# Patient Record
Sex: Female | Born: 1965 | Race: Black or African American | Hispanic: No | Marital: Single | Smoking: Never smoker
Health system: Southern US, Community
[De-identification: ages and names within clinical notes are randomized; demographics above are authoritative.]

## PROBLEM LIST (undated history)

## (undated) DIAGNOSIS — R7303 Prediabetes: Secondary | ICD-10-CM

## (undated) DIAGNOSIS — T4145XA Adverse effect of unspecified anesthetic, initial encounter: Secondary | ICD-10-CM

## (undated) DIAGNOSIS — G4730 Sleep apnea, unspecified: Secondary | ICD-10-CM

## (undated) DIAGNOSIS — A599 Trichomoniasis, unspecified: Secondary | ICD-10-CM

## (undated) DIAGNOSIS — M1990 Unspecified osteoarthritis, unspecified site: Secondary | ICD-10-CM

## (undated) DIAGNOSIS — I272 Pulmonary hypertension, unspecified: Secondary | ICD-10-CM

## (undated) DIAGNOSIS — E785 Hyperlipidemia, unspecified: Secondary | ICD-10-CM

## (undated) DIAGNOSIS — F419 Anxiety disorder, unspecified: Secondary | ICD-10-CM

## (undated) DIAGNOSIS — T8859XA Other complications of anesthesia, initial encounter: Secondary | ICD-10-CM

## (undated) DIAGNOSIS — N183 Chronic kidney disease, stage 3 unspecified: Secondary | ICD-10-CM

## (undated) DIAGNOSIS — F32A Depression, unspecified: Secondary | ICD-10-CM

## (undated) DIAGNOSIS — I10 Essential (primary) hypertension: Secondary | ICD-10-CM

## (undated) DIAGNOSIS — I509 Heart failure, unspecified: Secondary | ICD-10-CM

## (undated) DIAGNOSIS — K219 Gastro-esophageal reflux disease without esophagitis: Secondary | ICD-10-CM

## (undated) DIAGNOSIS — Z8489 Family history of other specified conditions: Secondary | ICD-10-CM

## (undated) DIAGNOSIS — R0600 Dyspnea, unspecified: Secondary | ICD-10-CM

## (undated) DIAGNOSIS — E8809 Other disorders of plasma-protein metabolism, not elsewhere classified: Secondary | ICD-10-CM

## (undated) HISTORY — DX: Sleep apnea, unspecified: G47.30

## (undated) HISTORY — PX: OTHER SURGICAL HISTORY: SHX169

## (undated) HISTORY — DX: Trichomoniasis, unspecified: A59.9

## (undated) HISTORY — PX: CARPAL TUNNEL RELEASE: SHX101

---
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
## 1986-10-30 HISTORY — PX: LEG SURGERY: SHX1003

## 2014-02-27 DIAGNOSIS — Z8639 Personal history of other endocrine, nutritional and metabolic disease: Secondary | ICD-10-CM | POA: Insufficient documentation

## 2014-06-17 DIAGNOSIS — E8809 Other disorders of plasma-protein metabolism, not elsewhere classified: Secondary | ICD-10-CM | POA: Insufficient documentation

## 2015-02-04 DIAGNOSIS — Z5900 Homelessness unspecified: Secondary | ICD-10-CM | POA: Insufficient documentation

## 2015-07-11 DIAGNOSIS — E559 Vitamin D deficiency, unspecified: Secondary | ICD-10-CM | POA: Insufficient documentation

## 2019-09-09 ENCOUNTER — Other Ambulatory Visit: Payer: Self-pay | Admitting: Family Medicine

## 2019-09-09 DIAGNOSIS — Z1231 Encounter for screening mammogram for malignant neoplasm of breast: Secondary | ICD-10-CM

## 2019-10-01 ENCOUNTER — Other Ambulatory Visit: Payer: Self-pay | Admitting: Gastroenterology

## 2019-10-29 ENCOUNTER — Inpatient Hospital Stay (HOSPITAL_COMMUNITY)
Admission: EM | Admit: 2019-10-29 | Discharge: 2019-11-03 | DRG: 177 | Disposition: A | Discharge status: Home / Self Care | Payer: Medicare Other | Attending: Internal Medicine | Admitting: Internal Medicine

## 2019-10-29 ENCOUNTER — Encounter (HOSPITAL_COMMUNITY): Payer: Self-pay

## 2019-10-29 ENCOUNTER — Emergency Department (HOSPITAL_COMMUNITY): Payer: Medicare Other

## 2019-10-29 ENCOUNTER — Other Ambulatory Visit: Payer: Self-pay

## 2019-10-29 DIAGNOSIS — J9601 Acute respiratory failure with hypoxia: Secondary | ICD-10-CM | POA: Diagnosis present

## 2019-10-29 DIAGNOSIS — M109 Gout, unspecified: Secondary | ICD-10-CM | POA: Diagnosis present

## 2019-10-29 DIAGNOSIS — U071 COVID-19: Secondary | ICD-10-CM | POA: Diagnosis not present

## 2019-10-29 DIAGNOSIS — Z9981 Dependence on supplemental oxygen: Secondary | ICD-10-CM

## 2019-10-29 DIAGNOSIS — N183 Chronic kidney disease, stage 3 unspecified: Secondary | ICD-10-CM | POA: Diagnosis present

## 2019-10-29 DIAGNOSIS — I509 Heart failure, unspecified: Secondary | ICD-10-CM

## 2019-10-29 DIAGNOSIS — I272 Pulmonary hypertension, unspecified: Secondary | ICD-10-CM | POA: Diagnosis present

## 2019-10-29 DIAGNOSIS — R0602 Shortness of breath: Secondary | ICD-10-CM | POA: Diagnosis not present

## 2019-10-29 DIAGNOSIS — J1282 Pneumonia due to coronavirus disease 2019: Secondary | ICD-10-CM | POA: Diagnosis present

## 2019-10-29 DIAGNOSIS — J9600 Acute respiratory failure, unspecified whether with hypoxia or hypercapnia: Secondary | ICD-10-CM | POA: Diagnosis present

## 2019-10-29 DIAGNOSIS — Z87891 Personal history of nicotine dependence: Secondary | ICD-10-CM

## 2019-10-29 DIAGNOSIS — Z91048 Other nonmedicinal substance allergy status: Secondary | ICD-10-CM

## 2019-10-29 DIAGNOSIS — Z888 Allergy status to other drugs, medicaments and biological substances status: Secondary | ICD-10-CM

## 2019-10-29 DIAGNOSIS — J9621 Acute and chronic respiratory failure with hypoxia: Secondary | ICD-10-CM | POA: Diagnosis present

## 2019-10-29 DIAGNOSIS — N1832 Chronic kidney disease, stage 3b: Secondary | ICD-10-CM | POA: Diagnosis present

## 2019-10-29 DIAGNOSIS — Z825 Family history of asthma and other chronic lower respiratory diseases: Secondary | ICD-10-CM

## 2019-10-29 DIAGNOSIS — I130 Hypertensive heart and chronic kidney disease with heart failure and stage 1 through stage 4 chronic kidney disease, or unspecified chronic kidney disease: Secondary | ICD-10-CM | POA: Diagnosis present

## 2019-10-29 DIAGNOSIS — Z86711 Personal history of pulmonary embolism: Secondary | ICD-10-CM

## 2019-10-29 DIAGNOSIS — Z6841 Body Mass Index (BMI) 40.0 and over, adult: Secondary | ICD-10-CM

## 2019-10-29 HISTORY — DX: Pulmonary hypertension, unspecified: I27.20

## 2019-10-29 HISTORY — DX: Essential (primary) hypertension: I10

## 2019-10-29 HISTORY — DX: Chronic kidney disease, stage 3 unspecified: N18.30

## 2019-10-29 LAB — CBC WITH DIFFERENTIAL/PLATELET
Abs Immature Granulocytes: 0.02 10*3/uL (ref 0.00–0.07)
Basophils Absolute: 0 10*3/uL (ref 0.0–0.1)
Basophils Relative: 1 %
Eosinophils Absolute: 0.1 10*3/uL (ref 0.0–0.5)
Eosinophils Relative: 3 %
HCT: 48.5 % — ABNORMAL HIGH (ref 36.0–46.0)
Hemoglobin: 15.1 g/dL — ABNORMAL HIGH (ref 12.0–15.0)
Immature Granulocytes: 1 %
Lymphocytes Relative: 25 %
Lymphs Abs: 0.9 10*3/uL (ref 0.7–4.0)
MCH: 30.3 pg (ref 26.0–34.0)
MCHC: 31.1 g/dL (ref 30.0–36.0)
MCV: 97.4 fL (ref 80.0–100.0)
Monocytes Absolute: 0.6 10*3/uL (ref 0.1–1.0)
Monocytes Relative: 18 %
Neutro Abs: 1.8 10*3/uL (ref 1.7–7.7)
Neutrophils Relative %: 52 %
Platelets: 190 10*3/uL (ref 150–400)
RBC: 4.98 MIL/uL (ref 3.87–5.11)
RDW: 16.2 % — ABNORMAL HIGH (ref 11.5–15.5)
WBC: 3.4 10*3/uL — ABNORMAL LOW (ref 4.0–10.5)
nRBC: 0 % (ref 0.0–0.2)

## 2019-10-29 LAB — COMPREHENSIVE METABOLIC PANEL
ALT: 13 U/L (ref 0–44)
AST: 17 U/L (ref 15–41)
Albumin: 3.2 g/dL — ABNORMAL LOW (ref 3.5–5.0)
Alkaline Phosphatase: 90 U/L (ref 38–126)
Anion gap: 10 (ref 5–15)
BUN: 24 mg/dL — ABNORMAL HIGH (ref 6–20)
CO2: 23 mmol/L (ref 22–32)
Calcium: 9.1 mg/dL (ref 8.9–10.3)
Chloride: 108 mmol/L (ref 98–111)
Creatinine, Ser: 1.71 mg/dL — ABNORMAL HIGH (ref 0.44–1.00)
GFR calc Af Amer: 39 mL/min — ABNORMAL LOW (ref >60)
GFR calc non Af Amer: 34 mL/min — ABNORMAL LOW (ref >60)
Glucose, Bld: 99 mg/dL (ref 70–99)
Potassium: 3.8 mmol/L (ref 3.5–5.1)
Sodium: 141 mmol/L (ref 135–145)
Total Bilirubin: 0.3 mg/dL (ref 0.3–1.2)
Total Protein: 6.2 g/dL — ABNORMAL LOW (ref 6.5–8.1)

## 2019-10-29 LAB — I-STAT BETA HCG BLOOD, ED (MC, WL, AP ONLY): I-stat hCG, quantitative: <5 m[IU]/mL (ref <5)

## 2019-10-29 LAB — TROPONIN I (HIGH SENSITIVITY)
Troponin I (High Sensitivity): 10 ng/L (ref <18)
Troponin I (High Sensitivity): 6 ng/L (ref <18)

## 2019-10-29 LAB — TRIGLYCERIDES: Triglycerides: 137 mg/dL (ref <150)

## 2019-10-29 LAB — LACTIC ACID, PLASMA: Lactic Acid, Venous: 0.9 mmol/L (ref 0.5–1.9)

## 2019-10-29 LAB — FIBRINOGEN: Fibrinogen: 412 mg/dL (ref 210–475)

## 2019-10-29 LAB — PROCALCITONIN: Procalcitonin: <0.1 ng/mL

## 2019-10-29 LAB — POC SARS CORONAVIRUS 2 AG -  ED: SARS Coronavirus 2 Ag: NEGATIVE

## 2019-10-29 LAB — D-DIMER, QUANTITATIVE: D-Dimer, Quant: 1.49 ug/mL-FEU — ABNORMAL HIGH (ref 0.00–0.50)

## 2019-10-29 LAB — LACTATE DEHYDROGENASE: LDH: 202 U/L — ABNORMAL HIGH (ref 98–192)

## 2019-10-29 MED ORDER — ALBUTEROL SULFATE (2.5 MG/3ML) 0.083% IN NEBU
5.0000 mg | INHALATION_SOLUTION | Freq: Once | RESPIRATORY_TRACT | Status: AC
  Administered 2019-10-29: 5 mg via RESPIRATORY_TRACT
  Filled 2019-10-29: qty 6

## 2019-10-29 MED ORDER — FENTANYL CITRATE (PF) 100 MCG/2ML IJ SOLN
50.0000 ug | Freq: Once | INTRAMUSCULAR | Status: AC
  Administered 2019-10-29: 50 ug via INTRAVENOUS
  Filled 2019-10-29: qty 2

## 2019-10-29 MED ORDER — DEXAMETHASONE SODIUM PHOSPHATE 10 MG/ML IJ SOLN
6.0000 mg | Freq: Once | INTRAMUSCULAR | Status: AC
  Administered 2019-10-29: 6 mg via INTRAVENOUS
  Filled 2019-10-29: qty 1

## 2019-10-29 MED ORDER — FUROSEMIDE 10 MG/ML IJ SOLN
40.0000 mg | Freq: Once | INTRAMUSCULAR | Status: AC
  Administered 2019-10-29: 40 mg via INTRAVENOUS
  Filled 2019-10-29: qty 4

## 2019-10-29 NOTE — ED Provider Notes (Signed)
[place] [facility] EMERGENCY DEPARTMENT Provider Note   CSN: [id number] Arrival date & time: 10/29/19  1647     History Chief Complaint  Patient presents with  . Shortness of Breath    Dawn Fowler is a 53 y.o. female.  Patient with a history of pulmonary hypertension and restrictive lung disease on home oxygen 4 L at baseline 8 L with exertion, hypertension, chronic kidney disease here with 2 days of shortness of breath, malaise, fatigue, cough and body aches.  States she had an outpatient Covid test today that was positive.  Has had sick contacts at home.  She does have oxygen at home but states the canister that she was using was about to run out.  She recently relocated and does not have a pulmonologist in the area.  She feels like her breathing is at baseline but her biggest complaint is fatigue with body aches and myalgias.  Denies fever.  Has a cough productive of white mucus and some streaks of blood.  Denies any leg pain or leg swelling.  No abdominal pain, vomiting, diarrhea.  No chest pain.  Does have some tingling in her left arm which has been fairly constant for several days.  No focal weakness.  The history is provided by the patient.  Shortness of Breath Associated symptoms: no abdominal pain, no chest pain, no cough, no fever, no headaches, no rash and no vomiting        Past Medical History:  Diagnosis Date  . CKD (chronic kidney disease), stage III   . HTN (hypertension)   . Pulmonary HTN ([place])     There are no problems to display for this patient.   History reviewed. No pertinent surgical history.   OB History   No obstetric history on file.     History reviewed. No pertinent family history.  Social History   Tobacco Use  . Smoking status: Former Research scientist (life sciences)  . Smokeless tobacco: Never Used  Substance Use Topics  . Alcohol use: Not Currently  . Drug use: Not Currently    Home Medications Prior to Admission medications   Medication  Sig Start Date End Date Taking? Authorizing Provider  allopurinol (ZYLOPRIM) 100 MG tablet Take 100 mg by mouth daily.    [provider]  ALYQ 20 MG tablet Take 20 mg by mouth daily. 09/24/19   [provider]  Cholecalciferol (VITAMIN D-3) 125 MCG (5000 UT) TABS Take 5,000 mg by mouth daily.    [provider]  colchicine 0.6 MG tablet Take 0.6 mg by mouth as needed (Gout).    [provider]  furosemide (LASIX) 40 MG tablet Take 40 mg by mouth 2 (two) times daily. 10/07/19   [provider]  NIFEdipine (PROCARDIA XL/NIFEDICAL XL) 60 MG 24 hr tablet Take 60 mg by mouth daily.    [provider]  OPSUMIT 10 MG tablet Take 10 mg by mouth daily. 09/22/19   [provider]  PREMARIN 0.625 MG tablet Take 0.625 mg by mouth daily. 09/01/19   [provider]  sildenafil (REVATIO) 20 MG tablet Take 20 mg by mouth daily.    [provider]  telmisartan (MICARDIS) 40 MG tablet Take 20 mg by mouth daily. 10/07/19   [provider]  tiotropium (SPIRIVA) 18 MCG inhalation capsule Place 18 mcg into inhaler and inhale daily.    [provider]    Allergies    Adhesive [tape] and Succinylcholine  Review of Systems  Review of Systems  Constitutional: Positive for activity change, appetite change and fatigue. Negative for fever.  HENT: Positive for congestion and rhinorrhea.   Respiratory: Positive for shortness of breath. Negative for cough and chest tightness.   Cardiovascular: Negative for chest pain and leg swelling.  Gastrointestinal: Negative for abdominal pain, nausea and vomiting.  Genitourinary: Negative for dysuria.  Musculoskeletal: Positive for arthralgias and myalgias.  Skin: Negative for rash.  Neurological: Positive for weakness. Negative for dizziness, light-headedness and headaches.   all other systems are negative except as noted in the HPI and PMH.    Physical Exam Updated Vital  Signs BP (!) 168/105 (BP Location: Right Arm)   Pulse 64   Temp 98.7 F (37.1 C) (Oral)   Resp 20   Ht _0  (1.549 m)   Wt 111.6 kg   SpO2 96%   BMI 46.48 kg/m   Physical Exam Vitals and nursing note reviewed.  Constitutional:      General: She is not in acute distress.    Appearance: She is well-developed. She is obese. She is not ill-appearing.     Comments: Appears comfortable, no significant respiratory distress, mild dyspnea with conversation  HENT:     Head: Normocephalic and atraumatic.     Mouth/Throat:     Pharynx: No oropharyngeal exudate.  Eyes:     Conjunctiva/sclera: Conjunctivae normal.     Pupils: Pupils are equal, round, and reactive to light.  Neck:     Comments: No meningismus. Cardiovascular:     Rate and Rhythm: Normal rate and regular rhythm.     Heart sounds: Normal heart sounds. No murmur.  Pulmonary:     Effort: Pulmonary effort is normal. No respiratory distress.     Breath sounds: Normal breath sounds. No wheezing.     Comments: Diminished breath sounds bilaterally Chest:     Chest wall: No tenderness.  Abdominal:     Palpations: Abdomen is soft.     Tenderness: There is no abdominal tenderness. There is no guarding or rebound.  Musculoskeletal:        General: No tenderness. Normal range of motion.     Cervical back: Normal range of motion and neck supple.  Skin:    General: Skin is warm.     Capillary Refill: Capillary refill takes less than 2 seconds.  Neurological:     General: No focal deficit present.     Mental Status: She is alert and oriented to person, place, and time. Mental status is at baseline.     Cranial Nerves: No cranial nerve deficit.     Motor: No abnormal muscle tone.     Coordination: Coordination normal.     Comments: No ataxia on finger to nose bilaterally. No pronator drift. 5/5 strength throughout. CN 2-12 intact.Equal grip strength. Sensation intact.   Psychiatric:        Behavior: Behavior normal.         ED Results / Procedures / Treatments   Labs (all labs ordered are listed, but only abnormal results are displayed) Labs Reviewed  CBC WITH DIFFERENTIAL/PLATELET - Abnormal; Notable for the following components:      Result Value   WBC 3.4 (*)    Hemoglobin 15.1 (*)    HCT 48.5 (*)    RDW 16.2 (*)    All other components within normal limits  COMPREHENSIVE METABOLIC PANEL - Abnormal; Notable for the following components:   BUN 24 (*)    Creatinine, Ser 1.71 (*)  Total Protein 6.2 (*)    Albumin 3.2 (*)    GFR calc non Af Amer 34 (*)    GFR calc Af Amer 39 (*)    All other components within normal limits  D-DIMER, QUANTITATIVE (NOT AT [facility]) - Abnormal; Notable for the following components:   D-Dimer, Quant 1.49 (*)    All other components within normal limits  LACTATE DEHYDROGENASE - Abnormal; Notable for the following components:   LDH 202 (*)    All other components within normal limits  CULTURE, BLOOD (ROUTINE X 2)  CULTURE, BLOOD (ROUTINE X 2)  LACTIC ACID, PLASMA  PROCALCITONIN  FIBRINOGEN  TRIGLYCERIDES  I-STAT BETA HCG BLOOD, ED (MC, [facility], AP ONLY)  POC SARS CORONAVIRUS 2 AG -  ED  TROPONIN I (HIGH SENSITIVITY)  TROPONIN I (HIGH SENSITIVITY)    EKG EKG Interpretation  Date/Time:  Wednesday October 29 2019 16:56:46 EST Ventricular Rate:  62 PR Interval:  166 QRS Duration: 88 QT Interval:  426 QTC Calculation: 432 R Axis:   55 Text Interpretation: Normal sinus rhythm Possible Anterior infarct , age undetermined Abnormal ECG No previous ECGs available Confirmed by Ezequiel Essex [phone]) on 10/29/2019 8:10:59 PM   Radiology DG Chest Port 1 View  Result Date: 10/29/2019 CLINICAL DATA:  Shortness of breath EXAM: PORTABLE CHEST 1 VIEW COMPARISON:  None. FINDINGS: There is significant cardiomegaly. There are no large focal infiltrates. There is vascular congestion without overt pulmonary edema. The pulmonary arteries are dilated. There is no  pneumothorax. No significant pleural effusion. No acute osseous abnormality. IMPRESSION: 1. Marked cardiomegaly with vascular congestion. 2. Dilated pulmonary arteries which can be seen in patients with elevated pulmonary artery pressures. 3. No large focal infiltrate.  No pleural effusion or pneumothorax. Electronically Signed   By: Constance Holster M.D.   On: 10/29/2019 19:21    Procedures .Critical Care Performed by: Ezequiel Essex, MD Authorized by: Ezequiel Essex, MD   Critical care provider statement:    Critical care time (minutes):  35   Critical care was necessary to treat or prevent imminent or life-threatening deterioration of the following conditions:  Respiratory failure   Critical care was time spent personally by me on the following activities:  Discussions with consultants, evaluation of patient's response to treatment, examination of patient, ordering and performing treatments and interventions, ordering and review of laboratory studies, ordering and review of radiographic studies, pulse oximetry, re-evaluation of patient's condition, obtaining history from patient or surrogate and review of old charts   (including critical care time)  Medications Ordered in ED Medications  albuterol (PROVENTIL) (2.5 MG/3ML) 0.083% nebulizer solution 5 mg (5 mg Nebulization Given 10/29/19 1905)  fentaNYL (SUBLIMAZE) injection 50 mcg (50 mcg Intravenous Given 10/29/19 2109)  furosemide (LASIX) injection 40 mg (40 mg Intravenous Given 10/29/19 2103)  dexamethasone (DECADRON) injection 6 mg (6 mg Intravenous Given 10/29/19 2101)    ED Course  I have reviewed the triage vital signs and the nursing notes.  Pertinent labs & imaging results that were available during my care of the patient were reviewed by me and considered in my medical decision making (see chart for details).  Clinical Course as of Oct 29 99  Wed Oct 29, 2019  2312 POC SARS Coronavirus 2 Ag-ED - Nasal Swab (BD  Veritor Kit) [RD]    Clinical Course User Index [RD] Lucrezia Starch, MD   MDM Rules/Calculators/A&P  Patient with interstitial lung disease and pulmonary hypertension on home oxygen with known coronavirus presenting with shortness of breath, fatigue, body aches and generalized weakness for the past 2 days.  She is saturating 89% on her usual 4 L.  She increased to 8 L with exertion.  She has mild dyspnea with conversation.  Diminished breath sounds with crackles.  Chest x-ray shows cardiomegaly with vascular congestion.  States compliance with her 40 mg of Lasix daily.  Will she get IV Lasix as well as IV Decadron given her known coronavirus test.  Patient does become quite dyspneic with ambulation been and hypoxic despite her home oxygen.  Did desaturate to 79% on her home 8 L with ambulation.  At rest she is mildly dyspneic and appears to be somewhat short of breath but not hypoxic.  She is given Lasix as well as Decadron for presumed multifactorial respiratory failure due to coronavirus as well as some CHF.  Inflammatory markers are reassuring. D-dimer 1.49> creatinine 1.7, unknown baseline. Will avoid IV contrast and CTPE at this time given her alternative diagnoses and mildly elevated D-dimer.   Patient agreeable to admission.  D/w D. Hal Hope   Hessie Dibble was evaluated in Emergency Department on 10/29/2019 for the symptoms described in the history of present illness. She was evaluated in the context of the global COVID-19 pandemic, which necessitated consideration that the patient might be at risk for infection with the SARS-CoV-2 virus that causes COVID-19. Institutional protocols and algorithms that pertain to the evaluation of patients at risk for COVID-19 are in a state of rapid change based on information released by regulatory bodies including the CDC and federal and state organizations. These policies and algorithms were followed during the  patient's care in the ED.  Final Clinical Impression(s) / ED Diagnoses Final diagnoses:  Acute respiratory failure with hypoxia ([place])  Acute congestive heart failure, unspecified heart failure type [facility])    Rx / DC Orders ED Discharge Orders    None       Oberon Hehir, Annie Main, MD 10/30/19 0102

## 2019-10-29 NOTE — ED Triage Notes (Signed)
Pt BIB [facility] for eval of COVID +, SOB, malaise x 2 days. Pt wears home O2 @ 4-8 LPM exertionally dependent. EMS reports pt denies acute complaints, but reports home O2 ran out and they called EMS for that reason. Pt on O2 tank on arrival.

## 2019-10-29 NOTE — ED Notes (Signed)
Pt. Started with 92% sitting in the bed prior to ambulation. Once started walking SpO2 and 6 liters of O2, sat's ranged from 94-100%, with a few seconds of 91%.  Walked back and forth multiple times, sat's stayed the same throughout.  When pt. Stopped walking and sat back on bed, sat's dropped down to 79% with good Pleth wave, turned O2 back up to 8L, and waited a few minutes until her sat's got back to 94%.  Pt. Is very exhausted and very short of breath, but turned O2 back down to 6L, pt remains at 94-96%.

## 2019-10-30 ENCOUNTER — Other Ambulatory Visit: Payer: Self-pay

## 2019-10-30 ENCOUNTER — Encounter (HOSPITAL_COMMUNITY): Payer: Self-pay | Admitting: Internal Medicine

## 2019-10-30 DIAGNOSIS — Z86711 Personal history of pulmonary embolism: Secondary | ICD-10-CM | POA: Diagnosis not present

## 2019-10-30 DIAGNOSIS — N183 Chronic kidney disease, stage 3 unspecified: Secondary | ICD-10-CM | POA: Diagnosis present

## 2019-10-30 DIAGNOSIS — I130 Hypertensive heart and chronic kidney disease with heart failure and stage 1 through stage 4 chronic kidney disease, or unspecified chronic kidney disease: Secondary | ICD-10-CM | POA: Diagnosis present

## 2019-10-30 DIAGNOSIS — Z825 Family history of asthma and other chronic lower respiratory diseases: Secondary | ICD-10-CM | POA: Diagnosis not present

## 2019-10-30 DIAGNOSIS — M109 Gout, unspecified: Secondary | ICD-10-CM | POA: Diagnosis present

## 2019-10-30 DIAGNOSIS — J9601 Acute respiratory failure with hypoxia: Secondary | ICD-10-CM | POA: Insufficient documentation

## 2019-10-30 DIAGNOSIS — U071 COVID-19: Secondary | ICD-10-CM | POA: Diagnosis present

## 2019-10-30 DIAGNOSIS — I509 Heart failure, unspecified: Secondary | ICD-10-CM | POA: Diagnosis present

## 2019-10-30 DIAGNOSIS — Z91048 Other nonmedicinal substance allergy status: Secondary | ICD-10-CM | POA: Diagnosis not present

## 2019-10-30 DIAGNOSIS — N1832 Chronic kidney disease, stage 3b: Secondary | ICD-10-CM | POA: Diagnosis present

## 2019-10-30 DIAGNOSIS — J1282 Pneumonia due to coronavirus disease 2019: Secondary | ICD-10-CM | POA: Diagnosis present

## 2019-10-30 DIAGNOSIS — Z9981 Dependence on supplemental oxygen: Secondary | ICD-10-CM | POA: Diagnosis not present

## 2019-10-30 DIAGNOSIS — Z87891 Personal history of nicotine dependence: Secondary | ICD-10-CM | POA: Diagnosis not present

## 2019-10-30 DIAGNOSIS — J9621 Acute and chronic respiratory failure with hypoxia: Secondary | ICD-10-CM | POA: Diagnosis present

## 2019-10-30 DIAGNOSIS — J9600 Acute respiratory failure, unspecified whether with hypoxia or hypercapnia: Secondary | ICD-10-CM | POA: Diagnosis not present

## 2019-10-30 DIAGNOSIS — Z888 Allergy status to other drugs, medicaments and biological substances status: Secondary | ICD-10-CM | POA: Diagnosis not present

## 2019-10-30 DIAGNOSIS — R0602 Shortness of breath: Secondary | ICD-10-CM | POA: Diagnosis present

## 2019-10-30 DIAGNOSIS — I272 Pulmonary hypertension, unspecified: Secondary | ICD-10-CM | POA: Diagnosis present

## 2019-10-30 DIAGNOSIS — Z6841 Body Mass Index (BMI) 40.0 and over, adult: Secondary | ICD-10-CM | POA: Diagnosis not present

## 2019-10-30 HISTORY — DX: COVID-19: U07.1

## 2019-10-30 HISTORY — DX: Acute respiratory failure, unspecified whether with hypoxia or hypercapnia: J96.00

## 2019-10-30 LAB — COMPREHENSIVE METABOLIC PANEL
ALT: 15 U/L (ref 0–44)
ALT: 16 U/L (ref 0–44)
AST: 19 U/L (ref 15–41)
AST: 18 U/L (ref 15–41)
Albumin: 3.5 g/dL (ref 3.5–5.0)
Albumin: 3.7 g/dL (ref 3.5–5.0)
Alkaline Phosphatase: 100 U/L (ref 38–126)
Alkaline Phosphatase: 96 U/L (ref 38–126)
Anion gap: 12 (ref 5–15)
Anion gap: 14 (ref 5–15)
BUN: 27 mg/dL — ABNORMAL HIGH (ref 6–20)
BUN: 28 mg/dL — ABNORMAL HIGH (ref 6–20)
CO2: 22 mmol/L (ref 22–32)
CO2: 23 mmol/L (ref 22–32)
Calcium: 9.4 mg/dL (ref 8.9–10.3)
Calcium: 9.2 mg/dL (ref 8.9–10.3)
Chloride: 106 mmol/L (ref 98–111)
Chloride: 103 mmol/L (ref 98–111)
Creatinine, Ser: 1.58 mg/dL — ABNORMAL HIGH (ref 0.44–1.00)
Creatinine, Ser: 1.52 mg/dL — ABNORMAL HIGH (ref 0.44–1.00)
GFR calc Af Amer: 43 mL/min — ABNORMAL LOW (ref >60)
GFR calc Af Amer: 45 mL/min — ABNORMAL LOW (ref >60)
GFR calc non Af Amer: 37 mL/min — ABNORMAL LOW (ref >60)
GFR calc non Af Amer: 39 mL/min — ABNORMAL LOW (ref >60)
Glucose, Bld: 171 mg/dL — ABNORMAL HIGH (ref 70–99)
Glucose, Bld: 191 mg/dL — ABNORMAL HIGH (ref 70–99)
Potassium: 4 mmol/L (ref 3.5–5.1)
Potassium: 3.9 mmol/L (ref 3.5–5.1)
Sodium: 140 mmol/L (ref 135–145)
Sodium: 140 mmol/L (ref 135–145)
Total Bilirubin: 0.2 mg/dL — ABNORMAL LOW (ref 0.3–1.2)
Total Bilirubin: 0.5 mg/dL (ref 0.3–1.2)
Total Protein: 6.9 g/dL (ref 6.5–8.1)
Total Protein: 7.2 g/dL (ref 6.5–8.1)

## 2019-10-30 LAB — TROPONIN I (HIGH SENSITIVITY)
Troponin I (High Sensitivity): 7 ng/L (ref <18)
Troponin I (High Sensitivity): 6 ng/L (ref <18)

## 2019-10-30 LAB — CBC WITH DIFFERENTIAL/PLATELET
Abs Immature Granulocytes: 0.01 10*3/uL (ref 0.00–0.07)
Abs Immature Granulocytes: 0.01 10*3/uL (ref 0.00–0.07)
Basophils Absolute: 0 10*3/uL (ref 0.0–0.1)
Basophils Absolute: 0 10*3/uL (ref 0.0–0.1)
Basophils Relative: 1 %
Basophils Relative: 1 %
Eosinophils Absolute: 0 10*3/uL (ref 0.0–0.5)
Eosinophils Absolute: 0 10*3/uL (ref 0.0–0.5)
Eosinophils Relative: 0 %
Eosinophils Relative: 0 %
HCT: 51.2 % — ABNORMAL HIGH (ref 36.0–46.0)
HCT: 48.8 % — ABNORMAL HIGH (ref 36.0–46.0)
Hemoglobin: 16.2 g/dL — ABNORMAL HIGH (ref 12.0–15.0)
Hemoglobin: 15.2 g/dL — ABNORMAL HIGH (ref 12.0–15.0)
Immature Granulocytes: 0 %
Immature Granulocytes: 0 %
Lymphocytes Relative: 14 %
Lymphocytes Relative: 24 %
Lymphs Abs: 0.6 10*3/uL — ABNORMAL LOW (ref 0.7–4.0)
Lymphs Abs: 0.7 10*3/uL (ref 0.7–4.0)
MCH: 30.2 pg (ref 26.0–34.0)
MCH: 30 pg (ref 26.0–34.0)
MCHC: 31.6 g/dL (ref 30.0–36.0)
MCHC: 31.1 g/dL (ref 30.0–36.0)
MCV: 95.5 fL (ref 80.0–100.0)
MCV: 96.3 fL (ref 80.0–100.0)
Monocytes Absolute: 0.1 10*3/uL (ref 0.1–1.0)
Monocytes Absolute: 0.1 10*3/uL (ref 0.1–1.0)
Monocytes Relative: 2 %
Monocytes Relative: 4 %
Neutro Abs: 3.4 10*3/uL (ref 1.7–7.7)
Neutro Abs: 2.1 10*3/uL (ref 1.7–7.7)
Neutrophils Relative %: 83 %
Neutrophils Relative %: 71 %
Platelets: 196 10*3/uL (ref 150–400)
Platelets: 212 10*3/uL (ref 150–400)
RBC: 5.36 MIL/uL — ABNORMAL HIGH (ref 3.87–5.11)
RBC: 5.07 MIL/uL (ref 3.87–5.11)
RDW: 16.2 % — ABNORMAL HIGH (ref 11.5–15.5)
RDW: 16.3 % — ABNORMAL HIGH (ref 11.5–15.5)
WBC: 4.1 10*3/uL (ref 4.0–10.5)
WBC: 2.9 10*3/uL — ABNORMAL LOW (ref 4.0–10.5)
nRBC: 0 % (ref 0.0–0.2)
nRBC: 0 % (ref 0.0–0.2)

## 2019-10-30 LAB — HIV ANTIBODY (ROUTINE TESTING W REFLEX): HIV Screen 4th Generation wRfx: NONREACTIVE

## 2019-10-30 LAB — C-REACTIVE PROTEIN
CRP: 2.3 mg/dL — ABNORMAL HIGH (ref <1.0)
CRP: 2.3 mg/dL — ABNORMAL HIGH (ref <1.0)

## 2019-10-30 LAB — FERRITIN: Ferritin: 83 ng/mL (ref 11–307)

## 2019-10-30 LAB — ABO/RH: ABO/RH(D): A POS

## 2019-10-30 LAB — BRAIN NATRIURETIC PEPTIDE: B Natriuretic Peptide: 102.3 pg/mL — ABNORMAL HIGH (ref 0.0–100.0)

## 2019-10-30 LAB — D-DIMER, QUANTITATIVE: D-Dimer, Quant: 1.59 ug/mL-FEU — ABNORMAL HIGH (ref 0.00–0.50)

## 2019-10-30 MED ORDER — FUROSEMIDE 20 MG PO TABS
40.0000 mg | ORAL_TABLET | Freq: Two times a day (BID) | ORAL | Status: DC
  Administered 2019-10-30 – 2019-11-03 (×9): 40 mg via ORAL
  Filled 2019-10-30 (×9): qty 2

## 2019-10-30 MED ORDER — ENOXAPARIN SODIUM 40 MG/0.4ML ~~LOC~~ SOLN
40.0000 mg | Freq: Every day | SUBCUTANEOUS | Status: DC
  Administered 2019-10-30: 40 mg via SUBCUTANEOUS
  Filled 2019-10-30: qty 0.4

## 2019-10-30 MED ORDER — IRBESARTAN 75 MG PO TABS
75.0000 mg | ORAL_TABLET | Freq: Every day | ORAL | Status: DC
  Administered 2019-10-30 – 2019-11-03 (×5): 75 mg via ORAL
  Filled 2019-10-30: qty 0.5
  Filled 2019-10-30 (×4): qty 1

## 2019-10-30 MED ORDER — DEXAMETHASONE SODIUM PHOSPHATE 10 MG/ML IJ SOLN
6.0000 mg | INTRAMUSCULAR | Status: DC
  Administered 2019-10-30 – 2019-11-01 (×3): 6 mg via INTRAVENOUS
  Filled 2019-10-30 (×3): qty 1

## 2019-10-30 MED ORDER — ENOXAPARIN SODIUM 60 MG/0.6ML ~~LOC~~ SOLN
0.5000 mg/kg | Freq: Every day | SUBCUTANEOUS | Status: DC
  Administered 2019-10-31 – 2019-11-03 (×4): 55 mg via SUBCUTANEOUS
  Filled 2019-10-30 (×4): qty 0.6

## 2019-10-30 MED ORDER — TRAMADOL HCL 50 MG PO TABS
50.0000 mg | ORAL_TABLET | Freq: Four times a day (QID) | ORAL | Status: DC | PRN
  Administered 2019-10-30: 50 mg via ORAL
  Filled 2019-10-30: qty 1

## 2019-10-30 MED ORDER — MACITENTAN 10 MG PO TABS
10.0000 mg | ORAL_TABLET | Freq: Every day | ORAL | Status: DC
  Administered 2019-10-30 – 2019-11-03 (×5): 10 mg via ORAL
  Filled 2019-10-30 (×5): qty 1

## 2019-10-30 MED ORDER — SODIUM CHLORIDE 0.9 % IV SOLN
100.0000 mg | Freq: Every day | INTRAVENOUS | Status: AC
  Administered 2019-10-31 – 2019-11-03 (×4): 100 mg via INTRAVENOUS
  Filled 2019-10-30 (×4): qty 20

## 2019-10-30 MED ORDER — SILDENAFIL CITRATE 20 MG PO TABS: 20.0000 mg | ORAL_TABLET | Freq: Every day | ORAL | Status: DC

## 2019-10-30 MED ORDER — SILDENAFIL CITRATE 20 MG PO TABS
20.0000 mg | ORAL_TABLET | Freq: Every day | ORAL | Status: DC
  Administered 2019-10-30 – 2019-11-03 (×5): 20 mg via ORAL
  Filled 2019-10-30 (×5): qty 1

## 2019-10-30 MED ORDER — ACETAMINOPHEN 325 MG PO TABS
650.0000 mg | ORAL_TABLET | Freq: Four times a day (QID) | ORAL | Status: DC | PRN
  Administered 2019-10-30 – 2019-10-31 (×2): 650 mg via ORAL
  Filled 2019-10-30 (×2): qty 2

## 2019-10-30 MED ORDER — ALLOPURINOL 100 MG PO TABS
100.0000 mg | ORAL_TABLET | Freq: Every day | ORAL | Status: DC
  Administered 2019-10-30 – 2019-11-03 (×5): 100 mg via ORAL
  Filled 2019-10-30 (×5): qty 1

## 2019-10-30 MED ORDER — UMECLIDINIUM BROMIDE 62.5 MCG/INH IN AEPB
1.0000 | INHALATION_SPRAY | Freq: Every day | RESPIRATORY_TRACT | Status: DC
  Administered 2019-10-30 – 2019-11-03 (×5): 1 via RESPIRATORY_TRACT
  Filled 2019-10-30: qty 7

## 2019-10-30 MED ORDER — NIFEDIPINE ER OSMOTIC RELEASE 60 MG PO TB24
60.0000 mg | ORAL_TABLET | Freq: Every day | ORAL | Status: DC
  Administered 2019-10-30 – 2019-11-03 (×5): 60 mg via ORAL
  Filled 2019-10-30 (×5): qty 1

## 2019-10-30 MED ORDER — SODIUM CHLORIDE 0.9 % IV SOLN
200.0000 mg | Freq: Once | INTRAVENOUS | Status: AC
  Administered 2019-10-30: 200 mg via INTRAVENOUS
  Filled 2019-10-30: qty 40

## 2019-10-30 MED ORDER — TADALAFIL (PAH) 20 MG PO TABS: 20.0000 mg | ORAL_TABLET | Freq: Every day | ORAL | Status: DC

## 2019-10-30 MED ORDER — TIOTROPIUM BROMIDE MONOHYDRATE 18 MCG IN CAPS: 18.0000 ug | ORAL_CAPSULE | Freq: Every day | RESPIRATORY_TRACT | Status: DC

## 2019-10-30 MED ORDER — VITAMIN D 25 MCG (1000 UNIT) PO TABS
5000.0000 [IU] | ORAL_TABLET | Freq: Every day | ORAL | Status: DC
  Administered 2019-10-30 – 2019-11-03 (×5): 5000 [IU] via ORAL
  Filled 2019-10-30 (×5): qty 5

## 2019-10-30 MED ORDER — ACETAMINOPHEN 650 MG RE SUPP: 650.0000 mg | Freq: Four times a day (QID) | RECTAL | Status: DC | PRN

## 2019-10-30 NOTE — H&P (Signed)
History and Physical    Dawn Fowler OJJ:[id number] DOB: May 18, 1966 DOA: 10/29/2019  PCP: Hayden Rasmussen, MD  Patient coming from: Home.  Chief Complaint: Shortness of breath.  HPI: Dawn Fowler is a 53 y.o. female with history of pulmonary hypertension, hypertension, interstitial lung disease chronic kidney disease stage III morbid obesity presents to the ER with complaints of increasing shortness of breath.  Patient states her shortness of breath worsened over the last 1 week and has been taking more rest than usual.  Denies any chest pain has been having some nonproductive cough has been a subjective feeling of fever chills.  No nausea vomiting or diarrhea.  Patient usually requires 4 L oxygen on rest and 8 L at exertion.  Patient just recently moved to [facility] and usually gets her care from [facility].  ED Course: In the ER patient was hypoxic at 8 L chest x-ray showing congestion and patient had Covid test done which was positive results of which are available on ER physicians notes.  Patient had labs drawn which shows BNP of 102.3 LDH 202 CRP 2.3 ferritin 83 creatinine 1.7 WBC 3.4.  Patient was given IV Lasix and started on IV remdesivir and Decadron admitted for acute respiratory failure.  Review of Systems: As per HPI, rest all negative.   Past Medical History:  Diagnosis Date  . CKD (chronic kidney disease), stage III   . HTN (hypertension)   . Pulmonary HTN ([place])     Past Surgical History:  Procedure Laterality Date  . CESAREAN SECTION    . lumbar surgery       reports that she has quit smoking. She has never used smokeless tobacco. She reports previous alcohol use. She reports previous drug use.  Allergies  Allergen Reactions  . Adhesive [Tape] Hives    Paper tape  . Succinylcholine Other (See Comments)    Have trouble waking up  . Tizanidine Hives    Family History  Problem Relation Age of Onset  . COPD Father   . Pulmonary Hypertension Neg Hx      Prior to Admission medications   Medication Sig Start Date End Date Taking? Authorizing Provider  allopurinol (ZYLOPRIM) 100 MG tablet Take 100 mg by mouth daily.   Yes [provider]  ALYQ 20 MG tablet Take 20 mg by mouth daily. 09/24/19  Yes [provider]  Cholecalciferol (VITAMIN D-3) 125 MCG (5000 UT) TABS Take 5,000 mg by mouth daily.   Yes [provider]  colchicine 0.6 MG tablet Take 0.6 mg by mouth as needed (Gout).   Yes [provider]  diclofenac Sodium (VOLTAREN) 1 % GEL Apply 2 g topically at bedtime. 10/20/19  Yes [provider]  furosemide (LASIX) 40 MG tablet Take 40 mg by mouth 2 (two) times daily. 10/07/19  Yes [provider]  NIFEdipine (PROCARDIA XL/NIFEDICAL XL) 60 MG 24 hr tablet Take 60 mg by mouth daily.   Yes [provider]  OPSUMIT 10 MG tablet Take 10 mg by mouth daily. 09/22/19  Yes [provider]  PREMARIN 0.625 MG tablet Take 0.625 mg by mouth daily. 09/01/19  Yes [provider]  sildenafil (REVATIO) 20 MG tablet Take 20 mg by mouth daily.   Yes [provider]  telmisartan (MICARDIS) 40 MG tablet Take 20 mg by mouth daily. 10/07/19  Yes [provider]  tiotropium (SPIRIVA) 18 MCG inhalation capsule Place 18 mcg into inhaler and inhale daily.   Yes [provider]  traMADol (ULTRAM) 50 MG tablet Take 50 mg by mouth every 6 (six) hours as needed for moderate pain.  10/20/19  Yes [provider]    Physical Exam: Constitutional: Moderately built and nourished. Vitals:   10/30/19 0030 10/30/19 0045 10/30/19 0100 10/30/19 0115  BP:      Pulse: 66 62 60 60  Resp: 18 20 20 19   Temp:      TempSrc:      SpO2: (!) 89% (!) 88% (!) 87% 90%  Weight:      Height:       Eyes: Anicteric no pallor. ENMT: No discharge from the ears eyes nose or mouth. Neck: No mass felt.  No neck rigidity. Respiratory: No rhonchi or  crepitations. Cardiovascular: S1-S2 heard. Abdomen: Soft nontender bowel sounds present. Musculoskeletal: No edema. Skin: No rash. Neurologic: Alert awake oriented time place and person.  Moves all extremities. Psychiatric: Appears normal per normal affect.   Labs on Admission: I have personally reviewed following labs and imaging studies  CBC: Recent Labs  Lab 10/29/19 1843  WBC 3.4*  NEUTROABS 1.8  HGB 15.1*  HCT 48.5*  MCV 97.4  PLT 785   Basic Metabolic Panel: Recent Labs  Lab 10/29/19 1843  NA 141  K 3.8  CL 108  CO2 23  GLUCOSE 99  BUN 24*  CREATININE 1.71*  CALCIUM 9.1   GFR: Estimated Creatinine Clearance: 44.5 mL/min (A) (by C-G formula based on SCr of 1.71 mg/dL (H)). Liver Function Tests: Recent Labs  Lab 10/29/19 1843  AST 17  ALT 13  ALKPHOS 90  BILITOT 0.3  PROT 6.2*  ALBUMIN 3.2*   No results for input(s): LIPASE, AMYLASE in the last 168 hours. No results for input(s): AMMONIA in the last 168 hours. Coagulation Profile: No results for input(s): INR, PROTIME in the last 168 hours. Cardiac Enzymes: No results for input(s): CKTOTAL, CKMB, CKMBINDEX, TROPONINI in the last 168 hours. BNP (last 3 results) No results for input(s): PROBNP in the last 8760 hours. HbA1C: No results for input(s): HGBA1C in the last 72 hours. CBG: No results for input(s): GLUCAP in the last 168 hours. Lipid Profile: Recent Labs    10/29/19 1843  TRIG 137   Thyroid Function Tests: No results for input(s): TSH, T4TOTAL, FREET4, T3FREE, THYROIDAB in the last 72 hours. Anemia Panel: No results for input(s): VITAMINB12, FOLATE, FERRITIN, TIBC, IRON, RETICCTPCT in the last 72 hours. Urine analysis: No results found for: COLORURINE, APPEARANCEUR, LABSPEC, PHURINE, GLUCOSEU, HGBUR, BILIRUBINUR, KETONESUR, PROTEINUR, UROBILINOGEN, NITRITE, LEUKOCYTESUR Sepsis Labs: @LABRCNTIP (procalcitonin:4,lacticidven:4) )No results found for this or any previous visit (from the  past 240 hour(s)).   Radiological Exams on Admission: DG Chest Port 1 View  Result Date: 10/29/2019 CLINICAL DATA:  Shortness of breath EXAM: PORTABLE CHEST 1 VIEW COMPARISON:  None. FINDINGS: There is significant cardiomegaly. There are no large focal infiltrates. There is vascular congestion without overt pulmonary edema. The pulmonary arteries are dilated. There is no pneumothorax. No significant pleural effusion. No acute osseous abnormality. IMPRESSION: 1. Marked cardiomegaly with vascular congestion. 2. Dilated pulmonary arteries which can be seen in patients with elevated pulmonary artery pressures. 3. No large focal infiltrate.  No pleural effusion or pneumothorax. Electronically Signed   By: Constance Holster M.D.   On: 10/29/2019 19:21    EKG: Independently reviewed.  Normal sinus rhythm with low voltage.  Assessment/Plan Principal Problem:   Acute respiratory failure due to COVID-19 [facility]) Active Problems:   Pulmonary hypertension (  [place])   Gout   CKD (chronic kidney disease) stage 3, GFR 30-59 ml/min    1. Acute respiratory failure with hypoxia likely a combination of patient's known history of pulmonary hypertension and Covid infection.  For which I have started patient IV Decadron IV remdesivir and did receive 1 dose of IV Lasix.  We will continue home dose of Lasix continue to monitor closely in stepdown unit follow respiratory status and inflammatory markers. 2. History of pulmonary hypertension on Lasix Alyq Opsumit. 3. Hypertension on Procardia and ARB. 4. History of gout on allopurinol.  5. History of provoked PE in 2016 after long distance travel.  Note that patient's COVID-19 infection results are only available on the ER physician's note.  Given that patient has acute respiratory failure will need inpatient status.   DVT prophylaxis: Lovenox. Code Status: Full code. Family Communication: Discussed with patient. Disposition Plan: Home. Consults called:  None. Admission status: Inpatient.   Rise Patience MD [facility] Pager [phone].  If 7PM-7AM, please contact night-coverage [email or web address] Password [email or web address]  10/30/2019, 1:20 AM

## 2019-10-30 NOTE — ED Notes (Signed)
[facility] called for pt to go to [facility]

## 2019-10-30 NOTE — Progress Notes (Signed)
Admitted earlier today.  H&P reviewed.  Patient seen and examined.  Patient mentions that she is feeling better.  She however did feel short of breath when she was ambulating.  She mentions that she uses up to 8 L of oxygen at home for her history of pulmonary hypertension.  Has been referred by her PCP to [facility] Pulmonology but has not seen anybody yet.  She recently moved here from [facility].  She was being followed by pulmonology at the [facility] there.   Vital signs noted to be stable.  She is currently on 6 L of oxygen saturating in the mid 90s.  Crackles heard bilateral lung bases.  No wheezing or rhonchi. S1-S2 is normal regular Abdomen is soft nontender nondistended She is alert and oriented x3.  No focal neurological deficit  Labs reviewed.  Troponin normal.  Leukopenia is noted.  CRP was 2.3.  Patient is COVID-19 positive test result is inside the ED provider note.  It was apparently done at an urgent care center.  Patient with COVID-19 and with worse respiratory symptoms than at baseline.  She does have a history of pulmonary hypertension and is on oxygen at home.  Chest x-ray did not show any clear-cut infiltrates however clinically she was thought to have pneumonia.  Patient was started on remdesivir.  She states that she is feeling well.  Slightly better than how she felt last night.  Continue with current management for now.  Continue with steroids.  Since patient is improving and is not requiring any more oxygen than what she usually uses at home we will not offer her experimental treatment with Actemra or convalescent plasma.  Continue with incentive spirometry, mobilization, out of bed to chair.  Rest of the issues as per H&P.  Bonnielee Haff 10/30/2019

## 2019-10-30 NOTE — ED Notes (Signed)
Report called to Larene Beach, Therapist, sports at [facility]

## 2019-10-31 LAB — COMPREHENSIVE METABOLIC PANEL
ALT: 14 U/L (ref 0–44)
AST: 17 U/L (ref 15–41)
Albumin: 3.1 g/dL — ABNORMAL LOW (ref 3.5–5.0)
Alkaline Phosphatase: 89 U/L (ref 38–126)
Anion gap: 11 (ref 5–15)
BUN: 32 mg/dL — ABNORMAL HIGH (ref 6–20)
CO2: 23 mmol/L (ref 22–32)
Calcium: 9.2 mg/dL (ref 8.9–10.3)
Chloride: 107 mmol/L (ref 98–111)
Creatinine, Ser: 1.38 mg/dL — ABNORMAL HIGH (ref 0.44–1.00)
GFR calc Af Amer: 51 mL/min — ABNORMAL LOW (ref >60)
GFR calc non Af Amer: 44 mL/min — ABNORMAL LOW (ref >60)
Glucose, Bld: 82 mg/dL (ref 70–99)
Potassium: 3.6 mmol/L (ref 3.5–5.1)
Sodium: 141 mmol/L (ref 135–145)
Total Bilirubin: 0.3 mg/dL (ref 0.3–1.2)
Total Protein: 6.6 g/dL (ref 6.5–8.1)

## 2019-10-31 LAB — CBC
HCT: 47.6 % — ABNORMAL HIGH (ref 36.0–46.0)
Hemoglobin: 15.2 g/dL — ABNORMAL HIGH (ref 12.0–15.0)
MCH: 30.1 pg (ref 26.0–34.0)
MCHC: 31.9 g/dL (ref 30.0–36.0)
MCV: 94.3 fL (ref 80.0–100.0)
Platelets: 241 10*3/uL (ref 150–400)
RBC: 5.05 MIL/uL (ref 3.87–5.11)
RDW: 16.1 % — ABNORMAL HIGH (ref 11.5–15.5)
WBC: 4.1 10*3/uL (ref 4.0–10.5)
nRBC: 0 % (ref 0.0–0.2)

## 2019-10-31 LAB — C-REACTIVE PROTEIN: CRP: 0.9 mg/dL (ref <1.0)

## 2019-10-31 LAB — MAGNESIUM: Magnesium: 2 mg/dL (ref 1.7–2.4)

## 2019-10-31 LAB — D-DIMER, QUANTITATIVE: D-Dimer, Quant: 1.07 ug/mL-FEU — ABNORMAL HIGH (ref 0.00–0.50)

## 2019-10-31 LAB — BRAIN NATRIURETIC PEPTIDE: B Natriuretic Peptide: 41.3 pg/mL (ref 0.0–100.0)

## 2019-10-31 MED ORDER — DIPHENHYDRAMINE HCL 12.5 MG/5ML PO ELIX
12.5000 mg | ORAL_SOLUTION | Freq: Four times a day (QID) | ORAL | Status: DC | PRN
  Administered 2019-10-31 – 2019-11-01 (×2): 12.5 mg via ORAL
  Filled 2019-10-31 (×3): qty 5

## 2019-10-31 NOTE — Progress Notes (Addendum)
PROGRESS NOTE                                                                                                                                                                                                             Patient Demographics:    Dawn Fowler, is a 54 y.o. female, DOB - Nov 08, 1965, OVF:[id number]  Outpatient Primary MD for the patient is Hayden Rasmussen, MD    LOS - 1  Admit date - 10/29/2019    Chief Complaint  Patient presents with  . Shortness of Breath       Brief Narrative   Dawn Fowler is a 54 y.o. female with history of pulmonary hypertension, hypertension, interstitial lung disease chronic kidney disease stage III morbid obesity presents to the ER with complaints of increasing shortness of breath, she was diagnosed with acute on chronic hypoxic respiratory failure due to COVID-19 pneumonia and admitted to the hospital.   Subjective:    Dawn Fowler today has, No headache, No chest pain, No abdominal pain - No Nausea, No new weakness tingling or numbness, mild SOB.   Assessment  & Plan :     1. Acute Hypoxic Resp. Failure due to Acute Covid 19 Viral Pneumonitis during the ongoing 2020 Covid 19 Pandemic -she has mild to moderate disease and seems to be stabilizing on IV steroids and remdesivir which will be continued.  Will monitor her closely.  Try to advance activity and titrate down oxygen as tolerated.  Her oxygen requirements are at or less than home requirements.  Encouraged the patient to sit up in chair in the daytime use I-S and flutter valve for pulmonary toiletry and then prone in bed when at night.  Actemra off label use - patient was told that if COVID-19 pneumonitis gets worse we might potentially use Actemra off label, patient denies any known history of tuberculosis or hepatitis, understands the risks and benefits and wants to proceed with Actemra treatment if required.     SpO2: 94 % O2 Flow Rate (L/min): 4 L/min  Recent Labs  Lab 10/29/19 1843 10/30/19 0050 10/30/19 0209 10/30/19 0550 10/31/19 0647  CRP  --  2.3* 2.3*  --  0.9  DDIMER 1.49*  --   --  1.59* 1.07*  FERRITIN  --   --  83  --   --  BNP  --   --  102.3*  --  41.3  PROCALCITON <0.10  --   --   --   --     Hepatic Function Latest Ref Rng & Units 10/31/2019 10/30/2019 10/30/2019  Total Protein 6.5 - 8.1 g/dL 6.6 7.2 6.9  Albumin 3.5 - 5.0 g/dL 3.1(L) 3.7 3.5  AST 15 - 41 U/L _0 ALT 0 - 44 U/L _1 Alk Phosphatase 38 - 126 U/L 89 96 100  Total Bilirubin 0.3 - 1.2 mg/dL 0.3 0.5 0.2(L)     2. History of pulmonary hypertension on Lasix & Opsumit.  Note she uses 4 to 5 L of nasal cannula oxygen at home.  3. Hypertension on Procardia and ARB.  4. History of gout on allopurinol.   5. History of provoked PE in 2016 after long distance travel.  Currently on prophylactic Lovenox.     Condition - Fair  Family Communication  :  None  Code Status : Full  Diet :   Diet Order            Diet Heart Room service appropriate? Yes; Fluid consistency: Thin; Fluid restriction: 1200 mL Fluid  Diet effective now               Disposition Plan  : Likely home in a few days  Consults  : None  Procedures  : None  PUD Prophylaxis : None  DVT Prophylaxis  :  Lovenox   Lab Results  Component Value Date   PLT 241 10/31/2019    Inpatient Medications  Scheduled Meds: . allopurinol  100 mg Oral Daily  . cholecalciferol  5,000 Units Oral Daily  . dexamethasone (DECADRON) injection  6 mg Intravenous Q24H  . enoxaparin (LOVENOX) injection  0.5 mg/kg Subcutaneous Daily  . furosemide  40 mg Oral BID  . irbesartan  75 mg Oral Daily  . macitentan  10 mg Oral Daily  . NIFEdipine  60 mg Oral Daily  . sildenafil  20 mg Oral Daily  . umeclidinium bromide  1 puff Inhalation Daily   Continuous Infusions: . remdesivir 100 mg in NS 100 mL 100 mg (10/31/19 1025)   PRN  Meds:.acetaminophen **OR** acetaminophen, diphenhydrAMINE, traMADol  Antibiotics  :    Anti-infectives (From admission, onward)   Start     Dose/Rate Route Frequency Ordered Stop   10/31/19 1000  remdesivir 100 mg in sodium chloride 0.9 % 100 mL IVPB     100 mg 200 mL/hr over 30 Minutes Intravenous Daily 10/30/19 0119 11/04/19 0959   10/30/19 0200  remdesivir 200 mg in sodium chloride 0.9% 250 mL IVPB     200 mg 580 mL/hr over 30 Minutes Intravenous Once 10/30/19 0119 10/30/19 0305       Time Spent in minutes  30   Lala Lund M.D on 10/31/2019 at 12:04 PM  To page go to [email or web address] - password [facility]  [facility] -  Office  [phone]   See all Orders from today for further details    Objective:   Vitals:   10/31/19 0000 10/31/19 0400 10/31/19 0731 10/31/19 1115  BP: (!) 155/102 (!) 153/96 (!) 136/111 (!) 154/111  Pulse: 70 (!) 58 (!) 57 67  Resp: _2 Temp: 98 F (36.7 C) 98.3 F (36.8 C) 97.7 F (36.5 C) (!) 97.5 F (36.4 C)  TempSrc:   Oral Oral  SpO2: 95% 92% 90% 94%  Weight:  Height:        Wt Readings from Last 3 Encounters:  10/29/19 111.6 kg     Intake/Output Summary (Last 24 hours) at 10/31/2019 1204 Last data filed at 10/31/2019 1153 Gross per 24 hour  Intake 705.2 ml  Output 2 ml  Net 703.2 ml     Physical Exam  Awake Alert, No new F.N deficits, Normal affect [place].AT,PERRAL Supple Neck,No JVD, No cervical lymphadenopathy appriciated.  Symmetrical Chest wall movement, Good air movement bilaterally, CTAB RRR,No Gallops,Rubs or new Murmurs, No Parasternal Heave +ve B.Sounds, Abd Soft, No tenderness, No organomegaly appriciated, No rebound - guarding or rigidity. No Cyanosis, Clubbing or edema, No new Rash or bruise      Data Review:    CBC Recent Labs  Lab 10/29/19 1843 10/30/19 0209 10/30/19 0550 10/31/19 0647  WBC 3.4* 4.1 2.9* 4.1  HGB 15.1* 16.2* 15.2* 15.2*  HCT 48.5* 51.2* 48.8* 47.6*  PLT 190 196 212 241   MCV 97.4 95.5 96.3 94.3  MCH 30.3 30.2 30.0 30.1  MCHC 31.1 31.6 31.1 31.9  RDW 16.2* 16.2* 16.3* 16.1*  LYMPHSABS 0.9 0.6* 0.7  --   MONOABS 0.6 0.1 0.1  --   EOSABS 0.1 0.0 0.0  --   BASOSABS 0.0 0.0 0.0  --     Chemistries  Recent Labs  Lab 10/29/19 1843 10/30/19 0209 10/30/19 0550 10/31/19 0647  NA 141 140 140 141  K 3.8 4.0 3.9 3.6  CL 108 106 103 107  CO2 _0 GLUCOSE 99 171* 191* 82  BUN 24* 27* 28* 32*  CREATININE 1.71* 1.58* 1.52* 1.38*  CALCIUM 9.1 9.4 9.2 9.2  MG  --   --   --  2.0  AST _1 ALT _2 ALKPHOS 90 100 96 89  BILITOT 0.3 0.2* 0.5 0.3   ------------------------------------------------------------------------------------------------------------------ Recent Labs    10/29/19 1843  TRIG 137    No results found for: HGBA1C ------------------------------------------------------------------------------------------------------------------ No results for input(s): TSH, T4TOTAL, T3FREE, THYROIDAB in the last 72 hours.  Invalid input(s): FREET3  Cardiac Enzymes No results for input(s): CKMB, TROPONINI, MYOGLOBIN in the last 168 hours.  Invalid input(s): CK ------------------------------------------------------------------------------------------------------------------    Component Value Date/Time   BNP 41.3 10/31/2019 0647    Micro Results Recent Results (from the past 240 hour(s))  Blood Culture (routine x 2)     Status: None (Preliminary result)   Collection Time: 10/29/19  7:24 PM   Specimen: BLOOD  Result Value Ref Range Status   Specimen Description BLOOD RIGHT ANTECUBITAL  Final   Special Requests   Final    BOTTLES DRAWN AEROBIC AND ANAEROBIC Blood Culture adequate volume   Culture   Final    NO GROWTH < 24 HOURS Performed at [facility], [address], [place], [place] [address]    Report Status PENDING  Incomplete  Blood Culture (routine x 2)     Status: None (Preliminary result)   Collection  Time: 10/29/19  9:06 PM   Specimen: BLOOD  Result Value Ref Range Status   Specimen Description BLOOD LEFT FOREARM  Final   Special Requests   Final    BOTTLES DRAWN AEROBIC AND ANAEROBIC Blood Culture adequate volume   Culture   Final    NO GROWTH < 24 HOURS Performed at [facility], [place] [address] [place], [place] [address]    Report Status PENDING  Incomplete  Radiology Reports DG Chest Port 1 View  Result Date: 10/29/2019 CLINICAL DATA:  Shortness of breath EXAM: PORTABLE CHEST 1 VIEW COMPARISON:  None. FINDINGS: There is significant cardiomegaly. There are no large focal infiltrates. There is vascular congestion without overt pulmonary edema. The pulmonary arteries are dilated. There is no pneumothorax. No significant pleural effusion. No acute osseous abnormality. IMPRESSION: 1. Marked cardiomegaly with vascular congestion. 2. Dilated pulmonary arteries which can be seen in patients with elevated pulmonary artery pressures. 3. No large focal infiltrate.  No pleural effusion or pneumothorax. Electronically Signed   By: Constance Holster M.D.   On: 10/29/2019 19:21

## 2019-11-01 LAB — CBC WITH DIFFERENTIAL/PLATELET
Abs Immature Granulocytes: 0.02 10*3/uL (ref 0.00–0.07)
Basophils Absolute: 0 10*3/uL (ref 0.0–0.1)
Basophils Relative: 0 %
Eosinophils Absolute: 0 10*3/uL (ref 0.0–0.5)
Eosinophils Relative: 0 %
HCT: 51.6 % — ABNORMAL HIGH (ref 36.0–46.0)
Hemoglobin: 16.5 g/dL — ABNORMAL HIGH (ref 12.0–15.0)
Immature Granulocytes: 0 %
Lymphocytes Relative: 39 %
Lymphs Abs: 2.2 10*3/uL (ref 0.7–4.0)
MCH: 30.2 pg (ref 26.0–34.0)
MCHC: 32 g/dL (ref 30.0–36.0)
MCV: 94.5 fL (ref 80.0–100.0)
Monocytes Absolute: 0.4 10*3/uL (ref 0.1–1.0)
Monocytes Relative: 7 %
Neutro Abs: 3 10*3/uL (ref 1.7–7.7)
Neutrophils Relative %: 54 %
Platelets: 241 10*3/uL (ref 150–400)
RBC: 5.46 MIL/uL — ABNORMAL HIGH (ref 3.87–5.11)
RDW: 16.1 % — ABNORMAL HIGH (ref 11.5–15.5)
WBC: 5.7 10*3/uL (ref 4.0–10.5)
nRBC: 0 % (ref 0.0–0.2)

## 2019-11-01 LAB — COMPREHENSIVE METABOLIC PANEL
ALT: 16 U/L (ref 0–44)
AST: 17 U/L (ref 15–41)
Albumin: 3.5 g/dL (ref 3.5–5.0)
Alkaline Phosphatase: 92 U/L (ref 38–126)
Anion gap: 12 (ref 5–15)
BUN: 34 mg/dL — ABNORMAL HIGH (ref 6–20)
CO2: 21 mmol/L — ABNORMAL LOW (ref 22–32)
Calcium: 9 mg/dL (ref 8.9–10.3)
Chloride: 104 mmol/L (ref 98–111)
Creatinine, Ser: 1.31 mg/dL — ABNORMAL HIGH (ref 0.44–1.00)
GFR calc Af Amer: 54 mL/min — ABNORMAL LOW (ref >60)
GFR calc non Af Amer: 47 mL/min — ABNORMAL LOW (ref >60)
Glucose, Bld: 89 mg/dL (ref 70–99)
Potassium: 3.7 mmol/L (ref 3.5–5.1)
Sodium: 137 mmol/L (ref 135–145)
Total Bilirubin: 0.4 mg/dL (ref 0.3–1.2)
Total Protein: 6.7 g/dL (ref 6.5–8.1)

## 2019-11-01 LAB — C-REACTIVE PROTEIN: CRP: 0.7 mg/dL (ref <1.0)

## 2019-11-01 LAB — MAGNESIUM: Magnesium: 2.2 mg/dL (ref 1.7–2.4)

## 2019-11-01 LAB — D-DIMER, QUANTITATIVE: D-Dimer, Quant: 0.71 ug/mL-FEU — ABNORMAL HIGH (ref 0.00–0.50)

## 2019-11-01 LAB — BRAIN NATRIURETIC PEPTIDE: B Natriuretic Peptide: 36.8 pg/mL (ref 0.0–100.0)

## 2019-11-01 MED ORDER — DEXAMETHASONE SODIUM PHOSPHATE 4 MG/ML IJ SOLN: 3.0000 mg | INTRAMUSCULAR | Status: DC

## 2019-11-01 NOTE — Progress Notes (Signed)
PROGRESS NOTE                                                                                                                                                                                                             Patient Demographics:    Dawn Fowler, is a 54 y.o. female, DOB - 29-Jun-1966, [id number]  Outpatient Primary MD for the patient is Hayden Rasmussen, MD    LOS - 2  Admit date - 10/29/2019    Chief Complaint  Patient presents with  . Shortness of Breath       Brief Narrative   Dawn Fowler is a 54 y.o. female with history of pulmonary hypertension, hypertension, interstitial lung disease chronic kidney disease stage III morbid obesity presents to the ER with complaints of increasing shortness of breath, she was diagnosed with acute on chronic hypoxic respiratory failure due to COVID-19 pneumonia and admitted to the hospital.   Subjective:   Patient in bed, appears comfortable, denies any headache, no fever, no chest pain or pressure, no shortness of breath , no abdominal pain. No focal weakness.   Assessment  & Plan :     1. Acute Hypoxic Resp. Failure due to Acute Covid 19 Viral Pneumonitis during the ongoing 2020 Covid 19 Pandemic -she has mild to moderate disease and seems to have responded very well to steroids and remdesivir, and now less than home oxygen requirement and in no distress, start tapering down steroids and finished remdesivir course.  Try to advance activity and titrate down oxygen as tolerated.  Note patient uses 8 L of oxygen at home?  Encouraged the patient to sit up in chair in the daytime use I-S and flutter valve for pulmonary toiletry and then prone in bed when at night.   SpO2: 96 % O2 Flow Rate (L/min): 2 L/min  Recent Labs  Lab 10/29/19 1843 10/30/19 0050 10/30/19 0209 10/30/19 0550 10/31/19 0647 11/01/19 0648  CRP  --  2.3* 2.3*  --  0.9 0.7  DDIMER 1.49*  --    --  1.59* 1.07* 0.71*  FERRITIN  --   --  83  --   --   --   BNP  --   --  102.3*  --  41.3 36.8  PROCALCITON <0.10  --   --   --   --   --  Hepatic Function Latest Ref Rng & Units 11/01/2019 10/31/2019 10/30/2019  Total Protein 6.5 - 8.1 g/dL 6.7 6.6 7.2  Albumin 3.5 - 5.0 g/dL 3.5 3.1(L) 3.7  AST 15 - 41 U/L '17 17 18  '$ ALT 0 - 44 U/L '16 14 16  '$ Alk Phosphatase 38 - 126 U/L 92 89 96  Total Bilirubin 0.3 - 1.2 mg/dL 0.4 0.3 0.5     2. History of pulmonary hypertension on Lasix & Opsumit.  Note she uses 4 to 5 L of nasal cannula oxygen at home.  3. Hypertension on Procardia and ARB.  4. History of gout on allopurinol.   5. History of provoked PE in 2016 after long distance travel.  Currently on prophylactic Lovenox.     Condition - Fair  Family Communication  :  None  Code Status : Full  Diet :   Diet Order            Diet Heart Room service appropriate? Yes; Fluid consistency: Thin; Fluid restriction: 1200 mL Fluid  Diet effective now               Disposition Plan  : Likely home in 2 days  Consults  : None  Procedures  : None  PUD Prophylaxis : None  DVT Prophylaxis  :  Lovenox   Lab Results  Component Value Date   PLT 241 11/01/2019    Inpatient Medications  Scheduled Meds: . allopurinol  100 mg Oral Daily  . cholecalciferol  5,000 Units Oral Daily  . [START ON 11/02/2019] dexamethasone (DECADRON) injection  3 mg Intravenous Q24H  . enoxaparin (LOVENOX) injection  0.5 mg/kg Subcutaneous Daily  . furosemide  40 mg Oral BID  . irbesartan  75 mg Oral Daily  . macitentan  10 mg Oral Daily  . NIFEdipine  60 mg Oral Daily  . sildenafil  20 mg Oral Daily  . umeclidinium bromide  1 puff Inhalation Daily   Continuous Infusions: . remdesivir 100 mg in NS 100 mL Stopped (10/31/19 1145)   PRN Meds:.acetaminophen **OR** acetaminophen, diphenhydrAMINE, traMADol  Antibiotics  :    Anti-infectives (From admission, onward)   Start     Dose/Rate Route  Frequency Ordered Stop   10/31/19 1000  remdesivir 100 mg in sodium chloride 0.9 % 100 mL IVPB     100 mg 200 mL/hr over 30 Minutes Intravenous Daily 10/30/19 0119 11/04/19 0959   10/30/19 0200  remdesivir 200 mg in sodium chloride 0.9% 250 mL IVPB     200 mg 580 mL/hr over 30 Minutes Intravenous Once 10/30/19 0119 10/30/19 0305       Time Spent in minutes  30   Lala Lund M.D on 11/01/2019 at 10:03 AM  To page go to [email or web address] - password [email or web address]  [facility] -  Office  [phone]   See all Orders from today for further details    Objective:   Vitals:   10/31/19 1925 11/01/19 0021 11/01/19 0410 11/01/19 0812  BP: (!) 143/88 (!) 152/102 (!) 152/109 (!) 171/125  Pulse: 79 64 (!) 59 (!) 54  Resp: (!) 21 (!) '21 15 18  '$ Temp: 97.6 F (36.4 C) 97.9 F (36.6 C) 98.1 F (36.7 C) 97.7 F (36.5 C)  TempSrc:  Oral  Oral  SpO2: 94% 98% 95% 96%  Weight:      Height:        Wt Readings from Last 3 Encounters:  10/29/19 111.6 kg     Intake/Output Summary (Last  24 hours) at 11/01/2019 1003 Last data filed at 10/31/2019 1505 Gross per 24 hour  Intake 945.2 ml  Output -  Net 945.2 ml     Physical Exam  Awake Alert,  No new F.N deficits, Normal affect [place].AT,PERRAL Supple Neck,No JVD, No cervical lymphadenopathy appriciated.  Symmetrical Chest wall movement, Good air movement bilaterally, CTAB RRR,No Gallops, Rubs or new Murmurs, No Parasternal Heave +ve B.Sounds, Abd Soft, No tenderness, No organomegaly appriciated, No rebound - guarding or rigidity. No Cyanosis, Clubbing or edema, No new Rash or bruise   Data Review:    CBC Recent Labs  Lab 10/29/19 1843 10/30/19 0209 10/30/19 0550 10/31/19 0647 11/01/19 0648  WBC 3.4* 4.1 2.9* 4.1 5.7  HGB 15.1* 16.2* 15.2* 15.2* 16.5*  HCT 48.5* 51.2* 48.8* 47.6* 51.6*  PLT 190 196 212 241 241  MCV 97.4 95.5 96.3 94.3 94.5  MCH 30.3 30.2 30.0 30.1 30.2  MCHC 31.1 31.6 31.1 31.9 32.0  RDW 16.2* 16.2* 16.3* 16.1*  16.1*  LYMPHSABS 0.9 0.6* 0.7  --  2.2  MONOABS 0.6 0.1 0.1  --  0.4  EOSABS 0.1 0.0 0.0  --  0.0  BASOSABS 0.0 0.0 0.0  --  0.0    Chemistries  Recent Labs  Lab 10/29/19 1843 10/30/19 0209 10/30/19 0550 10/31/19 0647 11/01/19 0648  NA 141 140 140 141 137  K 3.8 4.0 3.9 3.6 3.7  CL 108 106 103 107 104  CO2 '23 22 23 23 '$ 21*  GLUCOSE 99 171* 191* 82 89  BUN 24* 27* 28* 32* 34*  CREATININE 1.71* 1.58* 1.52* 1.38* 1.31*  CALCIUM 9.1 9.4 9.2 9.2 9.0  MG  --   --   --  2.0 2.2  AST '17 19 18 17 17  '$ ALT '13 15 16 14 16  '$ ALKPHOS 90 100 96 89 92  BILITOT 0.3 0.2* 0.5 0.3 0.4   ------------------------------------------------------------------------------------------------------------------ Recent Labs    10/29/19 1843  TRIG 137    No results found for: HGBA1C ------------------------------------------------------------------------------------------------------------------ No results for input(s): TSH, T4TOTAL, T3FREE, THYROIDAB in the last 72 hours.  Invalid input(s): FREET3  Cardiac Enzymes No results for input(s): CKMB, TROPONINI, MYOGLOBIN in the last 168 hours.  Invalid input(s): CK ------------------------------------------------------------------------------------------------------------------    Component Value Date/Time   BNP 36.8 11/01/2019 0240    Micro Results Recent Results (from the past 240 hour(s))  Blood Culture (routine x 2)     Status: None (Preliminary result)   Collection Time: 10/29/19  7:24 PM   Specimen: BLOOD  Result Value Ref Range Status   Specimen Description BLOOD RIGHT ANTECUBITAL  Final   Special Requests   Final    BOTTLES DRAWN AEROBIC AND ANAEROBIC Blood Culture adequate volume   Culture   Final    NO GROWTH 3 DAYS Performed at [facility], [address] [place], [place] [address]    Report Status PENDING  Incomplete  Blood Culture (routine x 2)     Status: None (Preliminary result)   Collection Time: 10/29/19  9:06 PM    Specimen: BLOOD  Result Value Ref Range Status   Specimen Description BLOOD LEFT FOREARM  Final   Special Requests   Final    BOTTLES DRAWN AEROBIC AND ANAEROBIC Blood Culture adequate volume   Culture   Final    NO GROWTH 3 DAYS Performed at [facility], [place] [address] [place], [place] [address]    Report Status PENDING  Incomplete  Radiology Reports DG Chest Port 1 View  Result Date: 10/29/2019 CLINICAL DATA:  Shortness of breath EXAM: PORTABLE CHEST 1 VIEW COMPARISON:  None. FINDINGS: There is significant cardiomegaly. There are no large focal infiltrates. There is vascular congestion without overt pulmonary edema. The pulmonary arteries are dilated. There is no pneumothorax. No significant pleural effusion. No acute osseous abnormality. IMPRESSION: 1. Marked cardiomegaly with vascular congestion. 2. Dilated pulmonary arteries which can be seen in patients with elevated pulmonary artery pressures. 3. No large focal infiltrate.  No pleural effusion or pneumothorax. Electronically Signed   By: Constance Holster M.D.   On: 10/29/2019 19:21

## 2019-11-01 NOTE — Progress Notes (Signed)
SATURATION QUALIFICATIONS: (This note is used to comply with regulatory documentation for home oxygen)  Patient Saturations on Room Air at Rest = 80%  Patient Saturations on Room Air while Ambulating = N/A  Patient Saturations on 2 Liters of oxygen while Ambulating = 85-92%  Please briefly explain why patient needs home oxygen: Patient desaturates quickly without oxygen at rest. While ambulating, the patient maintained an O2 saturation between 85-92% while on 2Lpm via [place]. We walked one lap around the unit and the patient did not have any difficulty breathing however she did require one brief pause because she was feeling short of breath. Patient normally uses oxygen at home at baseline.

## 2019-11-02 LAB — COMPREHENSIVE METABOLIC PANEL
ALT: 17 U/L (ref 0–44)
AST: 15 U/L (ref 15–41)
Albumin: 3.4 g/dL — ABNORMAL LOW (ref 3.5–5.0)
Alkaline Phosphatase: 91 U/L (ref 38–126)
Anion gap: 11 (ref 5–15)
BUN: 37 mg/dL — ABNORMAL HIGH (ref 6–20)
CO2: 22 mmol/L (ref 22–32)
Calcium: 9.1 mg/dL (ref 8.9–10.3)
Chloride: 106 mmol/L (ref 98–111)
Creatinine, Ser: 1.53 mg/dL — ABNORMAL HIGH (ref 0.44–1.00)
GFR calc Af Amer: 45 mL/min — ABNORMAL LOW (ref >60)
GFR calc non Af Amer: 39 mL/min — ABNORMAL LOW (ref >60)
Glucose, Bld: 95 mg/dL (ref 70–99)
Potassium: 3.7 mmol/L (ref 3.5–5.1)
Sodium: 139 mmol/L (ref 135–145)
Total Bilirubin: 0.3 mg/dL (ref 0.3–1.2)
Total Protein: 6.8 g/dL (ref 6.5–8.1)

## 2019-11-02 LAB — CBC WITH DIFFERENTIAL/PLATELET
Abs Immature Granulocytes: 0.03 10*3/uL (ref 0.00–0.07)
Basophils Absolute: 0 10*3/uL (ref 0.0–0.1)
Basophils Relative: 0 %
Eosinophils Absolute: 0 10*3/uL (ref 0.0–0.5)
Eosinophils Relative: 0 %
HCT: 50.7 % — ABNORMAL HIGH (ref 36.0–46.0)
Hemoglobin: 16.4 g/dL — ABNORMAL HIGH (ref 12.0–15.0)
Immature Granulocytes: 1 %
Lymphocytes Relative: 32 %
Lymphs Abs: 1.8 10*3/uL (ref 0.7–4.0)
MCH: 30.3 pg (ref 26.0–34.0)
MCHC: 32.3 g/dL (ref 30.0–36.0)
MCV: 93.7 fL (ref 80.0–100.0)
Monocytes Absolute: 0.6 10*3/uL (ref 0.1–1.0)
Monocytes Relative: 11 %
Neutro Abs: 3.1 10*3/uL (ref 1.7–7.7)
Neutrophils Relative %: 56 %
Platelets: 258 10*3/uL (ref 150–400)
RBC: 5.41 MIL/uL — ABNORMAL HIGH (ref 3.87–5.11)
RDW: 16.1 % — ABNORMAL HIGH (ref 11.5–15.5)
WBC: 5.5 10*3/uL (ref 4.0–10.5)
nRBC: 0 % (ref 0.0–0.2)

## 2019-11-02 LAB — BRAIN NATRIURETIC PEPTIDE: B Natriuretic Peptide: 29.8 pg/mL (ref 0.0–100.0)

## 2019-11-02 LAB — C-REACTIVE PROTEIN: CRP: 0.8 mg/dL (ref <1.0)

## 2019-11-02 LAB — MAGNESIUM: Magnesium: 2.2 mg/dL (ref 1.7–2.4)

## 2019-11-02 LAB — D-DIMER, QUANTITATIVE: D-Dimer, Quant: 0.65 ug/mL-FEU — ABNORMAL HIGH (ref 0.00–0.50)

## 2019-11-02 MED ORDER — DEXAMETHASONE SODIUM PHOSPHATE 4 MG/ML IJ SOLN
1.0000 mg | INTRAMUSCULAR | Status: DC
  Administered 2019-11-02: 1 mg via INTRAVENOUS
  Filled 2019-11-02: qty 1

## 2019-11-02 NOTE — Progress Notes (Signed)
PROGRESS NOTE                                                                                                                                                                                                             Patient Demographics:    Dawn Fowler, is a 54 y.o. female, DOB - October 07, 1966, [id number]:[id number]  Outpatient Primary MD for the patient is Hayden Rasmussen, MD    LOS - 3  Admit date - 10/29/2019    Chief Complaint  Patient presents with  . Shortness of Breath       Brief Narrative   Dawn Fowler is a 54 y.o. female with history of pulmonary hypertension, hypertension, interstitial lung disease chronic kidney disease stage III morbid obesity presents to the ER with complaints of increasing shortness of breath, she was diagnosed with acute on chronic hypoxic respiratory failure due to COVID-19 pneumonia and admitted to the hospital.   Subjective:   Patient in bed, appears comfortable, denies any headache, no fever, no chest pain or pressure, no shortness of breath , no abdominal pain. No focal weakness.   Assessment  & Plan :     1. Acute Hypoxic Resp. Failure due to Acute Covid 19 Viral Pneumonitis during the ongoing 2020 Covid 19 Pandemic -she has mild to moderate disease and seems to have responded very well to steroids and remdesivir, and now less than home oxygen requirement and in no distress on 2 lits at rest, start tapering down steroids and finished remdesivir course.  Try to advance activity and titrate down oxygen as tolerated.  Note patient uses 8 L of oxygen at home?  Encouraged the patient to sit up in chair in the daytime use I-S and flutter valve for pulmonary toiletry and then prone in bed when at night.   SpO2: 95 % O2 Flow Rate (L/min): 2 L/min  Recent Labs  Lab 10/29/19 1843 10/30/19 0050 10/30/19 0209 10/30/19 0550 10/31/19 0647 11/01/19 0648 11/02/19 0020  CRP  --  2.3* 2.3*   --  0.9 0.7 0.8  DDIMER 1.49*  --   --  1.59* 1.07* 0.71* 0.65*  FERRITIN  --   --  83  --   --   --   --   BNP  --   --  102.3*  --  41.3 36.8 29.8  PROCALCITON <0.10  --   --   --   --   --   --     Hepatic Function Latest Ref Rng & Units 11/02/2019 11/01/2019 10/31/2019  Total Protein 6.5 - 8.1 g/dL 6.8 6.7 6.6  Albumin 3.5 - 5.0 g/dL 3.4(L) 3.5 3.1(L)  AST 15 - 41 U/L '15 17 17  ' ALT 0 - 44 U/L '17 16 14  ' Alk Phosphatase 38 - 126 U/L 91 92 89  Total Bilirubin 0.3 - 1.2 mg/dL 0.3 0.4 0.3     2. History of pulmonary hypertension on Lasix & Opsumit.  Note she uses 4 to 5 L of nasal cannula oxygen at home.  3. Hypertension on Procardia and ARB.  4. History of gout on allopurinol.   5. History of provoked PE in 2016 after long distance travel.  Currently on prophylactic Lovenox.     Condition - Fair  Family Communication  :  None  Code Status : Full  Diet :   Diet Order            Diet Heart Room service appropriate? Yes; Fluid consistency: Thin; Fluid restriction: 1200 mL Fluid  Diet effective now               Disposition Plan  : Likely home in 1-2 days  Consults  : None  Procedures  : None  PUD Prophylaxis : None  DVT Prophylaxis  :  Lovenox   Lab Results  Component Value Date   PLT 258 11/02/2019    Inpatient Medications  Scheduled Meds: . allopurinol  100 mg Oral Daily  . cholecalciferol  5,000 Units Oral Daily  . dexamethasone (DECADRON) injection  1 mg Intravenous Q24H  . enoxaparin (LOVENOX) injection  0.5 mg/kg Subcutaneous Daily  . furosemide  40 mg Oral BID  . irbesartan  75 mg Oral Daily  . macitentan  10 mg Oral Daily  . NIFEdipine  60 mg Oral Daily  . sildenafil  20 mg Oral Daily  . umeclidinium bromide  1 puff Inhalation Daily   Continuous Infusions: . remdesivir 100 mg in NS 100 mL 100 mg (11/02/19 0906)   PRN Meds:.acetaminophen **OR** [DISCONTINUED] acetaminophen, diphenhydrAMINE, traMADol  Antibiotics  :    Anti-infectives (From  admission, onward)   Start     Dose/Rate Route Frequency Ordered Stop   10/31/19 1000  remdesivir 100 mg in sodium chloride 0.9 % 100 mL IVPB     100 mg 200 mL/hr over 30 Minutes Intravenous Daily 10/30/19 0119 11/04/19 0959   10/30/19 0200  remdesivir 200 mg in sodium chloride 0.9% 250 mL IVPB     200 mg 580 mL/hr over 30 Minutes Intravenous Once 10/30/19 0119 10/30/19 0305       Time Spent in minutes  30   Lala Lund M.D on 11/02/2019 at 10:29 AM  To page go to [email or web address] - password [facility]  [facility] -  Office  [phone]   See all Orders from today for further details    Objective:   Vitals:   11/01/19 1947 11/02/19 0024 11/02/19 0426 11/02/19 0745  BP: (!) 148/97 (!) 162/109 (!) 169/116   Pulse: 74 69 68   Resp: (!) 22 (!) 21 17   Temp: 97.9 F (36.6 C) (!) 97.4 F (36.3 C) 97.6 F (36.4 C) 98.2 F (36.8 C)  TempSrc: Oral Oral Oral Oral  SpO2: 92% 90% 95%   Weight:      Height:  Wt Readings from Last 3 Encounters:  10/29/19 111.6 kg    No intake or output data in the 24 hours ending 11/02/19 1029   Physical Exam  Awake Alert,  No new F.N deficits, Normal affect [place].AT,PERRAL Supple Neck,No JVD, No cervical lymphadenopathy appriciated.  Symmetrical Chest wall movement, Good air movement bilaterally, CTAB RRR,No Gallops, Rubs or new Murmurs, No Parasternal Heave +ve B.Sounds, Abd Soft, No tenderness, No organomegaly appriciated, No rebound - guarding or rigidity. No Cyanosis, Clubbing or edema, No new Rash or bruise   Data Review:    CBC Recent Labs  Lab 10/29/19 1843 10/30/19 0209 10/30/19 0550 10/31/19 0647 11/01/19 0648 11/02/19 0020  WBC 3.4* 4.1 2.9* 4.1 5.7 5.5  HGB 15.1* 16.2* 15.2* 15.2* 16.5* 16.4*  HCT 48.5* 51.2* 48.8* 47.6* 51.6* 50.7*  PLT 190 196 212 241 241 258  MCV 97.4 95.5 96.3 94.3 94.5 93.7  MCH 30.3 30.2 30.0 30.1 30.2 30.3  MCHC 31.1 31.6 31.1 31.9 32.0 32.3  RDW 16.2* 16.2* 16.3* 16.1* 16.1* 16.1*   LYMPHSABS 0.9 0.6* 0.7  --  2.2 1.8  MONOABS 0.6 0.1 0.1  --  0.4 0.6  EOSABS 0.1 0.0 0.0  --  0.0 0.0  BASOSABS 0.0 0.0 0.0  --  0.0 0.0    Chemistries  Recent Labs  Lab 10/30/19 0209 10/30/19 0550 10/31/19 0647 11/01/19 0648 11/02/19 0020  NA 140 140 141 137 139  K 4.0 3.9 3.6 3.7 3.7  CL 106 103 107 104 106  CO2 '22 23 23 ' 21* 22  GLUCOSE 171* 191* 82 89 95  BUN 27* 28* 32* 34* 37*  CREATININE 1.58* 1.52* 1.38* 1.31* 1.53*  CALCIUM 9.4 9.2 9.2 9.0 9.1  MG  --   --  2.0 2.2 2.2  AST '19 18 17 17 15  ' ALT '15 16 14 16 17  ' ALKPHOS 100 96 89 92 91  BILITOT 0.2* 0.5 0.3 0.4 0.3   ------------------------------------------------------------------------------------------------------------------ No results for input(s): CHOL, HDL, LDLCALC, TRIG, CHOLHDL, LDLDIRECT in the last 72 hours.  No results found for: HGBA1C ------------------------------------------------------------------------------------------------------------------ No results for input(s): TSH, T4TOTAL, T3FREE, THYROIDAB in the last 72 hours.  Invalid input(s): FREET3  Cardiac Enzymes No results for input(s): CKMB, TROPONINI, MYOGLOBIN in the last 168 hours.  Invalid input(s): CK ------------------------------------------------------------------------------------------------------------------    Component Value Date/Time   BNP 29.8 11/02/2019 0020    Micro Results Recent Results (from the past 240 hour(s))  Blood Culture (routine x 2)     Status: None (Preliminary result)   Collection Time: 10/29/19  7:24 PM   Specimen: BLOOD  Result Value Ref Range Status   Specimen Description BLOOD RIGHT ANTECUBITAL  Final   Special Requests   Final    BOTTLES DRAWN AEROBIC AND ANAEROBIC Blood Culture adequate volume   Culture   Final    NO GROWTH 4 DAYS Performed at [facility], [address], [place], [place] [address]    Report Status PENDING  Incomplete  Blood Culture (routine x 2)     Status: None  (Preliminary result)   Collection Time: 10/29/19  9:06 PM   Specimen: BLOOD  Result Value Ref Range Status   Specimen Description BLOOD LEFT FOREARM  Final   Special Requests   Final    BOTTLES DRAWN AEROBIC AND ANAEROBIC Blood Culture adequate volume   Culture   Final    NO GROWTH 4 DAYS Performed at [facility], [place], [place], [place]  [address]    Report Status PENDING  Incomplete    Radiology Reports DG Chest Port 1 View  Result Date: 10/29/2019 CLINICAL DATA:  Shortness of breath EXAM: PORTABLE CHEST 1 VIEW COMPARISON:  None. FINDINGS: There is significant cardiomegaly. There are no large focal infiltrates. There is vascular congestion without overt pulmonary edema. The pulmonary arteries are dilated. There is no pneumothorax. No significant pleural effusion. No acute osseous abnormality. IMPRESSION: 1. Marked cardiomegaly with vascular congestion. 2. Dilated pulmonary arteries which can be seen in patients with elevated pulmonary artery pressures. 3. No large focal infiltrate.  No pleural effusion or pneumothorax. Electronically Signed   By: Constance Holster M.D.   On: 10/29/2019 19:21

## 2019-11-03 LAB — CBC WITH DIFFERENTIAL/PLATELET
Abs Immature Granulocytes: 0.05 10*3/uL (ref 0.00–0.07)
Basophils Absolute: 0 10*3/uL (ref 0.0–0.1)
Basophils Relative: 1 %
Eosinophils Absolute: 0.1 10*3/uL (ref 0.0–0.5)
Eosinophils Relative: 2 %
HCT: 51 % — ABNORMAL HIGH (ref 36.0–46.0)
Hemoglobin: 16.1 g/dL — ABNORMAL HIGH (ref 12.0–15.0)
Immature Granulocytes: 1 %
Lymphocytes Relative: 51 %
Lymphs Abs: 3.8 10*3/uL (ref 0.7–4.0)
MCH: 29.8 pg (ref 26.0–34.0)
MCHC: 31.6 g/dL (ref 30.0–36.0)
MCV: 94.3 fL (ref 80.0–100.0)
Monocytes Absolute: 0.7 10*3/uL (ref 0.1–1.0)
Monocytes Relative: 10 %
Neutro Abs: 2.5 10*3/uL (ref 1.7–7.7)
Neutrophils Relative %: 35 %
Platelets: 246 10*3/uL (ref 150–400)
RBC: 5.41 MIL/uL — ABNORMAL HIGH (ref 3.87–5.11)
RDW: 16.1 % — ABNORMAL HIGH (ref 11.5–15.5)
WBC: 7.2 10*3/uL (ref 4.0–10.5)
nRBC: 0 % (ref 0.0–0.2)

## 2019-11-03 LAB — CULTURE, BLOOD (ROUTINE X 2)
Culture: NO GROWTH
Culture: NO GROWTH
Special Requests: ADEQUATE
Special Requests: ADEQUATE

## 2019-11-03 LAB — COMPREHENSIVE METABOLIC PANEL
ALT: 17 U/L (ref 0–44)
AST: 15 U/L (ref 15–41)
Albumin: 3.2 g/dL — ABNORMAL LOW (ref 3.5–5.0)
Alkaline Phosphatase: 85 U/L (ref 38–126)
Anion gap: 13 (ref 5–15)
BUN: 34 mg/dL — ABNORMAL HIGH (ref 6–20)
CO2: 22 mmol/L (ref 22–32)
Calcium: 8.8 mg/dL — ABNORMAL LOW (ref 8.9–10.3)
Chloride: 103 mmol/L (ref 98–111)
Creatinine, Ser: 1.49 mg/dL — ABNORMAL HIGH (ref 0.44–1.00)
GFR calc Af Amer: 46 mL/min — ABNORMAL LOW (ref >60)
GFR calc non Af Amer: 40 mL/min — ABNORMAL LOW (ref >60)
Glucose, Bld: 54 mg/dL — ABNORMAL LOW (ref 70–99)
Potassium: 3.8 mmol/L (ref 3.5–5.1)
Sodium: 138 mmol/L (ref 135–145)
Total Bilirubin: 0.5 mg/dL (ref 0.3–1.2)
Total Protein: 6.2 g/dL — ABNORMAL LOW (ref 6.5–8.1)

## 2019-11-03 LAB — MAGNESIUM: Magnesium: 2 mg/dL (ref 1.7–2.4)

## 2019-11-03 LAB — C-REACTIVE PROTEIN: CRP: <0.5 mg/dL (ref <1.0)

## 2019-11-03 LAB — BRAIN NATRIURETIC PEPTIDE: B Natriuretic Peptide: 25.1 pg/mL (ref 0.0–100.0)

## 2019-11-03 LAB — D-DIMER, QUANTITATIVE: D-Dimer, Quant: 0.7 ug/mL-FEU — ABNORMAL HIGH (ref 0.00–0.50)

## 2019-11-03 MED ORDER — HYDRALAZINE HCL 25 MG PO TABS: 50.0000 mg | ORAL_TABLET | Freq: Three times a day (TID) | ORAL | Status: DC

## 2019-11-03 MED ORDER — HYDRALAZINE HCL 50 MG PO TABS: 50.0000 mg | ORAL_TABLET | Freq: Three times a day (TID) | ORAL | 0 refills | Status: DC

## 2019-11-03 MED ORDER — ALBUTEROL SULFATE HFA 108 (90 BASE) MCG/ACT IN AERS: 2.0000 | INHALATION_SPRAY | Freq: Four times a day (QID) | RESPIRATORY_TRACT | 0 refills | Status: DC | PRN

## 2019-11-03 NOTE — TOC Transition Note (Signed)
Transition of Care [facility]) - CM/SW Discharge Note   Patient Details  Name: Dawn Fowler MRN: [id number] Date of Birth: [birth year]  [facility] CM/SW Contact:  Ninfa Meeker, RN Phone Number: 11/03/2019, 10:52 AM   Clinical Narrative:   Patient is being discharged home. Oxygen dependent and has equipment at home. No Case Manager needs identified.           Patient Goals and CMS Choice        Discharge Placement                       Discharge Plan and Services                                     Social Determinants of Health (SDOH) Interventions     Readmission Risk Interventions No flowsheet data found.

## 2019-11-03 NOTE — Discharge Instructions (Signed)
Follow with Primary MD Hayden Rasmussen, MD in 7 days   Get CBC, CMP, 2 view Chest X ray -  checked next visit within 1 week by Primary MD    Activity: As tolerated with Full fall precautions use walker/cane & assistance as needed  Disposition Home   Diet: Heart Healthy    Special Instructions: If you have smoked or chewed Tobacco  in the last 2 yrs please stop smoking, stop any regular Alcohol  and or any Recreational drug use.  On your next visit with your primary care physician please Get Medicines reviewed and adjusted.  Please request your Prim.MD to go over all Hospital Tests and Procedure/Radiological results at the follow up, please get all Hospital records sent to your Prim MD by signing hospital release before you go home.  If you experience worsening of your admission symptoms, develop shortness of breath, life threatening emergency, suicidal or homicidal thoughts you must seek medical attention immediately by calling 911 or calling your MD immediately  if symptoms less severe.  You Must read complete instructions/literature along with all the possible adverse reactions/side effects for all the Medicines you take and that have been prescribed to you. Take any new Medicines after you have completely understood and accpet all the possible adverse reactions/side effects.       Person Under Monitoring Name: Dawn Fowler  Location: [place] [address]   Infection Prevention Recommendations for Individuals Confirmed to have, or Being Evaluated for, 2019 Novel Coronavirus (COVID-19) Infection Who Receive Care at Home  Individuals who are confirmed to have, or are being evaluated for, COVID-19 should follow the prevention steps below until a healthcare provider or local or state health department says they can return to normal activities.  Stay home except to get medical care You should restrict activities outside your home, except for getting  medical care. Do not go to work, school, or public areas, and do not use public transportation or taxis.  Call ahead before visiting your doctor Before your medical appointment, call the healthcare provider and tell them that you have, or are being evaluated for, COVID-19 infection. This will help the healthcare provider's office take steps to keep other people from getting infected. Ask your healthcare provider to call the local or state health department.  Monitor your symptoms Seek prompt medical attention if your illness is worsening (e.g., difficulty breathing). Before going to your medical appointment, call the healthcare provider and tell them that you have, or are being evaluated for, COVID-19 infection. Ask your healthcare provider to call the local or state health department.  Wear a facemask You should wear a facemask that covers your nose and mouth when you are in the same room with other people and when you visit a healthcare provider. People who live with or visit you should also wear a facemask while they are in the same room with you.  Separate yourself from other people in your home As much as possible, you should stay in a different room from other people in your home. Also, you should use a separate bathroom, if available.  Avoid sharing household items You should not share dishes, drinking glasses, cups, eating utensils, towels, bedding, or other items with other people in your home. After using these items, you should wash them thoroughly with soap and water.  Cover your coughs and sneezes Cover your mouth and nose with a tissue when you cough or sneeze, or you can  cough or sneeze into your sleeve. Throw used tissues in a lined trash can, and immediately wash your hands with soap and water for at least 20 seconds or use an alcohol-based hand rub.  Wash your [facility] your hands often and thoroughly with soap and water for at least 20 seconds. You can use an  alcohol-based hand sanitizer if soap and water are not available and if your hands are not visibly dirty. Avoid touching your eyes, nose, and mouth with unwashed hands.   Prevention Steps for Caregivers and Household Members of Individuals Confirmed to have, or Being Evaluated for, COVID-19 Infection Being Cared for in the Home  If you live with, or provide care at home for, a person confirmed to have, or being evaluated for, COVID-19 infection please follow these guidelines to prevent infection:  Follow healthcare provider's instructions Make sure that you understand and can help the patient follow any healthcare provider instructions for all care.  Provide for the patient's basic needs You should help the patient with basic needs in the home and provide support for getting groceries, prescriptions, and other personal needs.  Monitor the patient's symptoms If they are getting sicker, call his or her medical provider and tell them that the patient has, or is being evaluated for, COVID-19 infection. This will help the healthcare provider's office take steps to keep other people from getting infected. Ask the healthcare provider to call the local or state health department.  Limit the number of people who have contact with the patient  If possible, have only one caregiver for the patient.  Other household members should stay in another home or place of residence. If this is not possible, they should stay  in another room, or be separated from the patient as much as possible. Use a separate bathroom, if available.  Restrict visitors who do not have an essential need to be in the home.  Keep older adults, very young children, and other sick people away from the patient Keep older adults, very young children, and those who have compromised immune systems or chronic health conditions away from the patient. This includes people with chronic heart, lung, or kidney conditions, diabetes, and  cancer.  Ensure good ventilation Make sure that shared spaces in the home have good air flow, such as from an air conditioner or an opened window, weather permitting.  Wash your hands often  Wash your hands often and thoroughly with soap and water for at least 20 seconds. You can use an alcohol based hand sanitizer if soap and water are not available and if your hands are not visibly dirty.  Avoid touching your eyes, nose, and mouth with unwashed hands.  Use disposable paper towels to dry your hands. If not available, use dedicated cloth towels and replace them when they become wet.  Wear a facemask and gloves  Wear a disposable facemask at all times in the room and gloves when you touch or have contact with the patient's blood, body fluids, and/or secretions or excretions, such as sweat, saliva, sputum, nasal mucus, vomit, urine, or feces.  Ensure the mask fits over your nose and mouth tightly, and do not touch it during use.  Throw out disposable facemasks and gloves after using them. Do not reuse.  Wash your hands immediately after removing your facemask and gloves.  If your personal clothing becomes contaminated, carefully remove clothing and launder. Wash your hands after handling contaminated clothing.  Place all used disposable facemasks, gloves, and  other waste in a lined container before disposing them with other household waste.  Remove gloves and wash your hands immediately after handling these items.  Do not share dishes, glasses, or other household items with the patient  Avoid sharing household items. You should not share dishes, drinking glasses, cups, eating utensils, towels, bedding, or other items with a patient who is confirmed to have, or being evaluated for, COVID-19 infection.  After the person uses these items, you should wash them thoroughly with soap and water.  Wash laundry thoroughly  Immediately remove and wash clothes or bedding that have blood, body  fluids, and/or secretions or excretions, such as sweat, saliva, sputum, nasal mucus, vomit, urine, or feces, on them.  Wear gloves when handling laundry from the patient.  Read and follow directions on labels of laundry or clothing items and detergent. In general, wash and dry with the warmest temperatures recommended on the label.  Clean all areas the individual has used often  Clean all touchable surfaces, such as counters, tabletops, doorknobs, bathroom fixtures, toilets, phones, keyboards, tablets, and bedside tables, every day. Also, clean any surfaces that may have blood, body fluids, and/or secretions or excretions on them.  Wear gloves when cleaning surfaces the patient has come in contact with.  Use a diluted bleach solution (e.g., dilute bleach with 1 part bleach and 10 parts water) or a household disinfectant with a label that says EPA-registered for coronaviruses. To make a bleach solution at home, add 1 tablespoon of bleach to 1 quart (4 cups) of water. For a larger supply, add  cup of bleach to 1 gallon (16 cups) of water.  Read labels of cleaning products and follow recommendations provided on product labels. Labels contain instructions for safe and effective use of the cleaning product including precautions you should take when applying the product, such as wearing gloves or eye protection and making sure you have good ventilation during use of the product.  Remove gloves and wash hands immediately after cleaning.  Monitor yourself for signs and symptoms of illness Caregivers and household members are considered close contacts, should monitor their health, and will be asked to limit movement outside of the home to the extent possible. Follow the monitoring steps for close contacts listed on the symptom monitoring form.   ? If you have additional questions, contact your local health department or call the epidemiologist on call at [phone] (available 24/7). ? This  guidance is subject to change. For the most up-to-date guidance from [facility], please refer to their website: [email or web address]

## 2019-11-03 NOTE — Discharge Summary (Signed)
Dawn Fowler QKM:[id number] DOB: 09/19/1966 DOA: 10/29/2019  PCP: Hayden Rasmussen, MD  Admit date: 10/29/2019  Discharge date: 11/03/2019  Admitted From: h[facility]   Disposition:  [facility]   Recommendations for Outpatient Follow-up:   Follow up with PCP in 1-2 weeks  PCP Please obtain BMP/CBC, 2 view CXR in 1week,  (see Discharge instructions)   PCP Please follow up on the following pending results:    Home Health: nONE   Equipment/Devices: HAS hOME O2  Consultations: None  Discharge Condition: Stable    CODE STATUS: Full    Diet Recommendation: Heart Healthy     Chief Complaint  Patient presents with  . Shortness of Breath     Brief history of present illness from the day of admission and additional interim summary    Dawn Fowler a 54 y.o.femalewithhistory of pulmonary hypertension, hypertension, interstitial lung disease chronic kidney disease stage III morbid obesity presents to the ER with complaints of increasing shortness of breath, she was diagnosed with acute on chronic hypoxic respiratory failure due to COVID-19 pneumonia and admitted to the hospital.                                                                 Hospital Course     1. Acute Hypoxic Resp. Failure due to Acute Covid 19 Viral Pneumonitis during the ongoing 2020 Covid 19 Pandemic - she haD mild disease and  responded very well to IV steroids and remdesivir, now better than baseline requiring 2 L nasal cannula at rest.  Note patient uses 8 L of oxygen at home?Mar[place] Kitchen  She will be discharged home on 2 L nasal cannula at rest with a rescue inhaler, continue her other home medications for pulmonary hypertension, requested to follow with PCP and local pulmonary group within 1 to 2 weeks.   SpO2: 93 % O2 Flow Rate (L/min): 2 L/min  Recent  Labs  Lab 10/29/19 1843 10/30/19 0209 10/30/19 0550 10/31/19 0647 11/01/19 0648 11/02/19 0020 11/03/19 0445  CRP  --  2.3*  --  0.9 0.7 0.8 <0.5  DDIMER 1.49*  --  1.59* 1.07* 0.71* 0.65* 0.70*  FERRITIN  --  83  --   --   --   --   --   BNP  --  102.3*  --  41.3 36.8 29.8 25.1  PROCALCITON <0.10  --   --   --   --   --   --     Hepatic Function Latest Ref Rng & Units 11/03/2019 11/02/2019 11/01/2019  Total Protein 6.5 - 8.1 g/dL 6.2(L) 6.8 6.7  Albumin 3.5 - 5.0 g/dL 3.2(L) 3.4(L) 3.5  AST 15 - 41 U/L _0 ALT 0 - 44 U/L _1 Alk Phosphatase 38 - 126 U/L 85  91 92  Total Bilirubin 0.3 - 1.2 mg/dL 0.5 0.3 0.4      2. History of pulmonary hypertension on Lasix &Opsumit.  Note she uses 4-8 L of nasal cannula oxygen at home. 3. Hypertension on Procardia and ARB. 4. History of gout on allopurinol. 5. History of provoked PE in 2016 after long distance travel.  Currently on prophylactic Lovenox.    6.  CKD 3.  Creatinine stable at around 1.5.  Patient states she has longstanding history of kidney problems and would like to see a renal physician here, she is already on combination of Lasix and ARB which would be continued, local nephrology follow-up recommended upon discharge.  Discharge diagnosis     Principal Problem:   Acute respiratory failure due to COVID-19 [facility]) Active Problems:   Pulmonary hypertension (HCC)   Gout   CKD (chronic kidney disease) stage 3, GFR 30-59 ml/min    Discharge instructions    Discharge Instructions    Diet - low sodium heart healthy   Complete by: As directed    Discharge instructions   Complete by: As directed    Follow with Primary MD Hayden Rasmussen, MD in 7 days   Get CBC, CMP, 2 view Chest X ray -  checked next visit within 1 week by Primary MD    Activity: As tolerated with Full fall precautions use walker/cane & assistance as needed  Disposition Home   Diet: Heart Healthy    Special Instructions: If you have smoked  or chewed Tobacco  in the last 2 yrs please stop smoking, stop any regular Alcohol  and or any Recreational drug use.  On your next visit with your primary care physician please Get Medicines reviewed and adjusted.  Please request your Prim.MD to go over all Hospital Tests and Procedure/Radiological results at the follow up, please get all Hospital records sent to your Prim MD by signing hospital release before you go home.  If you experience worsening of your admission symptoms, develop shortness of breath, life threatening emergency, suicidal or homicidal thoughts you must seek medical attention immediately by calling 911 or calling your MD immediately  if symptoms less severe.  You Must read complete instructions/literature along with all the possible adverse reactions/side effects for all the Medicines you take and that have been prescribed to you. Take any new Medicines after you have completely understood and accpet all the possible adverse reactions/side effects.   Increase activity slowly   Complete by: As directed    MyChart COVID-19 home monitoring program   Complete by: Nov 03, 2019    Is the patient willing to use the [place] for home monitoring?: Yes   Temperature monitoring   Complete by: Nov 03, 2019    After how many days would you like to receive a notification of this patient's flowsheet entries?: 1      Discharge Medications   Allergies as of 11/03/2019      Reactions   Adhesive [tape] Hives   Paper tape   Succinylcholine Other (See Comments)   Have trouble waking up   Tizanidine Hives      Medication List    TAKE these medications   albuterol 108 (90 Base) MCG/ACT inhaler Commonly known as: VENTOLIN HFA Inhale 2 puffs into the lungs every 6 (six) hours as needed for wheezing or shortness of breath.   allopurinol 100 MG tablet Commonly known as: ZYLOPRIM Take 100 mg by mouth daily.   Alyq 20  MG tablet Generic drug: tadalafil (PAH) Take 20 mg by  mouth daily.   colchicine 0.6 MG tablet Take 0.6 mg by mouth as needed (Gout).   diclofenac Sodium 1 % Gel Commonly known as: VOLTAREN Apply 2 g topically at bedtime.   furosemide 40 MG tablet Commonly known as: LASIX Take 40 mg by mouth 2 (two) times daily.   hydrALAZINE 50 MG tablet Commonly known as: APRESOLINE Take 1 tablet (50 mg total) by mouth every 8 (eight) hours.   NIFEdipine 60 MG 24 hr tablet Commonly known as: PROCARDIA XL/NIFEDICAL XL Take 60 mg by mouth daily.   Opsumit 10 MG tablet Generic drug: macitentan Take 10 mg by mouth daily.   Premarin 0.625 MG tablet Generic drug: estrogens (conjugated) Take 0.625 mg by mouth daily.   sildenafil 20 MG tablet Commonly known as: REVATIO Take 20 mg by mouth daily.   telmisartan 40 MG tablet Commonly known as: MICARDIS Take 20 mg by mouth daily.   tiotropium 18 MCG inhalation capsule Commonly known as: SPIRIVA Place 18 mcg into inhaler and inhale daily.   traMADol 50 MG tablet Commonly known as: ULTRAM Take 50 mg by mouth every 6 (six) hours as needed for moderate pain.   Vitamin D-3 125 MCG (5000 UT) Tabs Take 5,000 mg by mouth daily.       Follow-up Information    Hayden Rasmussen, MD. Schedule an appointment as soon as possible for a visit in 1 week(s).   Specialty: Family Medicine Contact information: [place] [address] [phone]        Brand Males, MD. Schedule an appointment as soon as possible for a visit in 2 week(s).   Specialty: Pulmonary Disease Why: Pulm, Hypertension Contact information: [place] [address] [phone]        Elmarie Shiley, MD. Schedule an appointment as soon as possible for a visit in 1 week(s).   Specialty: Nephrology Why: CKD 4 Contact information: [place] [address] [phone]           Major procedures and Radiology Reports - PLEASE review detailed and final reports  thoroughly  -       DG Chest Port 1 View  Result Date: 10/29/2019 CLINICAL DATA:  Shortness of breath EXAM: PORTABLE CHEST 1 VIEW COMPARISON:  None. FINDINGS: There is significant cardiomegaly. There are no large focal infiltrates. There is vascular congestion without overt pulmonary edema. The pulmonary arteries are dilated. There is no pneumothorax. No significant pleural effusion. No acute osseous abnormality. IMPRESSION: 1. Marked cardiomegaly with vascular congestion. 2. Dilated pulmonary arteries which can be seen in patients with elevated pulmonary artery pressures. 3. No large focal infiltrate.  No pleural effusion or pneumothorax. Electronically Signed   By: Constance Holster M.D.   On: 10/29/2019 19:21    Micro Results     Recent Results (from the past 240 hour(s))  Blood Culture (routine x 2)     Status: None (Preliminary result)   Collection Time: 10/29/19  7:24 PM   Specimen: BLOOD  Result Value Ref Range Status   Specimen Description BLOOD RIGHT ANTECUBITAL  Final   Special Requests   Final    BOTTLES DRAWN AEROBIC AND ANAEROBIC Blood Culture adequate volume   Culture   Final    NO GROWTH 4 DAYS Performed at [facility], [address] [place], [place] [address]    Report Status PENDING  Incomplete  Blood Culture (routine x 2)     Status: None (Preliminary result)   Collection Time: 10/29/19  9:06 PM   Specimen: BLOOD  Result Value Ref Range Status   Specimen Description BLOOD LEFT FOREARM  Final   Special Requests   Final    BOTTLES DRAWN AEROBIC AND ANAEROBIC Blood Culture adequate volume   Culture   Final    NO GROWTH 4 DAYS Performed at [facility], [address] [place], [place] [address]    Report Status PENDING  Incomplete    Today   Subjective    Dawn Fowler today has no headache,no chest abdominal pain,no new weakness tingling or numbness, feels much better wants to go home today.     Objective   Blood pressure (!) 138/96,  pulse 68, temperature 98.3 F (36.8 C), temperature source Oral, resp. rate (!) 47, height _0  (1.549 m), weight 111.6 kg, SpO2 93 %.  No intake or output data in the 24 hours ending 11/03/19 0955  Exam  Awake Alert, Oriented x 3, No new F.N deficits, Normal affect [place].AT,PERRAL Supple Neck,No JVD, No cervical lymphadenopathy appriciated.  Symmetrical Chest wall movement, Good air movement bilaterally, CTAB RRR,No Gallops,Rubs or new Murmurs, No Parasternal Heave +ve B.Sounds, Abd Soft, Non tender, No organomegaly appriciated, No rebound -guarding or rigidity. No Cyanosis, Clubbing or edema, No new Rash or bruise   Data Review   CBC w Diff:  Lab Results  Component Value Date   WBC 7.2 11/03/2019   HGB 16.1 (H) 11/03/2019   HCT 51.0 (H) 11/03/2019   PLT 246 11/03/2019   LYMPHOPCT 51 11/03/2019   MONOPCT 10 11/03/2019   EOSPCT 2 11/03/2019   BASOPCT 1 11/03/2019    CMP:  Lab Results  Component Value Date   NA 138 11/03/2019   K 3.8 11/03/2019   CL 103 11/03/2019   CO2 22 11/03/2019   BUN 34 (H) 11/03/2019   CREATININE 1.49 (H) 11/03/2019   PROT 6.2 (L) 11/03/2019   ALBUMIN 3.2 (L) 11/03/2019   BILITOT 0.5 11/03/2019   ALKPHOS 85 11/03/2019   AST 15 11/03/2019   ALT 17 11/03/2019  .   Total Time in preparing paper work, data evaluation and todays exam - 105 minutes  Lala Lund M.D on 11/03/2019 at 9:55 AM  [facility]   Office  [phone]

## 2019-11-04 ENCOUNTER — Ambulatory Visit: Payer: Self-pay

## 2019-11-06 NOTE — Progress Notes (Signed)
Spoke with Ms. Dawn Fowler she stated she was just released from the hospital for [facility] on Monday, Jan. 4, 2021.  She was hospitalized for 5 days.  I spoke with [name] to inform her of patient's COVID positive status.  Patient will need to be rescheduled 21 days after symptoms cease.

## 2019-11-14 ENCOUNTER — Encounter (HOSPITAL_COMMUNITY): Admission: RE | Payer: Self-pay | Source: Home / Self Care

## 2019-11-14 ENCOUNTER — Ambulatory Visit (HOSPITAL_COMMUNITY): Admission: RE | Admit: 2019-11-14 | Payer: Medicare Other | Source: Home / Self Care | Admitting: Gastroenterology

## 2019-11-14 SURGERY — COLONOSCOPY WITH PROPOFOL
Anesthesia: Monitor Anesthesia Care

## 2019-11-20 ENCOUNTER — Other Ambulatory Visit: Payer: Self-pay

## 2019-11-20 ENCOUNTER — Encounter: Payer: Self-pay | Admitting: Pulmonary Disease

## 2019-11-20 ENCOUNTER — Ambulatory Visit: Payer: Medicare Other | Admitting: Pulmonary Disease

## 2019-11-20 VITALS — BP 124/80 | HR 102 | Temp 97.9°F | Ht 61.0 in | Wt 252.3 lb

## 2019-11-20 DIAGNOSIS — I272 Pulmonary hypertension, unspecified: Secondary | ICD-10-CM

## 2019-11-20 DIAGNOSIS — G4733 Obstructive sleep apnea (adult) (pediatric): Secondary | ICD-10-CM | POA: Insufficient documentation

## 2019-11-20 DIAGNOSIS — Z6841 Body Mass Index (BMI) 40.0 and over, adult: Secondary | ICD-10-CM

## 2019-11-20 DIAGNOSIS — Z7189 Other specified counseling: Secondary | ICD-10-CM

## 2019-11-20 DIAGNOSIS — Z8616 Personal history of COVID-19: Secondary | ICD-10-CM

## 2019-11-20 DIAGNOSIS — E669 Obesity, unspecified: Secondary | ICD-10-CM | POA: Insufficient documentation

## 2019-11-20 DIAGNOSIS — Z09 Encounter for follow-up examination after completed treatment for conditions other than malignant neoplasm: Secondary | ICD-10-CM

## 2019-11-20 DIAGNOSIS — Z9989 Dependence on other enabling machines and devices: Secondary | ICD-10-CM | POA: Insufficient documentation

## 2019-11-20 DIAGNOSIS — N183 Chronic kidney disease, stage 3 unspecified: Secondary | ICD-10-CM

## 2019-11-20 DIAGNOSIS — J9611 Chronic respiratory failure with hypoxia: Secondary | ICD-10-CM

## 2019-11-20 DIAGNOSIS — I129 Hypertensive chronic kidney disease with stage 1 through stage 4 chronic kidney disease, or unspecified chronic kidney disease: Secondary | ICD-10-CM

## 2019-11-20 DIAGNOSIS — U071 COVID-19: Secondary | ICD-10-CM

## 2019-11-20 HISTORY — DX: COVID-19: U07.1

## 2019-11-20 NOTE — Assessment & Plan Note (Signed)
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

## 2019-11-20 NOTE — Assessment & Plan Note (Signed)
Plan:  Referral to nephrology

## 2019-11-20 NOTE — Assessment & Plan Note (Signed)
Plan: We will request records from [facility] We will coordinate getting patient set up with a sleep MD in our office Patient to continue to use CPAP therapy

## 2019-11-20 NOTE — Progress Notes (Signed)
Subjective:   PATIENT ID: Dawn Fowler GENDER: female DOB: 28-Aug-1966, MRN: [id number]   Chief Complaint  Patient presents with   Consult    restrictive lung disease   Past medical history:pulmonary hypertension, hypertension, interstitial lung disease chronic kidney disease stage III morbid obesity   Reason for Visit: Consult today on 11/20/2019 status post COVID-19 infection and hospitalization at [facility].  Patient was hospitalized on 10/29/2019 and discharged on 11/03/2019  Patient establishing with our office today.  She reports that she is previously established at the [facility] in [place].  We have requested these records to be faxed to [place].  Patient reports today that primary care Dr. Dennie Fetters office should be faxing this to our office today.  She was last evaluated at the [facility] for interstitial lung disease in June/2020.  Unfortunately her mother passed away around that time and that is what prompted the patient eventually moving to [place].  She previously stayed with her son in a town outside [place], [place].  Patient reports now that she has moved to [facility] and is residing here.  She is looking to establish with our office today for future pulmonary management.  There are pulmonary records from 02/11/2015 upon the initial consult from [place].  Patient was found to have very severe sleep apnea with an AHI greater than 130 and she was noncompliant with her CPAP.  CTA chest performed at that time shows some degree of pulmonary edema.  The office visit note from 02/11/2015 shows that patient's pulmonary function test was normal.  She is emphasized to lose weight, continue CPAP, maintain an daily diuretic regimen. There is another [facility] OV note from 05/24/2015 that is available in care everywhere.  There they have the patient documented as having secondary pulmonary hypertension, uncontrolled hypertension, morbid obesity and obstructive  sleep apnea severe.  At that time patient was emphasized to continue to have adherence to her CPAP.  And to refer back to the bariatric surgery program.  She was recommended to follow-up again in 3 months.  It does not look like this was completed.  PCP is faxing our office notes from [facility]. We will request the [facility] notes.   Patient reporting that since being discharged from the hospital earlier this month she has been doing okay.  Her hospitalization discharge note says the patient should be discharged on 2 L at baseline.  Walk today in office confirms that she needs to be on 8 L with physical exertion.  Patient reports that she has been maintained on 8 L since she got home.  Patient is unsure about her diagnosis of interstitial lung disease.  This was mentioned in the hospital discharge note.  She is unable to report any aspect of autoimmune disorder or other interstitial lung disease.  This is also not present in the 2016 [facility] note.  Patient reports she continues to feel dyspneic.  She continues to be maintained on CPAP therapy.  She reports she use this every night.  She is previously established with a cardiologist and a nephrologist in [place].  She is expecting to be established with a new nephrologist in [place].  She is unsure of the status of this referral.    Past Medical History:  Diagnosis Date   CKD (chronic kidney disease), stage III    HTN (hypertension)    Pulmonary HTN ([place])    Sleep apnea    on CPAP  Family History  Problem Relation Age of Onset   COPD Father    Hypertension Brother    Pulmonary Hypertension Neg Hx      Social History   Occupational History   Not on file  Tobacco Use   Smoking status: Former Smoker   Smokeless tobacco: Never Used   Tobacco comment: only smoked for fun in highschool not even one a day  Substance and Sexual Activity   Alcohol use: Not Currently   Drug use: Not Currently     Sexual activity: Not on file    Allergies  Allergen Reactions   Adhesive [Tape] Hives    Paper tape   Succinylcholine Other (See Comments)    Have trouble waking up   Tizanidine Hives     Outpatient Medications Prior to Visit  Medication Sig Dispense Refill   allopurinol (ZYLOPRIM) 100 MG tablet Take 100 mg by mouth daily.     ALYQ 20 MG tablet Take 20 mg by mouth daily.     Cholecalciferol (VITAMIN D-3) 125 MCG (5000 UT) TABS Take 5,000 mg by mouth daily.     colchicine 0.6 MG tablet Take 0.6 mg by mouth as needed (Gout).     diclofenac Sodium (VOLTAREN) 1 % GEL Apply 2 g topically at bedtime.     furosemide (LASIX) 40 MG tablet Take 40 mg by mouth 2 (two) times daily.     hydrALAZINE (APRESOLINE) 50 MG tablet Take 1 tablet (50 mg total) by mouth every 8 (eight) hours. 90 tablet 0   NIFEdipine (PROCARDIA XL/NIFEDICAL XL) 60 MG 24 hr tablet Take 60 mg by mouth daily.     OPSUMIT 10 MG tablet Take 10 mg by mouth daily.     PREMARIN 0.625 MG tablet Take 0.625 mg by mouth daily.     sildenafil (REVATIO) 20 MG tablet Take 20 mg by mouth daily.     telmisartan (MICARDIS) 40 MG tablet Take 20 mg by mouth daily.     traMADol (ULTRAM) 50 MG tablet Take 50 mg by mouth every 6 (six) hours as needed for moderate pain.      albuterol (VENTOLIN HFA) 108 (90 Base) MCG/ACT inhaler Inhale 2 puffs into the lungs every 6 (six) hours as needed for wheezing or shortness of breath. (Patient not taking: Reported on 11/20/2019) 6.7 g 0   tiotropium (SPIRIVA) 18 MCG inhalation capsule Place 18 mcg into inhaler and inhale daily.     No facility-administered medications prior to visit.    Review of Systems  Constitutional: Positive for malaise/fatigue. Negative for fever.  Respiratory: Positive for shortness of breath.   Cardiovascular: Positive for leg swelling. Negative for chest pain.  Genitourinary: Negative for dysuria.  Musculoskeletal: Positive for back pain and neck pain.  Negative for myalgias.  Neurological: Negative for dizziness.  Psychiatric/Behavioral: Negative for depression. The patient is not nervous/anxious.      Objective:   Vitals:   11/20/19 1214  BP: 124/80  Pulse: (!) 102  Temp: 97.9 F (36.6 C)  TempSrc: Temporal  SpO2: 96%  Weight: 252 lb 4.8 oz (114.4 kg)  Height: 5\' 1"  (1.549 m)   SpO2: 96 % O2 Device: Nasal cannula O2 Flow Rate (L/min): 4 L/min  Physical Exam:  General: Well-appearing, no acute distress HENT: [place], AT, OP clear, MMM Eyes: EOMI, no scleral icterus Respiratory: Clear to auscultation bilaterally.  No crackles, wheezing or rales, diminished breath sounds throughout entire exam, likely due to body habitus Cardiovascular: RRR, -M/R/G, distant heart  tones likely due to body habitus GI: BS+, soft, nontender, obese Extremities: 1-2+ LE Edema - bilaterally, tolerated walk in office  Neuro: AAO x4, CNII-XII grossly intact Skin: Intact, no rashes or bruising Psych: Normal mood, normal affect  Data Reviewed:  Imaging:  12/25/2014-VQ scan-mild obstructive airways disease, very low probability of PE 12/18/2014-CTA anterior-no evidence of PE, cardiomegaly, dependent airspace trapping which may be related to morbid obesity or small airways disease ^Unable to view images but can read radiology report  10/29/2019-chest x-ray-marked cardiomegaly with vascular congestion, dilated pulmonary arteries which can be seen in patients with elevated pulmonary pressures, no large focal infiltrate   PFT: 02/11/2015-office spirometry-FVC 2.14 L, FEV1 1.78, ratio 83  Labs:  12/21/2014-echocardiogram-LV ejection fraction 60 to 65%, moderate pulmonary hypertension, moderate diastolic dysfunction  Walk:  11/20/2019 -tolerated walk in office, patient required 8 L of O2 to maintain oxygen saturations above 88%    I have personally reviewed patient's past medical/family/social history, allergies, current medications.    Assessment &  Plan:   Discussion: This is a pleasant 54 year old female establishing with our pulmonary office today.  Patient with history of severe obstructive sleep apnea.  We will work to get her established with one of our sleep MDs in office.  Walk today in office confirms the patient needs to be maintained on 8 L of O2 with physical exertion.  We will request records from previous pulmonologist both at [place] as well as that [facility] to further investigate causes of her pulmonary hypertension.  It appears likely that patient may have WHO class III pulmonary hypertension.  We will review the records and further evaluate.  Patient is status post Covid.  May need to consider repeat pulmonary function testing and/or CT imaging due to this.  Health Maintenance Immunization History  Administered Date(s) Administered   Influenza Whole 08/20/2019   Influenza,inj,Quad PF,6+ Mos 06/25/2018   Pneumococcal Polysaccharide-23 07/17/2014   CT Lung Screen -never smoker, would not qualify  OSA (obstructive sleep apnea) Plan: We will [facility] records [facility] We will coordinate getting patient set up with a sleep MD in our office Patient to continue to use CPAP therapy  Chronic respiratory failure with hypoxia ([place]) Walk today in office to further evaluate oxygen needs  Plan: Continue oxygen therapy Maintain oxygen saturations above 88%  COVID-19 virus detected Plan: Walk today in office today Maintain oxygen saturations above 88 to 90% Patient likely would benefit from physical therapy, I have encouraged her to discuss this with her primary care provider May need to consider repeat CT imaging and pulmonary function testing, will basis decision off of previous records that we have requested today  CKD (chronic kidney disease) stage 3, GFR 30-59 ml/min Plan:  Referral to nephrology   Pulmonary hypertension ([place]) Patient is unable to report any sort of autoimmune disorders 2016  [facility] notes for further patient has elevated pulmonary pressures directly related to obstructive sleep apnea as well as morbid obesity, restrictive lung disease  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  Unsure why patient is on OPSUMIT and other pulmonary hypertension meds as these are not clinically indicated for WHO class II or III  Plan: We will request records from [facility] We will request records from [place] [facility] at this time Walk today in office to further evaluate oxygen needs May need to consider repeat pulmonary function testing or CT imaging in the future We will coordinate getting the patient set up with a sleep MD  for management of her severe obstructive sleep apnea  Complex care coordination Plan: We will request records from [facility] We will request records from [place]    Return in about 4 weeks (around 12/18/2019), or if symptoms worsen or fail to improve, for Follow up with Dr. Loanne Drilling.  I have spent a total time of 65  -minutes on the day of the appointment reviewing prior documentation, coordinating care and discussing medical diagnosis and plan with the patient/family. Imaging, labs and tests included in this note have been reviewed and interpreted independently by me.  Lauraine Rinne, MD [facility] 11/20/2019 1:57 PM  Office Number [phone]

## 2019-11-20 NOTE — Assessment & Plan Note (Signed)
Walk today in office to further evaluate oxygen needs  Plan: Continue oxygen therapy Maintain oxygen saturations above 88%

## 2019-11-20 NOTE — Assessment & Plan Note (Signed)
Plan: Walk today in office today Maintain oxygen saturations above 88 to 90% Patient likely would benefit from physical therapy, I have encouraged her to discuss this with her primary care provider May need to consider repeat CT imaging and pulmonary function testing, will basis decision off of previous records that we have requested today

## 2019-11-20 NOTE — Assessment & Plan Note (Signed)
Plan: We will request records from [facility] We will request records from [place]

## 2019-11-20 NOTE — Patient Instructions (Addendum)
You were seen today by Lauraine Rinne, NP  for:   1. Pulmonary hypertension (HCC)  Continue OPSUMIT Continue Revatio Continue Alyq  Continue oxygen therapy, maintain oxygen saturations above 88 to 90%  2. Chronic respiratory failure with hypoxia (HCC)  Walk today in office  >>>8L with exertion   Continue oxygen therapy as prescribed  >>>maintain oxygen saturations greater than 88 percent  >>>if unable to maintain oxygen saturations please contact the office  >>>do not smoke with oxygen  >>>can use nasal saline gel or nasal saline rinses to moisturize nose if oxygen causes dryness   3. OSA (obstructive sleep apnea)  We recommend that you continue using your CPAP daily >>>Keep up the hard work using your device >>> Goal should be wearing this for the entire night that you are sleeping, at least 4 to 6 hours  Remember:  . Do not drive or operate heavy machinery if tired or drowsy.  . Please notify the supply company and office if you are unable to use your device regularly due to missing supplies or machine being broken.  . Work on maintaining a healthy weight and following your recommended nutrition plan  . Maintain proper daily exercise and movement  . Maintaining proper use of your device can also help improve management of other chronic illnesses such as: Blood pressure, blood sugars, and weight management.   BiPAP/ CPAP Cleaning:  >>>Clean weekly, with Dawn soap, and bottle brush.  Set up to air dry. >>> Wipe mask out daily with wet wipe or towelette    4. Class 3 severe obesity with serious comorbidity and body mass index (BMI) of 45.0 to 49.9 in adult, unspecified obesity type ([place])  Keep close follow-up with primary care  I would recommend that you discuss with them if physical therapy referral to help with increasing your overall mobility  5. Stage 3 chronic kidney disease, unspecified whether stage 3a or 3b CKD  - Ambulatory referral to Nephrology  6. COVID-19  virus detected  We will continue to monitor you clinically for this  We may need to consider pulmonary function testing or repeat CT imaging given your diagnosis of COVID-19 in the past  7. Complex care coordination  We will [facility] records from [facility] We will request records from [facility]     We recommend today:  Orders Placed This Encounter  Procedures  . Ambulatory referral to Nephrology    Referral Priority:   Urgent    Referral Type:   Consultation    Referral Reason:   Specialty Services Required    Requested Specialty:   Nephrology    Number of Visits Requested:   1   Orders Placed This Encounter  Procedures  . Ambulatory referral to Nephrology   No orders of the defined types were placed in this encounter.   Follow Up:    Return in about 4 weeks (around 12/18/2019), or if symptoms worsen or fail to improve, for Follow up with Dr. Loanne Drilling.   Also schedule patient with sleep MD for sleep consult at earliest convenience for CPAP therapy / Severe Sleep Apnea   Please do your part to reduce the spread of COVID-19:      Reduce your risk of any infection  and COVID19 by using the similar precautions used for avoiding the common cold or flu:  Marland Kitchen Wash your hands often with soap and warm water for at least 20 seconds.  If soap and water are not readily available, use  an alcohol-based hand sanitizer with at least 60% alcohol.  . If coughing or sneezing, cover your mouth and nose by coughing or sneezing into the elbow areas of your shirt or coat, into a tissue or into your sleeve (not your hands). Langley Gauss A MASK when in public  . Avoid shaking hands with others and consider head nods or verbal greetings only. . Avoid touching your eyes, nose, or mouth with unwashed hands.  . Avoid close contact with people who are sick. . Avoid places or events with large numbers of people in one location, like concerts or sporting events. . If you have some symptoms but not all  symptoms, continue to monitor at home and seek medical attention if your symptoms worsen. . If you are having a medical emergency, call 911.   [place] / e-Visit: [email or web address]         [facility]: [facility]: [phone]                   [facility]: [phone]     It is flu season:   >>> Best ways to protect herself from the flu: Receive the yearly flu vaccine, practice good hand hygiene washing with soap and also using hand sanitizer when available, eat a nutritious meals, get adequate rest, hydrate appropriately   Please contact the office if your symptoms worsen or you have concerns that you are not improving.   Thank you for choosing [facility] for your healthcare, and for allowing [place] to partner with you on your healthcare journey. I am thankful to be able to provide care to you today.   Wyn Quaker FNP-C

## 2019-11-27 ENCOUNTER — Telehealth: Payer: Self-pay | Admitting: Pulmonary Disease

## 2019-11-27 NOTE — Telephone Encounter (Signed)
Called and spoke to pt. Informed her of the recs per Wyn Quaker, NP. Pt verbalized understanding. Pt states she has not heard about her nephrology referral, will route to [facility] to check on this. Pt also states she has not checked her pulse/ox but does not feel in distress, SOB or chest tightness. Advised pt to monitor her levels and to be sure she stays above 90% and to call us if this changes.  Will route to [place] as FYI.   PCC's can you check on nephrology referral, urgent referral was placed on 1/21. Thanks.

## 2019-11-27 NOTE — Telephone Encounter (Signed)
Thank you agreed   Wyn Quaker FNP

## 2019-11-27 NOTE — Telephone Encounter (Signed)
11/27/2019 1217  Since she is back to baseline, I believe we can clinically monitor the patient.  Is very important that the patient weigh herself every day right when she wakes up after going to the bathroom or attempting to go the bathroom.  This should be before getting dressed or eating or drinking anything.  She should be writing down these weights.  She can follow-up with our office at the beginning of next week reporting where her weights are at.  Also extremely important emphasized with the patient that she needs to follow a low-sodium diet.  She can also keep follow-up with primary care.  If patient's weights become increased again we may need to consider additional diuretics.  As the patient been scheduled for follow-up with nephrology yet?  She has chronic kidney disease stage III a referral was placed at her consult visit with our office on 11/20/2019.  How are her oxygen levels?  Keep planned follow-up in 12/16/2019 with Dr. Loanne Drilling.  We will route to Dr. Loanne Drilling as Juluis Rainier.  Wyn Quaker, FNP

## 2019-11-27 NOTE — Telephone Encounter (Signed)
Will send to [facility] NP as he saw patient .

## 2019-11-27 NOTE — Telephone Encounter (Signed)
In proficient, it says that the referral is under review.  I called & had to leave a message.  I will follow up in the morning

## 2019-11-27 NOTE — Telephone Encounter (Signed)
Spoke with the pt  She states that she had a hard time yesterday and felt very tired  Her weight was up to 260 yesterday (was 252 at last visit on 11/20/19) also and she noticed some swelling in her legs- esp the right  Her weight this morning was 258  She is feeling like her breathing is back to baseline and has more energy  She still has some minimal swelling  Wants to know if she should increase her lasix- currently takes 40 mg bid  She denies any fever, CP, chills, body aches or other symptoms  Please advise thanks!

## 2019-11-28 NOTE — Telephone Encounter (Signed)
As discussed previously?  Patient reported that she was doing better and back to baseline.  What is she referring to.  Unfortunate she is deciding to share this information with [place] at 4 PM on a Friday.  Has she followed up with primary care.  Is she weighing herself like we instructed?  Are her weight stable?  So much more information is required to further evaluate this appropriately and safely.  Wyn Quaker, FNP

## 2019-11-28 NOTE — Telephone Encounter (Signed)
Called and spoke with Patient. Patient stated she did weigh herself this morning and her weight was same as yesterdays weight.  Advised Patient to continue daily weights and contact provider if weight change 5 lbs or more. Patient stated she has PCP follow up 12/04/19.

## 2019-11-28 NOTE — Telephone Encounter (Signed)
Called pt and advised message from the provider. Pt understood and verbalized understanding. She wanted to let Aaron Edelman know that she is still having problems breathing as discussed previously.

## 2019-11-28 NOTE — Telephone Encounter (Signed)
Per [facility], they have rec'd the referral.  Their doctor looked at the referral & rated the patient a "4".  States the patient will be contacted to have this scheduled, but as a rating of "4" the patient is not rated urgent so it will take some time.

## 2019-11-28 NOTE — Telephone Encounter (Signed)
OK noted. Can we ensure the pt has been updated regarding this and that pt needs to follow up with PCP in the meantime for kidney management.   Wyn Quaker FNP

## 2019-11-28 NOTE — Telephone Encounter (Signed)
Forwarding to Aaron Edelman to make aware of referral status

## 2019-12-01 ENCOUNTER — Telehealth: Payer: Self-pay | Admitting: Pulmonary Disease

## 2019-12-01 NOTE — Telephone Encounter (Signed)
Okay.  Thank you for contacting the patient.  She can keep her follow-up with Dr. Loanne Drilling since she feels that her breathing is stable now.Dawn Quaker, FNP

## 2019-12-01 NOTE — Telephone Encounter (Signed)
Spoke with patient.  She was told she was a "4" in relation to the kidney referral. Patient thought this meant stage four and was worried. I explained to patient that this 4 meant not urgent and that you may not hear from the [facility] office immediately. Patient voiced understanding.  Nothing further needed  Patient also states she will keep her follow up on the 2/16, doesn't think she needs a follow up this week, her weight is down.   I will forward to [place] on no appt this week.

## 2019-12-02 ENCOUNTER — Other Ambulatory Visit: Payer: Self-pay | Admitting: Rehabilitation

## 2019-12-02 DIAGNOSIS — M5412 Radiculopathy, cervical region: Secondary | ICD-10-CM

## 2019-12-10 ENCOUNTER — Telehealth: Payer: Self-pay | Admitting: Pulmonary Disease

## 2019-12-10 NOTE — Telephone Encounter (Signed)
Lasix has been increased to tid due to pt retaining fluid and was told that she has fluid overflow. Pt stated she has not received her furosemide but stated she was going to call the pharmacy to make sure they had received the refill request for the furosemide so she can pick the med up.  Asked pt if she was completely out of both the Alyq and the Opsumit and pt stated that she has run completely out of both of those meds as there was information that was documented wrong on the forms for both meds.  Pt stated she cannot afford meds paying for them out of pocket as they are $3000 a month.   Called [facility] and spoke with Cassell Smiles to see if she could help [place] out with pt's meds. Per Cassell Smiles, the issue seems to be an insurance issue as pt is not covered through her insurance. Due to this, pt needs to contact her insurance.  Cassell Smiles stated when she ran the claims, the claims went through but it shows that pt has a high copay which may require pt to get pt assistance to help with payment for meds.  Pt can try [employer] at 5171840555, [employer] at [phone], or could try [employer] at [phone].    Called and spoke with pt and provided her with all the info I found out and she verbalized understanding. Stated to her if she had any issues to call us back and we would discuss with Dr. Loanne Drilling and she stated ok. Nothing further needed at this time.

## 2019-12-11 ENCOUNTER — Other Ambulatory Visit: Payer: Self-pay

## 2019-12-11 ENCOUNTER — Other Ambulatory Visit: Payer: Self-pay | Admitting: Family Medicine

## 2019-12-11 ENCOUNTER — Ambulatory Visit
Admit: 2019-12-11 | Discharge: 2019-12-11 | Disposition: A | Discharge status: Home / Self Care | Payer: Medicare Other | Attending: Family Medicine | Admitting: Family Medicine

## 2019-12-11 DIAGNOSIS — N63 Unspecified lump in unspecified breast: Secondary | ICD-10-CM

## 2019-12-11 DIAGNOSIS — Z1231 Encounter for screening mammogram for malignant neoplasm of breast: Secondary | ICD-10-CM

## 2019-12-13 ENCOUNTER — Other Ambulatory Visit: Payer: Medicare Other

## 2019-12-16 ENCOUNTER — Ambulatory Visit: Payer: Medicare Other | Admitting: Pulmonary Disease

## 2019-12-16 ENCOUNTER — Telehealth (HOSPITAL_COMMUNITY): Payer: Self-pay | Admitting: Vascular Surgery

## 2019-12-16 ENCOUNTER — Other Ambulatory Visit: Payer: Self-pay

## 2019-12-16 ENCOUNTER — Encounter: Payer: Self-pay | Admitting: Pulmonary Disease

## 2019-12-16 VITALS — BP 158/80 | HR 102 | Temp 97.9°F | Ht 61.0 in | Wt 277.8 lb

## 2019-12-16 DIAGNOSIS — G4733 Obstructive sleep apnea (adult) (pediatric): Secondary | ICD-10-CM

## 2019-12-16 DIAGNOSIS — I2729 Other secondary pulmonary hypertension: Secondary | ICD-10-CM | POA: Diagnosis not present

## 2019-12-16 MED ORDER — TORSEMIDE 20 MG PO TABS: 40.0000 mg | ORAL_TABLET | Freq: Two times a day (BID) | ORAL | 0 refills | Status: DC

## 2019-12-16 MED ORDER — POTASSIUM CHLORIDE ER 20 MEQ PO TBCR: EXTENDED_RELEASE_TABLET | ORAL | 0 refills | Status: DC

## 2019-12-16 MED ORDER — METOLAZONE 2.5 MG PO TABS: 2.5000 mg | ORAL_TABLET | Freq: Every day | ORAL | 0 refills | Status: DC

## 2019-12-16 NOTE — Progress Notes (Signed)
Subjective:   PATIENT ID: Dawn Fowler GENDER: female DOB: 06-21-1966, MRN: [id number]   Chief Complaint  Patient presents with  . Follow-up    weight gain, oxygen 8L when ambulating   Past medical history:pulmonary hypertension, hypertension, interstitial lung disease chronic kidney disease stage III morbid obesity   Reason for Visit: Follow-up visit  Dawn Fowler is a 54 year old female with severe OSA (not on CPAP), recent COVID-19 infection requiring hospitalization (12/30-1/4), stage III CKD  Of note, she reports that she is previously established at the [facility] in [place].  Reviewed note from 11/15/2018 by Dr. Kathrynn Humble, MD in pulmonary for pulmonary hypertension.  She was on Opsumit 10 mg daily, sildenafil 20 mg q8h and Procardia 60 mg daily.  She had a right heart cath done in March 2018 (RA 19 PA 76/40/48 PCWP 20 CO 4.02L/min CI 1.9 No vasodilator trial noted)and August 02 2018 however her numbers are cut off on fax. RVSP 57 and PVR 3.09 Wood units. She has a documented 6-minute walk test demonstrating 276 m on 8 L of O2 with SPO2 of 87%.  During these visits she has discussed bariatric surgery and weight loss.  CT Chest without contrast 07/26/18 -(report only) enlarged MPA and centroal pulmonary arteries consistent with hx of pulmonary hypertension.  Stable predominantly basilar mosaic attenuation reflects either small vessel or small airways disease.  Persistent predominant subcarinal, paraesophageal and bilateral infrahilar lymph node enlargement unchanged from 4 months ago.  Stable small perifissural nodule likely representing intranodular lymph node.  Other concerning parenchymal lesions not seen. -Report does not mention any interstitial lung disease.  VQ scan 07/26/2018 very low likelihood of pulmonary embolism  PFTs 07/18/2018 Difficult to read based on scan-comments state mild to moderate restrictive defect with moderate diffusion impairment.  No  significant improvement following albuterol.  Hypoxemic at rest.  Interval  Since our last visit she is compliant with her home O2 of 8L via [place] however she reports worsening dyspnea at rest and exertion. In the last 2.5 weeks, she has gained 18lbs. She is taking 60 mg twice daily and reports she is not urinating. She has run out of Opsumit and Alyq in the last week. This was delayed due to financial qualification but now that she is received aid, she expects medication will come this week.   Social: Unfortunately her mother passed away around that time and that is what prompted the patient eventually moving to [place].  She previously stayed with her son in a town outside [place], [place].  Patient reports now that she has moved to [facility] and is residing here.  Past Medical History:  Diagnosis Date  . CKD (chronic kidney disease), stage III   . HTN (hypertension)   . Pulmonary HTN ([place])   . Sleep apnea    on CPAP    Outpatient Medications Prior to Visit  Medication Sig Dispense Refill  . albuterol (VENTOLIN HFA) 108 (90 Base) MCG/ACT inhaler Inhale 2 puffs into the lungs every 6 (six) hours as needed for wheezing or shortness of breath. (Patient not taking: Reported on 11/20/2019) 6.7 g 0  . allopurinol (ZYLOPRIM) 100 MG tablet Take 100 mg by mouth daily.    [place] Main 20 MG tablet Take 20 mg by mouth daily.    . Cholecalciferol (VITAMIN D-3) 125 MCG (5000 UT) TABS Take 5,000 mg by mouth daily.    . colchicine 0.6 MG tablet Take 0.6 mg by mouth as  needed (Gout).    Marland Kitchen diclofenac Sodium (VOLTAREN) 1 % GEL Apply 2 g topically at bedtime.    . furosemide (LASIX) 40 MG tablet Take 40 mg by mouth 2 (two) times daily.    . hydrALAZINE (APRESOLINE) 50 MG tablet Take 1 tablet (50 mg total) by mouth every 8 (eight) hours. 90 tablet 0  . NIFEdipine (PROCARDIA XL/NIFEDICAL XL) 60 MG 24 hr tablet Take 60 mg by mouth daily.    . OPSUMIT 10 MG tablet Take 10 mg by mouth daily.    Marland Kitchen PREMARIN  0.625 MG tablet Take 0.625 mg by mouth daily.    . sildenafil (REVATIO) 20 MG tablet Take 20 mg by mouth daily.    Marland Kitchen telmisartan (MICARDIS) 40 MG tablet Take 20 mg by mouth daily.    . traMADol (ULTRAM) 50 MG tablet Take 50 mg by mouth every 6 (six) hours as needed for moderate pain.      No facility-administered medications prior to visit.    Review of Systems  Constitutional: Negative for chills, diaphoresis, fever, malaise/fatigue and weight loss.  HENT: Negative for congestion.   Respiratory: Positive for shortness of breath. Negative for cough, hemoptysis, sputum production and wheezing.   Cardiovascular: Negative for chest pain, palpitations and leg swelling.     Objective:   Vitals:   12/16/19 1353  BP: (!) 158/80  Pulse: (!) 102  Temp: 97.9 F (36.6 C)  TempSrc: Temporal  SpO2: 99%  Weight: 277 lb 12.8 oz (126 kg)  Height: 5\' 1"  (1.549 m)     Physical Exam: General: Obese, chronically ill-appearing, no acute distress HENT: [place], AT Eyes: EOMI, no scleral icterus Respiratory: Diminished breath sounds bilaterally. No crackles, wheezing or rales Cardiovascular: RRR, -M/R/G, no JVD Extremities:2+ pitting edema in lower extremities Neuro: AAO x4, CNII-XII grossly intact Psych: Normal mood, normal affect  Data Reviewed:  Imaging:  12/25/2014-VQ scan-mild obstructive airways disease, very low probability of PE 12/18/2014-CTA anterior-no evidence of PE, cardiomegaly, dependent airspace trapping which may be related to morbid obesity or small airways disease ^Unable to view images but can read radiology report  10/29/2019-chest x-ray-marked cardiomegaly with vascular congestion, dilated pulmonary arteries which can be seen in patients with elevated pulmonary pressures, no large focal infiltrate  PFT: 02/11/2015-office spirometry-FVC 2.14 L, FEV1 1.78, ratio 83  Labs:  12/21/2014-echocardiogram-LV ejection fraction 60 to 65%, moderate pulmonary hypertension, moderate  diastolic dysfunction  Walk:  12/16/2019 -tolerated walk in office, patient required 8 L of O2 to maintain oxygen saturations above 88%     Assessment & Plan:   Discussion: 54 year old female with severe OSA and secondary pulmonary hypertension who presents for follow-up. On evaluation, she has decompensated pulmonary hypertension and failing outpatient diuretic management with increased dyspnea, increased weight gain, worsening lower extremity edema and minimal UOP despite increased lasix regimen. I recommended hospitalization for further management and discussed case with Dr. Haroldine Laws who specializes in Heart Failure/Pulmonary Hypertension. Patient has declined this recommendation, expressing understanding that her symptoms can worsen and can result in respiratory and/or cardiac failure. RN Reynolds in room with me during this conversation.   We have discussed outpatient plan with the understanding that if she worsens, she is to present to the ED for further management.  Decompensated Pulmonary Hypertension STOP lasix For TWO DAYS, take Metolazone 2.5 mg @7am   For TWO DAYS, take Kdur 40 mEq  @7am    Take Torsemide 40 mg @7 :30am and @2pm  Take Kdur 60mEq 20 mg @7 :30am and @2pm   We will arrange for you to be seen in Pulmonary Hypertension clinic on Thursday or Friday. Please call our office if you are not scheduled by tomorrow.   Health Maintenance Immunization History  Administered Date(s) Administered  . Influenza Whole 08/20/2019  . Influenza,inj,Quad PF,6+ Mos 06/25/2018  . Pneumococcal Polysaccharide-23 07/17/2014   CT Lung Screen -never smoker, would not qualify  I have spent a total time of 60-minutes on the day of the appointment reviewing prior documentation, coordinating care and discussing medical diagnosis and plan with the patient/family. Imaging, labs and tests included in this note have been reviewed and interpreted independently by me.   [place], MD [facility] 12/16/2019 1:35 PM  Office Number [phone]

## 2019-12-16 NOTE — Patient Instructions (Addendum)
Decompensated Pulmonary Hypertension STOP lasix For TWO DAYS, take Metolazone 2.5 mg @7am   For TWO DAYS, take Kdur 40 mEq  @7am    Take Torsemide 40 mg @7 :30am and @2pm  Take Kdur 52mEq 20 mg @7 :30am and @2pm   We will arrange for you to be seen in Pulmonary Hypertension clinic on Thursday or Friday. Please call our office if you are not scheduled by tomorrow.

## 2019-12-16 NOTE — Telephone Encounter (Signed)
Left pt message , giving new pt appt  12/18/19 @ 9:30AM

## 2019-12-17 ENCOUNTER — Other Ambulatory Visit: Payer: Self-pay

## 2019-12-17 ENCOUNTER — Encounter (HOSPITAL_COMMUNITY): Payer: Self-pay

## 2019-12-17 ENCOUNTER — Telehealth: Payer: Self-pay | Admitting: Pulmonary Disease

## 2019-12-17 ENCOUNTER — Encounter: Payer: Self-pay | Admitting: Pulmonary Disease

## 2019-12-17 ENCOUNTER — Emergency Department (HOSPITAL_COMMUNITY): Payer: Medicare Other

## 2019-12-17 ENCOUNTER — Inpatient Hospital Stay (HOSPITAL_COMMUNITY)
Admission: EM | Admit: 2019-12-17 | Discharge: 2019-12-23 | DRG: 286 | Disposition: A | Discharge status: Home / Self Care | Payer: Medicare Other | Attending: Internal Medicine | Admitting: Internal Medicine

## 2019-12-17 DIAGNOSIS — Z825 Family history of asthma and other chronic lower respiratory diseases: Secondary | ICD-10-CM

## 2019-12-17 DIAGNOSIS — I371 Nonrheumatic pulmonary valve insufficiency: Secondary | ICD-10-CM | POA: Diagnosis not present

## 2019-12-17 DIAGNOSIS — N183 Chronic kidney disease, stage 3 unspecified: Secondary | ICD-10-CM | POA: Diagnosis present

## 2019-12-17 DIAGNOSIS — I2721 Secondary pulmonary arterial hypertension: Secondary | ICD-10-CM | POA: Diagnosis present

## 2019-12-17 DIAGNOSIS — J9621 Acute and chronic respiratory failure with hypoxia: Secondary | ICD-10-CM | POA: Diagnosis present

## 2019-12-17 DIAGNOSIS — I272 Pulmonary hypertension, unspecified: Secondary | ICD-10-CM | POA: Diagnosis present

## 2019-12-17 DIAGNOSIS — I130 Hypertensive heart and chronic kidney disease with heart failure and stage 1 through stage 4 chronic kidney disease, or unspecified chronic kidney disease: Secondary | ICD-10-CM | POA: Diagnosis present

## 2019-12-17 DIAGNOSIS — R0600 Dyspnea, unspecified: Secondary | ICD-10-CM

## 2019-12-17 DIAGNOSIS — N179 Acute kidney failure, unspecified: Secondary | ICD-10-CM | POA: Diagnosis present

## 2019-12-17 DIAGNOSIS — Z9981 Dependence on supplemental oxygen: Secondary | ICD-10-CM

## 2019-12-17 DIAGNOSIS — I5033 Acute on chronic diastolic (congestive) heart failure: Secondary | ICD-10-CM | POA: Diagnosis present

## 2019-12-17 DIAGNOSIS — I50810 Right heart failure, unspecified: Secondary | ICD-10-CM | POA: Diagnosis not present

## 2019-12-17 DIAGNOSIS — G4733 Obstructive sleep apnea (adult) (pediatric): Secondary | ICD-10-CM | POA: Diagnosis present

## 2019-12-17 DIAGNOSIS — J439 Emphysema, unspecified: Secondary | ICD-10-CM | POA: Diagnosis present

## 2019-12-17 DIAGNOSIS — I361 Nonrheumatic tricuspid (valve) insufficiency: Secondary | ICD-10-CM | POA: Diagnosis not present

## 2019-12-17 DIAGNOSIS — Z8616 Personal history of COVID-19: Secondary | ICD-10-CM

## 2019-12-17 DIAGNOSIS — R609 Edema, unspecified: Secondary | ICD-10-CM

## 2019-12-17 DIAGNOSIS — R0609 Other forms of dyspnea: Secondary | ICD-10-CM

## 2019-12-17 DIAGNOSIS — Z6841 Body Mass Index (BMI) 40.0 and over, adult: Secondary | ICD-10-CM

## 2019-12-17 DIAGNOSIS — N1832 Chronic kidney disease, stage 3b: Secondary | ICD-10-CM | POA: Diagnosis present

## 2019-12-17 DIAGNOSIS — E669 Obesity, unspecified: Secondary | ICD-10-CM | POA: Diagnosis present

## 2019-12-17 DIAGNOSIS — Z20822 Contact with and (suspected) exposure to covid-19: Secondary | ICD-10-CM | POA: Diagnosis present

## 2019-12-17 DIAGNOSIS — Z8249 Family history of ischemic heart disease and other diseases of the circulatory system: Secondary | ICD-10-CM | POA: Diagnosis not present

## 2019-12-17 DIAGNOSIS — Z9119 Patient's noncompliance with other medical treatment and regimen: Secondary | ICD-10-CM | POA: Diagnosis not present

## 2019-12-17 DIAGNOSIS — Z91048 Other nonmedicinal substance allergy status: Secondary | ICD-10-CM

## 2019-12-17 DIAGNOSIS — R079 Chest pain, unspecified: Secondary | ICD-10-CM

## 2019-12-17 DIAGNOSIS — K760 Fatty (change of) liver, not elsewhere classified: Secondary | ICD-10-CM | POA: Diagnosis present

## 2019-12-17 DIAGNOSIS — Z86711 Personal history of pulmonary embolism: Secondary | ICD-10-CM | POA: Diagnosis not present

## 2019-12-17 DIAGNOSIS — R0602 Shortness of breath: Secondary | ICD-10-CM | POA: Diagnosis present

## 2019-12-17 DIAGNOSIS — Z86718 Personal history of other venous thrombosis and embolism: Secondary | ICD-10-CM | POA: Diagnosis not present

## 2019-12-17 DIAGNOSIS — I5082 Biventricular heart failure: Secondary | ICD-10-CM | POA: Diagnosis present

## 2019-12-17 DIAGNOSIS — Z87891 Personal history of nicotine dependence: Secondary | ICD-10-CM

## 2019-12-17 DIAGNOSIS — Z9989 Dependence on other enabling machines and devices: Secondary | ICD-10-CM | POA: Diagnosis present

## 2019-12-17 DIAGNOSIS — Z888 Allergy status to other drugs, medicaments and biological substances status: Secondary | ICD-10-CM | POA: Diagnosis not present

## 2019-12-17 DIAGNOSIS — I509 Heart failure, unspecified: Secondary | ICD-10-CM | POA: Insufficient documentation

## 2019-12-17 DIAGNOSIS — J9601 Acute respiratory failure with hypoxia: Secondary | ICD-10-CM | POA: Diagnosis not present

## 2019-12-17 DIAGNOSIS — U071 COVID-19: Secondary | ICD-10-CM

## 2019-12-17 LAB — BASIC METABOLIC PANEL
Anion gap: 12 (ref 5–15)
BUN: 37 mg/dL — ABNORMAL HIGH (ref 6–20)
CO2: 17 mmol/L — ABNORMAL LOW (ref 22–32)
Calcium: 9 mg/dL (ref 8.9–10.3)
Chloride: 109 mmol/L (ref 98–111)
Creatinine, Ser: 2.11 mg/dL — ABNORMAL HIGH (ref 0.44–1.00)
GFR calc Af Amer: 30 mL/min — ABNORMAL LOW (ref >60)
GFR calc non Af Amer: 26 mL/min — ABNORMAL LOW (ref >60)
Glucose, Bld: 94 mg/dL (ref 70–99)
Potassium: 4.6 mmol/L (ref 3.5–5.1)
Sodium: 138 mmol/L (ref 135–145)

## 2019-12-17 LAB — CBC WITH DIFFERENTIAL/PLATELET
Abs Immature Granulocytes: 0.01 10*3/uL (ref 0.00–0.07)
Basophils Absolute: 0 10*3/uL (ref 0.0–0.1)
Basophils Relative: 1 %
Eosinophils Absolute: 0.1 10*3/uL (ref 0.0–0.5)
Eosinophils Relative: 1 %
HCT: 45.3 % (ref 36.0–46.0)
Hemoglobin: 13.8 g/dL (ref 12.0–15.0)
Immature Granulocytes: 0 %
Lymphocytes Relative: 29 %
Lymphs Abs: 1.6 10*3/uL (ref 0.7–4.0)
MCH: 30.9 pg (ref 26.0–34.0)
MCHC: 30.5 g/dL (ref 30.0–36.0)
MCV: 101.6 fL — ABNORMAL HIGH (ref 80.0–100.0)
Monocytes Absolute: 0.4 10*3/uL (ref 0.1–1.0)
Monocytes Relative: 7 %
Neutro Abs: 3.5 10*3/uL (ref 1.7–7.7)
Neutrophils Relative %: 62 %
Platelets: 136 10*3/uL — ABNORMAL LOW (ref 150–400)
RBC: 4.46 MIL/uL (ref 3.87–5.11)
RDW: 15.9 % — ABNORMAL HIGH (ref 11.5–15.5)
WBC: 5.6 10*3/uL (ref 4.0–10.5)
nRBC: 0 % (ref 0.0–0.2)

## 2019-12-17 LAB — RESPIRATORY PANEL BY RT PCR (FLU A&B, COVID)
Influenza A by PCR: NEGATIVE
Influenza B by PCR: NEGATIVE
SARS Coronavirus 2 by RT PCR: NEGATIVE

## 2019-12-17 LAB — TROPONIN I (HIGH SENSITIVITY)
Troponin I (High Sensitivity): 22 ng/L — ABNORMAL HIGH (ref <18)
Troponin I (High Sensitivity): 21 ng/L — ABNORMAL HIGH (ref <18)

## 2019-12-17 LAB — BRAIN NATRIURETIC PEPTIDE: B Natriuretic Peptide: 958.5 pg/mL — ABNORMAL HIGH (ref 0.0–100.0)

## 2019-12-17 MED ORDER — ALBUTEROL SULFATE (2.5 MG/3ML) 0.083% IN NEBU: 5.0000 mg | INHALATION_SOLUTION | Freq: Once | RESPIRATORY_TRACT | Status: DC

## 2019-12-17 MED ORDER — FUROSEMIDE 10 MG/ML IJ SOLN
80.0000 mg | Freq: Once | INTRAMUSCULAR | Status: AC
  Administered 2019-12-17: 19:00:00 80 mg via INTRAVENOUS
  Filled 2019-12-17: qty 8

## 2019-12-17 MED ORDER — ALBUTEROL SULFATE HFA 108 (90 BASE) MCG/ACT IN AERS: 2.0000 | INHALATION_SPRAY | Freq: Once | RESPIRATORY_TRACT | Status: DC

## 2019-12-17 NOTE — ED Notes (Signed)
PT refused to have IV in [facility] on either arm. Attempted to star IV in LT anterior fore arm . Site infiltrated.

## 2019-12-17 NOTE — Telephone Encounter (Signed)
Routing message to Dr. Haroldine Laws in HF/PH department. Patient is going to ED today.

## 2019-12-17 NOTE — ED Provider Notes (Signed)
[facility] EMERGENCY DEPARTMENT Provider Note   CSN: [id number] Arrival date & time: 12/17/19  1333   History Chief Complaint  Patient presents with  . Shortness of Breath    Dawn Fowler is a 54 y.o. female with history of pulmonary hypertension, chronic kidney disease, sleep apnea who presents with shortness of breath.  Patient has chronic respiratory failure and is on 6 to 8 L of O2 all the time.  She states that over the past several weeks she has had increasing shortness of breath with exertion.  She also has noticed increased swelling in her legs and states that her clothes do not fit anymore.  She has been monitoring her weight and over the past 2 weeks she has had 20 pound weight gain.  She has been taking an increased dose of Lasix for the past couple of weeks and denies any significant urinary output.  She saw her pulmonologist yesterday and was given prescription for metolazone and torsemide.  She started these medicines today. She was recommended to come to the emergency department yesterday for decompensated pulmonary hypertension and failure of outpatient management but needed to take care of some things at home first. She states that yesterday after leaving the clinic she felt like she was going to die. She denies fever or significant cough.  She did have Covid last month and was hospitalized at that time.  She states that she gets squeezing chest pain when she gets very short of breath with exertion which resolved with rest.  Denies any current chest pain.  HPI     Past Medical History:  Diagnosis Date  . Acute respiratory failure due to COVID-19 ([place]) 10/30/2019  . CKD (chronic kidney disease), stage III   . HTN (hypertension)   . Pulmonary HTN ([place])   . Sleep apnea    on CPAP    Patient Active Problem List   Diagnosis Date Noted  . Chronic respiratory failure with hypoxia ([place]) 11/20/2019  . OSA (obstructive sleep apnea) 11/20/2019  . COVID-19  virus detected 11/20/2019  . Complex care coordination 11/20/2019  . Obesity 11/20/2019  . Acute respiratory failure with hypoxia ([place]) 10/30/2019  . Pulmonary hypertension ([place]) 10/30/2019  . Gout 10/30/2019  . CKD (chronic kidney disease) stage 3, GFR 30-59 ml/min 10/30/2019    Past Surgical History:  Procedure Laterality Date  . CESAREAN SECTION    . lumbar surgery       OB History   No obstetric history on file.     Family History  Problem Relation Age of Onset  . COPD Father   . Hypertension Brother   . Pulmonary Hypertension Neg Hx     Social History   Tobacco Use  . Smoking status: Former Research scientist (life sciences)  . Smokeless tobacco: Never Used  . Tobacco comment: only smoked for fun in highschool not even one a day  Substance Use Topics  . Alcohol use: Not Currently  . Drug use: Not Currently    Home Medications Prior to Admission medications   Medication Sig Start Date End Date Taking? Authorizing Provider  albuterol (VENTOLIN HFA) 108 (90 Base) MCG/ACT inhaler Inhale 2 puffs into the lungs every 6 (six) hours as needed for wheezing or shortness of breath. 11/03/19   Thurnell Lose, MD  allopurinol (ZYLOPRIM) 100 MG tablet Take 100 mg by mouth daily.    [provider]  ALYQ 20 MG tablet Take 20 mg by mouth daily. 09/24/19   [provider]  Cholecalciferol (VITAMIN D-3) 125 MCG (5000 UT) TABS Take 5,000 mg by mouth daily.    [provider]  colchicine 0.6 MG tablet Take 0.6 mg by mouth as needed (Gout).    [provider]  diclofenac Sodium (VOLTAREN) 1 % GEL Apply 2 g topically at bedtime. 10/20/19   [provider]  furosemide (LASIX) 40 MG tablet Take 40 mg by mouth 2 (two) times daily. 10/07/19   [provider]  hydrALAZINE (APRESOLINE) 50 MG tablet Take 1 tablet (50 mg total) by mouth every 8 (eight) hours. 11/03/19   Thurnell Lose, MD  metolazone (ZAROXOLYN) 2.5 MG tablet Take 1 tablet (2.5 mg total) by mouth  daily. 12/16/19   Margaretha Seeds, MD  NIFEdipine (PROCARDIA XL/NIFEDICAL XL) 60 MG 24 hr tablet Take 60 mg by mouth daily.    [provider]  OPSUMIT 10 MG tablet Take 10 mg by mouth daily. 09/22/19   [provider]  potassium chloride 20 MEQ TBCR -Take 20 mEQ twice a day. -Take an additional 76 mEQ with metolazone 12/16/19   Margaretha Seeds, MD  PREMARIN 0.625 MG tablet Take 0.625 mg by mouth daily. 09/01/19   [provider]  sildenafil (REVATIO) 20 MG tablet Take 20 mg by mouth daily.    [provider]  telmisartan (MICARDIS) 40 MG tablet Take 20 mg by mouth daily. 10/07/19   [provider]  torsemide (DEMADEX) 20 MG tablet Take 2 tablets (40 mg total) by mouth 2 (two) times daily for 5 days. 12/16/19 12/21/19  Margaretha Seeds, MD  traMADol (ULTRAM) 50 MG tablet Take 50 mg by mouth every 6 (six) hours as needed for moderate pain.  10/20/19   [provider]    Allergies    Adhesive [tape], Succinylcholine, and Tizanidine  Review of Systems   Review of Systems  Constitutional: Positive for fatigue. Negative for chills and fever.  Respiratory: Positive for shortness of breath. Negative for cough and wheezing.   Cardiovascular: Positive for chest pain and leg swelling.  Gastrointestinal: Negative for abdominal pain, nausea and vomiting.  Genitourinary: Positive for difficulty urinating. Negative for frequency.  Neurological: Negative for syncope and light-headedness.  All other systems reviewed and are negative.   Physical Exam Updated Vital Signs BP (!) 138/112 (BP Location: Left Arm)   Pulse (!) 102   Temp 97.6 F (36.4 C) (Oral)   Resp (!) 24   Ht 5\' 1"  (1.549 m)   Wt 125.6 kg   SpO2 (!) 88%   BMI 52.34 kg/m   Physical Exam Vitals and nursing note reviewed.  Constitutional:      General: She is not in acute distress.    Appearance: She is well-developed. She is obese. She is not ill-appearing.     Comments:  Calm, cooperative. Talking in full sentences. On 8L via [place]  HENT:     Head: Normocephalic and atraumatic.  Eyes:     General: No scleral icterus.       Right eye: No discharge.        Left eye: No discharge.     Conjunctiva/sclera: Conjunctivae normal.     Pupils: Pupils are equal, round, and reactive to light.  Cardiovascular:     Rate and Rhythm: Normal rate and regular rhythm.  Pulmonary:     Effort: Pulmonary effort is normal. No respiratory distress.     Breath sounds: Normal breath sounds.  Abdominal:     General:  There is no distension.     Palpations: Abdomen is soft.     Tenderness: There is no abdominal tenderness.  Musculoskeletal:     Cervical back: Normal range of motion.     Right lower leg: Edema (pitting edema to the thigh) present.     Left lower leg: Edema present.  Skin:    General: Skin is warm and dry.  Neurological:     Mental Status: She is alert and oriented to person, place, and time.  Psychiatric:        Behavior: Behavior normal.     ED Results / Procedures / Treatments   Labs (all labs ordered are listed, but only abnormal results are displayed) Labs Reviewed  BASIC METABOLIC PANEL - Abnormal; Notable for the following components:      Result Value   CO2 17 (*)    BUN 37 (*)    Creatinine, Ser 2.11 (*)    GFR calc non Af Amer 26 (*)    GFR calc Af Amer 30 (*)    All other components within normal limits  CBC WITH DIFFERENTIAL/PLATELET - Abnormal; Notable for the following components:   MCV 101.6 (*)    RDW 15.9 (*)    Platelets 136 (*)    All other components within normal limits  BRAIN NATRIURETIC PEPTIDE - Abnormal; Notable for the following components:   B Natriuretic Peptide 958.5 (*)    All other components within normal limits  TROPONIN I (HIGH SENSITIVITY) - Abnormal; Notable for the following components:   Troponin I (High Sensitivity) 22 (*)    All other components within normal limits  RESPIRATORY PANEL BY RT PCR (FLU A&B,  COVID)  TROPONIN I (HIGH SENSITIVITY)    EKG EKG Interpretation  Date/Time:  Wednesday December 17 2019 13:37:14 EST Ventricular Rate:  96 PR Interval:  170 QRS Duration: 78 QT Interval:  364 QTC Calculation: 459 R Axis:   128 Text Interpretation: Sinus rhythm with occasional Premature ventricular complexes Right axis deviation Cannot rule out Anterior infarct , age undetermined Abnormal ECG since last tracing no significant change Confirmed by Noemi Chapel [phone]) on 12/17/2019 4:03:14 PM   Radiology DG Chest 2 View  Result Date: 12/17/2019 CLINICAL DATA:  Shortness of breath.  Fluid retention. EXAM: CHEST - 2 VIEW COMPARISON:  Single-view of the chest 10/29/2019. FINDINGS: There is cardiomegaly without edema. No consolidative process, pneumothorax or effusion. No acute or focal bony abnormality. IMPRESSION: Cardiomegaly without acute disease. Electronically Signed   By: Inge Rise M.D.   On: 12/17/2019 14:31    Procedures Procedures (including critical care time)  Medications Ordered in ED Medications  furosemide (LASIX) injection 80 mg (80 mg Intravenous Given 12/17/19 1903)    ED Course  I have reviewed the triage vital signs and the nursing notes.  Pertinent labs & imaging results that were available during my care of the patient were reviewed by me and considered in my medical decision making (see chart for details).  54 year old female presents with worsening SOB with exertion and 20 pound weight gain at home despite diuretic use. BP is elevated here. Initial O2 sats in triage were 88% and HR was elevated however on my exam both of these are normal on her 8L. O2 was decreased to 6L and she was still maintaining O2 sats. EKG shows SR with PVCs. CXR is negative for pulmonary edema. CBC shows elevated MCV and thrombocytopenia. BMP shows mild AKI with BUN 37 and SCr  2.1. BNP is 958 and trop is minimally elevated to 22 which is likely from heart strain. IV Lasix  ordered.  Shared visit with Dr. Sabra Heck. Will admit due to symptomatic SOB with exertion and failure of outpatient therapy. Spoke with Dr Roel Cluck with [facility] who requests cardiology consult. COVID swab ordered (pt was admitted for COVID on 12/30 but had negative COVID test).  Discussed with Dr. Hassell Done with Cards - she recommends diuresis, obtain echo and they will see in the AM.   MDM Rules/Calculators/A&P                       Final Clinical Impression(s) / ED Diagnoses Final diagnoses:  Pulmonary hypertension ([place])  Exertional dyspnea  Pitting edema    Rx / DC Orders ED Discharge Orders    None       Recardo Evangelist, PA-C 12/17/19 2140    Noemi Chapel, MD 12/19/19 1315

## 2019-12-17 NOTE — Telephone Encounter (Signed)
Noted. Will send message to Dr. Loanne Drilling to make her aware.

## 2019-12-17 NOTE — ED Triage Notes (Signed)
Pt arrives POV for eval of shortness of breath and fluid retention. Pt reports she was seen at pulm clinic yesterday and they wanted her to come here but she was unable to do so yesterday. Pt has hx of pulm hypertension, wears 6-8LPM at all times. States increased dyspnea on exertion and 20lb weight gain the last 2 weeks.

## 2019-12-17 NOTE — ED Notes (Signed)
This writer explained to Pt that by refusing IV in [facility] is slowing down her care. Iv team consult

## 2019-12-17 NOTE — ED Provider Notes (Signed)
Pt has hx of pulmonary  Hypertension - on O2 chronically Sent for admission for diuresis - and found to be hypoxic on arrival, Has peripheral edema - 1+ symmetrical.  CXR without acute findings. Labs pending - plan for admit / diuresis / cards consult.  Ultimately the patient states that while she was walking out of her office yesterday she felt so short of breath she thought she was going to die and "meet my maker", she says that her exertional dyspnea is so severe she came to the hospital today.  On my exam she does have 1+ pitting edema, she also has mild dyspnea and speaks in just short and sentences.  I personally looked at her x-ray and see no signs of edema or infiltrate.  Plan is for admission  Ultrasound ED Peripheral IV (Provider)  Date/Time: 12/17/2019 6:25 PM Performed by: Noemi Chapel, MD Authorized by: Noemi Chapel, MD   Procedure details:    Indications: multiple failed IV attempts and poor IV access     Skin Prep: chlorhexidine gluconate     Location: left upper arm.   Angiocath:  20 G   Bedside Ultrasound Guided: Yes     Images: not archived     Patient tolerated procedure without complications: Yes     Dressing applied: Yes   Comments:            EKG Interpretation  Date/Time:  Wednesday December 17 2019 13:37:14 EST Ventricular Rate:  96 PR Interval:  170 QRS Duration: 78 QT Interval:  364 QTC Calculation: 459 R Axis:   128 Text Interpretation: Sinus rhythm with occasional Premature ventricular complexes Right axis deviation Cannot rule out Anterior infarct , age undetermined Abnormal ECG since last tracing no significant change Confirmed by Noemi Chapel [phone]) on 12/17/2019 4:03:14 PM       Medical screening examination/treatment/procedure(s) were conducted as a shared visit with non-physician practitioner(s) and myself.  I personally evaluated the patient during the encounter.  Clinical Impression:   Final diagnoses:  Pulmonary hypertension  ([place])  Exertional dyspnea  Pitting edema         Noemi Chapel, MD 12/19/19 1315

## 2019-12-17 NOTE — H&P (Signed)
Dawn Fowler OBS:[id number] DOB: Nov 14, 1965 DOA: 12/17/2019     PCP: Hayden Rasmussen, MD   Outpatient Specialists:   CARDS:   Supposed to establish care with Dr. Haroldine Laws   Pulmonary    Dr.Jane Loanne Drilling, M.D   Patient arrived to ER on 12/17/19 at 1333  Patient coming from: home Lives alone,   Chief Complaint:   Chief Complaint  Patient presents with  . Shortness of Breath    HPI: Frankee Gritz is a 54 y.o. female with medical history significant of OSA, secondary pulmonary hypertension,at baseline on 8L of O2 restrictive lung disease and chronic hypoxemic respiratory failure   recently hospitalized for COVID-19 pneumonia,   Presented with   significantly worsening shortness of breath at baseline states that she can walk just a few steps and gets significantly short of breath stated that she feels like she is about to die and "meet my Mama" Patient reports that she has gained at least 10 pounds or more over the past month or so Recently her Lasix was increased and she was started on metolazone she only took 1 dose. Pulmonology recommended for her to come to emergency department and be diuresed.  She was supposed to be seen by cardiology a message was sent to Dr. Haroldine Laws who is aware.  COVID 19 infection - discharged 11/03/19 which noted treated with steroids and remdesivir.  History of interstitial lung disease  June 2020 seen in [facility] in [place] History of severe sleep apnea noncompliant with CPAP likely  Infectious risk factors:  Reports shortness of breath    In  ER   COVID TEST  NEGATIVE      Lab Results  Component Value Date   [place] NEGATIVE 12/17/2019     Regarding pertinent Chronic problems      HTN on hydralazine Procardia, Micardis   chronic CHF diastolic on Lasix  Pulmonary hypertension on [facility]. [place] could not afford other medications       Morbid obesity-   BMI Readings from Last 1 Encounters:  12/17/19 52.34 kg/m    OSA - noncompliant with CPAP   CKD stage III - baseline Cr 1.5 Lab Results  Component Value Date   CREATININE 2.11 (H) 12/17/2019   CREATININE 1.49 (H) 11/03/2019   CREATININE 1.53 (H) 11/02/2019    While in ER: Patient was given 80 mg of Lasix IV and diuresed with some improvement   ER Provider Called:     Dr. Hassell Done They Recommend admit to medicine diuresis and order echo Will see in AM     The following Work up has been ordered so far:  Orders Placed This Encounter  Procedures  . ED FAST [place] BEDSIDE  . Respiratory Panel by RT PCR (Flu A&B, Covid) - Nasopharyngeal Swab  . DG Chest 2 View  . Basic metabolic panel  . CBC with Differential  . Brain natriuretic peptide  . If O2 Sat <94% administer O2 at 2 liters/minute via nasal cannula  . Cardiac monitoring  . Initiate Carrier Fluid Protocol  . Consult for [place] Admission  ALL PATIENTS BEING ADMITTED/HAVING PROCEDURES NEED COVID-19 SCREENING  . Inpatient consult to Cardiology  ALL PATIENTS BEING ADMITTED/HAVING PROCEDURES NEED COVID-19 SCREENING  . Pulse oximetry, continuous  . Pulse oximetry (single)  . EKG 12-Lead  . ED EKG  . Insert peripheral IV    Following Medications were ordered in ER: Medications  furosemide (LASIX) injection 80 mg (has no administration in time range)  Significant initial  Findings: Abnormal Labs Reviewed  BASIC METABOLIC PANEL - Abnormal; Notable for the following components:      Result Value   CO2 17 (*)    BUN 37 (*)    Creatinine, Ser 2.11 (*)    GFR calc non Af Amer 26 (*)    GFR calc Af Amer 30 (*)    All other components within normal limits  CBC WITH DIFFERENTIAL/PLATELET - Abnormal; Notable for the following components:   MCV 101.6 (*)    RDW 15.9 (*)    Platelets 136 (*)    All other components within normal limits  BRAIN NATRIURETIC PEPTIDE - Abnormal; Notable for the following components:   B Natriuretic Peptide 958.5 (*)    All other components  within normal limits  TROPONIN I (HIGH SENSITIVITY) - Abnormal; Notable for the following components:   Troponin I (High Sensitivity) 22 (*)    All other components within normal limits     Otherwise labs showing:    Recent Labs  Lab 12/17/19 1655  NA 138  K 4.6  CO2 17*  GLUCOSE 94  BUN 37*  CREATININE 2.11*  CALCIUM 9.0    Cr    Up from baseline see below Lab Results  Component Value Date   CREATININE 2.11 (H) 12/17/2019   CREATININE 1.49 (H) 11/03/2019   CREATININE 1.53 (H) 11/02/2019    No results for input(s): AST, ALT, ALKPHOS, BILITOT, PROT, ALBUMIN in the last 168 hours. Lab Results  Component Value Date   CALCIUM 9.0 12/17/2019      WBC      Component Value Date/Time   WBC 5.6 12/17/2019 1655   ANC    Component Value Date/Time   NEUTROABS 3.5 12/17/2019 1655   ALC No components found for: LYMPHAB    Plt: Lab Results  Component Value Date   PLT 136 (L) 12/17/2019     COVID-19 Labs  No results for input(s): DDIMER, FERRITIN, LDH, CRP in the last 72 hours.  Lab Results  Component Value Date   SARSCOV2NAA NEGATIVE 12/17/2019     HG/HCT  Stable,     Component Value Date/Time   HGB 13.8 12/17/2019 1655   HCT 45.3 12/17/2019 1655       Troponin 22     ECG: Ordered Personally reviewed by me showing: HR : 96 Rhythm:  NSR,  w PVC   no evidence of ischemic changes QTC 459   BNP (last 3 results) Recent Labs    11/02/19 0020 11/03/19 0445 12/17/19 1655  BNP 29.8 25.1 958.5*     UA not ordered   Ordered     CXR - cardiomegaly       ED Triage Vitals  Enc Vitals Group     BP 12/17/19 1350 (!) 138/112     Pulse Rate 12/17/19 1350 (!) 102     Resp 12/17/19 1350 (!) 24     Temp 12/17/19 1350 97.6 F (36.4 C)     Temp Source 12/17/19 1350 Oral     SpO2 12/17/19 1350 (!) 88 %     Weight 12/17/19 1402 277 lb (125.6 kg)     Height 12/17/19 1402 5\' 1"  (1.549 m)     Head Circumference --      Peak Flow --      Pain Score  12/17/19 1401 5     Pain Loc --      Pain Edu? --      Excl.  in [place]? --   TMAX(24)@       Latest  Blood pressure (!) 138/112, pulse (!) 102, temperature 97.6 F (36.4 C), temperature source Oral, resp. rate (!) 24, height 5\' 1"  (1.549 m), weight 125.6 kg, SpO2 (!) 88 %.     Hospitalist was called for admission for AKI in the setting of fluid overload and acute on chronic respiratory failure with hypoxia   Review of Systems:    Pertinent positives include: shortness of breath at rest No dyspnea on exertion,   Constitutional:  No weight loss, night sweats, Fevers, chills, fatigue, weight loss  HEENT:  No headaches, Difficulty swallowing,Tooth/dental problems,Sore throat,  No sneezing, itching, ear ache, nasal congestion, post nasal drip,  Cardio-vascular:  No chest pain, Orthopnea, PND, anasarca, dizziness, palpitations.no Bilateral lower extremity swelling  GI:  No heartburn, indigestion, abdominal pain, nausea, vomiting, diarrhea, change in bowel habits, loss of appetite, melena, blood in stool, hematemesis Resp:   No excess mucus, no productive cough, No non-productive cough, No coughing up of blood.No change in color of mucus.No wheezing. Skin:  no rash or lesions. No jaundice GU:  no dysuria, change in color of urine, no urgency or frequency. No straining to urinate.  No flank pain.  Musculoskeletal:  No joint pain or no joint swelling. No decreased range of motion. No back pain.  Psych:  No change in mood or affect. No depression or anxiety. No memory loss.  Neuro: no localizing neurological complaints, no tingling, no weakness, no double vision, no gait abnormality, no slurred speech, no confusion  All systems reviewed and apart from [place] all are negative  Past Medical History:   Past Medical History:  Diagnosis Date  . Acute respiratory failure due to COVID-19 ([place]) 10/30/2019  . CKD (chronic kidney disease), stage III   . HTN (hypertension)   . Pulmonary HTN  ([place])   . Sleep apnea    on CPAP       Past Surgical History:  Procedure Laterality Date  . CESAREAN SECTION    . lumbar surgery      Social History:  Ambulatory   Independently      reports that she has quit smoking. She has never used smokeless tobacco. She reports previous alcohol use. She reports previous drug use.   Family History:   Family History  Problem Relation Age of Onset  . COPD Father   . Hypertension Brother   . Pulmonary Hypertension Neg Hx     Allergies: Allergies  Allergen Reactions  . Adhesive [Tape] Hives    Paper tape  . Succinylcholine Other (See Comments)    Have trouble waking up  . Tizanidine Hives     Prior to Admission medications   Medication Sig Start Date End Date Taking? Authorizing Provider  albuterol (VENTOLIN HFA) 108 (90 Base) MCG/ACT inhaler Inhale 2 puffs into the lungs every 6 (six) hours as needed for wheezing or shortness of breath. 11/03/19  Yes Thurnell Lose, MD  allopurinol (ZYLOPRIM) 100 MG tablet Take 100 mg by mouth daily.   Yes [provider]  ALYQ 20 MG tablet Take 20 mg by mouth daily. 09/24/19  Yes [provider]  Cholecalciferol (VITAMIN D-3) 125 MCG (5000 UT) TABS Take 5,000 mg by mouth daily.   Yes [provider]  furosemide (LASIX) 40 MG tablet Take 40 mg by mouth 2 (two) times daily. 10/07/19  Yes [provider]  hydrALAZINE (APRESOLINE) 50 MG tablet Take 1 tablet (50  mg total) by mouth every 8 (eight) hours. 11/03/19  Yes Thurnell Lose, MD  metolazone (ZAROXOLYN) 2.5 MG tablet Take 1 tablet (2.5 mg total) by mouth daily. 12/16/19  Yes Margaretha Seeds, MD  NIFEdipine (PROCARDIA XL/NIFEDICAL XL) 60 MG 24 hr tablet Take 60 mg by mouth daily.   Yes [provider]  OPSUMIT 10 MG tablet Take 10 mg by mouth daily. 09/22/19  Yes [provider]  potassium chloride 20 MEQ TBCR -Take 20 mEQ twice a day. -Take an additional 54 mEQ with metolazone Patient  taking differently: Take 1 tablet by mouth in the morning and at bedtime. -Take 20 mEQ twice a day. -Take an additional 62 mEQ with metolazone 12/16/19  Yes Margaretha Seeds, MD  PREMARIN 0.625 MG tablet Take 0.625 mg by mouth daily. 09/01/19  Yes [provider]  sildenafil (REVATIO) 20 MG tablet Take 20 mg by mouth daily.   Yes [provider]  telmisartan (MICARDIS) 40 MG tablet Take 20 mg by mouth daily. 10/07/19  Yes [provider]  torsemide (DEMADEX) 20 MG tablet Take 2 tablets (40 mg total) by mouth 2 (two) times daily for 5 days. 12/16/19 12/21/19 Yes Margaretha Seeds, MD   Physical Exam: Blood pressure (!) 138/112, pulse (!) 102, temperature 97.6 F (36.4 C), temperature source Oral, resp. rate (!) 24, height 5\' 1"  (1.549 m), weight 125.6 kg, SpO2 (!) 88 %. 1. General:  in No  Acute distress   Chronically ill  -appearing 2. Psychological: Alert and  Oriented 3. Head/ENT:   Moist  Mucous Membranes                          Head Non traumatic, neck supple                         Poor Dentition 4. SKIN:   decreased Skin turgor,  Skin clean Dry and intact no rash 5. Heart: Regular rate and rhythm no  Murmur, no Rub or gallop 6. Lungs: distant , no wheezes or crackles   7. Abdomen: Soft,  non-tender, Non distended obese  bowel sounds present 8. Lower extremities: no clubbing, cyanosis, trace edema 9. Neurologically Grossly intact, moving all 4 extremities equally   10. MSK: Normal range of motion   All other LABS:     Recent Labs  Lab 12/17/19 1655  WBC 5.6  NEUTROABS 3.5  HGB 13.8  HCT 45.3  MCV 101.6*  PLT 136*     Recent Labs  Lab 12/17/19 1655  NA 138  K 4.6  CL 109  CO2 17*  GLUCOSE 94  BUN 37*  CREATININE 2.11*  CALCIUM 9.0     No results for input(s): AST, ALT, ALKPHOS, BILITOT, PROT, ALBUMIN in the last 168 hours.     Cultures:    Component Value Date/Time   SDES BLOOD LEFT FOREARM 10/29/2019 2106   SPECREQUEST   10/29/2019 2106    BOTTLES DRAWN AEROBIC AND ANAEROBIC Blood Culture adequate volume   CULT  10/29/2019 2106    NO GROWTH 5 DAYS Performed at [facility], [place] [address], [place],  [address]    REPTSTATUS 11/03/2019 FINAL 10/29/2019 2106     Radiological Exams on Admission: DG Chest 2 View  Result Date: 12/17/2019 CLINICAL DATA:  Shortness of breath.  Fluid retention. EXAM: CHEST - 2 VIEW COMPARISON:  Single-view of the chest 10/29/2019.  FINDINGS: There is cardiomegaly without edema. No consolidative process, pneumothorax or effusion. No acute or focal bony abnormality. IMPRESSION: Cardiomegaly without acute disease. Electronically Signed   By: Inge Rise M.D.   On: 12/17/2019 14:31    Chart has been reviewed    Assessment/Plan  54 y.o. female with medical history significant of OSA, secondary pulmonary hypertension,at baseline on 8L of O2 restrictive lung disease and chronic hypoxemic respiratory failure   recently hospitalized for COVID-19 pneumonia,   Admitted for AKI in the setting of fluid overload and acute on chronic respiratory failure with hypoxia  Present on Admission: . Acute on chronic respiratory failure with hypoxia (HCC) -in the setting of pulmonary hypertension and possibly fluid overload.  As per cardiology will order echogram and diuresis monitor kidney function Check d.dimer   . Pulmonary hypertension ([place]) -continue home medications patient will need to follow-up with pulmonology  . CKD (chronic kidney disease) stage 3, GFR 30-59 ml/min-avoid nephrotoxic medications currently creatinine somewhat above baseline we will continue to monitor moderate  . OSA (obstructive sleep apnea) history of noncompliance with CPAP.  Will need to readdress  . Obesity would benefit from outpatient follow-up and possible referral to bariatric services  Recent Covid infection -currently out of window of infectious disease.  Covid negative.  Hold off on  precautions.  Other plan as per orders.  DVT prophylaxis:  Lovenox     Code Status:  FULL CODE  as per patient  I had personally discussed CODE STATUS with patient     Family Communication:   Family not at  Bedside   Disposition Plan:     To home once workup is complete and patient is stable                      Would benefit from PT/OT eval prior to DC  Ordered                                      Consults called:  Cardiology is aware,   Admission status:  ED Disposition    ED Disposition Condition [place]: [place] [[id number]]  Level of Care: Progressive [102]  Admit to Progressive based on following criteria: RESPIRATORY PROBLEMS hypoxemic/hypercapnic respiratory failure that is responsive to NIPPV (BiPAP) or High Flow Nasal Cannula (6-80 lpm). Frequent assessment/intervention, no > Q2 hrs < Q4 hrs, to maintain oxygenation and pulmonary hygiene.  May admit patient to [facility] or [facility] if equivalent level of care is available:: No  Covid Evaluation: Confirmed COVID Negative  Date Laboratory Confirmed COVID Negative: 12/18/2019  Diagnosis: CHF exacerbation [facility]) [[id number]]  Admitting Physician: Toy Baker [[id number]]  Attending Physician: Toy Baker [[id number]]  Estimated length of stay: 3 - 4 days  Certification:: I certify this patient will need inpatient services for at least 2 midnights         inpatient     Expect 2 midnight stay secondary to severity of patient's current illness including   hemodynamic instability despite optimal treatment ( hypoxia )  Severe lab/radiological/exam abnormalities including:    AKI and extensive comorbidities including:  Morbid Obesity  CKD   That are currently affecting medical management.   I expect  patient to be hospitalized for 2 midnights requiring inpatient medical care.  Patient is at high risk for adverse outcome (such as loss of  life or disability) if not  treated.  Indication for inpatient stay as follows:    Hemodynamic instability despite maximal medical therapy,    New or worsening hypoxia  Need for Iv diuretics     Level of care          SDU tele indefinitely please discontinue once patient no longer qualifies   Precautions: admitted as   Covid Negative  No active isolations    PPE: Used by the provider:   P100  eye Goggles,  Gloves     Jona Erkkila 12/18/2019, 1:31 AM    [facility]     after 2 AM please page floor coverage PA If 7AM-7PM, please contact the day team taking care of the patient using [email or web address]   Patient was evaluated in the context of the global COVID-19 pandemic, which necessitated consideration that the patient might be at risk for infection with the SARS-CoV-2 virus that causes COVID-19. Institutional protocols and algorithms that pertain to the evaluation of patients at risk for COVID-19 are in a state of rapid change based on information released by regulatory bodies including the CDC and federal and state organizations. These policies and algorithms were followed during the patient's care.

## 2019-12-18 ENCOUNTER — Inpatient Hospital Stay (HOSPITAL_COMMUNITY): Payer: Medicare Other

## 2019-12-18 ENCOUNTER — Other Ambulatory Visit (HOSPITAL_COMMUNITY): Payer: Medicare Other

## 2019-12-18 ENCOUNTER — Encounter (HOSPITAL_COMMUNITY): Payer: Medicare Other

## 2019-12-18 ENCOUNTER — Encounter (HOSPITAL_COMMUNITY)
Admission: EM | Disposition: A | Discharge status: Home / Self Care | Payer: Self-pay | Source: Home / Self Care | Attending: Internal Medicine

## 2019-12-18 DIAGNOSIS — R609 Edema, unspecified: Secondary | ICD-10-CM

## 2019-12-18 DIAGNOSIS — J9601 Acute respiratory failure with hypoxia: Secondary | ICD-10-CM

## 2019-12-18 DIAGNOSIS — R0609 Other forms of dyspnea: Secondary | ICD-10-CM

## 2019-12-18 DIAGNOSIS — I50810 Right heart failure, unspecified: Secondary | ICD-10-CM

## 2019-12-18 DIAGNOSIS — I272 Pulmonary hypertension, unspecified: Secondary | ICD-10-CM

## 2019-12-18 DIAGNOSIS — I371 Nonrheumatic pulmonary valve insufficiency: Secondary | ICD-10-CM

## 2019-12-18 DIAGNOSIS — I361 Nonrheumatic tricuspid (valve) insufficiency: Secondary | ICD-10-CM

## 2019-12-18 HISTORY — PX: RIGHT HEART CATH: CATH118263

## 2019-12-18 LAB — URINALYSIS, ROUTINE W REFLEX MICROSCOPIC
Bilirubin Urine: NEGATIVE
Glucose, UA: NEGATIVE mg/dL
Ketones, ur: NEGATIVE mg/dL
Leukocytes,Ua: NEGATIVE
Nitrite: NEGATIVE
Protein, ur: 100 mg/dL — AB
RBC / HPF: >50 RBC/hpf — ABNORMAL HIGH (ref 0–5)
Specific Gravity, Urine: 1.006 (ref 1.005–1.030)
pH: 5 (ref 5.0–8.0)

## 2019-12-18 LAB — POCT I-STAT EG7
Acid-base deficit: 2 mmol/L (ref 0.0–2.0)
Acid-base deficit: 3 mmol/L — ABNORMAL HIGH (ref 0.0–2.0)
Bicarbonate: 23.8 mmol/L (ref 20.0–28.0)
Bicarbonate: 24.8 mmol/L (ref 20.0–28.0)
Calcium, Ion: 1.19 mmol/L (ref 1.15–1.40)
Calcium, Ion: 1.27 mmol/L (ref 1.15–1.40)
HCT: 44 % (ref 36.0–46.0)
HCT: 46 % (ref 36.0–46.0)
Hemoglobin: 15 g/dL (ref 12.0–15.0)
Hemoglobin: 15.6 g/dL — ABNORMAL HIGH (ref 12.0–15.0)
O2 Saturation: 61 %
O2 Saturation: 63 %
Potassium: 3.6 mmol/L (ref 3.5–5.1)
Potassium: 3.6 mmol/L (ref 3.5–5.1)
Sodium: 139 mmol/L (ref 135–145)
Sodium: 141 mmol/L (ref 135–145)
TCO2: 25 mmol/L (ref 22–32)
TCO2: 26 mmol/L (ref 22–32)
pCO2, Ven: 45.4 mmHg (ref 44.0–60.0)
pCO2, Ven: 48 mmHg (ref 44.0–60.0)
pH, Ven: 7.322 (ref 7.250–7.430)
pH, Ven: 7.327 (ref 7.250–7.430)
pO2, Ven: 34 mmHg (ref 32.0–45.0)
pO2, Ven: 36 mmHg (ref 32.0–45.0)

## 2019-12-18 LAB — CBC
HCT: 45.5 % (ref 36.0–46.0)
Hemoglobin: 14.2 g/dL (ref 12.0–15.0)
MCH: 30.9 pg (ref 26.0–34.0)
MCHC: 31.2 g/dL (ref 30.0–36.0)
MCV: 98.9 fL (ref 80.0–100.0)
Platelets: 249 10*3/uL (ref 150–400)
RBC: 4.6 MIL/uL (ref 3.87–5.11)
RDW: 15.8 % — ABNORMAL HIGH (ref 11.5–15.5)
WBC: 5.8 10*3/uL (ref 4.0–10.5)
nRBC: 0 % (ref 0.0–0.2)

## 2019-12-18 LAB — COMPREHENSIVE METABOLIC PANEL
ALT: 54 U/L — ABNORMAL HIGH (ref 0–44)
AST: 33 U/L (ref 15–41)
Albumin: 3 g/dL — ABNORMAL LOW (ref 3.5–5.0)
Alkaline Phosphatase: 77 U/L (ref 38–126)
Anion gap: 9 (ref 5–15)
BUN: 38 mg/dL — ABNORMAL HIGH (ref 6–20)
CO2: 20 mmol/L — ABNORMAL LOW (ref 22–32)
Calcium: 9 mg/dL (ref 8.9–10.3)
Chloride: 108 mmol/L (ref 98–111)
Creatinine, Ser: 2.18 mg/dL — ABNORMAL HIGH (ref 0.44–1.00)
GFR calc Af Amer: 29 mL/min — ABNORMAL LOW (ref >60)
GFR calc non Af Amer: 25 mL/min — ABNORMAL LOW (ref >60)
Glucose, Bld: 86 mg/dL (ref 70–99)
Potassium: 3.9 mmol/L (ref 3.5–5.1)
Sodium: 137 mmol/L (ref 135–145)
Total Bilirubin: 0.6 mg/dL (ref 0.3–1.2)
Total Protein: 5.3 g/dL — ABNORMAL LOW (ref 6.5–8.1)

## 2019-12-18 LAB — BASIC METABOLIC PANEL
Anion gap: 9 (ref 5–15)
BUN: 38 mg/dL — ABNORMAL HIGH (ref 6–20)
CO2: 25 mmol/L (ref 22–32)
Calcium: 9.4 mg/dL (ref 8.9–10.3)
Chloride: 105 mmol/L (ref 98–111)
Creatinine, Ser: 2.33 mg/dL — ABNORMAL HIGH (ref 0.44–1.00)
GFR calc Af Amer: 27 mL/min — ABNORMAL LOW (ref >60)
GFR calc non Af Amer: 23 mL/min — ABNORMAL LOW (ref >60)
Glucose, Bld: 117 mg/dL — ABNORMAL HIGH (ref 70–99)
Potassium: 4.5 mmol/L (ref 3.5–5.1)
Sodium: 139 mmol/L (ref 135–145)

## 2019-12-18 LAB — ECHOCARDIOGRAM COMPLETE
Height: 61 in
Weight: 4334.4 oz

## 2019-12-18 LAB — MAGNESIUM
Magnesium: 2.1 mg/dL (ref 1.7–2.4)
Magnesium: 1.8 mg/dL (ref 1.7–2.4)

## 2019-12-18 LAB — TSH: TSH: 5.125 u[IU]/mL — ABNORMAL HIGH (ref 0.350–4.500)

## 2019-12-18 LAB — PHOSPHORUS: Phosphorus: 4.6 mg/dL (ref 2.5–4.6)

## 2019-12-18 LAB — D-DIMER, QUANTITATIVE: D-Dimer, Quant: 0.91 ug/mL-FEU — ABNORMAL HIGH (ref 0.00–0.50)

## 2019-12-18 SURGERY — RIGHT HEART CATH
Anesthesia: LOCAL

## 2019-12-18 MED ORDER — NIFEDIPINE ER OSMOTIC RELEASE 60 MG PO TB24
60.0000 mg | ORAL_TABLET | Freq: Every day | ORAL | Status: DC
  Administered 2019-12-18 – 2019-12-20 (×3): 60 mg via ORAL
  Filled 2019-12-18 (×3): qty 1

## 2019-12-18 MED ORDER — HYDRALAZINE HCL 50 MG PO TABS
75.0000 mg | ORAL_TABLET | Freq: Three times a day (TID) | ORAL | Status: DC
  Administered 2019-12-18 – 2019-12-21 (×9): 75 mg via ORAL
  Filled 2019-12-18 (×9): qty 1

## 2019-12-18 MED ORDER — SODIUM CHLORIDE 0.9% FLUSH
3.0000 mL | Freq: Two times a day (BID) | INTRAVENOUS | Status: DC
  Administered 2019-12-18 – 2019-12-23 (×10): 3 mL via INTRAVENOUS

## 2019-12-18 MED ORDER — ACETAMINOPHEN 650 MG RE SUPP: 650.0000 mg | Freq: Four times a day (QID) | RECTAL | Status: DC | PRN

## 2019-12-18 MED ORDER — FENTANYL CITRATE (PF) 100 MCG/2ML IJ SOLN
INTRAMUSCULAR | Status: DC | PRN
  Administered 2019-12-18: 25 ug via INTRAVENOUS

## 2019-12-18 MED ORDER — FUROSEMIDE 10 MG/ML IJ SOLN
80.0000 mg | Freq: Two times a day (BID) | INTRAMUSCULAR | Status: DC
  Administered 2019-12-18 – 2019-12-22 (×8): 80 mg via INTRAVENOUS
  Filled 2019-12-18 (×9): qty 8

## 2019-12-18 MED ORDER — ASPIRIN 81 MG PO CHEW: 81.0000 mg | CHEWABLE_TABLET | ORAL | Status: DC

## 2019-12-18 MED ORDER — SODIUM CHLORIDE 0.9 % IV SOLN: INTRAVENOUS | Status: DC

## 2019-12-18 MED ORDER — ACETAMINOPHEN 325 MG PO TABS
650.0000 mg | ORAL_TABLET | Freq: Four times a day (QID) | ORAL | Status: DC | PRN
  Administered 2019-12-18 – 2019-12-22 (×5): 650 mg via ORAL
  Filled 2019-12-18 (×5): qty 2

## 2019-12-18 MED ORDER — POTASSIUM CHLORIDE CRYS ER 20 MEQ PO TBCR
20.0000 meq | EXTENDED_RELEASE_TABLET | Freq: Once | ORAL | Status: AC
  Administered 2019-12-18: 20 meq via ORAL
  Filled 2019-12-18: qty 1

## 2019-12-18 MED ORDER — ENOXAPARIN SODIUM 40 MG/0.4ML ~~LOC~~ SOLN
40.0000 mg | SUBCUTANEOUS | Status: DC
  Administered 2019-12-18 – 2019-12-22 (×4): 40 mg via SUBCUTANEOUS
  Filled 2019-12-18 (×5): qty 0.4

## 2019-12-18 MED ORDER — SODIUM CHLORIDE 0.9 % IV SOLN: 250.0000 mL | INTRAVENOUS | Status: DC | PRN

## 2019-12-18 MED ORDER — HYDRALAZINE HCL 50 MG PO TABS
50.0000 mg | ORAL_TABLET | Freq: Three times a day (TID) | ORAL | Status: DC
  Administered 2019-12-18: 50 mg via ORAL
  Filled 2019-12-18: qty 2

## 2019-12-18 MED ORDER — ONDANSETRON HCL 4 MG/2ML IJ SOLN: 4.0000 mg | Freq: Four times a day (QID) | INTRAMUSCULAR | Status: DC | PRN

## 2019-12-18 MED ORDER — LIDOCAINE HCL (PF) 1 % IJ SOLN
INTRAMUSCULAR | Status: DC | PRN
  Administered 2019-12-18 (×2): 2 mL via INTRADERMAL

## 2019-12-18 MED ORDER — SODIUM CHLORIDE 0.9% FLUSH: 3.0000 mL | INTRAVENOUS | Status: DC | PRN

## 2019-12-18 MED ORDER — HEPARIN (PORCINE) IN NACL 1000-0.9 UT/500ML-% IV SOLN
INTRAVENOUS | Status: DC | PRN
  Administered 2019-12-18: 500 mL

## 2019-12-18 MED ORDER — HYDRALAZINE HCL 20 MG/ML IJ SOLN
INTRAMUSCULAR | Status: AC
  Filled 2019-12-18: qty 1

## 2019-12-18 MED ORDER — ASPIRIN 81 MG PO CHEW
81.0000 mg | CHEWABLE_TABLET | ORAL | Status: AC
  Administered 2019-12-18: 81 mg via ORAL
  Filled 2019-12-18: qty 1

## 2019-12-18 MED ORDER — LIDOCAINE HCL (PF) 1 % IJ SOLN
INTRAMUSCULAR | Status: AC
  Filled 2019-12-18: qty 30

## 2019-12-18 MED ORDER — HEPARIN (PORCINE) IN NACL 1000-0.9 UT/500ML-% IV SOLN
INTRAVENOUS | Status: AC
  Filled 2019-12-18: qty 1000

## 2019-12-18 MED ORDER — FENTANYL CITRATE (PF) 100 MCG/2ML IJ SOLN
INTRAMUSCULAR | Status: AC
  Filled 2019-12-18: qty 2

## 2019-12-18 MED ORDER — ONDANSETRON HCL 4 MG PO TABS: 4.0000 mg | ORAL_TABLET | Freq: Four times a day (QID) | ORAL | Status: DC | PRN

## 2019-12-18 MED ORDER — MAGNESIUM SULFATE 2 GM/50ML IV SOLN
2.0000 g | Freq: Once | INTRAVENOUS | Status: AC
  Administered 2019-12-18: 2 g via INTRAVENOUS
  Filled 2019-12-18: qty 50

## 2019-12-18 MED ORDER — HYDROCODONE-ACETAMINOPHEN 5-325 MG PO TABS: 1.0000 | ORAL_TABLET | ORAL | Status: DC | PRN

## 2019-12-18 MED ORDER — HYDRALAZINE HCL 20 MG/ML IJ SOLN
INTRAMUSCULAR | Status: DC | PRN
  Administered 2019-12-18 (×2): 10 mg via INTRAVENOUS

## 2019-12-18 MED ORDER — SODIUM CHLORIDE 0.9% FLUSH
3.0000 mL | Freq: Two times a day (BID) | INTRAVENOUS | Status: DC
  Administered 2019-12-20 – 2019-12-22 (×5): 3 mL via INTRAVENOUS

## 2019-12-18 MED ORDER — FUROSEMIDE 10 MG/ML IJ SOLN
60.0000 mg | Freq: Two times a day (BID) | INTRAMUSCULAR | Status: DC
  Administered 2019-12-18: 60 mg via INTRAVENOUS
  Filled 2019-12-18: qty 6

## 2019-12-18 SURGICAL SUPPLY — 9 items
CATH BALLN WEDGE 5F 110CM (CATHETERS) ×1 IMPLANT
GUIDEWIRE .025 260CM (WIRE) ×1 IMPLANT
PACK CARDIAC CATHETERIZATION (CUSTOM PROCEDURE TRAY) ×2 IMPLANT
PROTECTION STATION PRESSURIZED (MISCELLANEOUS) ×2
SHEATH GLIDE SLENDER 4/5FR (SHEATH) ×1 IMPLANT
STATION PROTECTION PRESSURIZED (MISCELLANEOUS) IMPLANT
TRANSDUCER W/STOPCOCK (MISCELLANEOUS) ×2 IMPLANT
TUBING ART PRESS 72  MALE/FEM (TUBING) ×1
TUBING ART PRESS 72 MALE/FEM (TUBING) IMPLANT

## 2019-12-18 NOTE — Interval H&P Note (Signed)
History and Physical Interval Note:  12/18/2019 12:07 PM  Dawn Fowler  has presented today for surgery, with the diagnosis of pulmonary hypertension.  The various methods of treatment have been discussed with the patient and family. After consideration of risks, benefits and other options for treatment, the patient has consented to  Procedure(s): RIGHT HEART CATH (N/A) as a surgical intervention.  The patient's history has been reviewed, patient examined, no change in status, stable for surgery.  I have reviewed the patient's chart and labs.  Questions were answered to the patient's satisfaction.     Elizandro Laura [employer]

## 2019-12-18 NOTE — ED Notes (Signed)
Admitting paged to RN per his request  

## 2019-12-18 NOTE — H&P (View-Only) (Signed)
Advanced Heart Failure Team Consult Note   Primary Physician: Hayden Rasmussen, MD PCP-Cardiologist:  No primary care provider on file.  AHFC: New   Reason for Consultation: Pulmonary hypertension  HPI:    Dawn Fowler is seen today for evaluation of pulmonary hypertension at the request of Dr. Reesa Chew, internal medicine.  54 year old female with a history of chronic hypoxic respiratory failure secondary to restrictive lung disease on chronic home O2 (8 L/min baseline), pulmonary hypertension, obstructive sleep apnea on CPAP, stage III chronic kidney disease, obesity, prior h/o PE, and recent hospitalization for COVID-19 pneumonia 09/2019.  According to records, she had a very mild case of Covid and responded very well to IV steroids and remdesivir.  She did not require intubation.  She was discharged home on November 03, 2019.    Also of note, she was previously followed by [facility] health system and underwent extensive work-up for pulmonary hypertension in 2016.  Echocardiogram at that time showed moderate pulmonary hypertension, RVSP 40 to 50 mmHg.  Left ventricular EF normal 60 to 65%, moderate diastolic dysfunction.  No significant valvular disease.  PFTs demonstrated FEV1/FVC 83%.  VQ scan very low probability for PE. 6-minute walk test showed impaired exercise tolerance.  Following discharge from [facility], she had post hospital follow-up at [facility] pulmonology clinic 1/21 and was seen by Dr. Loanne Drilling, and was fairly stable at that time from a respiratory standpoint.  However, over the last several weeks she has developed worsening dyspnea along with progressive weight gain of 10+ pounds leading to increase in home diuretics.  Lasix was recently increased and metolazone was added without improvement. Yesterday, she developed symptoms w/ minimal activity prompting her to seek emergency evaluation.  She was brought in to the [facility] ED by EMS on 2/17.  ED course: Hypoxic on  arrival, SPO2 88%.  Tachypneic RR 24 tachycardic pulse rate 102.  Afebrile.  Hypertensive 138/112.  Pertinent labs: Covid negative, BNP 958.  Sensitivity troponin, 22>> 21.  WBC 5.6.  Hemoglobin 13.8.  Basic metabolic panel consistent with acute kidney injury.  Serum creatinine elevated at 2.11 (baseline 1.5).  K 4.6.  She was admitted by internal medicine and started on IV Lasix 60 mg twice daily.  Good urinary response thus far.  -3.2 L out overnight.  Serum creatinine unchanged at 2.18.  2D echo pending.    Previous Studies ([facility]) 2D echo 12/21/2014  Interpretation Summary -- Normal biventricular systolic function; LVEF 19-50%. -- Moderate pulmonary hypertension. RVSP 40-50 mmHg. RV normal -- Mild diastolic dysfunction. -- No significant valvular disease.  VQ scan 12/25/2014 IMPRESSION: Mild obstructive airway disease. Very low probability for pulmonary embolus.  6-minute walk test 12/24/2014 Interpretation   Simple pulmonary stress test (6 minute walk test) today shows impaired exercise tolerance with 115.2 meters walked in 6 minutes.  Spirometry 02/11/2015 Component Name Value Ref Range  FEV1 1.78 liters  FVC 2.14 liters  FEV1/FVC 83 %  TLC  liters  DLCO  ml/mmHg sec  PEAK FLOW  20 - 800 L/MIN    Review of Systems: [y] = yes, [ ]  = no   . General: Weight gain [ ] ; Weight loss [ ] ; Anorexia [ ] ; Fatigue [ ] ; Fever [ ] ; Chills [ ] ; Weakness [ ]   . Cardiac: Chest pain/pressure [ ] ; Resting SOB [ ] ; Exertional SOB [ ] ; Orthopnea [ ] ; Pedal Edema [ ] ; Palpitations [ ] ; Syncope [ ] ; Presyncope [ ] ; Paroxysmal nocturnal dyspnea[ ]   .  Pulmonary: Cough [ ] ; Wheezing[ ] ; Hemoptysis[ ] ; Sputum [ ] ; Snoring [ ]   . GI: Vomiting[ ] ; Dysphagia[ ] ; Melena[ ] ; Hematochezia [ ] ; Heartburn[ ] ; Abdominal pain [ ] ; Constipation [ ] ; Diarrhea [ ] ; BRBPR [ ]   . GU: Hematuria[ ] ; Dysuria [ ] ; Nocturia[ ]   . Vascular: Pain in legs with walking [ ] ; Pain in feet with lying flat [ ] ; Non-healing  sores [ ] ; Stroke [ ] ; TIA [ ] ; Slurred speech [ ] ;  . Neuro: Headaches[ ] ; Vertigo[ ] ; Seizures[ ] ; Paresthesias[ ] ;Blurred vision [ ] ; Diplopia [ ] ; Vision changes [ ]   . Ortho/Skin: Arthritis [ ] ; Joint pain [ ] ; Muscle pain [ ] ; Joint swelling [ ] ; Back Pain [ ] ; Rash [ ]   . Psych: Depression[ ] ; Anxiety[ ]   . Heme: Bleeding problems [ ] ; Clotting disorders [ ] ; Anemia [ ]   . Endocrine: Diabetes [ ] ; Thyroid dysfunction[ ]   Home Medications Prior to Admission medications   Medication Sig Start Date End Date Taking? Authorizing Provider  albuterol (VENTOLIN HFA) 108 (90 Base) MCG/ACT inhaler Inhale 2 puffs into the lungs every 6 (six) hours as needed for wheezing or shortness of breath. 11/03/19  Yes Thurnell Lose, MD  allopurinol (ZYLOPRIM) 100 MG tablet Take 100 mg by mouth daily.   Yes [provider]  ALYQ 20 MG tablet Take 20 mg by mouth daily. 09/24/19  Yes [provider]  Cholecalciferol (VITAMIN D-3) 125 MCG (5000 UT) TABS Take 5,000 mg by mouth daily.   Yes [provider]  furosemide (LASIX) 40 MG tablet Take 40 mg by mouth 2 (two) times daily. 10/07/19  Yes [provider]  hydrALAZINE (APRESOLINE) 50 MG tablet Take 1 tablet (50 mg total) by mouth every 8 (eight) hours. 11/03/19  Yes Thurnell Lose, MD  metolazone (ZAROXOLYN) 2.5 MG tablet Take 1 tablet (2.5 mg total) by mouth daily. 12/16/19  Yes Margaretha Seeds, MD  NIFEdipine (PROCARDIA XL/NIFEDICAL XL) 60 MG 24 hr tablet Take 60 mg by mouth daily.   Yes [provider]  OPSUMIT 10 MG tablet Take 10 mg by mouth daily. 09/22/19  Yes [provider]  potassium chloride 20 MEQ TBCR -Take 20 mEQ twice a day. -Take an additional 56 mEQ with metolazone Patient taking differently: Take 1 tablet by mouth in the morning and at bedtime. -Take 20 mEQ twice a day. -Take an additional 45 mEQ with metolazone 12/16/19  Yes Margaretha Seeds, MD  PREMARIN 0.625 MG tablet Take 0.625 mg  by mouth daily. 09/01/19  Yes [provider]  sildenafil (REVATIO) 20 MG tablet Take 20 mg by mouth daily.   Yes [provider]  telmisartan (MICARDIS) 40 MG tablet Take 20 mg by mouth daily. 10/07/19  Yes [provider]  torsemide (DEMADEX) 20 MG tablet Take 2 tablets (40 mg total) by mouth 2 (two) times daily for 5 days. 12/16/19 12/21/19 Yes Margaretha Seeds, MD    Past Medical History: Past Medical History:  Diagnosis Date  . Acute respiratory failure due to COVID-19 ([facility]) 10/30/2019  . CKD (chronic kidney disease), stage III   . HTN (hypertension)   . Pulmonary HTN ([place])   . Sleep apnea    on CPAP    Past Surgical History: Past Surgical History:  Procedure Laterality Date  . CESAREAN SECTION    . lumbar surgery      Family History: Family History  Problem Relation Age of Onset  .  COPD Father   . Hypertension Brother   . Pulmonary Hypertension Neg Hx     Social History: Social History   Socioeconomic History  . Marital status: Single    Spouse name: Not on file  . Number of children: Not on file  . Years of education: Not on file  . Highest education level: Not on file  Occupational History  . Not on file  Tobacco Use  . Smoking status: Former Research scientist (life sciences)  . Smokeless tobacco: Never Used  . Tobacco comment: only smoked for fun in highschool not even one a day  Substance and Sexual Activity  . Alcohol use: Not Currently  . Drug use: Not Currently  . Sexual activity: Not on file  Other Topics Concern  . Not on file  Social History Narrative  . Not on file   Social Determinants of Health   Financial Resource Strain:   . Difficulty of Paying Living Expenses: Not on file  Food Insecurity:   . Worried About Charity fundraiser in the Last Year: Not on file  . Ran Out of Food in the Last Year: Not on file  Transportation Needs:   . Lack of Transportation (Medical): Not on file  . Lack of Transportation (Non-Medical): Not on file    Physical Activity:   . Days of Exercise per Week: Not on file  . Minutes of Exercise per Session: Not on file  Stress:   . Feeling of Stress : Not on file  Social Connections:   . Frequency of Communication with Friends and Family: Not on file  . Frequency of Social Gatherings with Friends and Family: Not on file  . Attends Religious Services: Not on file  . Active Member of Clubs or Organizations: Not on file  . Attends Archivist Meetings: Not on file  . Marital Status: Not on file    Allergies:  Allergies  Allergen Reactions  . Adhesive [Tape] Hives    Paper tape  . Succinylcholine Other (See Comments)    Have trouble waking up  . Tizanidine Hives    Objective:    Vital Signs:   Temp:  [97.6 F (36.4 C)] 97.6 F (36.4 C) (02/17 1350) Pulse Rate:  [60-102] 80 (02/18 0336) Resp:  [16-24] 18 (02/18 0336) BP: (138-179)/(103-123) 159/115 (02/18 0336) SpO2:  [88 %-100 %] 100 % (02/18 0336) Weight:  [122.9 kg-125.6 kg] 122.9 kg (02/18 0336) Last BM Date: 12/17/19  Weight change: Filed Weights   12/17/19 1402 12/18/19 0336  Weight: 125.6 kg 122.9 kg    Intake/Output:   Intake/Output Summary (Last 24 hours) at 12/18/2019 0734 Last data filed at 12/18/2019 0204 Gross per 24 hour  Intake --  Output 3200 ml  Net -3200 ml      Physical Exam    General:  Well appearing. No resp difficulty HEENT: normal Neck: supple. JVP . Carotids 2+ bilat; no bruits. No lymphadenopathy or thyromegaly appreciated. Cor: PMI nondisplaced. Regular rate & rhythm. No rubs, gallops or murmurs. Lungs: clear Abdomen: soft, nontender, nondistended. No hepatosplenomegaly. No bruits or masses. Good bowel sounds. Extremities: no cyanosis, clubbing, rash, edema Neuro: alert & orientedx3, cranial nerves grossly intact. moves all 4 extremities w/o difficulty. Affect pleasant   EKG    Normal sinus rhythm with PACs, 96 bpm, right axis deviation, low voltage.  Labs   Basic  Metabolic Panel: Recent Labs  Lab 12/17/19 1655 12/18/19 0319  NA 138 137  K 4.6 3.9  CL 109  108  CO2 17* 20*  GLUCOSE 94 86  BUN 37* 38*  CREATININE 2.11* 2.18*  CALCIUM 9.0 9.0  MG  --  2.1  PHOS  --  4.6    Liver Function Tests: Recent Labs  Lab 12/18/19 0319  AST 33  ALT 54*  ALKPHOS 77  BILITOT 0.6  PROT 5.3*  ALBUMIN 3.0*   No results for input(s): LIPASE, AMYLASE in the last 168 hours. No results for input(s): AMMONIA in the last 168 hours.  CBC: Recent Labs  Lab 12/17/19 1655 12/18/19 0319  WBC 5.6 5.8  NEUTROABS 3.5  --   HGB 13.8 14.2  HCT 45.3 45.5  MCV 101.6* 98.9  PLT 136* 249    Cardiac Enzymes: No results for input(s): CKTOTAL, CKMB, CKMBINDEX, TROPONINI in the last 168 hours.  BNP: BNP (last 3 results) Recent Labs    11/02/19 0020 11/03/19 0445 12/17/19 1655  BNP 29.8 25.1 958.5*    ProBNP (last 3 results) No results for input(s): PROBNP in the last 8760 hours.   CBG: No results for input(s): GLUCAP in the last 168 hours.  Coagulation Studies: No results for input(s): LABPROT, INR in the last 72 hours.   Imaging   DG Chest 2 View  Result Date: 12/17/2019 CLINICAL DATA:  Shortness of breath.  Fluid retention. EXAM: CHEST - 2 VIEW COMPARISON:  Single-view of the chest 10/29/2019. FINDINGS: There is cardiomegaly without edema. No consolidative process, pneumothorax or effusion. No acute or focal bony abnormality. IMPRESSION: Cardiomegaly without acute disease. Electronically Signed   By: Inge Rise M.D.   On: 12/17/2019 14:31      Medications:     Current Medications: . enoxaparin (LOVENOX) injection  40 mg Subcutaneous Q24H  . furosemide  60 mg Intravenous BID  . hydrALAZINE  50 mg Oral Q8H  . sodium chloride flush  3 mL Intravenous Q12H     Infusions: . sodium chloride         Assessment/Plan   1.  Pulmonary hypertension: Echo in 2016 showed moderate pulmonary hypertension, RVSP 40 to 50 mmHg.  Left  ventricular EF with diastolic dysfunction and no significant valvular disease.  She has known obstructive sleep apnea and chronic lung disease as well as prior history of PE.  Suspect primarily WHO group 3, and possible contributions of WHO groups 2 and 4. -Continue to diurese with IV Lasix -Update echocardiogram -Plan right heart cath this admit -Needs updated PFTs with DLCO once diuresed -Needs updated sleep study -Check V/Q scan (prior h/o PE) -continue supp daytime O2/ CPAP QHS.  -On sildenafil as outpatient  2. Acute on Chronic Diastolic CHF: Echo in 7371 showed normal EF 60 to 06% but diastolic dysfunction.  BNP elevated at 958.  Volume overloaded on exam. -Continue IV Lasix for diuresis -Update echo -Plan right heart cath -BP control  3.  Acute hypoxic respiratory failure: 2/2 Pulmonary hypertension and acute on chronic diastolic heart failure  4.  AKI on stage III CKD: Serum creatinine 2.1 on admit.  Baseline 1.5. -Monitor with diuresis  5.  Hypertension: BP elevated. -Home nifedipine and sildenafil not ordered on admit.  6. OSA:  - continue CPAP   7. Obesity: Body mass index is 51.19 kg/m. - needs wt loss   Length of Stay: 1  Brittainy Simmons, PA-C  12/18/2019, 7:34 AM  Advanced Heart Failure Team Pager [phone] (M-F; 7a - 4p)  Please contact [facility] for night-coverage after hours (4p -7a ) and weekends on  [email or web address]  Patient seen with PA, agree with the above note.   Patient has a long history of severe OSA, and suspect OHS/OSA.  She is on CPAP at home and 8L home oxygen (says she has been on oxygen for "years." She recently moved to [place].  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  She additionally has significant HTN.  She has been on Opsumit 10 + tadalafil 20 mg daily.  She ran out a couple of weeks  ago.  She does not feel that these medications helped her much. She has a history of prior DVT after long train ride, has had 1 or 2 negative V/Q scans.  She was hospitalized for COVID-19 in 1/21 and says that she has been more short of breath since that time.   She was seen in the pulmonary office here on 2/16 and was noted to be volume overloaded, Lasix was changed to torsemide but she has not started the torsemide. She reports a 20 lb weight gain over the last few weeks and increased dyspnea.  She came to the ER 2/17 due to dyspnea and was admitted.   General: NAD Neck: JVP 14-16 cm, no thyromegaly or thyroid nodule.  Lungs: Decreased at bases.  CV: Nondisplaced PMI.  Heart regular S1/S2, no S3/S4, no murmur. 1+ ankle edema L>R.  No carotid bruit.  Normal pedal pulses.  Abdomen: Soft, nontender, no hepatosplenomegaly, no distention.  Skin: Intact without lesions or rashes.  Neurologic: Alert and oriented x 3.  Psych: Normal affect. Extremities: No clubbing or cyanosis.  HEENT: Normal.   1. Acute on chronic diastolic CHF with suspected significant RV dysfunction: Echo done in 2016 showed normal LV EF, RV reportedly ok.  No recent echo available.  Last cath from 2019 showed mixed pulmonary venous/pulmonary arterial hypertension so suspect a mix of diastolic LV dysfunction and RV failure from pulmonary hypertension. On exam, she is significantly volume overloaded.  Creatinine up to 2.1 from baseline around 1.5 but stable with diuresis overnight.  - Continue Lasix at 80 mg IV bid.  Hopefully with lowering of renal venous pressure, creatinine will stabilize to improve.  - I will plan RHC today to assess filling pressures and PA pressure. We discussed risks/benefits and she agrees to procedure.  2. Pulmonary hypertension: I suspect that this is a mixed picture with group 2 and group 3 PH (related to diastolic LV dysfunction and OHS/OSA).  I am not sure that she gets much benefit from Opsumit and  tadalafil (currently out of them as well).  V/Q scan in 2019 was not suggestive of chronic PEs and CT chest in 2019 was not suggestive of ILD. HIV was negative.   - For now, will diurese aggressively to try to get filling pressures down.  - RHC today as above for full assessment.  - Once diuresed, would plan for full PFTs as well as repeat high resolution CT chest. May be worthwhile to repeat V/Q.  - Will send rheumatological serologies: scleroderma w/u, ANA, RF.  - I suspect that the key to treatment will be maintaining oxygen saturation with home O2 and CPAP.  Weight loss also would be helpful.  3. HTN: BP markedly elevated today, she is off her home meds.  - Restart nifedipine XR at 60 mg daily.  - Continue hydralazine 50 mg tid.  - Will hold  off on telmisartan for now with elevated creatinine.  4. AKI and CKD stage 3: Hold telmisartan.  As above, hopefully will see some improvement with diuresis.   [name] 12/18/2019 9:44 AM

## 2019-12-18 NOTE — Progress Notes (Addendum)
Late Entry for 12/18/19 @ 0225: Called to obtain report from Alroy Dust, staff RN [place]. Staff nurse expresses concern related to most recent blood pressure of 156/117, HR 70, oxygen saturation of 98% on 7 liters nasal canula. Staff nurses makes a request to notify on call MD for BP medication to treat elevated BP prior to transfer to floor.    New patient admission 12/18/19 @ 0336: Patient received from [facility] ED via stretcher. Staff nurse from ED in accompany. Admission vitals, height and weight obtained and entered into computer. Patient serves as historian. Reports that pharmacy of choice is [facility] ([place] location) and [facility] provider of home oxygen. Side rails up x 2. Bed in lowest position and locked. Call light in place. Explained to patient that visiting hours per [place] are one visitor from 10:00AM-8:00 PM. Verbalized understanding.   MD Communication 12/18/19@ 6381: On call provider notified regarding staff nurse's concern for patient increased BP upon admission. MD was asked if adding prn hydralazine would be appropriate therapy for elevated BP.  MD Communication/Responsie 12/18/19 @ 0432-On call provider Baltazar Najjar) returns call related to staff nurses' concern over elevated BP. Provider at this time is not concerned about current elevated BP. Continue to monitor

## 2019-12-18 NOTE — Progress Notes (Signed)
Patient seen and assessed. Patient found resting in bed. Physical assessment completed via computerized charting per [facility] policy. On call provider notified related to run of PVCs (second to diruesis with Lasix 80 mg). On cal provider returns call stating that stat labs of BMP and Magnesium have been ordered. Instructed to call with results. Will execute order

## 2019-12-18 NOTE — Progress Notes (Signed)
PROGRESS NOTE    Dawn Fowler  TKW:[id number] DOB: Feb 12, 1966 DOA: 12/17/2019 PCP: Hayden Rasmussen, MD   Brief Narrative:  Dawn Fowler 54 year old female with a history of chronic hypoxic respiratory failure secondary to restrictive lung disease on chronic home O2 (8 L/min baseline), pulmonary hypertension, obstructive sleep apnea on CPAP, stage III chronic kidney disease, obesity, prior h/o PE, and recent hospitalization for COVID-19 pneumonia 09/2019.  According to records, she had a very mild case of Covid and responded very well to IV steroids and remdesivir.  She did not require intubation.  She was discharged home on November 03, 2019.  She presented with worsening shortness of breath, unable to walk few steps and weight gain of about 10 pound in 2 weeks.  She was started on metolazone recently along with increased dose of Lasix but she came to ED because of her symptoms.  Subjective: Her shortness of breath is little improved but not to her baseline.  She was saturating well on 7 L while resting.  She normally goes up to 8 L with ambulation at home.  She was waiting for right heart catheterization later today.  Assessment & Plan:   Active Problems:   Acute respiratory failure with hypoxia (HCC)   Pulmonary hypertension (HCC)   CKD (chronic kidney disease) stage 3, GFR 30-59 ml/min   OSA (obstructive sleep apnea)   Obesity  Acute on chronic respiratory failure with hypoxia (HCC) -in the setting of pulmonary hypertension and possibly fluid overload.  Diuresed well overnight.  Weight of 270 this morning.  Clinically improved symptoms. Heart failure team is following-appreciate their recommendations. -Continue Lasix 80 mg twice daily. -Continue daily weight and strict intake and output. -Daily BMP.  Pulmonary hypertension ([place]).  Patient underwent right heart catheterization with cardiology today which shows markedly elevated right and left heart pressures with severe pulmonary  hypertension. -Continue diuresis. -She will follow up with pulmonary as an outpatient.  CKD (chronic kidney disease) stage 3a, returning at 2.18 with baseline of 1.4-1.5.  Most likely secondary to volume overload. -Monitor renal function while diuresing. -Avoid nephrotoxic.  OSA (obstructive sleep apnea) history of noncompliance with CPAP.  Will need to readdress  . Obesity would benefit from outpatient follow-up and possible referral to bariatric services  Recent Covid infection -currently out of window of infectious disease.  Covid negative.   Objective: Vitals:   12/18/19 1234 12/18/19 1235 12/18/19 1240 12/18/19 1319  BP:  (!) 180/141 (!) 166/128 (!) 142/112  Pulse: 61 78 78 87  Resp:  12 (!) 41 20  Temp:      TempSrc:      SpO2:  95% 93%   Weight:      Height:        Intake/Output Summary (Last 24 hours) at 12/18/2019 1345 Last data filed at 12/18/2019 1113 Gross per 24 hour  Intake --  Output 4650 ml  Net -4650 ml   Filed Weights   12/17/19 1402 12/18/19 0336  Weight: 125.6 kg 122.9 kg    Examination:  General exam: Morbidly obese lady, appears calm and comfortable  Respiratory system: Clear to auscultation. Respiratory effort normal. Cardiovascular system: S1 & S2 heard, RRR. No JVD, murmurs, rubs, gallops or clicks. Gastrointestinal system: Soft, nontender, nondistended, bowel sounds positive. Central nervous system: Alert and oriented. No focal neurological deficits.Symmetric 5 x 5 power. Extremities: 1+LE edema, no cyanosis, pulses intact and symmetrical. Skin: No rashes, lesions or ulcers Psychiatry: Judgement and insight appear normal. Mood & affect  appropriate.    DVT prophylaxis: Lovenox Code Status: Full Family Communication: No family at bedside. Disposition Plan: Pending improvement, most likely will go back home.  Consultants:   Cardiology  Procedures:  Right heart catheterization.  Antimicrobials:   Data Reviewed: I have personally  reviewed following labs and imaging studies  CBC: Recent Labs  Lab 12/17/19 1655 12/18/19 0319 12/18/19 1234 12/18/19 1241  WBC 5.6 5.8  --   --   NEUTROABS 3.5  --   --   --   HGB 13.8 14.2 15.0 15.6*  HCT 45.3 45.5 44.0 46.0  MCV 101.6* 98.9  --   --   PLT 136* 249  --   --    Basic Metabolic Panel: Recent Labs  Lab 12/17/19 1655 12/18/19 0319 12/18/19 1234 12/18/19 1241  NA 138 137 141 139  K 4.6 3.9 3.6 3.6  CL 109 108  --   --   CO2 17* 20*  --   --   GLUCOSE 94 86  --   --   BUN 37* 38*  --   --   CREATININE 2.11* 2.18*  --   --   CALCIUM 9.0 9.0  --   --   MG  --  2.1  --   --   PHOS  --  4.6  --   --    GFR: Estimated Creatinine Clearance: 36.7 mL/min (A) (by C-G formula based on SCr of 2.18 mg/dL (H)). Liver Function Tests: Recent Labs  Lab 12/18/19 0319  AST 33  ALT 54*  ALKPHOS 77  BILITOT 0.6  PROT 5.3*  ALBUMIN 3.0*   No results for input(s): LIPASE, AMYLASE in the last 168 hours. No results for input(s): AMMONIA in the last 168 hours. Coagulation Profile: No results for input(s): INR, PROTIME in the last 168 hours. Cardiac Enzymes: No results for input(s): CKTOTAL, CKMB, CKMBINDEX, TROPONINI in the last 168 hours. BNP (last 3 results) No results for input(s): PROBNP in the last 8760 hours. HbA1C: No results for input(s): HGBA1C in the last 72 hours. CBG: No results for input(s): GLUCAP in the last 168 hours. Lipid Profile: No results for input(s): CHOL, HDL, LDLCALC, TRIG, CHOLHDL, LDLDIRECT in the last 72 hours. Thyroid Function Tests: Recent Labs    12/18/19 0319  TSH 5.125*   Anemia Panel: No results for input(s): VITAMINB12, FOLATE, FERRITIN, TIBC, IRON, RETICCTPCT in the last 72 hours. Sepsis Labs: No results for input(s): PROCALCITON, LATICACIDVEN in the last 168 hours.  Recent Results (from the past 240 hour(s))  Respiratory Panel by RT PCR (Flu A&B, Covid) - Nasopharyngeal Swab     Status: None   Collection Time: 12/17/19   7:48 PM   Specimen: Nasopharyngeal Swab  Result Value Ref Range Status   SARS Coronavirus 2 by RT PCR NEGATIVE NEGATIVE Final    Comment: (NOTE) SARS-CoV-2 target nucleic acids are NOT DETECTED. The SARS-CoV-2 RNA is generally detectable in upper respiratoy specimens during the acute phase of infection. The lowest concentration of SARS-CoV-2 viral copies this assay can detect is 131 copies/mL. A negative result does not preclude SARS-Cov-2 infection and should not be used as the sole basis for treatment or other patient management decisions. A negative result may occur with  improper specimen collection/handling, submission of specimen other than nasopharyngeal swab, presence of viral mutation(s) within the areas targeted by this assay, and inadequate number of viral copies (<131 copies/mL). A negative result must be combined with clinical observations, patient history, and  epidemiological information. The expected result is Negative. Fact Sheet for Patients:  PinkCheek.be Fact Sheet for Healthcare Providers:  GravelBags.it This test is not yet ap proved or cleared by the [place] FDA and  has been authorized for detection and/or diagnosis of SARS-CoV-2 by FDA under an Emergency Use Authorization (EUA). This EUA will remain  in effect (meaning this test can be used) for the duration of the COVID-19 declaration under Section 564(b)(1) of the Act, 21 U.S.C. section 360bbb-3(b)(1), unless the authorization is terminated or revoked sooner.    Influenza A by PCR NEGATIVE NEGATIVE Final   Influenza B by PCR NEGATIVE NEGATIVE Final    Comment: (NOTE) The Xpert Xpress SARS-CoV-2/FLU/RSV assay is intended as an aid in  the diagnosis of influenza from Nasopharyngeal swab specimens and  should not be used as a sole basis for treatment. Nasal washings and  aspirates are unacceptable for Xpert Xpress SARS-CoV-2/FLU/RSV   testing. Fact Sheet for Patients: PinkCheek.be Fact Sheet for Healthcare Providers: GravelBags.it This test is not yet approved or cleared by the [place] FDA and  has been authorized for detection and/or diagnosis of SARS-CoV-2 by  FDA under an Emergency Use Authorization (EUA). This EUA will remain  in effect (meaning this test can be used) for the duration of the  Covid-19 declaration under Section 564(b)(1) of the Act, 21  U.S.C. section 360bbb-3(b)(1), unless the authorization is  terminated or revoked. Performed at [facility], [place] [address], [place], [place] [address]      Radiology Studies: DG Chest 2 View  Result Date: 12/17/2019 CLINICAL DATA:  Shortness of breath.  Fluid retention. EXAM: CHEST - 2 VIEW COMPARISON:  Single-view of the chest 10/29/2019. FINDINGS: There is cardiomegaly without edema. No consolidative process, pneumothorax or effusion. No acute or focal bony abnormality. IMPRESSION: Cardiomegaly without acute disease. Electronically Signed   By: Inge Rise M.D.   On: 12/17/2019 14:31   CARDIAC CATHETERIZATION  Result Date: 12/18/2019 1. Elevated right and left heart filling pressures. 2. Severe pulmonary hypertension, looks like mixed pulmonary venous and pulmonary arterial hypertension.  3. Preserved cardiac output. Needs ongoing diuresis.    Scheduled Meds: . enoxaparin (LOVENOX) injection  40 mg Subcutaneous Q24H  . furosemide  80 mg Intravenous BID  . hydrALAZINE  75 mg Oral Q8H  . NIFEdipine  60 mg Oral Daily  . potassium chloride  20 mEq Oral Once  . sodium chloride flush  3 mL Intravenous Q12H  . sodium chloride flush  3 mL Intravenous Q12H   Continuous Infusions: . sodium chloride       LOS: 1 day   Time spent: 40 minutes.  Lorella Nimrod, MD [facility]  If 7PM-7AM, please contact night-coverage [email or web address]  12/18/2019, 1:45 PM   This record has been  created using Systems analyst. Errors have been sought and corrected,but may not always be located. Such creation errors do not reflect on the standard of care.

## 2019-12-18 NOTE — Consult Note (Addendum)
Advanced Heart Failure Team Consult Note   Primary Physician: Hayden Rasmussen, MD PCP-Cardiologist:  No primary care provider on file.  AHFC: New   Reason for Consultation: Pulmonary hypertension  HPI:    Dawn Fowler is seen today for evaluation of pulmonary hypertension at the request of Dr. Reesa Chew, internal medicine.  54 year old female with a history of chronic hypoxic respiratory failure secondary to restrictive lung disease on chronic home O2 (8 L/min baseline), pulmonary hypertension, obstructive sleep apnea on CPAP, stage III chronic kidney disease, obesity, prior h/o PE, and recent hospitalization for COVID-19 pneumonia 09/2019.  According to records, she had a very mild case of Covid and responded very well to IV steroids and remdesivir.  She did not require intubation.  She was discharged home on November 03, 2019.    Also of note, she was previously followed by [name] health [facility] and underwent extensive work-up for pulmonary hypertension in 2016.  Echocardiogram at that time showed moderate pulmonary hypertension, RVSP 40 to 50 mmHg.  Left ventricular EF normal 60 to 65%, moderate diastolic dysfunction.  No significant valvular disease.  PFTs demonstrated FEV1/FVC 83%.  VQ scan very low probability for PE. 6-minute walk test showed impaired exercise tolerance.  Following discharge from [facility], she had post hospital follow-up at [facility] pulmonology clinic 1/21 and was seen by Dr. Loanne Drilling, and was fairly stable at that time from a respiratory standpoint.  However, over the last several weeks she has developed worsening dyspnea along with progressive weight gain of 10+ pounds leading to increase in home diuretics.  Lasix was recently increased and metolazone was added without improvement. Yesterday, she developed symptoms w/ minimal activity prompting her to seek emergency evaluation.  She was brought in to the [facility] ED by EMS on 2/17.  ED course: Hypoxic on  arrival, SPO2 88%.  Tachypneic RR 24 tachycardic pulse rate 102.  Afebrile.  Hypertensive 138/112.  Pertinent labs: Covid negative, BNP 958.  Sensitivity troponin, 22>> 21.  WBC 5.6.  Hemoglobin 13.8.  Basic metabolic panel consistent with acute kidney injury.  Serum creatinine elevated at 2.11 (baseline 1.5).  K 4.6.  She was admitted by internal medicine and started on IV Lasix 60 mg twice daily.  Good urinary response thus far.  -3.2 L out overnight.  Serum creatinine unchanged at 2.18.  2D echo pending.    Previous Studies ([facility]) 2D echo 12/21/2014  Interpretation Summary -- Normal biventricular systolic function; LVEF 06-23%. -- Moderate pulmonary hypertension. RVSP 40-50 mmHg. RV normal -- Mild diastolic dysfunction. -- No significant valvular disease.  VQ scan 12/25/2014 IMPRESSION: Mild obstructive airway disease. Very low probability for pulmonary embolus.  6-minute walk test 12/24/2014 Interpretation   Simple pulmonary stress test (6 minute walk test) today shows impaired exercise tolerance with 115.2 meters walked in 6 minutes.  Spirometry 02/11/2015 Component Name Value Ref Range  FEV1 1.78 liters  FVC 2.14 liters  FEV1/FVC 83 %  TLC  liters  DLCO  ml/mmHg sec  PEAK FLOW  20 - 800 L/MIN    Review of Systems: [y] = yes, [ ]  = no   . General: Weight gain [ ] ; Weight loss [ ] ; Anorexia [ ] ; Fatigue [ ] ; Fever [ ] ; Chills [ ] ; Weakness [ ]   . Cardiac: Chest pain/pressure [ ] ; Resting SOB [ ] ; Exertional SOB [ ] ; Orthopnea [ ] ; Pedal Edema [ ] ; Palpitations [ ] ; Syncope [ ] ; Presyncope [ ] ; Paroxysmal nocturnal dyspnea[ ]   .  Pulmonary: Cough [ ] ; Wheezing[ ] ; Hemoptysis[ ] ; Sputum [ ] ; Snoring [ ]   . GI: Vomiting[ ] ; Dysphagia[ ] ; Melena[ ] ; Hematochezia [ ] ; Heartburn[ ] ; Abdominal pain [ ] ; Constipation [ ] ; Diarrhea [ ] ; BRBPR [ ]   . GU: Hematuria[ ] ; Dysuria [ ] ; Nocturia[ ]   . Vascular: Pain in legs with walking [ ] ; Pain in feet with lying flat [ ] ; Non-healing  sores [ ] ; Stroke [ ] ; TIA [ ] ; Slurred speech [ ] ;  . Neuro: Headaches[ ] ; Vertigo[ ] ; Seizures[ ] ; Paresthesias[ ] ;Blurred vision [ ] ; Diplopia [ ] ; Vision changes [ ]   . Ortho/Skin: Arthritis [ ] ; Joint pain [ ] ; Muscle pain [ ] ; Joint swelling [ ] ; Back Pain [ ] ; Rash [ ]   . Psych: Depression[ ] ; Anxiety[ ]   . Heme: Bleeding problems [ ] ; Clotting disorders [ ] ; Anemia [ ]   . Endocrine: Diabetes [ ] ; Thyroid dysfunction[ ]   Home Medications Prior to Admission medications   Medication Sig Start Date End Date Taking? Authorizing Provider  albuterol (VENTOLIN HFA) 108 (90 Base) MCG/ACT inhaler Inhale 2 puffs into the lungs every 6 (six) hours as needed for wheezing or shortness of breath. 11/03/19  Yes Thurnell Lose, MD  allopurinol (ZYLOPRIM) 100 MG tablet Take 100 mg by mouth daily.   Yes [provider]  ALYQ 20 MG tablet Take 20 mg by mouth daily. 09/24/19  Yes [provider]  Cholecalciferol (VITAMIN D-3) 125 MCG (5000 UT) TABS Take 5,000 mg by mouth daily.   Yes [provider]  furosemide (LASIX) 40 MG tablet Take 40 mg by mouth 2 (two) times daily. 10/07/19  Yes [provider]  hydrALAZINE (APRESOLINE) 50 MG tablet Take 1 tablet (50 mg total) by mouth every 8 (eight) hours. 11/03/19  Yes Thurnell Lose, MD  metolazone (ZAROXOLYN) 2.5 MG tablet Take 1 tablet (2.5 mg total) by mouth daily. 12/16/19  Yes Margaretha Seeds, MD  NIFEdipine (PROCARDIA XL/NIFEDICAL XL) 60 MG 24 hr tablet Take 60 mg by mouth daily.   Yes [provider]  OPSUMIT 10 MG tablet Take 10 mg by mouth daily. 09/22/19  Yes [provider]  potassium chloride 20 MEQ TBCR -Take 20 mEQ twice a day. -Take an additional 60 mEQ with metolazone Patient taking differently: Take 1 tablet by mouth in the morning and at bedtime. -Take 20 mEQ twice a day. -Take an additional 30 mEQ with metolazone 12/16/19  Yes Margaretha Seeds, MD  PREMARIN 0.625 MG tablet Take 0.625 mg  by mouth daily. 09/01/19  Yes [provider]  sildenafil (REVATIO) 20 MG tablet Take 20 mg by mouth daily.   Yes [provider]  telmisartan (MICARDIS) 40 MG tablet Take 20 mg by mouth daily. 10/07/19  Yes [provider]  torsemide (DEMADEX) 20 MG tablet Take 2 tablets (40 mg total) by mouth 2 (two) times daily for 5 days. 12/16/19 12/21/19 Yes Margaretha Seeds, MD    Past Medical History: Past Medical History:  Diagnosis Date  . Acute respiratory failure due to COVID-19 ([facility]) 10/30/2019  . CKD (chronic kidney disease), stage III   . HTN (hypertension)   . Pulmonary HTN ([place])   . Sleep apnea    on CPAP    Past Surgical History: Past Surgical History:  Procedure Laterality Date  . CESAREAN SECTION    . lumbar surgery      Family History: Family History  Problem Relation Age of Onset  .  COPD Father   . Hypertension Brother   . Pulmonary Hypertension Neg Hx     Social History: Social History   Socioeconomic History  . Marital status: Single    Spouse name: Not on file  . Number of children: Not on file  . Years of education: Not on file  . Highest education level: Not on file  Occupational History  . Not on file  Tobacco Use  . Smoking status: Former Research scientist (life sciences)  . Smokeless tobacco: Never Used  . Tobacco comment: only smoked for fun in highschool not even one a day  Substance and Sexual Activity  . Alcohol use: Not Currently  . Drug use: Not Currently  . Sexual activity: Not on file  Other Topics Concern  . Not on file  Social History Narrative  . Not on file   Social Determinants of Health   Financial Resource Strain:   . Difficulty of Paying Living Expenses: Not on file  Food Insecurity:   . Worried About Charity fundraiser in the Last Year: Not on file  . Ran Out of Food in the Last Year: Not on file  Transportation Needs:   . Lack of Transportation (Medical): Not on file  . Lack of Transportation (Non-Medical): Not on file    Physical Activity:   . Days of Exercise per Week: Not on file  . Minutes of Exercise per Session: Not on file  Stress:   . Feeling of Stress : Not on file  Social Connections:   . Frequency of Communication with Friends and Family: Not on file  . Frequency of Social Gatherings with Friends and Family: Not on file  . Attends Religious Services: Not on file  . Active Member of Clubs or Organizations: Not on file  . Attends Archivist Meetings: Not on file  . Marital Status: Not on file    Allergies:  Allergies  Allergen Reactions  . Adhesive [Tape] Hives    Paper tape  . Succinylcholine Other (See Comments)    Have trouble waking up  . Tizanidine Hives    Objective:    Vital Signs:   Temp:  [97.6 F (36.4 C)] 97.6 F (36.4 C) (02/17 1350) Pulse Rate:  [60-102] 80 (02/18 0336) Resp:  [16-24] 18 (02/18 0336) BP: (138-179)/(103-123) 159/115 (02/18 0336) SpO2:  [88 %-100 %] 100 % (02/18 0336) Weight:  [122.9 kg-125.6 kg] 122.9 kg (02/18 0336) Last BM Date: 12/17/19  Weight change: Filed Weights   12/17/19 1402 12/18/19 0336  Weight: 125.6 kg 122.9 kg    Intake/Output:   Intake/Output Summary (Last 24 hours) at 12/18/2019 0734 Last data filed at 12/18/2019 0204 Gross per 24 hour  Intake --  Output 3200 ml  Net -3200 ml      Physical Exam    General:  Well appearing. No resp difficulty HEENT: normal Neck: supple. JVP . Carotids 2+ bilat; no bruits. No lymphadenopathy or thyromegaly appreciated. Cor: PMI nondisplaced. Regular rate & rhythm. No rubs, gallops or murmurs. Lungs: clear Abdomen: soft, nontender, nondistended. No hepatosplenomegaly. No bruits or masses. Good bowel sounds. Extremities: no cyanosis, clubbing, rash, edema Neuro: alert & orientedx3, cranial nerves grossly intact. moves all 4 extremities w/o difficulty. Affect pleasant   EKG    Normal sinus rhythm with PACs, 96 bpm, right axis deviation, low voltage.  Labs   Basic  Metabolic Panel: Recent Labs  Lab 12/17/19 1655 12/18/19 0319  NA 138 137  K 4.6 3.9  CL 109  108  CO2 17* 20*  GLUCOSE 94 86  BUN 37* 38*  CREATININE 2.11* 2.18*  CALCIUM 9.0 9.0  MG  --  2.1  PHOS  --  4.6    Liver Function Tests: Recent Labs  Lab 12/18/19 0319  AST 33  ALT 54*  ALKPHOS 77  BILITOT 0.6  PROT 5.3*  ALBUMIN 3.0*   No results for input(s): LIPASE, AMYLASE in the last 168 hours. No results for input(s): AMMONIA in the last 168 hours.  CBC: Recent Labs  Lab 12/17/19 1655 12/18/19 0319  WBC 5.6 5.8  NEUTROABS 3.5  --   HGB 13.8 14.2  HCT 45.3 45.5  MCV 101.6* 98.9  PLT 136* 249    Cardiac Enzymes: No results for input(s): CKTOTAL, CKMB, CKMBINDEX, TROPONINI in the last 168 hours.  BNP: BNP (last 3 results) Recent Labs    11/02/19 0020 11/03/19 0445 12/17/19 1655  BNP 29.8 25.1 958.5*    ProBNP (last 3 results) No results for input(s): PROBNP in the last 8760 hours.   CBG: No results for input(s): GLUCAP in the last 168 hours.  Coagulation Studies: No results for input(s): LABPROT, INR in the last 72 hours.   Imaging   DG Chest 2 View  Result Date: 12/17/2019 CLINICAL DATA:  Shortness of breath.  Fluid retention. EXAM: CHEST - 2 VIEW COMPARISON:  Single-view of the chest 10/29/2019. FINDINGS: There is cardiomegaly without edema. No consolidative process, pneumothorax or effusion. No acute or focal bony abnormality. IMPRESSION: Cardiomegaly without acute disease. Electronically Signed   By: Inge Rise M.D.   On: 12/17/2019 14:31      Medications:     Current Medications: . enoxaparin (LOVENOX) injection  40 mg Subcutaneous Q24H  . furosemide  60 mg Intravenous BID  . hydrALAZINE  50 mg Oral Q8H  . sodium chloride flush  3 mL Intravenous Q12H     Infusions: . sodium chloride         Assessment/Plan   1.  Pulmonary hypertension: Echo in 2016 showed moderate pulmonary hypertension, RVSP 40 to 50 mmHg.  Left  ventricular EF with diastolic dysfunction and no significant valvular disease.  She has known obstructive sleep apnea and chronic lung disease as well as prior history of PE.  Suspect primarily WHO group 3, and possible contributions of WHO groups 2 and 4. -Continue to diurese with IV Lasix -Update echocardiogram -Plan right heart cath this admit -Needs updated PFTs with DLCO once diuresed -Needs updated sleep study -Check V/Q scan (prior h/o PE) -continue supp daytime O2/ CPAP QHS.  -On sildenafil as outpatient  2. Acute on Chronic Diastolic CHF: Echo in 9233 showed normal EF 60 to 00% but diastolic dysfunction.  BNP elevated at 958.  Volume overloaded on exam. -Continue IV Lasix for diuresis -Update echo -Plan right heart cath -BP control  3.  Acute hypoxic respiratory failure: 2/2 Pulmonary hypertension and acute on chronic diastolic heart failure  4.  AKI on stage III CKD: Serum creatinine 2.1 on admit.  Baseline 1.5. -Monitor with diuresis  5.  Hypertension: BP elevated. -Home nifedipine and sildenafil not ordered on admit.  6. OSA:  - continue CPAP   7. Obesity: Body mass index is 51.19 kg/m. - needs wt loss   Length of Stay: 1  Brittainy Simmons, PA-C  12/18/2019, 7:34 AM  Advanced Heart Failure Team Pager [phone] (M-F; 7a - 4p)  Please contact [facility] for night-coverage after hours (4p -7a ) and weekends on  [email or web address]  Patient seen with PA, agree with the above note.   Patient has a long history of severe OSA, and suspect OHS/OSA.  She is on CPAP at home and 8L home oxygen (says she has been on oxygen for "years." She recently moved to [place].  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  She additionally has significant HTN.  She has been on Opsumit 10 + tadalafil 20 mg daily.  She ran out a couple of weeks  ago.  She does not feel that these medications helped her much. She has a history of prior DVT after long train ride, has had 1 or 2 negative V/Q scans.  She was hospitalized for COVID-19 in 1/21 and says that she has been more short of breath since that time.   She was seen in the pulmonary office here on 2/16 and was noted to be volume overloaded, Lasix was changed to torsemide but she has not started the torsemide. She reports a 20 lb weight gain over the last few weeks and increased dyspnea.  She came to the ER 2/17 due to dyspnea and was admitted.   General: NAD Neck: JVP 14-16 cm, no thyromegaly or thyroid nodule.  Lungs: Decreased at bases.  CV: Nondisplaced PMI.  Heart regular S1/S2, no S3/S4, no murmur. 1+ ankle edema L>R.  No carotid bruit.  Normal pedal pulses.  Abdomen: Soft, nontender, no hepatosplenomegaly, no distention.  Skin: Intact without lesions or rashes.  Neurologic: Alert and oriented x 3.  Psych: Normal affect. Extremities: No clubbing or cyanosis.  HEENT: Normal.   1. Acute on chronic diastolic CHF with suspected significant RV dysfunction: Echo done in 2016 showed normal LV EF, RV reportedly ok.  No recent echo available.  Last cath from 2019 showed mixed pulmonary venous/pulmonary arterial hypertension so suspect a mix of diastolic LV dysfunction and RV failure from pulmonary hypertension. On exam, she is significantly volume overloaded.  Creatinine up to 2.1 from baseline around 1.5 but stable with diuresis overnight.  - Continue Lasix at 80 mg IV bid.  Hopefully with lowering of renal venous pressure, creatinine will stabilize to improve.  - I will plan RHC today to assess filling pressures and PA pressure. We discussed risks/benefits and she agrees to procedure.  2. Pulmonary hypertension: I suspect that this is a mixed picture with group 2 and group 3 PH (related to diastolic LV dysfunction and OHS/OSA).  I am not sure that she gets much benefit from Opsumit and  tadalafil (currently out of them as well).  V/Q scan in 2019 was not suggestive of chronic PEs and CT chest in 2019 was not suggestive of ILD. HIV was negative.   - For now, will diurese aggressively to try to get filling pressures down.  - RHC today as above for full assessment.  - Once diuresed, would plan for full PFTs as well as repeat high resolution CT chest. May be worthwhile to repeat V/Q.  - Will send rheumatological serologies: scleroderma w/u, ANA, RF.  - I suspect that the key to treatment will be maintaining oxygen saturation with home O2 and CPAP.  Weight loss also would be helpful.  3. HTN: BP markedly elevated today, she is off her home meds.  - Restart nifedipine XR at 60 mg daily.  - Continue hydralazine 50 mg tid.  - Will hold  off on telmisartan for now with elevated creatinine.  4. AKI and CKD stage 3: Hold telmisartan.  As above, hopefully will see some improvement with diuresis.   [name] 12/18/2019 9:44 AM

## 2019-12-18 NOTE — Progress Notes (Signed)
PT Cancellation Note  Patient Details Name: Dawn Fowler MRN: [id number] DOB: 06/28/1966   Cancelled Treatment:    Reason Eval/Treat Not Completed: Medical issues which prohibited therapy per chart review, patient with elevated diastolic BP. Discussed with RN, who reports per MD orders she will not be getting BP meds until 2PM, OK to try back after that time but would rather we hold off this morning. Will attempt to return later in the day if time/schedule allow and if she is medically appropriate.    Windell Norfolk, DPT, PN1   Supplemental Physical Therapist [facility]    Pager [phone] Acute Rehab Office [phone]

## 2019-12-18 NOTE — Progress Notes (Signed)
OT Cancellation Note  Patient Details Name: Nataki Mccrumb MRN: [id number] DOB: 18-May-1966   Cancelled Treatment:     Consulted with physical therapy this morning, reports RN ask to hold therapy until afternoon once blood pressure medication has been given at approx 1400. Will re-attempt as schedule allows.   Delbert Phenix OT OT office: [phone]   Rosemary Holms 12/18/2019, 11:03 AM

## 2019-12-18 NOTE — ED Notes (Signed)
Attempted to call report, RN states she will call back in a couple of  Minutes

## 2019-12-18 NOTE — Progress Notes (Signed)
  Echocardiogram 2D Echocardiogram has been performed.  Dawn Fowler [name] 12/18/2019, 11:09 AM

## 2019-12-19 ENCOUNTER — Inpatient Hospital Stay (HOSPITAL_COMMUNITY): Payer: Medicare Other

## 2019-12-19 DIAGNOSIS — I5033 Acute on chronic diastolic (congestive) heart failure: Secondary | ICD-10-CM

## 2019-12-19 LAB — BASIC METABOLIC PANEL
Anion gap: 14 (ref 5–15)
BUN: 38 mg/dL — ABNORMAL HIGH (ref 6–20)
CO2: 23 mmol/L (ref 22–32)
Calcium: 9.3 mg/dL (ref 8.9–10.3)
Chloride: 101 mmol/L (ref 98–111)
Creatinine, Ser: 2.04 mg/dL — ABNORMAL HIGH (ref 0.44–1.00)
GFR calc Af Amer: 31 mL/min — ABNORMAL LOW (ref >60)
GFR calc non Af Amer: 27 mL/min — ABNORMAL LOW (ref >60)
Glucose, Bld: 94 mg/dL (ref 70–99)
Potassium: 3.8 mmol/L (ref 3.5–5.1)
Sodium: 138 mmol/L (ref 135–145)

## 2019-12-19 LAB — TROPONIN I (HIGH SENSITIVITY)
Troponin I (High Sensitivity): 14 ng/L (ref <18)
Troponin I (High Sensitivity): 16 ng/L (ref <18)

## 2019-12-19 LAB — MAGNESIUM: Magnesium: 2.1 mg/dL (ref 1.7–2.4)

## 2019-12-19 LAB — ANTI-JO 1 ANTIBODY, IGG: Anti JO-1: <0.2 AI (ref 0.0–0.9)

## 2019-12-19 LAB — ANTI-SCLERODERMA ANTIBODY: Scleroderma (Scl-70) (ENA) Antibody, IgG: <0.2 AI (ref 0.0–0.9)

## 2019-12-19 LAB — RHEUMATOID FACTOR: Rheumatoid fact SerPl-aCnc: <10 IU/mL (ref 0.0–13.9)

## 2019-12-19 LAB — ANA: Anti Nuclear Antibody (ANA): NEGATIVE

## 2019-12-19 MED ORDER — TECHNETIUM TO 99M ALBUMIN AGGREGATED
1.6000 | Freq: Once | INTRAVENOUS | Status: AC | PRN
  Administered 2019-12-19: 1.6 via INTRAVENOUS

## 2019-12-19 MED ORDER — POTASSIUM CHLORIDE CRYS ER 20 MEQ PO TBCR
40.0000 meq | EXTENDED_RELEASE_TABLET | Freq: Once | ORAL | Status: AC
  Administered 2019-12-19: 10:00:00 40 meq via ORAL
  Filled 2019-12-19: qty 2

## 2019-12-19 MED ORDER — GABAPENTIN 100 MG PO CAPS
100.0000 mg | ORAL_CAPSULE | Freq: Three times a day (TID) | ORAL | Status: AC
  Administered 2019-12-19 – 2019-12-21 (×6): 100 mg via ORAL
  Filled 2019-12-19 (×6): qty 1

## 2019-12-19 MED ORDER — SALINE SPRAY 0.65 % NA SOLN
1.0000 | NASAL | Status: DC | PRN
  Filled 2019-12-19: qty 44

## 2019-12-19 MED ORDER — IPRATROPIUM-ALBUTEROL 0.5-2.5 (3) MG/3ML IN SOLN: 3.0000 mL | Freq: Four times a day (QID) | RESPIRATORY_TRACT | Status: DC | PRN

## 2019-12-19 MED ORDER — FLUTICASONE PROPIONATE 50 MCG/ACT NA SUSP
2.0000 | Freq: Every day | NASAL | Status: AC
  Administered 2019-12-19 – 2019-12-21 (×3): 2 via NASAL
  Filled 2019-12-19: qty 16

## 2019-12-19 NOTE — Progress Notes (Signed)
Patient refused CPAP.

## 2019-12-19 NOTE — Progress Notes (Signed)
PT Cancellation Note  Patient Details Name: Dawn Fowler MRN: [id number] DOB: 03-Mar-1966   Cancelled Treatment:    Reason Eval/Treat Not Completed: Medical issues which prohibited therapy spoke to RN who requests hold this morning as they are doing VQ scan soon. Will hold off this morning and try to check back in PM if time/schedule allow.    Windell Norfolk, DPT, PN1   Supplemental Physical Therapist [facility]    Pager [phone] Acute Rehab Office [phone]

## 2019-12-19 NOTE — Progress Notes (Signed)
Nursing Rounds completed. Patient complains of chest pain (6/10) non radiating. No nausea/ vomiting expresses. Vital signs obtained and recorded. On call provider Marletta Lor) notified of patient complaint of chest pain. Verbal order received to obtain STAT EKG and to give Tylenol (anti inflammatory properties). Will notify on call provider with STAT EKG

## 2019-12-19 NOTE — Progress Notes (Signed)
OT Cancellation Note  Patient Details Name: Dawn Fowler MRN: [id number] DOB: February 19, 1966   Cancelled Treatment:     Patient currently off the floor for VQ scan per nurse. Will re-attempt as schedule permits/medically stable.  [place] OT office: [place] 12/19/2019, 11:19 AM

## 2019-12-19 NOTE — Progress Notes (Signed)
Patient ID: Dawn Fowler, female   DOB: August 04, 1966, 54 y.o.   MRN: [id number]     Advanced Heart Failure Rounding Note  PCP-Cardiologist: No primary care provider on file.   Subjective:    Good diuresis yesterday, weight coming down.  Feeling better.   Echo: EF 55-60%, moderately dilated RV with moderately decreased systolic function.   RHC Procedural Findings: Hemodynamics (mmHg) RA mean 14 RV 87/21 PA 89/33, mean 54 PCWP mean 34 Oxygen saturations: SVC 63% PA 61% AO 94% Cardiac Output (Fick) 5.17  Cardiac Index (Fick) 2.22 PVR 3.87 WU   Objective:   Weight Range: 117.9 kg Body mass index is 49.11 kg/m.   Vital Signs:   Temp:  [97.5 F (36.4 C)-98.1 F (36.7 C)] 97.5 F (36.4 C) (02/19 0842) Pulse Rate:  [56-90] 90 (02/19 0842) Resp:  [12-41] 20 (02/19 0658) BP: (118-193)/(82-145) 120/83 (02/19 0842) SpO2:  [86 %-100 %] 94 % (02/19 0842) Weight:  [117.9 kg] 117.9 kg (02/19 0506) Last BM Date: 12/17/19  Weight change: Filed Weights   12/17/19 1402 12/18/19 0336 12/19/19 0506  Weight: 125.6 kg 122.9 kg 117.9 kg    Intake/Output:   Intake/Output Summary (Last 24 hours) at 12/19/2019 0856 Last data filed at 12/19/2019 0236 Gross per 24 hour  Intake 804.26 ml  Output 4100 ml  Net -3295.74 ml      Physical Exam    General:  Well appearing. No resp difficulty. Obese.  HEENT: Normal Neck: Supple. JVP 12 cm. Carotids 2+ bilat; no bruits. No lymphadenopathy or thyromegaly appreciated. Cor: PMI nondisplaced. Regular rate & rhythm. No rubs, gallops or murmurs. Lungs: Clear Abdomen: Soft, nontender, nondistended. No hepatosplenomegaly. No bruits or masses. Good bowel sounds. Extremities: No cyanosis, clubbing, rash, edema Neuro: Alert & orientedx3, cranial nerves grossly intact. moves all 4 extremities w/o difficulty. Affect pleasant   Telemetry   NSR 80s (personally reviewed)   Labs    CBC Recent Labs    12/17/19 1655 12/17/19 1655  12/18/19 0319 12/18/19 0319 12/18/19 1234 12/18/19 1241  WBC 5.6  --  5.8  --   --   --   NEUTROABS 3.5  --   --   --   --   --   HGB 13.8   < > 14.2   < > 15.0 15.6*  HCT 45.3   < > 45.5   < > 44.0 46.0  MCV 101.6*  --  98.9  --   --   --   PLT 136*  --  249  --   --   --    < > = values in this interval not displayed.   Basic Metabolic Panel Recent Labs    12/18/19 0319 12/18/19 1234 12/18/19 2017 12/19/19 0319  NA 137   < > 139 138  K 3.9   < > 4.5 3.8  CL 108  --  105 101  CO2 20*  --  25 23  GLUCOSE 86  --  117* 94  BUN 38*  --  38* 38*  CREATININE 2.18*  --  2.33* 2.04*  CALCIUM 9.0  --  9.4 9.3  MG 2.1  --  1.8 2.1  PHOS 4.6  --   --   --    < > = values in this interval not displayed.   Liver Function Tests Recent Labs    12/18/19 0319  AST 33  ALT 54*  ALKPHOS 77  BILITOT 0.6  PROT 5.3*  ALBUMIN 3.0*  No results for input(s): LIPASE, AMYLASE in the last 72 hours. Cardiac Enzymes No results for input(s): CKTOTAL, CKMB, CKMBINDEX, TROPONINI in the last 72 hours.  BNP: BNP (last 3 results) Recent Labs    11/02/19 0020 11/03/19 0445 12/17/19 1655  BNP 29.8 25.1 958.5*    ProBNP (last 3 results) No results for input(s): PROBNP in the last 8760 hours.   D-Dimer Recent Labs    12/18/19 1458  DDIMER 0.91*   Hemoglobin A1C No results for input(s): HGBA1C in the last 72 hours. Fasting Lipid Panel No results for input(s): CHOL, HDL, LDLCALC, TRIG, CHOLHDL, LDLDIRECT in the last 72 hours. Thyroid Function Tests Recent Labs    12/18/19 0319  TSH 5.125*    Other results:   Imaging    CARDIAC CATHETERIZATION  Result Date: 12/18/2019 1. Elevated right and left heart filling pressures. 2. Severe pulmonary hypertension, looks like mixed pulmonary venous and pulmonary arterial hypertension.  3. Preserved cardiac output. Needs ongoing diuresis.   DG CHEST PORT 1 VIEW  Result Date: 12/19/2019 CLINICAL DATA:  Chest pain EXAM: PORTABLE  CHEST 1 VIEW COMPARISON:  12/17/2019 FINDINGS: Moderate cardiomegaly. No focal airspace consolidation. No pleural effusion or pneumothorax. IMPRESSION: Cardiomegaly without acute airspace disease. Electronically Signed   By: Ulyses Jarred M.D.   On: 12/19/2019 03:34   ECHOCARDIOGRAM COMPLETE  Result Date: 12/18/2019    ECHOCARDIOGRAM REPORT   Patient Name:   Dawn Fowler Date of Exam: 12/18/2019 Medical Rec #:  [id number]       Height:       61.0 in Accession #:    [id number]      Weight:       270.9 lb Date of Birth:  03-02-1966        BSA:          2.15 m Patient Age:    54 years        BP:           159/115 mmHg Patient Gender: F               HR:           78 bpm. Exam Location:  Inpatient Procedure: 2D Echo, Cardiac Doppler and Color Doppler Indications:    Dyspnea 786.09 / R06.00  History:        Patient has no prior history of Echocardiogram examinations.                 Pulmonary HTN; Risk Factors:Hypertension and Sleep Apnea.                 COVID19. CKD.  Sonographer:    Jonelle Sidle Dance Referring Phys: [id number] [place]  1. Left ventricular ejection fraction, by estimation, is 55 to 60%. The left ventricle has normal function. The left ventricle has no regional wall motion abnormalities. Left ventricular diastolic parameters are consistent with Grade I diastolic dysfunction (impaired relaxation).  2. Right ventricular systolic function is moderately reduced. The right ventricular size is moderately enlarged. There is severely elevated pulmonary artery systolic pressure. The estimated right ventricular systolic pressure is 63.0 mmHg. D-shaped interventricular septum is suggestive of RV pressure/volume overload.  3. Right atrial size was moderately dilated.  4. The mitral valve is normal in structure and function. Trivial mitral valve regurgitation. No evidence of mitral stenosis.  5. The aortic valve is tricuspid. Aortic valve regurgitation is not visualized. No aortic stenosis is  present.  6. The inferior vena cava is normal  in size with <50% respiratory variability, suggesting right atrial pressure of 8 mmHg. FINDINGS  Left Ventricle: Left ventricular ejection fraction, by estimation, is 55 to 60%. The left ventricle has normal function. The left ventricle has no regional wall motion abnormalities. The left ventricular internal cavity size was normal in size. There is  no left ventricular hypertrophy. Left ventricular diastolic parameters are consistent with Grade I diastolic dysfunction (impaired relaxation). Right Ventricle: The right ventricular size is moderately enlarged. No increase in right ventricular wall thickness. Right ventricular systolic function is moderately reduced. There is severely elevated pulmonary artery systolic pressure. The tricuspid regurgitant velocity is 4.15 m/s, and with an assumed right atrial pressure of 8 mmHg, the estimated right ventricular systolic pressure is 16.1 mmHg. Left Atrium: Left atrial size was normal in size. Right Atrium: Right atrial size was moderately dilated. Pericardium: Trivial pericardial effusion is present. Mitral Valve: The mitral valve is normal in structure and function. Trivial mitral valve regurgitation. No evidence of mitral valve stenosis. Tricuspid Valve: The tricuspid valve is normal in structure. Tricuspid valve regurgitation is mild. Aortic Valve: The aortic valve is tricuspid. Aortic valve regurgitation is not visualized. No aortic stenosis is present. Pulmonic Valve: The pulmonic valve was normal in structure. Pulmonic valve regurgitation is mild to moderate. Aorta: The aortic root is normal in size and structure. Venous: The inferior vena cava is normal in size with less than 50% respiratory variability, suggesting right atrial pressure of 8 mmHg. IAS/Shunts: No atrial level shunt detected by color flow Doppler.  LEFT VENTRICLE PLAX 2D LVIDd:         4.30 cm  Diastology LVIDs:         3.20 cm  LV e' lateral:   4.43 cm/s  LV PW:         1.20 cm  LV E/e' lateral: 12.7 LV IVS:        0.90 cm  LV e' medial:    4.65 cm/s LVOT diam:     1.80 cm  LV E/e' medial:  12.1 LV SV:         37.28 ml LV SV Index:   17.68 LVOT Area:     2.54 cm  RIGHT VENTRICLE            IVC RV Basal diam:  3.00 cm    IVC diam: 2.00 cm RV Mid diam:    3.30 cm RV S prime:     6.68 cm/s TAPSE (M-mode): 1.7 cm LEFT ATRIUM             Index       RIGHT ATRIUM           Index LA diam:        4.60 cm 2.14 cm/m  RA Area:     28.10 cm LA Vol (A2C):   36.1 ml 16.80 ml/m RA Volume:   98.60 ml  45.89 ml/m LA Vol (A4C):   24.7 ml 11.50 ml/m LA Biplane Vol: 30.4 ml 14.15 ml/m  AORTIC VALVE LVOT Vmax:   90.40 cm/s LVOT Vmean:  57.400 cm/s LVOT VTI:    0.147 m  AORTA Ao Root diam: 3.20 cm Ao Asc diam:  3.20 cm MITRAL VALVE                TRICUSPID VALVE MV Area (PHT): 2.32 cm     TR Peak grad:   68.9 mmHg MV Decel Time: 327 msec     TR Vmax:  415.00 cm/s MV E velocity: 56.20 cm/s MV A velocity: 117.00 cm/s  SHUNTS MV E/A ratio:  0.48         Systemic VTI:  0.15 m                             Systemic Diam: 1.80 cm [name] MD Electronically signed by [name] MD Signature Date/Time: 12/18/2019/3:12:57 PM    Final       Medications:     Scheduled Medications: . enoxaparin (LOVENOX) injection  40 mg Subcutaneous Q24H  . furosemide  80 mg Intravenous BID  . hydrALAZINE  75 mg Oral Q8H  . NIFEdipine  60 mg Oral Daily  . potassium chloride  40 mEq Oral Once  . sodium chloride flush  3 mL Intravenous Q12H  . sodium chloride flush  3 mL Intravenous Q12H     Infusions: . sodium chloride       PRN Medications:  sodium chloride, acetaminophen **OR** acetaminophen, HYDROcodone-acetaminophen, ondansetron **OR** ondansetron (ZOFRAN) IV, sodium chloride flush    Assessment/Plan   1. Acute on chronic diastolic CHF with significant RV dysfunction: Echo with LV E 55-60%, moderate RV dilation/moderate RV dysfunction.  [place] 2/18 showed elevated  left and right heart filling pressures with severe pulmonary hypertension.  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  She diuresed well overnight, weight down and creatinine lower at 2.04.  She is still volume overloaded on exam.  - Continue Lasix at 80 mg IV bid.  Hopefully with lowering of renal venous pressure, creatinine will continue to improve.  2. Pulmonary hypertension: I suspect that this is a mixed picture with group 2 and group 3 PH (related to diastolic LV dysfunction and OHS/OSA).  I am not sure that she gets much benefit from Opsumit and tadalafil (currently out of them as well).  V/Q scan in 2019 was not suggestive of chronic PEs and CT chest in 2019 was not suggestive of ILD. HIV was negative.  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  - For now, will diurese aggressively to try to get filling pressures down.  - Once fully diuresed, would plan for full PFTs as well as repeat high resolution CT chest.  - Today, will repeat V/Q scan to rule out evidence for chronic PEs.  - Sent rheumatological serologies: scleroderma w/u, ANA, RF.  - I suspect that the key to treatment will be maintaining oxygen saturation with home O2 and CPAP.  Weight loss also would be helpful.  3. HTN: BP improved today.   - Continue nifedipine XR at 60 mg daily.  - Continue hydralazine 75 mg tid.  - Will hold off on telmisartan for now with elevated creatinine.  4. AKI and CKD stage 3: Hold telmisartan.  As above, hopefully will see some improvement with diuresis.   Length of Stay: 2  [name], MD  12/19/2019, 8:56 AM  Advanced Heart Failure Team Pager [phone] (M-F; 7a - 4p)  Please contact [facility] for night-coverage after hours (4p -7a ) and weekends on [email or web address]

## 2019-12-19 NOTE — Progress Notes (Signed)
PROGRESS NOTE  Dawn Fowler OIZ:[id number] DOB: August 17, 1966 DOA: 12/17/2019 PCP: Hayden Rasmussen, MD  HPI/Recap of past 24 hours: Dawn McKinley54 year old female with a history of chronic hypoxic respiratory failure secondary to restrictive lung disease on chronic home O2 (8 L/min baseline), pulmonary hypertension, obstructive sleep apneaon CPAP,stage III chronic kidney disease,obesity, prior h/o PE,and recent hospitalization for COVID-19 pneumonia 09/2019.According to records, she had avery mild case of Covid and responded very well to IV steroids and remdesivir. She did not require intubation. She was discharged home on November 03, 2019.  She presented with worsening shortness of breath, unable to walk few steps and weight gain of about 10 pound in 2 weeks.  She was started on metolazone recently along with increased dose of Lasix but she came to ED because of her symptoms.  2/19: Seen and examined.  Reports she feels better after diuresing.  Heart failure team following.  Assessment/Plan: Active Problems:   Acute respiratory failure with hypoxia (HCC)   Pulmonary hypertension (HCC)   CKD (chronic kidney disease) stage 3, GFR 30-59 ml/min   OSA (obstructive sleep apnea)   Obesity   Exertional dyspnea   Pitting edema  Acute on chronic hypoxic respiratory failure secondary to acute on chronic diastolic CHF with significant RV dysfunction Management according to heart failure team. Ongoing diuresing with improvement of O2 saturation. Personally reviewed chest x-ray which showed pulmonary edema. Continue to maintain O2 saturation greater than 92% Bronchodilators as needed Currently on 5 L with O2 saturation 94%. Will need home O2 evaluation when close to discharge.  Acute on chronic diastolic CHF 2D echo showed preserved LVEF 55 to 60% with moderate RV dilatation/RV dysfunction.  Severe pulmonary hypertension. Ongoing diuresing, continue to monitor blood pressure, renal  function, and electrolytes while on diuretics Net I&O -6.7 L Continue strict I's and O's and daily weight  AKI on CKD 3B Baseline creatinine appears to be 2.0 with GFR of 31. Presented with creatinine of 2.3 with GFR of 27. Good urine output Continue to hold ARB, avoid nephrotoxins and hypotension Continue to closely monitor renal function, urine output and electrolytes while on diuretics  Severe pulmonary hypertension likely related to OSA Managed by heart failure team  Essential hypertension Blood pressure is at goal. Continue antihypertensive as recommended by heart failure team.  OSA Continue CPAP at night  History of COVID-19 viral pneumonia. Per H&P: discharged 11/03/19 which noted treated with steroids and remdesivir. Out of quarantine window.  Obesity BMI 49 Recommend weight loss outpatient with regular physical activity and healthy dieting.    Code Status: Full code  Family Communication: None at bedside  Disposition Plan: Patient is from home.  Anticipate discharge to home when heart failure team signs off.  Barrier to discharge ongoing diuresing.   Consultants:  Advanced heart failure team.  Procedures:  None  Antimicrobials:  None  DVT prophylaxis: Subcu Lovenox daily   Objective: Vitals:   12/19/19 0238 12/19/19 0506 12/19/19 0658 12/19/19 0842  BP: 133/84 (!) 136/94 128/82 120/83  Pulse: 65 74 65 90  Resp:  20 20   Temp:  97.6 F (36.4 C)  (!) 97.5 F (36.4 C)  TempSrc:  Oral  Oral  SpO2: 98% 93%  94%  Weight:  117.9 kg    Height:        Intake/Output Summary (Last 24 hours) at 12/19/2019 1359 Last data filed at 12/19/2019 0930 Gross per 24 hour  Intake 612.02 ml  Output 2950 ml  Net -2337.98  ml   Filed Weights   12/17/19 1402 12/18/19 0336 12/19/19 0506  Weight: 125.6 kg 122.9 kg 117.9 kg    Exam:  . General: 54 y.o. year-old female well developed well nourished in no acute distress.  Alert and oriented  x3. . Cardiovascular: Regular rate and rhythm with no rubs or gallops.  No thyromegaly or JVD noted.   Marland Kitchen Respiratory: Mild rales at bases no wheezing noted.  Good respiratory effort.  . Abdomen: Soft nontender nondistended with normal bowel sounds x4 quadrants. . Musculoskeletal: Trace lower extremity edema. 2/4 pulses in all 4 extremities. Marland Kitchen Psychiatry: Mood is appropriate for condition and setting   Data Reviewed: CBC: Recent Labs  Lab 12/17/19 1655 12/18/19 0319 12/18/19 1234 12/18/19 1241  WBC 5.6 5.8  --   --   NEUTROABS 3.5  --   --   --   HGB 13.8 14.2 15.0 15.6*  HCT 45.3 45.5 44.0 46.0  MCV 101.6* 98.9  --   --   PLT 136* 249  --   --    Basic Metabolic Panel: Recent Labs  Lab 12/17/19 1655 12/17/19 1655 12/18/19 0319 12/18/19 1234 12/18/19 1241 12/18/19 2017 12/19/19 0319  NA 138   < > 137 141 139 139 138  K 4.6   < > 3.9 3.6 3.6 4.5 3.8  CL 109  --  108  --   --  105 101  CO2 17*  --  20*  --   --  25 23  GLUCOSE 94  --  86  --   --  117* 94  BUN 37*  --  38*  --   --  38* 38*  CREATININE 2.11*  --  2.18*  --   --  2.33* 2.04*  CALCIUM 9.0  --  9.0  --   --  9.4 9.3  MG  --   --  2.1  --   --  1.8 2.1  PHOS  --   --  4.6  --   --   --   --    < > = values in this interval not displayed.   GFR: Estimated Creatinine Clearance: 38.2 mL/min (A) (by C-G formula based on SCr of 2.04 mg/dL (H)). Liver Function Tests: Recent Labs  Lab 12/18/19 0319  AST 33  ALT 54*  ALKPHOS 77  BILITOT 0.6  PROT 5.3*  ALBUMIN 3.0*   No results for input(s): LIPASE, AMYLASE in the last 168 hours. No results for input(s): AMMONIA in the last 168 hours. Coagulation Profile: No results for input(s): INR, PROTIME in the last 168 hours. Cardiac Enzymes: No results for input(s): CKTOTAL, CKMB, CKMBINDEX, TROPONINI in the last 168 hours. BNP (last 3 results) No results for input(s): PROBNP in the last 8760 hours. HbA1C: No results for input(s): HGBA1C in the last 72  hours. CBG: No results for input(s): GLUCAP in the last 168 hours. Lipid Profile: No results for input(s): CHOL, HDL, LDLCALC, TRIG, CHOLHDL, LDLDIRECT in the last 72 hours. Thyroid Function Tests: Recent Labs    12/18/19 0319  TSH 5.125*   Anemia Panel: No results for input(s): VITAMINB12, FOLATE, FERRITIN, TIBC, IRON, RETICCTPCT in the last 72 hours. Urine analysis:    Component Value Date/Time   COLORURINE YELLOW 12/18/2019 [place] 12/18/2019 0948   LABSPEC 1.006 12/18/2019 0948   PHURINE 5.0 12/18/2019 0948   GLUCOSEU NEGATIVE 12/18/2019 0948   HGBUR LARGE (A) 12/18/2019 0948   BILIRUBINUR NEGATIVE  12/18/2019 [place] 12/18/2019 0948   PROTEINUR 100 (A) 12/18/2019 0948   NITRITE NEGATIVE 12/18/2019 0948   LEUKOCYTESUR NEGATIVE 12/18/2019 0948   Sepsis Labs: @LABRCNTIP (procalcitonin:4,lacticidven:4)  ) Recent Results (from the past 240 hour(s))  Respiratory Panel by RT PCR (Flu A&B, Covid) - Nasopharyngeal Swab     Status: None   Collection Time: 12/17/19  7:48 PM   Specimen: Nasopharyngeal Swab  Result Value Ref Range Status   SARS Coronavirus 2 by RT PCR NEGATIVE NEGATIVE Final    Comment: (NOTE) SARS-CoV-2 target nucleic acids are NOT DETECTED. The SARS-CoV-2 RNA is generally detectable in upper respiratoy specimens during the acute phase of infection. The lowest concentration of SARS-CoV-2 viral copies this assay can detect is 131 copies/mL. A negative result does not preclude SARS-Cov-2 infection and should not be used as the sole basis for treatment or other patient management decisions. A negative result may occur with  improper specimen collection/handling, submission of specimen other than nasopharyngeal swab, presence of viral mutation(s) within the areas targeted by this assay, and inadequate number of viral copies (<131 copies/mL). A negative result must be combined with clinical observations, patient history, and  epidemiological information. The expected result is Negative. Fact Sheet for Patients:  PinkCheek.be Fact Sheet for Healthcare Providers:  GravelBags.it This test is not yet ap proved or cleared by the [place] FDA and  has been authorized for detection and/or diagnosis of SARS-CoV-2 by FDA under an Emergency Use Authorization (EUA). This EUA will remain  in effect (meaning this test can be used) for the duration of the COVID-19 declaration under Section 564(b)(1) of the Act, 21 U.S.C. section 360bbb-3(b)(1), unless the authorization is terminated or revoked sooner.    Influenza A by PCR NEGATIVE NEGATIVE Final   Influenza B by PCR NEGATIVE NEGATIVE Final    Comment: (NOTE) The Xpert Xpress SARS-CoV-2/FLU/RSV assay is intended as an aid in  the diagnosis of influenza from Nasopharyngeal swab specimens and  should not be used as a sole basis for treatment. Nasal washings and  aspirates are unacceptable for Xpert Xpress SARS-CoV-2/FLU/RSV  testing. Fact Sheet for Patients: PinkCheek.be Fact Sheet for Healthcare Providers: GravelBags.it This test is not yet approved or cleared by the [place] FDA and  has been authorized for detection and/or diagnosis of SARS-CoV-2 by  FDA under an Emergency Use Authorization (EUA). This EUA will remain  in effect (meaning this test can be used) for the duration of the  Covid-19 declaration under Section 564(b)(1) of the Act, 21  U.S.C. section 360bbb-3(b)(1), unless the authorization is  terminated or revoked. Performed at [facility], [place] [address], [place], [place] [address]       Studies: NM Pulmonary Perfusion  Result Date: 12/19/2019 CLINICAL DATA:  Short of breath.  Recent cardiac catheterization. EXAM: NUCLEAR MEDICINE PERFUSION LUNG SCAN TECHNIQUE: Perfusion images were obtained in multiple projections  after intravenous injection of radiopharmaceutical. Ventilation scans intentionally deferred if perfusion scan and chest x-ray adequate for interpretation during COVID 19 epidemic. RADIOPHARMACEUTICALS:  1.6 mCi Tc-13m MAA IV COMPARISON:  Chest radiograph 12/19/2019 FINDINGS: No wedge-shaped peripheral perfusion defects within LEFT RIGHT lung to suggest acute pulmonary embolism. Prominent heart shadow noted. IMPRESSION: No evidence acute or chronic pulmonary embolism. Electronically Signed   By: Suzy Bouchard M.D.   On: 12/19/2019 11:26   DG CHEST PORT 1 VIEW  Result Date: 12/19/2019 CLINICAL DATA:  Chest pain EXAM: PORTABLE CHEST 1 VIEW COMPARISON:  12/17/2019 FINDINGS:  Moderate cardiomegaly. No focal airspace consolidation. No pleural effusion or pneumothorax. IMPRESSION: Cardiomegaly without acute airspace disease. Electronically Signed   By: Ulyses Jarred M.D.   On: 12/19/2019 03:34    Scheduled Meds: . enoxaparin (LOVENOX) injection  40 mg Subcutaneous Q24H  . fluticasone  2 spray Each Nare Daily  . furosemide  80 mg Intravenous BID  . hydrALAZINE  75 mg Oral Q8H  . NIFEdipine  60 mg Oral Daily  . sodium chloride flush  3 mL Intravenous Q12H  . sodium chloride flush  3 mL Intravenous Q12H    Continuous Infusions: . sodium chloride       LOS: 2 days     Kayleen Memos, MD [facility] Pager [phone]  If 7PM-7AM, please contact night-coverage [email or web address] Password [facility] 12/19/2019, 1:59 PM

## 2019-12-20 LAB — BASIC METABOLIC PANEL
Anion gap: 12 (ref 5–15)
BUN: 32 mg/dL — ABNORMAL HIGH (ref 6–20)
CO2: 26 mmol/L (ref 22–32)
Calcium: 9 mg/dL (ref 8.9–10.3)
Chloride: 101 mmol/L (ref 98–111)
Creatinine, Ser: 2.27 mg/dL — ABNORMAL HIGH (ref 0.44–1.00)
GFR calc Af Amer: 28 mL/min — ABNORMAL LOW (ref >60)
GFR calc non Af Amer: 24 mL/min — ABNORMAL LOW (ref >60)
Glucose, Bld: 113 mg/dL — ABNORMAL HIGH (ref 70–99)
Potassium: 3.7 mmol/L (ref 3.5–5.1)
Sodium: 139 mmol/L (ref 135–145)

## 2019-12-20 MED ORDER — POTASSIUM CHLORIDE CRYS ER 20 MEQ PO TBCR
40.0000 meq | EXTENDED_RELEASE_TABLET | Freq: Every day | ORAL | Status: DC
  Administered 2019-12-20 – 2019-12-23 (×4): 40 meq via ORAL
  Filled 2019-12-20 (×4): qty 2

## 2019-12-20 MED ORDER — NIFEDIPINE ER OSMOTIC RELEASE 30 MG PO TB24
30.0000 mg | ORAL_TABLET | Freq: Once | ORAL | Status: AC
  Administered 2019-12-20: 30 mg via ORAL
  Filled 2019-12-20: qty 1

## 2019-12-20 MED ORDER — NIFEDIPINE ER OSMOTIC RELEASE 90 MG PO TB24
90.0000 mg | ORAL_TABLET | Freq: Every day | ORAL | Status: DC
  Administered 2019-12-21 – 2019-12-23 (×3): 90 mg via ORAL
  Filled 2019-12-20 (×3): qty 1

## 2019-12-20 NOTE — Evaluation (Signed)
Physical Therapy Evaluation Patient Details Name: Dawn Fowler MRN: [id number] DOB: 01/29/1966 Today's Date: 12/20/2019   History of Present Illness  Pt is a 54 yo female presenting with worsening shortness of breath, unable to walk few steps and weight gain of about 10 pound in 2 weeks. PMH includes chronic hypoxic respiratory failure secondary to restrictive lung disease on chronic home O2 (8 L/min baseline), pulmonary hypertension, obstructive sleep apnea on CPAP, stage III chronic kidney disease, obesity, prior h/o PE, and recent hospitalization for COVID-19 pneumonia 09/2019.  Clinical Impression  Pt in bed upon arrival of PT, agreeable to evaluation at this time. The pt presents with limitations in functional mobility and endurance compared to her prior level of function and independence due to above dx. The pt was able to demo good safety and mobility with bed mobility and sit-stand transfers, but had sig limitations in endurance requiring increased O2 (on 5L at rest, initially on 6L with ambulation, but increased to 8L with prolonged ambulation, SpO2 remained above 92%). The pt uses O2 at home and reports 8L is normal for her to use with mobility. Discussed importance of continued endurance training, and pt was agreeable. PT will continue to follow acutely to maintain strength and endurance to facilitate return home. Pt may benefit from HHPT follow-up to further maximize independence and endurance.     Follow Up Recommendations Home health PT    Equipment Recommendations  (pt has needed equipment)    Recommendations for Other Services       Precautions / Restrictions Precautions Precaution Comments: watch O2, pt on 4L at home at rest, uses 8L at home for mobility Restrictions Weight Bearing Restrictions: No      Mobility  Bed Mobility Overal bed mobility: Modified Independent             General bed mobility comments: pt used bed rails and elevated  HOB  Transfers Overall transfer level: Modified independent Equipment used: None             General transfer comment: some assist for O2 management, pt stable and able to move without LOB  Ambulation/Gait Ambulation/Gait assistance: Supervision Gait Distance (Feet): 300 Feet(4 standing rest breaks after ~75 ft each time.) Assistive device: None Gait Pattern/deviations: Step-through pattern;Decreased stride length;Wide base of support Gait velocity: 0.5 m/s Gait velocity interpretation: 1.31 - 2.62 ft/sec, indicative of limited community ambulator General Gait Details: Pt with slow but steady gait, increased lateral movement, no LOB  Stairs            Wheelchair Mobility    Modified Rankin (Stroke Patients Only)       Balance Overall balance assessment: Mild deficits observed, not formally tested                                           Pertinent Vitals/Pain Pain Assessment: No/denies pain    Home Living Family/patient expects to be discharged to:: Private residence Living Arrangements: Alone Available Help at Discharge: Available PRN/intermittently Type of Home: Apartment Home Access: Level entry     Home Layout: One level Home Equipment: Walker - 4 wheels;Cane - single point Additional Comments: pt reports using rollator as needed, 8L of O2 at home    Prior Function Level of Independence: Independent with assistive device(s)         Comments: still driving, assist as needed  Hand Dominance   Dominant Hand: Right    Extremity/Trunk Assessment   Upper Extremity Assessment Upper Extremity Assessment: Overall WFL for tasks assessed    Lower Extremity Assessment Lower Extremity Assessment: Overall WFL for tasks assessed    Cervical / Trunk Assessment Cervical / Trunk Assessment: Normal  Communication   Communication: No difficulties  Cognition Arousal/Alertness: Awake/alert Behavior During Therapy: WFL for tasks  assessed/performed Overall Cognitive Status: Within Functional Limits for tasks assessed                                        General Comments      Exercises     Assessment/Plan    PT Assessment Patient needs continued PT services  PT Problem List Decreased mobility;Decreased strength;Decreased activity tolerance;Cardiopulmonary status limiting activity;Obesity       PT Treatment Interventions Therapeutic exercise;Gait training;Balance training;Functional mobility training;Therapeutic activities;Patient/family education;Other (comment)(endurance training)    PT Goals (Current goals can be found in the Care Plan section)  Acute Rehab PT Goals Patient Stated Goal: return home to her puppy PT Goal Formulation: With patient Time For Goal Achievement: 01/03/20 Potential to Achieve Goals: Good    Frequency Min 3X/week   Barriers to discharge        Co-evaluation               AM-PAC PT "6 Clicks" Mobility  Outcome Measure Help needed turning from your back to your side while in a flat bed without using bedrails?: None Help needed moving from lying on your back to sitting on the side of a flat bed without using bedrails?: None Help needed moving to and from a bed to a chair (including a wheelchair)?: None Help needed standing up from a chair using your arms (e.g., wheelchair or bedside chair)?: None Help needed to walk in hospital room?: None Help needed climbing 3-5 steps with a railing? : A Little 6 Click Score: 23    End of Session Equipment Utilized During Treatment: Gait belt;Oxygen(5L at rest, up to 8L with mobility. this is what the pt uses at home) Activity Tolerance: Patient tolerated treatment well Patient left: with call bell/phone within reach(in bathroom) Nurse Communication: Mobility status(pt left in bathroom, is safe to ambulate in room without assist) PT Visit Diagnosis: Difficulty in walking, not elsewhere classified (R26.2)     Time: 6144-3154 PT Time Calculation (min) (ACUTE ONLY): 20 min   Charges:   PT Evaluation $PT Eval Low Complexity: 1 Low          Karma Ganja, PT, DPT   Acute Rehabilitation Department Pager #: [phone]  Otho Bellows 12/20/2019, 3:47 PM

## 2019-12-20 NOTE — Progress Notes (Signed)
Pt states she does not want to wear CPAP tonight due to her having a panic attack with our style masks.

## 2019-12-20 NOTE — Progress Notes (Signed)
PROGRESS NOTE  Dawn Fowler UJW:[id number] DOB: [birth year] DOA: 12/17/2019 PCP: Hayden Rasmussen, MD  HPI/Recap of past 24 hours: Dawn McKinley54 year old female with a history of chronic hypoxic respiratory failure secondary to restrictive lung disease on chronic home O2 (8 L/min baseline), pulmonary hypertension, obstructive sleep apneaon CPAP,stage III chronic kidney disease,obesity, prior h/o PE,and recent hospitalization for COVID-19 pneumonia 09/2019.According to records, she had avery mild case of Covid and responded very well to IV steroids and remdesivir. She did not require intubation. She was discharged home on November 03, 2019.  She presented with worsening shortness of breath, unable to walk few steps and weight gain of about 10 pound in 2 weeks.  She was started on metolazone recently along with increased dose of Lasix but she came to ED because of her symptoms.  2/20: Seen and examined.  Patient feels better after diuresing.  Heart failure team is following.  Ongoing diuresing.  No evidence of chronic PE on VQ scan.   Assessment/Plan: Active Problems:   Acute respiratory failure with hypoxia (HCC)   Pulmonary hypertension (HCC)   CKD (chronic kidney disease) stage 3, GFR 30-59 ml/min   OSA (obstructive sleep apnea)   Obesity   Exertional dyspnea   Pitting edema  Acute on chronic hypoxic respiratory failure secondary to acute on chronic diastolic CHF with significant RV dysfunction Management according to heart failure team. Ongoing diuresing with improvement of O2 saturation. Personally reviewed chest x-ray which showed pulmonary edema. Continue to maintain O2 saturation greater than 92% Bronchodilators as needed Currently on 5 L with O2 saturation 93%. Will need home O2 evaluation when close to discharge.  Severe pulmonary artery hypertension of unclear etiology No evidence of PE on VQ scan. OSA with noncompliance with CPAP Management per cardiology   Acute on chronic diastolic CHF 2D echo showed preserved LVEF 55 to 60% with moderate RV dilatation/RV dysfunction.  Severe pulmonary hypertension. Ongoing diuresing, continue to monitor blood pressure, renal function, and electrolytes while on diuretics Net I&O -6.7 L Continue strict I's and O's and daily weight  AKI on CKD 3B Baseline creatinine appears to be 2.0 with GFR of 31. Presented with creatinine of 2.3 with GFR of 27. Continue to hold ARB, avoid nephrotoxins and hypotension Continue to closely monitor renal function, urine output and electrolytes while on diuretics Continue daily BMPs  Severe pulmonary hypertension likely related to OSA Managed by heart failure team  Essential hypertension Blood pressure is at goal. Continue antihypertensive as recommended by heart failure team.  OSA Continue CPAP at night  History of COVID-19 viral pneumonia. Per H&P: discharged 11/03/19 which noted treated with steroids and remdesivir. Out of quarantine window.  Obesity BMI 49 Recommend weight loss outpatient with regular physical activity and healthy dieting.    Code Status: Full code  Family Communication: None at bedside  Disposition Plan: Patient is from home.  Anticipate discharge to home when heart failure team signs off.  Barrier to discharge ongoing diuresing.   Consultants:  Advanced heart failure team.  Procedures:  None  Antimicrobials:  None  DVT prophylaxis: Subcu Lovenox daily   Objective: Vitals:   12/19/19 2333 12/20/19 0444 12/20/19 0602 12/20/19 0608  BP: (!) 132/93 (!) 138/96 (!) 149/95 (!) 149/95  Pulse:  90 86   Resp:  20 16   Temp:  98.7 F (37.1 C)    TempSrc:  Oral    SpO2:  93% 93%   Weight:  114.5 kg    Height:  Intake/Output Summary (Last 24 hours) at 12/20/2019 1230 Last data filed at 12/20/2019 1000 Gross per 24 hour  Intake 1331 ml  Output 1700 ml  Net -369 ml   Filed Weights   12/18/19 0336 12/19/19 0506  12/20/19 0444  Weight: 122.9 kg 117.9 kg 114.5 kg    Exam:  . General: 54 y.o. year-old female well-developed well-nourished no acute distress.  Alert and oriented x3.   . Cardiovascular: Regular rate and rhythm no rubs or gallops. Marland Kitchen Respiratory: Mild rales at bases no wheezing noted. . Abdomen: Obese nontender normal bowel sounds present.   . Musculoskeletal: Trace lower extremity edema bilaterally. Marland Kitchen Psychiatry: Mood is appropriate for condition and setting.  Data Reviewed: CBC: Recent Labs  Lab 12/17/19 1655 12/18/19 0319 12/18/19 1234 12/18/19 1241  WBC 5.6 5.8  --   --   NEUTROABS 3.5  --   --   --   HGB 13.8 14.2 15.0 15.6*  HCT 45.3 45.5 44.0 46.0  MCV 101.6* 98.9  --   --   PLT 136* 249  --   --    Basic Metabolic Panel: Recent Labs  Lab 12/18/19 0319 12/18/19 1234 12/18/19 1241 12/18/19 2017 12/19/19 0319 12/20/19 0424 12/20/19 1113  NA 137   < > 139 139 138 QUESTIONABLE RESULTS, RECOMMEND RECOLLECT TO VERIFY 139  K 3.9   < > 3.6 4.5 3.8 QUESTIONABLE RESULTS, RECOMMEND RECOLLECT TO VERIFY 3.7  CL 108  --   --  105 101 QUESTIONABLE RESULTS, RECOMMEND RECOLLECT TO VERIFY 101  CO2 20*  --   --  25 23 QUESTIONABLE RESULTS, RECOMMEND RECOLLECT TO VERIFY 26  GLUCOSE 86  --   --  117* 94 QUESTIONABLE RESULTS, RECOMMEND RECOLLECT TO VERIFY 113*  BUN 38*  --   --  38* 38* QUESTIONABLE RESULTS, RECOMMEND RECOLLECT TO VERIFY 32*  CREATININE 2.18*  --   --  2.33* 2.04* QUESTIONABLE RESULTS, RECOMMEND RECOLLECT TO VERIFY 2.27*  CALCIUM 9.0  --   --  9.4 9.3 QUESTIONABLE RESULTS, RECOMMEND RECOLLECT TO VERIFY 9.0  MG 2.1  --   --  1.8 2.1  --   --   PHOS 4.6  --   --   --   --   --   --    < > = values in this interval not displayed.   GFR: Estimated Creatinine Clearance: 33.7 mL/min (A) (by C-G formula based on SCr of 2.27 mg/dL (H)). Liver Function Tests: Recent Labs  Lab 12/18/19 0319  AST 33  ALT 54*  ALKPHOS 77  BILITOT 0.6  PROT 5.3*  ALBUMIN 3.0*   No  results for input(s): LIPASE, AMYLASE in the last 168 hours. No results for input(s): AMMONIA in the last 168 hours. Coagulation Profile: No results for input(s): INR, PROTIME in the last 168 hours. Cardiac Enzymes: No results for input(s): CKTOTAL, CKMB, CKMBINDEX, TROPONINI in the last 168 hours. BNP (last 3 results) No results for input(s): PROBNP in the last 8760 hours. HbA1C: No results for input(s): HGBA1C in the last 72 hours. CBG: No results for input(s): GLUCAP in the last 168 hours. Lipid Profile: No results for input(s): CHOL, HDL, LDLCALC, TRIG, CHOLHDL, LDLDIRECT in the last 72 hours. Thyroid Function Tests: Recent Labs    12/18/19 0319  TSH 5.125*   Anemia Panel: No results for input(s): VITAMINB12, FOLATE, FERRITIN, TIBC, IRON, RETICCTPCT in the last 72 hours. Urine analysis:    Component Value Date/Time   COLORURINE YELLOW 12/18/2019 0948  APPEARANCEUR CLEAR 12/18/2019 0948   LABSPEC 1.006 12/18/2019 0948   PHURINE 5.0 12/18/2019 0948   GLUCOSEU NEGATIVE 12/18/2019 0948   HGBUR LARGE (A) 12/18/2019 0948   BILIRUBINUR NEGATIVE 12/18/2019 0948   KETONESUR NEGATIVE 12/18/2019 0948   PROTEINUR 100 (A) 12/18/2019 0948   NITRITE NEGATIVE 12/18/2019 0948   LEUKOCYTESUR NEGATIVE 12/18/2019 0948   Sepsis Labs: @LABRCNTIP (procalcitonin:4,lacticidven:4)  ) Recent Results (from the past 240 hour(s))  Respiratory Panel by RT PCR (Flu A&B, Covid) - Nasopharyngeal Swab     Status: None   Collection Time: 12/17/19  7:48 PM   Specimen: Nasopharyngeal Swab  Result Value Ref Range Status   SARS Coronavirus 2 by RT PCR NEGATIVE NEGATIVE Final    Comment: (NOTE) SARS-CoV-2 target nucleic acids are NOT DETECTED. The SARS-CoV-2 RNA is generally detectable in upper respiratoy specimens during the acute phase of infection. The lowest concentration of SARS-CoV-2 viral copies this assay can detect is 131 copies/mL. A negative result does not preclude SARS-Cov-2 infection  and should not be used as the sole basis for treatment or other patient management decisions. A negative result may occur with  improper specimen collection/handling, submission of specimen other than nasopharyngeal swab, presence of viral mutation(s) within the areas targeted by this assay, and inadequate number of viral copies (<131 copies/mL). A negative result must be combined with clinical observations, patient history, and epidemiological information. The expected result is Negative. Fact Sheet for Patients:  PinkCheek.be Fact Sheet for Healthcare Providers:  GravelBags.it This test is not yet ap proved or cleared by the [place] FDA and  has been authorized for detection and/or diagnosis of SARS-CoV-2 by FDA under an Emergency Use Authorization (EUA). This EUA will remain  in effect (meaning this test can be used) for the duration of the COVID-19 declaration under Section 564(b)(1) of the Act, 21 U.S.C. section 360bbb-3(b)(1), unless the authorization is terminated or revoked sooner.    Influenza A by PCR NEGATIVE NEGATIVE Final   Influenza B by PCR NEGATIVE NEGATIVE Final    Comment: (NOTE) The Xpert Xpress SARS-CoV-2/FLU/RSV assay is intended as an aid in  the diagnosis of influenza from Nasopharyngeal swab specimens and  should not be used as a sole basis for treatment. Nasal washings and  aspirates are unacceptable for Xpert Xpress SARS-CoV-2/FLU/RSV  testing. Fact Sheet for Patients: PinkCheek.be Fact Sheet for Healthcare Providers: GravelBags.it This test is not yet approved or cleared by the [place] FDA and  has been authorized for detection and/or diagnosis of SARS-CoV-2 by  FDA under an Emergency Use Authorization (EUA). This EUA will remain  in effect (meaning this test can be used) for the duration of the  Covid-19 declaration under Section  564(b)(1) of the Act, 21  U.S.C. section 360bbb-3(b)(1), unless the authorization is  terminated or revoked. Performed at [facility], [place] [address], [place], [place] [address]       Studies: No results found.  Scheduled Meds: . enoxaparin (LOVENOX) injection  40 mg Subcutaneous Q24H  . fluticasone  2 spray Each Nare Daily  . furosemide  80 mg Intravenous BID  . gabapentin  100 mg Oral TID  . hydrALAZINE  75 mg Oral Q8H  . [START ON 12/21/2019] NIFEdipine  90 mg Oral Daily  . NIFEdipine  30 mg Oral Once  . potassium chloride  40 mEq Oral Daily  . sodium chloride flush  3 mL Intravenous Q12H  . sodium chloride flush  3 mL Intravenous Q12H  Continuous Infusions: . sodium chloride       LOS: 3 days     Kayleen Memos, MD [facility] Pager [phone]  If 7PM-7AM, please contact night-coverage [email or web address] Password [email or web address] 12/20/2019, 12:30 PM

## 2019-12-20 NOTE — Progress Notes (Signed)
Patient ID: Dawn Fowler, female   DOB: 1966-10-05, 54 y.o.   MRN: [id number]     Advanced Heart Failure Rounding Note  PCP-Cardiologist: No primary care provider on file.   Subjective:    Good diuresis again yesterday, weight down 7 lbs (I/Os not accurately followed). Breathing improving.   Echo: EF 55-60%, moderately dilated RV with moderately decreased systolic function.   V/Q scan: No evidence for chronic PE  RHC Procedural Findings: Hemodynamics (mmHg) RA mean 14 RV 87/21 PA 89/33, mean 54 PCWP mean 34 Oxygen saturations: SVC 63% PA 61% AO 94% Cardiac Output (Fick) 5.17  Cardiac Index (Fick) 2.22 PVR 3.87 WU   Objective:   Weight Range: 114.5 kg Body mass index is 47.7 kg/m.   Vital Signs:   Temp:  [97.3 F (36.3 C)-98.7 F (37.1 C)] 98.7 F (37.1 C) (02/20 0444) Pulse Rate:  [80-90] 86 (02/20 0602) Resp:  [16-20] 16 (02/20 0602) BP: (132-149)/(93-99) 149/95 (02/20 0608) SpO2:  [92 %-97 %] 93 % (02/20 0602) Weight:  [114.5 kg] 114.5 kg (02/20 0444) Last BM Date: 12/20/19  Weight change: Filed Weights   12/18/19 0336 12/19/19 0506 12/20/19 0444  Weight: 122.9 kg 117.9 kg 114.5 kg    Intake/Output:   Intake/Output Summary (Last 24 hours) at 12/20/2019 1053 Last data filed at 12/20/2019 1000 Gross per 24 hour  Intake 1571 ml  Output 1700 ml  Net -129 ml      Physical Exam    General: NAD Neck: JVP 12 cm, no thyromegaly or thyroid nodule.  Lungs: Clear to auscultation bilaterally with normal respiratory effort. CV: Nondisplaced PMI.  Heart regular S1/S2, no S3/S4, no murmur.  No peripheral edema.   Abdomen: Soft, nontender, no hepatosplenomegaly, no distention.  Skin: Intact without lesions or rashes.  Neurologic: Alert and oriented x 3.  Psych: Normal affect. Extremities: No clubbing or cyanosis.  HEENT: Normal.    Telemetry   NSR 80s (personally reviewed)   Labs    CBC Recent Labs    12/17/19 1655 12/17/19 1655 12/18/19 0319  12/18/19 0319 12/18/19 1234 12/18/19 1241  WBC 5.6  --  5.8  --   --   --   NEUTROABS 3.5  --   --   --   --   --   HGB 13.8   < > 14.2   < > 15.0 15.6*  HCT 45.3   < > 45.5   < > 44.0 46.0  MCV 101.6*  --  98.9  --   --   --   PLT 136*  --  249  --   --   --    < > = values in this interval not displayed.   Basic Metabolic Panel Recent Labs    12/18/19 0319 12/18/19 1234 12/18/19 2017 12/18/19 2017 12/19/19 0319 12/20/19 0424  NA 137   < > 139   < > 138 139  K 3.9   < > 4.5   < > 3.8 4.2  CL 108  --  105   < > 101 107  CO2 20*  --  25   < > 23 22  GLUCOSE 86  --  117*   < > 94 83  BUN 38*  --  38*   < > 38* 5*  CREATININE 2.18*  --  2.33*   < > 2.04* 0.59  CALCIUM 9.0  --  9.4   < > 9.3 8.8*  MG 2.1  --  1.8  --  2.1  --   PHOS 4.6  --   --   --   --   --    < > = values in this interval not displayed.   Liver Function Tests Recent Labs    12/18/19 0319  AST 33  ALT 54*  ALKPHOS 77  BILITOT 0.6  PROT 5.3*  ALBUMIN 3.0*   No results for input(s): LIPASE, AMYLASE in the last 72 hours. Cardiac Enzymes No results for input(s): CKTOTAL, CKMB, CKMBINDEX, TROPONINI in the last 72 hours.  BNP: BNP (last 3 results) Recent Labs    11/02/19 0020 11/03/19 0445 12/17/19 1655  BNP 29.8 25.1 958.5*    ProBNP (last 3 results) No results for input(s): PROBNP in the last 8760 hours.   D-Dimer Recent Labs    12/18/19 1458  DDIMER 0.91*   Hemoglobin A1C No results for input(s): HGBA1C in the last 72 hours. Fasting Lipid Panel No results for input(s): CHOL, HDL, LDLCALC, TRIG, CHOLHDL, LDLDIRECT in the last 72 hours. Thyroid Function Tests Recent Labs    12/18/19 0319  TSH 5.125*    Other results:   Imaging    NM Pulmonary Perfusion  Result Date: 12/19/2019 CLINICAL DATA:  Short of breath.  Recent cardiac catheterization. EXAM: NUCLEAR MEDICINE PERFUSION LUNG SCAN TECHNIQUE: Perfusion images were obtained in multiple projections after intravenous  injection of radiopharmaceutical. Ventilation scans intentionally deferred if perfusion scan and chest x-ray adequate for interpretation during COVID 19 epidemic. RADIOPHARMACEUTICALS:  1.6 mCi Tc-66m MAA IV COMPARISON:  Chest radiograph 12/19/2019 FINDINGS: No wedge-shaped peripheral perfusion defects within LEFT RIGHT lung to suggest acute pulmonary embolism. Prominent heart shadow noted. IMPRESSION: No evidence acute or chronic pulmonary embolism. Electronically Signed   By: Suzy Bouchard M.D.   On: 12/19/2019 11:26     Medications:     Scheduled Medications: . enoxaparin (LOVENOX) injection  40 mg Subcutaneous Q24H  . fluticasone  2 spray Each Nare Daily  . furosemide  80 mg Intravenous BID  . gabapentin  100 mg Oral TID  . hydrALAZINE  75 mg Oral Q8H  . [START ON 12/21/2019] NIFEdipine  90 mg Oral Daily  . NIFEdipine  30 mg Oral Once  . potassium chloride  40 mEq Oral Daily  . sodium chloride flush  3 mL Intravenous Q12H  . sodium chloride flush  3 mL Intravenous Q12H    Infusions: . sodium chloride      PRN Medications: sodium chloride, acetaminophen **OR** acetaminophen, HYDROcodone-acetaminophen, ipratropium-albuterol, ondansetron **OR** ondansetron (ZOFRAN) IV, sodium chloride, sodium chloride flush    Assessment/Plan   1. Acute on chronic diastolic CHF with significant RV dysfunction: Echo with LV E 55-60%, moderate RV dilation/moderate RV dysfunction.  [place] 2/18 showed elevated left and right heart filling pressures with severe pulmonary hypertension.  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  She diuresed well again, weight down.  Suspect BMET not accurate today.  She is still volume overloaded on exam.  - Continue Lasix at 80 mg IV bid today.  Hopefully with lowering of renal venous pressure, creatinine will continue to improve. Need to re-draw BMET today as I do not think it is accurate.  2.  Pulmonary hypertension: I suspect that this is a mixed picture with group 2 and group 3 PH (related to diastolic LV dysfunction and OHS/OSA).  Cannot fully rule out group 1 PH. She was on Opsumit and tadalafil at home (currently out of them for about 2  wks).  V/Q scan not suggestive of chronic PEs and CT chest in 2019 was not suggestive of ILD. HIV and rheumatologic serologies negative.  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  - For now, will diurese aggressively to try to get filling pressures down.  - Once fully diuresed, would plan for full PFTs as well as repeat high resolution CT chest => hopefully will do the CT tomorrow.  - I suspect that the key to treatment will be maintaining oxygen saturation with home O2 and CPAP.  Weight loss also would be helpful.  - For now, will probably restart her on the Opsumit and tadalafil that she was on at home once she is more diuresed.  3. HTN: BP still mildly elevated.    - Increase nifedipine XL to 90 mg daily.  - Continue hydralazine 75 mg tid.  - Will hold off on telmisartan for now with elevated creatinine.  4. AKI and CKD stage 3: Hold telmisartan.  As above, hopefully will see some improvement with diuresis. BMET today probably inaccurate, re-draw.   Length of Stay: 3  [name], MD  12/20/2019, 10:53 AM  Advanced Heart Failure Team Pager [phone] (M-F; 7a - 4p)  Please contact [facility] for night-coverage after hours (4p -7a ) and weekends on [email or web address]

## 2019-12-21 ENCOUNTER — Inpatient Hospital Stay (HOSPITAL_COMMUNITY): Payer: Medicare Other

## 2019-12-21 DIAGNOSIS — I50810 Right heart failure, unspecified: Secondary | ICD-10-CM

## 2019-12-21 LAB — BASIC METABOLIC PANEL
Anion gap: 12 (ref 5–15)
BUN: 35 mg/dL — ABNORMAL HIGH (ref 6–20)
CO2: 27 mmol/L (ref 22–32)
Calcium: 9.1 mg/dL (ref 8.9–10.3)
Chloride: 99 mmol/L (ref 98–111)
Creatinine, Ser: 2.06 mg/dL — ABNORMAL HIGH (ref 0.44–1.00)
GFR calc Af Amer: 31 mL/min — ABNORMAL LOW (ref >60)
GFR calc non Af Amer: 27 mL/min — ABNORMAL LOW (ref >60)
Glucose, Bld: 128 mg/dL — ABNORMAL HIGH (ref 70–99)
Potassium: 3.8 mmol/L (ref 3.5–5.1)
Sodium: 138 mmol/L (ref 135–145)

## 2019-12-21 MED ORDER — IPRATROPIUM-ALBUTEROL 0.5-2.5 (3) MG/3ML IN SOLN: 3.0000 mL | Freq: Three times a day (TID) | RESPIRATORY_TRACT | Status: DC

## 2019-12-21 MED ORDER — ALUM & MAG HYDROXIDE-SIMETH 200-200-20 MG/5ML PO SUSP
30.0000 mL | Freq: Once | ORAL | Status: AC
  Administered 2019-12-21: 30 mL via ORAL
  Filled 2019-12-21: qty 30

## 2019-12-21 MED ORDER — PANTOPRAZOLE SODIUM 40 MG PO TBEC
40.0000 mg | DELAYED_RELEASE_TABLET | Freq: Every day | ORAL | Status: DC
  Administered 2019-12-21 – 2019-12-23 (×3): 40 mg via ORAL
  Filled 2019-12-21 (×3): qty 1

## 2019-12-21 MED ORDER — IPRATROPIUM BROMIDE 0.02 % IN SOLN: 0.5000 mg | Freq: Three times a day (TID) | RESPIRATORY_TRACT | Status: DC

## 2019-12-21 MED ORDER — HYDRALAZINE HCL 50 MG PO TABS
100.0000 mg | ORAL_TABLET | Freq: Three times a day (TID) | ORAL | Status: DC
  Administered 2019-12-21 – 2019-12-23 (×6): 100 mg via ORAL
  Filled 2019-12-21 (×6): qty 2

## 2019-12-21 MED ORDER — LEVALBUTEROL TARTRATE 45 MCG/ACT IN AERO: 2.0000 | INHALATION_SPRAY | Freq: Three times a day (TID) | RESPIRATORY_TRACT | Status: DC

## 2019-12-21 MED ORDER — ALBUTEROL SULFATE (2.5 MG/3ML) 0.083% IN NEBU: 2.5000 mg | INHALATION_SOLUTION | Freq: Three times a day (TID) | RESPIRATORY_TRACT | Status: DC

## 2019-12-21 MED ORDER — LIDOCAINE VISCOUS HCL 2 % MT SOLN
15.0000 mL | Freq: Once | OROMUCOSAL | Status: AC
  Administered 2019-12-21: 15 mL via ORAL
  Filled 2019-12-21: qty 15

## 2019-12-21 MED ORDER — BENZONATATE 100 MG PO CAPS
100.0000 mg | ORAL_CAPSULE | Freq: Three times a day (TID) | ORAL | Status: AC
  Administered 2019-12-21 – 2019-12-22 (×6): 100 mg via ORAL
  Filled 2019-12-21 (×6): qty 1

## 2019-12-21 MED ORDER — IPRATROPIUM BROMIDE HFA 17 MCG/ACT IN AERS: 2.0000 | INHALATION_SPRAY | Freq: Three times a day (TID) | RESPIRATORY_TRACT | Status: DC

## 2019-12-21 NOTE — Progress Notes (Signed)
Patient ID: Dawn Fowler, female   DOB: 10-Aug-1966, 54 y.o.   MRN: [id number]     Advanced Heart Failure Rounding Note  PCP-Cardiologist: No primary care provider on file.   Subjective:    Good diuresis again yesterday, weight down 3 lbs. Breathing improving but still with cough.   Echo: EF 55-60%, moderately dilated RV with moderately decreased systolic function.   V/Q scan: No evidence for chronic PE  RHC Procedural Findings: Hemodynamics (mmHg) RA mean 14 RV 87/21 PA 89/33, mean 54 PCWP mean 34 Oxygen saturations: SVC 63% PA 61% AO 94% Cardiac Output (Fick) 5.17  Cardiac Index (Fick) 2.22 PVR 3.87 WU   Objective:   Weight Range: 113.4 kg Body mass index is 47.22 kg/m.   Vital Signs:   Temp:  [97.6 F (36.4 C)-98.6 F (37 C)] 98.6 F (37 C) (02/21 0403) Pulse Rate:  [80-107] 80 (02/21 0403) Resp:  [19-20] 19 (02/21 0403) BP: (131-156)/(91-98) 156/98 (02/21 0403) SpO2:  [91 %-96 %] 91 % (02/21 0403) Weight:  [113.4 kg] 113.4 kg (02/21 0737) Last BM Date: 12/20/19  Weight change: Filed Weights   12/20/19 0444 12/21/19 0403 12/21/19 0737  Weight: 114.5 kg 113.4 kg 113.4 kg    Intake/Output:   Intake/Output Summary (Last 24 hours) at 12/21/2019 1045 Last data filed at 12/21/2019 0918 Gross per 24 hour  Intake 753 ml  Output 4700 ml  Net -3947 ml      Physical Exam    General: NAD Neck: JVP 10 cm, no thyromegaly or thyroid nodule.  Lungs: Clear to auscultation bilaterally with normal respiratory effort. CV: Nondisplaced PMI.  Heart regular S1/S2, no S3/S4, no murmur.  No peripheral edema.  No carotid bruit.  Normal pedal pulses.  Abdomen: Soft, nontender, no hepatosplenomegaly, no distention.  Skin: Intact without lesions or rashes.  Neurologic: Alert and oriented x 3.  Psych: Normal affect. Extremities: No clubbing or cyanosis.  HEENT: Normal.    Telemetry   NSR 90s (personally reviewed)   Labs    CBC Recent Labs    12/18/19 1234  12/18/19 1241  HGB 15.0 15.6*  HCT 44.0 02.3   Basic Metabolic Panel Recent Labs    12/18/19 2017 12/18/19 2017 12/19/19 0319 12/20/19 0424 12/20/19 1113 12/21/19 0339  NA 139   < > 138   < > 139 138  K 4.5   < > 3.8   < > 3.7 3.8  CL 105   < > 101   < > 101 99  CO2 25   < > 23   < > 26 27  GLUCOSE 117*   < > 94   < > 113* 128*  BUN 38*   < > 38*   < > 32* 35*  CREATININE 2.33*   < > 2.04*   < > 2.27* 2.06*  CALCIUM 9.4   < > 9.3   < > 9.0 9.1  MG 1.8  --  2.1  --   --   --    < > = values in this interval not displayed.   Liver Function Tests No results for input(s): AST, ALT, ALKPHOS, BILITOT, PROT, ALBUMIN in the last 72 hours. No results for input(s): LIPASE, AMYLASE in the last 72 hours. Cardiac Enzymes No results for input(s): CKTOTAL, CKMB, CKMBINDEX, TROPONINI in the last 72 hours.  BNP: BNP (last 3 results) Recent Labs    11/02/19 0020 11/03/19 0445 12/17/19 1655  BNP 29.8 25.1 958.5*    ProBNP (  last 3 results) No results for input(s): PROBNP in the last 8760 hours.   D-Dimer Recent Labs    12/18/19 1458  DDIMER 0.91*   Hemoglobin A1C No results for input(s): HGBA1C in the last 72 hours. Fasting Lipid Panel No results for input(s): CHOL, HDL, LDLCALC, TRIG, CHOLHDL, LDLDIRECT in the last 72 hours. Thyroid Function Tests No results for input(s): TSH, T4TOTAL, T3FREE, THYROIDAB in the last 72 hours.  Invalid input(s): FREET3  Other results:   Imaging    No results found.   Medications:     Scheduled Medications: . albuterol  2.5 mg Nebulization Q8H  . benzonatate  100 mg Oral TID  . enoxaparin (LOVENOX) injection  40 mg Subcutaneous Q24H  . furosemide  80 mg Intravenous BID  . hydrALAZINE  100 mg Oral Q8H  . ipratropium  0.5 mg Nebulization Q8H  . NIFEdipine  90 mg Oral Daily  . potassium chloride  40 mEq Oral Daily  . sodium chloride flush  3 mL Intravenous Q12H  . sodium chloride flush  3 mL Intravenous Q12H     Infusions: . sodium chloride      PRN Medications: sodium chloride, acetaminophen **OR** acetaminophen, HYDROcodone-acetaminophen, ipratropium-albuterol, ondansetron **OR** ondansetron (ZOFRAN) IV, sodium chloride, sodium chloride flush    Assessment/Plan   1. Acute on chronic diastolic CHF with significant RV dysfunction: Echo with LV E 55-60%, moderate RV dilation/moderate RV dysfunction.  [place] 2/18 showed elevated left and right heart filling pressures with severe pulmonary hypertension.  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  She diuresed well again, weight down.  Creatinine stable about 2.  She is still volume overloaded on exam.  - Continue Lasix at 80 mg IV bid today.  Hopefully with lowering of renal venous pressure, creatinine will continue to remain stable.  2. Pulmonary hypertension: I suspect that this is a mixed picture with group 2 and group 3 PH (related to diastolic LV dysfunction and OHS/OSA).  Cannot fully rule out group 1 PH. She was on Opsumit and tadalafil at home (currently out of them for about 2 wks).  V/Q scan not suggestive of chronic PEs and CT chest in 2019 was not suggestive of ILD. HIV and rheumatologic serologies negative.  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  - For now, will diurese aggressively to try to get filling pressures down.  - Will order full PFTs as well as repeat high resolution CT chest today.  - I suspect that the key to treatment will be maintaining oxygen saturation with home O2 and CPAP. Weight loss also would be helpful.  - For now, will probably restart her on the Opsumit and tadalafil that she was on at home once she is fully diuresed.  3. HTN: BP still mildly elevated.    - Continue nifedipine XL 90 mg daily.  - Increase hydralazine to 100 mg tid.  - Will hold off on telmisartan for now with elevated creatinine.  4. AKI and  CKD stage 3: Hold telmisartan.  Creatinine stable so far with diuresis.    Length of Stay: R[place], MD  12/21/2019, 10:45 AM  Advanced Heart Failure Team Pager [phone] (M-F; 7a - 4p)  Please contact [facility] for night-coverage after hours (4p -7a ) and weekends on [email or web address]

## 2019-12-21 NOTE — Progress Notes (Signed)
Pt 400 mL urine output in hat in commode emptied.

## 2019-12-21 NOTE — Progress Notes (Signed)
PROGRESS NOTE  Dawn Fowler YOV:[id number] DOB: 09-Dec-1965 DOA: 12/17/2019 PCP: Dawn Rasmussen, MD  HPI/Recap of past 24 hours: Dawn McKinley54 year old female with a history of chronic hypoxic respiratory failure secondary to restrictive lung disease on chronic home O2 (8 L/min baseline), pulmonary hypertension, obstructive sleep apneaon CPAP,stage III chronic kidney disease,obesity, prior h/o PE,and recent hospitalization for COVID-19 pneumonia 09/2019.According to records, she had avery mild case of Covid and responded very well to IV steroids and remdesivir. She did not require intubation. She was discharged home on November 03, 2019.  She presented with worsening shortness of breath, unable to walk few steps and weight gain of about 10 pound in 2 weeks.  She was started on metolazone recently along with increased dose of Lasix but she came to ED because of her symptoms.  2/21: Seen and examined.  Concerned that she is not putting out as much urine as she did when she first came in.  Net I&O -10.7 L.  Ongoing diuresing.  Advanced heart failure team directing management.  Also reports she is coughing more.  Started [employer]   Assessment/Plan: Active Problems:   Acute respiratory failure with hypoxia (HCC)   Pulmonary hypertension (HCC)   CKD (chronic kidney disease) stage 3, GFR 30-59 ml/min   OSA (obstructive sleep apnea)   Obesity   Exertional dyspnea   Pitting edema  Acute on chronic hypoxic respiratory failure secondary to acute on chronic diastolic CHF with significant RV dysfunction Management according to heart failure team. Ongoing diuresing with improvement of O2 saturation. Personally reviewed chest x-ray which showed pulmonary edema. Continue to maintain O2 saturation greater than 92% Currently on 5 L with O2 saturation 91%. Start Xopenex inhaler and ipratropium inhaler 2 puffs 3 times daily. Start incentive spirometer and flutter valve. Start  Tessalon Perles for cough 100 mg 3 times daily x2 days.  Severe pulmonary artery hypertension of unclear etiology No evidence of PE on VQ scan. OSA with noncompliance with CPAP Ongoing diuresing her with plan for CT chest once at her dry weight Management per advanced heart failure team. Ongoing work-up.  Acute on chronic diastolic CHF 2D echo showed preserved LVEF 55 to 60% with moderate RV dilatation/RV dysfunction.  Severe pulmonary artery hypertension.  Grade 1 diastolic dysfunction. Ongoing diuresing, continue to monitor blood pressure, renal function, and electrolytes while on diuretics Net I&O -10.7 L Continue IV Lasix 80 mg twice daily, monitor electrolytes and replace as indicated.  Monitor urine output and renal function. Continue strict I's and O's and daily weight  AKI on CKD 3B Baseline creatinine appears to be 2.0 with GFR of 31. Presented with creatinine of 2.3 with GFR of 27. She is back to her baseline creatinine. Continue to hold ARB, avoid nephrotoxins and hypotension Continue to closely monitor renal function, urine output and electrolytes while on diuretics Continue daily BMPs  Essential hypertension Blood pressure is uncontrolled. Continue antihypertensive as recommended by heart failure team.  OSA with noncompliance. Continue CPAP at night  History of COVID-19 viral pneumonia. Per H&P: discharged 11/03/19 which noted treated with steroids and remdesivir. Out of quarantine window.  Obesity BMI 49 Recommend weight loss outpatient with regular physical activity and healthy dieting.    Code Status: Full code  Family Communication: None at bedside  Disposition Plan: Patient is from home.  Anticipate discharge to home when heart failure team signs off.  Barrier to discharge ongoing diuresing and work-up for severe pulmonary artery hypertension.   Consultants:  Advanced  heart failure team.  Procedures:  None  Antimicrobials:  None  DVT  prophylaxis: Subcu Lovenox daily   Objective: Vitals:   12/20/19 2000 12/21/19 0013 12/21/19 0403 12/21/19 0737  BP:   (!) 156/98   Pulse:   80   Resp:   19   Temp:   98.6 F (37 C)   TempSrc:   Oral   SpO2: 93% 94% 91%   Weight:   113.4 kg 113.4 kg  Height:        Intake/Output Summary (Last 24 hours) at 12/21/2019 1019 Last data filed at 12/21/2019 4098 Gross per 24 hour  Intake 753 ml  Output 4700 ml  Net -3947 ml   Filed Weights   12/20/19 0444 12/21/19 0403 12/21/19 0737  Weight: 114.5 kg 113.4 kg 113.4 kg    Exam:  . General: 54 y.o. year-old female pleasant well-developed well-nourished no acute distress.  Alert and oriented x3.   . Cardiovascular: Regular rate and rhythm no rubs or gallops.   Marland Kitchen Respiratory: Mild rales at bases no wheezing noted.  Good inspiratory effort.   . Abdomen: Soft nontender normal bowel sounds present.   . Musculoskeletal: No lower extremity edema bilaterally.   Marland Kitchen Psychiatry: Mood is appropriate for condition and setting.  Data Reviewed: CBC: Recent Labs  Lab 12/17/19 1655 12/18/19 0319 12/18/19 1234 12/18/19 1241  WBC 5.6 5.8  --   --   NEUTROABS 3.5  --   --   --   HGB 13.8 14.2 15.0 15.6*  HCT 45.3 45.5 44.0 46.0  MCV 101.6* 98.9  --   --   PLT 136* 249  --   --    Basic Metabolic Panel: Recent Labs  Lab 12/18/19 0319 12/18/19 1234 12/18/19 2017 12/19/19 0319 12/20/19 0424 12/20/19 1113 12/21/19 0339  NA 137   < > 139 138 QUESTIONABLE RESULTS, RECOMMEND RECOLLECT TO VERIFY 139 138  K 3.9   < > 4.5 3.8 QUESTIONABLE RESULTS, RECOMMEND RECOLLECT TO VERIFY 3.7 3.8  CL 108  --  105 101 QUESTIONABLE RESULTS, RECOMMEND RECOLLECT TO VERIFY 101 99  CO2 20*  --  25 23 QUESTIONABLE RESULTS, RECOMMEND RECOLLECT TO VERIFY 26 27  GLUCOSE 86  --  117* 94 QUESTIONABLE RESULTS, RECOMMEND RECOLLECT TO VERIFY 113* 128*  BUN 38*  --  38* 38* QUESTIONABLE RESULTS, RECOMMEND RECOLLECT TO VERIFY 32* 35*  CREATININE 2.18*  --  2.33*  2.04* QUESTIONABLE RESULTS, RECOMMEND RECOLLECT TO VERIFY 2.27* 2.06*  CALCIUM 9.0  --  9.4 9.3 QUESTIONABLE RESULTS, RECOMMEND RECOLLECT TO VERIFY 9.0 9.1  MG 2.1  --  1.8 2.1  --   --   --   PHOS 4.6  --   --   --   --   --   --    < > = values in this interval not displayed.   GFR: Estimated Creatinine Clearance: 36.9 mL/min (A) (by C-G formula based on SCr of 2.06 mg/dL (H)). Liver Function Tests: Recent Labs  Lab 12/18/19 0319  AST 33  ALT 54*  ALKPHOS 77  BILITOT 0.6  PROT 5.3*  ALBUMIN 3.0*   No results for input(s): LIPASE, AMYLASE in the last 168 hours. No results for input(s): AMMONIA in the last 168 hours. Coagulation Profile: No results for input(s): INR, PROTIME in the last 168 hours. Cardiac Enzymes: No results for input(s): CKTOTAL, CKMB, CKMBINDEX, TROPONINI in the last 168 hours. BNP (last 3 results) No results for input(s): PROBNP in the last  8760 hours. HbA1C: No results for input(s): HGBA1C in the last 72 hours. CBG: No results for input(s): GLUCAP in the last 168 hours. Lipid Profile: No results for input(s): CHOL, HDL, LDLCALC, TRIG, CHOLHDL, LDLDIRECT in the last 72 hours. Thyroid Function Tests: No results for input(s): TSH, T4TOTAL, FREET4, T3FREE, THYROIDAB in the last 72 hours. Anemia Panel: No results for input(s): VITAMINB12, FOLATE, FERRITIN, TIBC, IRON, RETICCTPCT in the last 72 hours. Urine analysis:    Component Value Date/Time   COLORURINE YELLOW 12/18/2019 [place] 12/18/2019 0948   LABSPEC 1.006 12/18/2019 0948   PHURINE 5.0 12/18/2019 0948   GLUCOSEU NEGATIVE 12/18/2019 0948   HGBUR LARGE (A) 12/18/2019 0948   BILIRUBINUR NEGATIVE 12/18/2019 0948   KETONESUR NEGATIVE 12/18/2019 0948   PROTEINUR 100 (A) 12/18/2019 0948   NITRITE NEGATIVE 12/18/2019 0948   LEUKOCYTESUR NEGATIVE 12/18/2019 0948   Sepsis Labs: @LABRCNTIP (procalcitonin:4,lacticidven:4)  ) Recent Results (from the past 240 hour(s))  Respiratory  Panel by RT PCR (Flu A&B, Covid) - Nasopharyngeal Swab     Status: None   Collection Time: 12/17/19  7:48 PM   Specimen: Nasopharyngeal Swab  Result Value Ref Range Status   SARS Coronavirus 2 by RT PCR NEGATIVE NEGATIVE Final    Comment: (NOTE) SARS-CoV-2 target nucleic acids are NOT DETECTED. The SARS-CoV-2 RNA is generally detectable in upper respiratoy specimens during the acute phase of infection. The lowest concentration of SARS-CoV-2 viral copies this assay can detect is 131 copies/mL. A negative result does not preclude SARS-Cov-2 infection and should not be used as the sole basis for treatment or other patient management decisions. A negative result may occur with  improper specimen collection/handling, submission of specimen other than nasopharyngeal swab, presence of viral mutation(s) within the areas targeted by this assay, and inadequate number of viral copies (<131 copies/mL). A negative result must be combined with clinical observations, patient history, and epidemiological information. The expected result is Negative. Fact Sheet for Patients:  PinkCheek.be Fact Sheet for Healthcare Providers:  GravelBags.it This test is not yet ap proved or cleared by the [place] FDA and  has been authorized for detection and/or diagnosis of SARS-CoV-2 by FDA under an Emergency Use Authorization (EUA). This EUA will remain  in effect (meaning this test can be used) for the duration of the COVID-19 declaration under Section 564(b)(1) of the Act, 21 U.S.C. section 360bbb-3(b)(1), unless the authorization is terminated or revoked sooner.    Influenza A by PCR NEGATIVE NEGATIVE Final   Influenza B by PCR NEGATIVE NEGATIVE Final    Comment: (NOTE) The Xpert Xpress SARS-CoV-2/FLU/RSV assay is intended as an aid in  the diagnosis of influenza from Nasopharyngeal swab specimens and  should not be used as a sole basis for  treatment. Nasal washings and  aspirates are unacceptable for Xpert Xpress SARS-CoV-2/FLU/RSV  testing. Fact Sheet for Patients: PinkCheek.be Fact Sheet for Healthcare Providers: GravelBags.it This test is not yet approved or cleared by the [place] FDA and  has been authorized for detection and/or diagnosis of SARS-CoV-2 by  FDA under an Emergency Use Authorization (EUA). This EUA will remain  in effect (meaning this test can be used) for the duration of the  Covid-19 declaration under Section 564(b)(1) of the Act, 21  U.S.C. section 360bbb-3(b)(1), unless the authorization is  terminated or revoked. Performed at [facility], [place] [address] [place], [place] [address]       Studies: No results found.  Scheduled Meds: .  enoxaparin (LOVENOX) injection  40 mg Subcutaneous Q24H  . furosemide  80 mg Intravenous BID  . hydrALAZINE  75 mg Oral Q8H  . ipratropium  2 puff Inhalation Q8H  . levalbuterol  2 puff Inhalation Q8H  . NIFEdipine  90 mg Oral Daily  . potassium chloride  40 mEq Oral Daily  . sodium chloride flush  3 mL Intravenous Q12H  . sodium chloride flush  3 mL Intravenous Q12H    Continuous Infusions: . sodium chloride       LOS: 4 days     Kayleen Memos, MD [facility] Pager [phone]  If 7PM-7AM, please contact night-coverage [email or web address] Password [facility] 12/21/2019, 10:19 AM

## 2019-12-21 NOTE — Evaluation (Signed)
Occupational Therapy Evaluation Patient Details Name: Dawn Fowler MRN: [id number] DOB: 1966-05-07 Today's Date: 12/21/2019    History of Present Illness Pt is a 54 yo female presenting with worsening shortness of breath, unable to walk few steps and weight gain of about 10 pound in 2 weeks. PMH includes chronic hypoxic respiratory failure secondary to restrictive lung disease on chronic home O2 (8 L/min baseline), pulmonary hypertension, obstructive sleep apnea on CPAP, stage III chronic kidney disease, obesity, prior h/o PE, and recent hospitalization for COVID-19 pneumonia 09/2019.   Clinical Impression   PTA pt independent for BADL, with occasional use of rollator for functional mobility. Pt uses 4L O2 resting at home and 8L O2 for mobility. Pt is able to complete bed mobility and transfers at mod I level without notable loss of balance. Pt completed functional mobility beyond household distance with one rest break. Pt self initiated rest break and pursed lip breathing, as well as monitoring O2 sats without OT cues. No further OT is indicated at this time, or f/u OT. OT will sign off, thank you for this referral.     Follow Up Recommendations  No OT follow up    Equipment Recommendations  None recommended by OT    Recommendations for Other Services       Precautions / Restrictions Precautions Precautions: Other (comment) Precaution Comments: watch O2, pt on 4L at home at rest, uses 8L at home for mobility Restrictions Weight Bearing Restrictions: No      Mobility Bed Mobility Overal bed mobility: Modified Independent                Transfers Overall transfer level: Modified independent                    Balance Overall balance assessment: Mild deficits observed, not formally tested                                         ADL either performed or assessed with clinical judgement   ADL Overall ADL's : At baseline                                        General ADL Comments: Pt is at baseline level for BADL. She was able to compelte toilet transfer and standing grooming tasks. Pt is educated on pursed lip breathing and pacing strategies. Could self pace with functional mobility while self monitoring O2 sats     Vision Patient Visual Report: No change from baseline       Perception     Praxis      Pertinent Vitals/Pain Pain Assessment: No/denies pain     Hand Dominance     Extremity/Trunk Assessment Upper Extremity Assessment Upper Extremity Assessment: Overall WFL for tasks assessed   Lower Extremity Assessment Lower Extremity Assessment: Overall WFL for tasks assessed       Communication Communication Communication: No difficulties   Cognition Arousal/Alertness: Awake/alert Behavior During Therapy: WFL for tasks assessed/performed Overall Cognitive Status: Within Functional Limits for tasks assessed                                     General Comments       Exercises  Shoulder Instructions      Home Living Family/patient expects to be discharged to:: Private residence Living Arrangements: Alone Available Help at Discharge: Available PRN/intermittently Type of Home: Apartment Home Access: Level entry     Home Layout: One level     Bathroom Shower/Tub: Teacher, early years/pre: Standard Bathroom Accessibility: Yes How Accessible: Accessible via walker Home Equipment: [place] - 4 wheels;Cane - single point   Additional Comments: pt reports using rollator as needed, 8L of O2 at home      Prior Functioning/Environment Level of Independence: Independent with assistive device(s)        Comments: still driving, assist as needed        OT Problem List: Cardiopulmonary status limiting activity;Decreased activity tolerance      OT Treatment/Interventions:      OT Goals(Current goals can be found in the care plan section) Acute Rehab OT  Goals Patient Stated Goal: return home to her puppy OT Goal Formulation: With patient Time For Goal Achievement: 01/04/20 Potential to Achieve Goals: Good  OT Frequency:     Barriers to D/C:            Co-evaluation              AM-PAC OT "6 Clicks" Daily Activity     Outcome Measure Help from another person eating meals?: None Help from another person taking care of personal grooming?: None Help from another person toileting, which includes using toliet, bedpan, or urinal?: None Help from another person bathing (including washing, rinsing, drying)?: None Help from another person to put on and taking off regular upper body clothing?: None Help from another person to put on and taking off regular lower body clothing?: None 6 Click Score: 24   End of Session Equipment Utilized During Treatment: Oxygen Nurse Communication: Mobility status  Activity Tolerance: Patient tolerated treatment well Patient left: in bed;with call bell/phone within reach  OT Visit Diagnosis: Other abnormalities of gait and mobility (R26.89)                Time: 8453-6468 OT Time Calculation (min): 17 min Charges:  OT General Charges $OT Visit: 1 Visit OT Evaluation $OT Eval Low Complexity: 1 Low  Zenovia Jarred, MSOT, OTR/L Acute Rehabilitation Services [facility] Office Number: [phone]  Zenovia Jarred 12/21/2019, 4:48 PM

## 2019-12-22 ENCOUNTER — Inpatient Hospital Stay (HOSPITAL_COMMUNITY): Payer: Medicare Other

## 2019-12-22 LAB — PULMONARY FUNCTION TEST
DL/VA % pred: 66 %
DL/VA: 2.81 ml/min/mmHg/L
DLCO cor % pred: 31 %
DLCO cor: 6.84 ml/min/mmHg
DLCO unc % pred: 32 %
DLCO unc: 7 ml/min/mmHg
FEF 25-75 Post: 3.15 L/sec
FEF 25-75 Pre: 1.48 L/sec
FEF2575-%Change-Post: 112 %
FEF2575-%Pred-Post: 130 %
FEF2575-%Pred-Pre: 61 %
FEV1-%Change-Post: 17 %
FEV1-%Pred-Post: 54 %
FEV1-%Pred-Pre: 46 %
FEV1-Post: 1.27 L
FEV1-Pre: 1.09 L
FEV1FVC-%Change-Post: 2 %
FEV1FVC-%Pred-Pre: 107 %
FEV6-%Change-Post: 13 %
FEV6-%Pred-Post: 49 %
FEV6-%Pred-Pre: 43 %
FEV6-Post: 1.42 L
FEV6-Pre: 1.25 L
FEV6FVC-%Change-Post: 0 %
FEV6FVC-%Pred-Post: 102 %
FEV6FVC-%Pred-Pre: 102 %
FVC-%Change-Post: 14 %
FVC-%Pred-Post: 48 %
FVC-%Pred-Pre: 42 %
FVC-Post: 1.43 L
FVC-Pre: 1.25 L
Post FEV1/FVC ratio: 89 %
Post FEV6/FVC ratio: 100 %
Pre FEV1/FVC ratio: 87 %
Pre FEV6/FVC Ratio: 100 %
RV % pred: 99 %
RV: 1.89 L
TLC % pred: 64 %
TLC: 3.35 L

## 2019-12-22 LAB — BASIC METABOLIC PANEL
Anion gap: 13 (ref 5–15)
BUN: 33 mg/dL — ABNORMAL HIGH (ref 6–20)
CO2: 26 mmol/L (ref 22–32)
Calcium: 9.2 mg/dL (ref 8.9–10.3)
Chloride: 98 mmol/L (ref 98–111)
Creatinine, Ser: 2.13 mg/dL — ABNORMAL HIGH (ref 0.44–1.00)
GFR calc Af Amer: 30 mL/min — ABNORMAL LOW (ref >60)
GFR calc non Af Amer: 26 mL/min — ABNORMAL LOW (ref >60)
Glucose, Bld: 109 mg/dL — ABNORMAL HIGH (ref 70–99)
Potassium: 4 mmol/L (ref 3.5–5.1)
Sodium: 137 mmol/L (ref 135–145)

## 2019-12-22 MED ORDER — TORSEMIDE 20 MG PO TABS
40.0000 mg | ORAL_TABLET | Freq: Every day | ORAL | Status: DC
  Administered 2019-12-23: 11:00:00 40 mg via ORAL
  Filled 2019-12-22: qty 2

## 2019-12-22 MED ORDER — ALBUTEROL SULFATE (2.5 MG/3ML) 0.083% IN NEBU
2.5000 mg | INHALATION_SOLUTION | Freq: Once | RESPIRATORY_TRACT | Status: AC
  Administered 2019-12-22: 2.5 mg via RESPIRATORY_TRACT

## 2019-12-22 MED ORDER — TORSEMIDE 20 MG PO TABS
20.0000 mg | ORAL_TABLET | Freq: Every evening | ORAL | Status: DC
  Administered 2019-12-22: 18:00:00 20 mg via ORAL
  Filled 2019-12-22: qty 1

## 2019-12-22 MED ORDER — MACITENTAN 10 MG PO TABS
10.0000 mg | ORAL_TABLET | Freq: Every day | ORAL | Status: DC
  Administered 2019-12-22 – 2019-12-23 (×2): 10 mg via ORAL
  Filled 2019-12-22 (×3): qty 1

## 2019-12-22 MED ORDER — TADALAFIL 20 MG PO TABS
20.0000 mg | ORAL_TABLET | Freq: Every day | ORAL | Status: DC
  Administered 2019-12-22 – 2019-12-23 (×2): 20 mg via ORAL
  Filled 2019-12-22 (×3): qty 1

## 2019-12-22 NOTE — Plan of Care (Signed)

## 2019-12-22 NOTE — Progress Notes (Signed)
The chaplain visited as a result of rounding. The patient was very appreciative of the chaplains visit. The chaplain will visit tomorrow.  Brion Aliment Chaplain Resident For questions concerning this note please contact me by pager [phone]

## 2019-12-22 NOTE — Plan of Care (Signed)

## 2019-12-22 NOTE — Progress Notes (Signed)
PROGRESS NOTE  Dawn Fowler:[id number] DOB: 09/17/1966 DOA: 12/17/2019 PCP: Hayden Rasmussen, MD  HPI/Recap of past 24 hours: Dawn McKinley54 year old female with a history of chronic hypoxic respiratory failure secondary to restrictive lung disease on chronic home O2 (8 L/min baseline), pulmonary hypertension, obstructive sleep apneaon CPAP,stage III chronic kidney disease,obesity, prior h/o PE,and recent hospitalization for COVID-19 pneumonia 09/2019.According to records, she had avery mild case of Covid and responded very well to IV steroids and remdesivir. She did not require intubation. She was discharged home on November 03, 2019.  She presented with worsening shortness of breath, unable to walk few steps and weight gain of about 10 pound in 2 weeks.  She was started on metolazone recently along with increased dose of Lasix but she came to ED because of her symptoms.  2/22: Seen and examined.  Left brachial irritation at vascular site. Improved with cold application.  Will apply ice pack. Post PFT, results are pending.  Management of PAH directed by cardiology.  Net I&O -14.8L.    Assessment/Plan: Active Problems:   Acute respiratory failure with hypoxia (HCC)   Pulmonary hypertension (HCC)   CKD (chronic kidney disease) stage 3, GFR 30-59 ml/min   OSA (obstructive sleep apnea)   Obesity   Exertional dyspnea   Pitting edema   RVF (right ventricular failure) (HCC)  Acute on chronic hypoxic respiratory failure secondary to acute on chronic diastolic CHF with significant RV dysfunction Management according to heart failure team. Ongoing diuresing with improvement of O2 saturation. Personally reviewed chest x-ray which showed pulmonary edema. Continue to maintain O2 saturation greater than 92% Currently on 5 L with O2 saturation 91%. Continue Xopenex inhaler and ipratropium inhaler 2 puffs 3 times daily. Continue incentive spirometer and flutter valve. Continue  Tessalon Perles for cough 100 mg 3 times daily  Home O2 evaluation for dc planning  Pulmonary artery hypertension of unclear etiology No evidence of PE on VQ scan. OSA with noncompliance with CPAP CT chest no evidence of ILD.  Emphysema reported with concern for small vessel disease Post PFT, awaiting results. Management per advanced heart failure team. Started on Cialis and Opsumit 2/22  Acute on chronic diastolic CHF 2D echo showed preserved LVEF 55 to 60% with moderate RV dilatation/RV dysfunction.  Severe pulmonary artery hypertension.  Grade 1 diastolic dysfunction. Ongoing diuresing, continue to monitor blood pressure, renal function, and electrolytes while on diuretics Net I&O  -14.8L Medications per cardiology Continue strict I's and O's and daily weight  AKI on CKD 3B Baseline creatinine appears to be 2.0 with GFR of 31. Presented with creatinine of 2.3 with GFR of 27. She is back to her baseline creatinine. Continue to hold ARB, avoid nephrotoxins and hypotension Continue to closely monitor renal function, urine output and electrolytes while on diuretics Continue daily BMPs  Essential hypertension Blood pressure is uncontrolled. Continue antihypertensive as recommended by heart failure team.  OSA with noncompliance. Continue CPAP at night  History of COVID-19 viral pneumonia. Per H&P: discharged 11/03/19 which noted treated with steroids and remdesivir. Out of quarantine window.  Obesity BMI 49 Recommend weight loss outpatient with regular physical activity and healthy dieting.    Code Status: Full code  Family Communication: None at bedside  Disposition Plan: Patient is from home.  Anticipate discharge to home when heart failure team signs off.  Barrier to discharge ongoing diuresing and work-up for severe pulmonary artery hypertension.   Consultants:  Advanced heart failure team.  Procedures:  PFT  Antimicrobials:  None  DVT prophylaxis: Subcu  Lovenox daily   Objective: Vitals:   12/21/19 1353 12/21/19 2044 12/22/19 0342 12/22/19 0900  BP: (!) 131/94 (!) 162/103 (!) 148/111 (!) 143/94  Pulse:  (!) 108 88 93  Resp:  18 20 20   Temp:  97.7 F (36.5 C) 97.8 F (36.6 C) 97.7 F (36.5 C)  TempSrc:  Oral Oral Oral  SpO2:  90% 93% 92%  Weight:   112.1 kg   Height:        Intake/Output Summary (Last 24 hours) at 12/22/2019 1348 Last data filed at 12/22/2019 1107 Gross per 24 hour  Intake 1062 ml  Output 3300 ml  Net -2238 ml   Filed Weights   12/21/19 0403 12/21/19 0737 12/22/19 0342  Weight: 113.4 kg 113.4 kg 112.1 kg    Exam:  . General: 54 y.o. year-old female pleasant well-developed well-nourished no acute distress.  Alert and oriented x3.   . Cardiovascular: Regular rate and rhythm no rubs or gallops. Marland Kitchen Respiratory: Clear to auscultation no wheezes no rales noted. . Abdomen: Soft nontender normal bowel sounds present . Musculoskeletal: No lower extremity edema bilaterally.   Marland Kitchen Psychiatry: Mood is appropriate for condition and setting.   Data Reviewed: CBC: Recent Labs  Lab 12/17/19 1655 12/18/19 0319 12/18/19 1234 12/18/19 1241  WBC 5.6 5.8  --   --   NEUTROABS 3.5  --   --   --   HGB 13.8 14.2 15.0 15.6*  HCT 45.3 45.5 44.0 46.0  MCV 101.6* 98.9  --   --   PLT 136* 249  --   --    Basic Metabolic Panel: Recent Labs  Lab 12/18/19 0319 12/18/19 1234 12/18/19 2017 12/18/19 2017 12/19/19 0319 12/20/19 0424 12/20/19 1113 12/21/19 0339 12/22/19 0334  NA 137   < > 139   < > 138 QUESTIONABLE RESULTS, RECOMMEND RECOLLECT TO VERIFY 139 138 137  K 3.9   < > 4.5   < > 3.8 QUESTIONABLE RESULTS, RECOMMEND RECOLLECT TO VERIFY 3.7 3.8 4.0  CL 108  --  105   < > 101 QUESTIONABLE RESULTS, RECOMMEND RECOLLECT TO VERIFY 101 99 98  CO2 20*  --  25   < > 23 QUESTIONABLE RESULTS, RECOMMEND RECOLLECT TO VERIFY 26 27 26   GLUCOSE 86  --  117*   < > 94 QUESTIONABLE RESULTS, RECOMMEND RECOLLECT TO VERIFY 113* 128*  109*  BUN 38*  --  38*   < > 38* QUESTIONABLE RESULTS, RECOMMEND RECOLLECT TO VERIFY 32* 35* 33*  CREATININE 2.18*  --  2.33*   < > 2.04* QUESTIONABLE RESULTS, RECOMMEND RECOLLECT TO VERIFY 2.27* 2.06* 2.13*  CALCIUM 9.0  --  9.4   < > 9.3 QUESTIONABLE RESULTS, RECOMMEND RECOLLECT TO VERIFY 9.0 9.1 9.2  MG 2.1  --  1.8  --  2.1  --   --   --   --   PHOS 4.6  --   --   --   --   --   --   --   --    < > = values in this interval not displayed.   GFR: Estimated Creatinine Clearance: 35.4 mL/min (A) (by C-G formula based on SCr of 2.13 mg/dL (H)). Liver Function Tests: Recent Labs  Lab 12/18/19 0319  AST 33  ALT 54*  ALKPHOS 77  BILITOT 0.6  PROT 5.3*  ALBUMIN 3.0*   No results for input(s): LIPASE, AMYLASE in the last 168 hours. No  results for input(s): AMMONIA in the last 168 hours. Coagulation Profile: No results for input(s): INR, PROTIME in the last 168 hours. Cardiac Enzymes: No results for input(s): CKTOTAL, CKMB, CKMBINDEX, TROPONINI in the last 168 hours. BNP (last 3 results) No results for input(s): PROBNP in the last 8760 hours. HbA1C: No results for input(s): HGBA1C in the last 72 hours. CBG: No results for input(s): GLUCAP in the last 168 hours. Lipid Profile: No results for input(s): CHOL, HDL, LDLCALC, TRIG, CHOLHDL, LDLDIRECT in the last 72 hours. Thyroid Function Tests: No results for input(s): TSH, T4TOTAL, FREET4, T3FREE, THYROIDAB in the last 72 hours. Anemia Panel: No results for input(s): VITAMINB12, FOLATE, FERRITIN, TIBC, IRON, RETICCTPCT in the last 72 hours. Urine analysis:    Component Value Date/Time   COLORURINE YELLOW 12/18/2019 [place] 12/18/2019 0948   LABSPEC 1.006 12/18/2019 0948   PHURINE 5.0 12/18/2019 0948   GLUCOSEU NEGATIVE 12/18/2019 0948   HGBUR LARGE (A) 12/18/2019 0948   BILIRUBINUR NEGATIVE 12/18/2019 0948   KETONESUR NEGATIVE 12/18/2019 0948   PROTEINUR 100 (A) 12/18/2019 0948   NITRITE NEGATIVE 12/18/2019  0948   LEUKOCYTESUR NEGATIVE 12/18/2019 0948   Sepsis Labs: @LABRCNTIP (procalcitonin:4,lacticidven:4)  ) Recent Results (from the past 240 hour(s))  Respiratory Panel by RT PCR (Flu A&B, Covid) - Nasopharyngeal Swab     Status: None   Collection Time: 12/17/19  7:48 PM   Specimen: Nasopharyngeal Swab  Result Value Ref Range Status   SARS Coronavirus 2 by RT PCR NEGATIVE NEGATIVE Final    Comment: (NOTE) SARS-CoV-2 target nucleic acids are NOT DETECTED. The SARS-CoV-2 RNA is generally detectable in upper respiratoy specimens during the acute phase of infection. The lowest concentration of SARS-CoV-2 viral copies this assay can detect is 131 copies/mL. A negative result does not preclude SARS-Cov-2 infection and should not be used as the sole basis for treatment or other patient management decisions. A negative result may occur with  improper specimen collection/handling, submission of specimen other than nasopharyngeal swab, presence of viral mutation(s) within the areas targeted by this assay, and inadequate number of viral copies (<131 copies/mL). A negative result must be combined with clinical observations, patient history, and epidemiological information. The expected result is Negative. Fact Sheet for Patients:  PinkCheek.be Fact Sheet for Healthcare Providers:  GravelBags.it This test is not yet ap proved or cleared by the [place] FDA and  has been authorized for detection and/or diagnosis of SARS-CoV-2 by FDA under an Emergency Use Authorization (EUA). This EUA will remain  in effect (meaning this test can be used) for the duration of the COVID-19 declaration under Section 564(b)(1) of the Act, 21 U.S.C. section 360bbb-3(b)(1), unless the authorization is terminated or revoked sooner.    Influenza A by PCR NEGATIVE NEGATIVE Final   Influenza B by PCR NEGATIVE NEGATIVE Final    Comment: (NOTE) The Xpert  Xpress SARS-CoV-2/FLU/RSV assay is intended as an aid in  the diagnosis of influenza from Nasopharyngeal swab specimens and  should not be used as a sole basis for treatment. Nasal washings and  aspirates are unacceptable for Xpert Xpress SARS-CoV-2/FLU/RSV  testing. Fact Sheet for Patients: PinkCheek.be Fact Sheet for Healthcare Providers: GravelBags.it This test is not yet approved or cleared by the [place] FDA and  has been authorized for detection and/or diagnosis of SARS-CoV-2 by  FDA under an Emergency Use Authorization (EUA). This EUA will remain  in effect (meaning this test can be used) for the duration of the  Covid-19 declaration under Section 564(b)(1) of the Act, 21  U.S.C. section 360bbb-3(b)(1), unless the authorization is  terminated or revoked. Performed at [facility], [place] [address], [place], [place] [address]       Studies: No results found.  Scheduled Meds: . benzonatate  100 mg Oral TID  . enoxaparin (LOVENOX) injection  40 mg Subcutaneous Q24H  . hydrALAZINE  100 mg Oral Q8H  . macitentan  10 mg Oral Daily  . NIFEdipine  90 mg Oral Daily  . pantoprazole  40 mg Oral Daily  . potassium chloride  40 mEq Oral Daily  . sodium chloride flush  3 mL Intravenous Q12H  . sodium chloride flush  3 mL Intravenous Q12H  . tadalafil  20 mg Oral Daily  . torsemide  20 mg Oral QPM  . [START ON 12/23/2019] torsemide  40 mg Oral Q breakfast    Continuous Infusions: . sodium chloride       LOS: 5 days     Kayleen Memos, MD [facility] Pager [phone]  If 7PM-7AM, please contact night-coverage [email or web address] Password [email or web address] 12/22/2019, 1:48 PM

## 2019-12-22 NOTE — Progress Notes (Addendum)
Patient ID: Dawn Fowler, female   DOB: 02/15/1966, 54 y.o.   MRN: [id number]     Advanced Heart Failure Rounding Note  PCP-Cardiologist: No primary care provider on file.   Subjective:    Yesterday diuresed with IV lasix. Brisk diuresis noted. Weight down another 2 pounds. Overall weight down 30 pounds.  High Resolution CT   1. No findings to suggest interstitial lung disease. 2. There is a spectrum of findings concerning for small vessel disease in the lungs, as detailed above. 3. Dilatation of the pulmonic trunk (4.2 cm in diameter), concerning for pulmonary arterial hypertension start that compatible with reported clinical history of pulmonary arterial hypertension. 4. Hepatic steatosis.  Echo: EF 55-60%, moderately dilated RV with moderately decreased systolic function.   V/Q scan: No evidence for chronic PE  RHC Procedural Findings: Hemodynamics (mmHg) RA mean 14 RV 87/21 PA 89/33, mean 54 PCWP mean 34 Oxygen saturations: SVC 63% PA 61% AO 94% Cardiac Output (Fick) 5.17  Cardiac Index (Fick) 2.22 PVR 3.87 WU   Objective:   Weight Range: 112.1 kg Body mass index is 46.69 kg/m.   Vital Signs:   Temp:  [97.7 F (36.5 C)-97.8 F (36.6 C)] 97.8 F (36.6 C) (02/22 0342) Pulse Rate:  [88-108] 88 (02/22 0342) Resp:  [18-20] 20 (02/22 0342) BP: (131-162)/(94-111) 148/111 (02/22 0342) SpO2:  [90 %-93 %] 93 % (02/22 0342) Weight:  [112.1 kg] 112.1 kg (02/22 0342) Last BM Date: 12/20/19  Weight change: Filed Weights   12/21/19 0403 12/21/19 0737 12/22/19 0342  Weight: 113.4 kg 113.4 kg 112.1 kg    Intake/Output:   Intake/Output Summary (Last 24 hours) at 12/22/2019 0752 Last data filed at 12/22/2019 0700 Gross per 24 hour  Intake 843 ml  Output 4600 ml  Net -3757 ml      Physical Exam   General:   No resp difficulty HEENT: normal Neck: supple. no JVD. Carotids 2+ bilat; no bruits. No lymphadenopathy or thryomegaly appreciated. Cor: PMI  nondisplaced. Regular rate & rhythm. No rubs, gallops or murmurs. Lungs: clear Abdomen: soft, nontender, nondistended. No hepatosplenomegaly. No bruits or masses. Good bowel sounds. Extremities: no cyanosis, clubbing, rash, edema Neuro: alert & orientedx3, cranial nerves grossly intact. moves all 4 extremities w/o difficulty. Affect pleasant   Telemetry   NSR 80-90s    Labs    CBC No results for input(s): WBC, NEUTROABS, HGB, HCT, MCV, PLT in the last 72 hours. Basic Metabolic Panel Recent Labs    12/21/19 0339 12/22/19 0334  NA 138 137  K 3.8 4.0  CL 99 98  CO2 27 26  GLUCOSE 128* 109*  BUN 35* 33*  CREATININE 2.06* 2.13*  CALCIUM 9.1 9.2   Liver Function Tests No results for input(s): AST, ALT, ALKPHOS, BILITOT, PROT, ALBUMIN in the last 72 hours. No results for input(s): LIPASE, AMYLASE in the last 72 hours. Cardiac Enzymes No results for input(s): CKTOTAL, CKMB, CKMBINDEX, TROPONINI in the last 72 hours.  BNP: BNP (last 3 results) Recent Labs    11/02/19 0020 11/03/19 0445 12/17/19 1655  BNP 29.8 25.1 958.5*    ProBNP (last 3 results) No results for input(s): PROBNP in the last 8760 hours.   D-Dimer No results for input(s): DDIMER in the last 72 hours. Hemoglobin A1C No results for input(s): HGBA1C in the last 72 hours. Fasting Lipid Panel No results for input(s): CHOL, HDL, LDLCALC, TRIG, CHOLHDL, LDLDIRECT in the last 72 hours. Thyroid Function Tests No results for input(s): TSH, T4TOTAL,  T3FREE, THYROIDAB in the last 72 hours.  Invalid input(s): FREET3  Other results:   Imaging    CT Chest High Resolution  Result Date: 12/21/2019 CLINICAL DATA:  54 year old female with history of pulmonary arterial hypertension. EXAM: CT CHEST WITHOUT CONTRAST TECHNIQUE: Multidetector CT imaging of the chest was performed following the standard protocol without intravenous contrast. High resolution imaging of the lungs, as well as inspiratory and expiratory  imaging, was performed. COMPARISON:  No priors. FINDINGS: Cardiovascular: Heart size is borderline enlarged. There is no significant pericardial fluid, thickening or pericardial calcification. No atherosclerotic calcifications in the thoracic aorta or the coronary arteries. Dilatation of the pulmonic trunk (4.2 cm in diameter). Mediastinum/Nodes: No pathologically enlarged mediastinal or hilar lymph nodes. Please note that accurate exclusion of hilar adenopathy is limited on noncontrast CT scans. Esophagus is unremarkable in appearance. No axillary lymphadenopathy. Lungs/Pleura: High-resolution images demonstrate no significant regions of septal thickening, subpleural reticulation, parenchymal banding, traction bronchiectasis or frank honeycombing. However, there is a mosaic attenuation in the lungs bilaterally which persist during both inspiratory and expiratory phase imaging with areas of lucency interspersed with areas of very mild ground-glass attenuation. No confluent consolidative airspace disease. No pleural effusions. No suspicious appearing pulmonary nodules or masses are noted. Upper Abdomen: Diffuse low attenuation throughout the visualized hepatic parenchyma, indicative of hepatic steatosis. Musculoskeletal: There are no aggressive appearing lytic or blastic lesions noted in the visualized portions of the skeleton. IMPRESSION: 1. No findings to suggest interstitial lung disease. 2. There is a spectrum of findings concerning for small vessel disease in the lungs, as detailed above. 3. Dilatation of the pulmonic trunk (4.2 cm in diameter), concerning for pulmonary arterial hypertension start that compatible with reported clinical history of pulmonary arterial hypertension. 4. Hepatic steatosis. Aortic Atherosclerosis (ICD10-I70.0) and Emphysema (ICD10-J43.9). Electronically Signed   By: Vinnie Langton M.D.   On: 12/21/2019 12:50     Medications:     Scheduled Medications: . benzonatate  100 mg  Oral TID  . enoxaparin (LOVENOX) injection  40 mg Subcutaneous Q24H  . furosemide  80 mg Intravenous BID  . hydrALAZINE  100 mg Oral Q8H  . NIFEdipine  90 mg Oral Daily  . pantoprazole  40 mg Oral Daily  . potassium chloride  40 mEq Oral Daily  . sodium chloride flush  3 mL Intravenous Q12H  . sodium chloride flush  3 mL Intravenous Q12H    Infusions: . sodium chloride      PRN Medications: sodium chloride, acetaminophen **OR** acetaminophen, HYDROcodone-acetaminophen, ipratropium-albuterol, ondansetron **OR** ondansetron (ZOFRAN) IV, sodium chloride, sodium chloride flush    Assessment/Plan   1. Acute on chronic diastolic CHF with significant RV dysfunction: Echo with LV E 55-60%, moderate RV dilation/moderate RV dysfunction.  [place] 2/18 showed elevated left and right heart filling pressures with severe pulmonary hypertension.  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   -Brisk diuresis noted. Overall weigh down 30 pounds.  - Continue Lasix at 80 mg IV bid today.   - Renal function stable.  2. Pulmonary hypertension: I suspect that this is a mixed picture with group 2 and group 3 PH (related to diastolic LV dysfunction and OHS/OSA).  Cannot fully rule out group 1 PH. She was on Opsumit and tadalafil at home (currently out of them for about 2 wks).  V/Q scan not suggestive of chronic PEs and CT chest in 2019 was not suggestive of ILD. HIV and rheumatologic serologies negative.  [place]  showed severe mixed pulmonary venous/pulmonary arterial hypertension.  - For now, will diurese aggressively to try to get filling pressures down.  - PFTs today.  -High Resolution CT- concerning  for small vessel disease, no ILD, and concerning for PAH. .  - Suspect that the key to treatment will be maintaining oxygen saturation with home O2 and CPAP. Weight loss also would be helpful.  - For now, will probably restart her on the Opsumit  and tadalafil that she was on at home once she is fully diuresed.  3. TOI:ZTIWPYK elevated.    - Continue nifedipine XL 90 mg daily.  - Continue  hydralazine to 100 mg tid.  - Will hold off on telmisartan for now with elevated creatinine.  4. AKI and CKD stage 3: Hold telmisartan.  Creatinine 2.1 today. Stable.   Length of Stay: 5  Amy Clegg, NP  12/22/2019, 7:52 AM  Advanced Heart Failure Team Pager [phone] (M-F; 7a - 4p)  Please contact [facility] for night-coverage after hours (4p -7a ) and weekends on [email or web address]  Patient seen with NP, agree with the above note.   Volume status is improved, weight down now around 30 lbs.  She got IV Lasix this morning.   On exam, no JVD or edema.   Will stop IV lasix and start torsemide 40 qam/20 qpm.  She had PFTs this morning, no report yet.  Will restart Opsumit 10 mg daily and tadalafil 20 mg daily, will work on making sure she gets them after discharge.    Hopefully home in am.   [name] 12/22/2019 1:09 PM

## 2019-12-23 ENCOUNTER — Telehealth (HOSPITAL_COMMUNITY): Payer: Self-pay | Admitting: Pharmacist

## 2019-12-23 LAB — BASIC METABOLIC PANEL
Anion gap: 12 (ref 5–15)
BUN: 33 mg/dL — ABNORMAL HIGH (ref 6–20)
CO2: 25 mmol/L (ref 22–32)
Calcium: 9 mg/dL (ref 8.9–10.3)
Chloride: 100 mmol/L (ref 98–111)
Creatinine, Ser: 2.01 mg/dL — ABNORMAL HIGH (ref 0.44–1.00)
GFR calc Af Amer: 32 mL/min — ABNORMAL LOW (ref >60)
GFR calc non Af Amer: 28 mL/min — ABNORMAL LOW (ref >60)
Glucose, Bld: 112 mg/dL — ABNORMAL HIGH (ref 70–99)
Potassium: 3.9 mmol/L (ref 3.5–5.1)
Sodium: 137 mmol/L (ref 135–145)

## 2019-12-23 MED ORDER — HYDRALAZINE HCL 100 MG PO TABS: 100.0000 mg | ORAL_TABLET | Freq: Three times a day (TID) | ORAL | 0 refills | Status: DC

## 2019-12-23 MED ORDER — POTASSIUM CHLORIDE CRYS ER 20 MEQ PO TBCR: 40.0000 meq | EXTENDED_RELEASE_TABLET | Freq: Every day | ORAL | 0 refills | Status: DC

## 2019-12-23 MED ORDER — TADALAFIL 20 MG PO TABS: 20.0000 mg | ORAL_TABLET | Freq: Every day | ORAL | 0 refills | Status: DC

## 2019-12-23 MED ORDER — TORSEMIDE 20 MG PO TABS: 20.0000 mg | ORAL_TABLET | Freq: Every evening | ORAL | 0 refills | Status: DC

## 2019-12-23 MED ORDER — NIFEDIPINE ER OSMOTIC RELEASE 90 MG PO TB24: 90.0000 mg | ORAL_TABLET | Freq: Every day | ORAL | 0 refills | Status: DC

## 2019-12-23 MED ORDER — TORSEMIDE 20 MG PO TABS: 40.0000 mg | ORAL_TABLET | Freq: Every day | ORAL | 0 refills | Status: DC

## 2019-12-23 MED ORDER — PANTOPRAZOLE SODIUM 40 MG PO TBEC: 40.0000 mg | DELAYED_RELEASE_TABLET | Freq: Every day | ORAL | 0 refills | Status: DC

## 2019-12-23 MED ORDER — OPSUMIT 10 MG PO TABS: 10.0000 mg | ORAL_TABLET | Freq: Every day | ORAL | 0 refills | Status: AC

## 2019-12-23 NOTE — Progress Notes (Signed)
Nutrition Education Note  RD consulted for nutrition education regarding new onset CHF. Patient reports that she eats a lot of sodium. She has tried to make some dietary changes to reduce her sodium intake, but it has been difficult because she really likes processed and other salty foods.   RD provided "Low Sodium Nutrition Therapy" handout from the [employer] of [employer]. Reviewed patient's dietary recall. Provided examples on ways to decrease sodium intake in diet. Discouraged intake of processed foods and use of salt shaker. Encouraged fresh fruits and vegetables as well as whole grain sources of carbohydrates to maximize fiber intake.   RD discussed why it is important for patient to adhere to diet recommendations, and emphasized the role of fluids, foods to avoid, and importance of weighing self daily. Teach back method used.  Expect fair compliance.  Body mass index is 46.35 kg/m. Pt meets criteria for class 3, extreme/morbid obesity based on current BMI.  Patient is being discharged home today. No further nutrition interventions indicated at this time.   Dawn Fowler, RD, LDN, CNSC Please refer to [facility] for contact information.

## 2019-12-23 NOTE — Progress Notes (Signed)
Pt's stable, dc home via wheelchair 

## 2019-12-23 NOTE — Progress Notes (Addendum)
Patient ID: Dawn Fowler, female   DOB: Jan 12, 1966, 54 y.o.   MRN: [id number]     Advanced Heart Failure Rounding Note  PCP-Cardiologist: No primary care provider on file.   Subjective:    Feeling much better. Wants to go home.   PFTs: Severe restriction, severely decreased DLCO.   High Resolution CT   1. No findings to suggest interstitial lung disease. 2. There is a spectrum of findings concerning for small vessel disease in the lungs, as detailed above. 3. Dilatation of the pulmonic trunk (4.2 cm in diameter), concerning for pulmonary arterial hypertension start that compatible with reported clinical history of pulmonary arterial hypertension. 4. Hepatic steatosis.  Echo: EF 55-60%, moderately dilated RV with moderately decreased systolic function.   V/Q scan: No evidence for chronic PE  RHC Procedural Findings: Hemodynamics (mmHg) RA mean 14 RV 87/21 PA 89/33, mean 54 PCWP mean 34 Oxygen saturations: SVC 63% PA 61% AO 94% Cardiac Output (Fick) 5.17  Cardiac Index (Fick) 2.22 PVR 3.87 WU   Objective:   Weight Range: 111.3 kg Body mass index is 46.35 kg/m.   Vital Signs:   Temp:  [97.5 F (36.4 C)-97.9 F (36.6 C)] 97.5 F (36.4 C) (02/23 0449) Pulse Rate:  [74-98] 74 (02/23 0449) Resp:  [18-20] 18 (02/23 0449) BP: (120-148)/(90-97) 120/90 (02/23 0449) SpO2:  [89 %-94 %] 94 % (02/23 0449) Weight:  [111.3 kg] 111.3 kg (02/23 0449) Last BM Date: 12/22/19  Weight change: Filed Weights   12/21/19 0737 12/22/19 0342 12/23/19 0449  Weight: 113.4 kg 112.1 kg 111.3 kg    Intake/Output:   Intake/Output Summary (Last 24 hours) at 12/23/2019 0746 Last data filed at 12/23/2019 0459 Gross per 24 hour  Intake 585 ml  Output 3100 ml  Net -2515 ml      Physical Exam   General: NAD Neck: No JVD, no thyromegaly or thyroid nodule.  Lungs: Clear to auscultation bilaterally with normal respiratory effort. CV: Nondisplaced PMI.  Heart regular S1/S2, no  S3/S4, no murmur.  No peripheral edema.   Abdomen: Soft, nontender, no hepatosplenomegaly, no distention.  Skin: Intact without lesions or rashes.  Neurologic: Alert and oriented x 3.  Psych: Normal affect. Extremities: No clubbing or cyanosis.  HEENT: Normal.    Telemetry   NSR 80-90s personally reviewed.    Labs    CBC No results for input(s): WBC, NEUTROABS, HGB, HCT, MCV, PLT in the last 72 hours. Basic Metabolic Panel Recent Labs    12/22/19 0334 12/23/19 0340  NA 137 137  K 4.0 3.9  CL 98 100  CO2 26 25  GLUCOSE 109* 112*  BUN 33* 33*  CREATININE 2.13* 2.01*  CALCIUM 9.2 9.0   Liver Function Tests No results for input(s): AST, ALT, ALKPHOS, BILITOT, PROT, ALBUMIN in the last 72 hours. No results for input(s): LIPASE, AMYLASE in the last 72 hours. Cardiac Enzymes No results for input(s): CKTOTAL, CKMB, CKMBINDEX, TROPONINI in the last 72 hours.  BNP: BNP (last 3 results) Recent Labs    11/02/19 0020 11/03/19 0445 12/17/19 1655  BNP 29.8 25.1 958.5*    ProBNP (last 3 results) No results for input(s): PROBNP in the last 8760 hours.   D-Dimer No results for input(s): DDIMER in the last 72 hours. Hemoglobin A1C No results for input(s): HGBA1C in the last 72 hours. Fasting Lipid Panel No results for input(s): CHOL, HDL, LDLCALC, TRIG, CHOLHDL, LDLDIRECT in the last 72 hours. Thyroid Function Tests No results for input(s): TSH, T4TOTAL,  T3FREE, THYROIDAB in the last 72 hours.  Invalid input(s): FREET3  Other results:   Imaging    No results found.   Medications:     Scheduled Medications: . enoxaparin (LOVENOX) injection  40 mg Subcutaneous Q24H  . hydrALAZINE  100 mg Oral Q8H  . macitentan  10 mg Oral Daily  . NIFEdipine  90 mg Oral Daily  . pantoprazole  40 mg Oral Daily  . potassium chloride  40 mEq Oral Daily  . sodium chloride flush  3 mL Intravenous Q12H  . sodium chloride flush  3 mL Intravenous Q12H  . tadalafil  20 mg Oral  Daily  . torsemide  20 mg Oral QPM  . torsemide  40 mg Oral Q breakfast    Infusions: . sodium chloride      PRN Medications: sodium chloride, acetaminophen **OR** acetaminophen, HYDROcodone-acetaminophen, ipratropium-albuterol, ondansetron **OR** ondansetron (ZOFRAN) IV, sodium chloride, sodium chloride flush    Assessment/Plan   1. Acute on chronic diastolic CHF with significant RV dysfunction: Echo with LV E 55-60%, moderate RV dilation/moderate RV dysfunction.  [place] 2/18 showed elevated left and right heart filling pressures with severe pulmonary hypertension.  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   -Overall weigh down 32 pounds.  - Started torsemide 40 mg/20 mg.  2. Pulmonary hypertension: I suspect that this is a mixed picture with group 2 and group 3 PH (related to diastolic LV dysfunction and OHS/OSA).  Cannot fully rule out group 1 PH. She was on Opsumit and tadalafil at home (currently out of them for about 2 wks).  V/Q scan not suggestive of chronic PEs and CT chest in 2019 was not suggestive of ILD. HIV and rheumatologic serologies negative.  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  - Volume status stable.   - PFTs completed => severe restriction, severely decreased DLCO.  -High Resolution CT- concerning  for small vessel disease, no ILD, and concerning for PAH.   - Suspect that the key to treatment will be maintaining oxygen saturation with home O2 and CPAP. Weight loss also would be helpful.  - Continue  Opsumit and tadalafil that she was on at home once she is fully diuresed. Pharmacy working on it.  3. ZOX:WRUEAVW elevated.    - Continue nifedipine XL 90 mg daily.  - Continue  hydralazine to 100 mg tid.  - Will hold off on telmisartan for now with elevated creatinine.  -Consider  4. AKI and CKD stage 3b: Hold telmisartan.  Creatinine 2.0  today.   Says she  cant go home until she has keys to her house. Her son accidentally took her keys home and he lives 2 hours away.   Still working on tadalafil and opsumit. We will set up follow up for next week.   We will set up follow up in the HF. March 4th at 2:00  Length of Stay: 6  Darrick Grinder, NP  12/23/2019, 7:46 AM  Advanced Heart Failure Team Pager [phone] (M-F; 7a - 4p)  Please contact [facility] for night-coverage after hours (4p -7a ) and weekends on [email or web address]  Patient seen with NP, agree with the above note.   Weight down 32 lbs now, feels much better.  Now on po torsemide.   General: NAD Neck: No JVD, no thyromegaly or thyroid nodule.  Lungs: Clear to auscultation bilaterally with normal respiratory effort. CV: Nondisplaced PMI.  Heart regular S1/S2, no S3/S4, no murmur.  No  peripheral edema.   Abdomen: Soft, nontender, no hepatosplenomegaly, no distention.  Skin: Intact without lesions or rashes.  Neurologic: Alert and oriented x 3.  Psych: Normal affect. Extremities: No clubbing or cyanosis.  HEENT: Normal.   I think that she could go home today.  She is going to restart on Opsumit and tadalafil, suspect mixed pulmonary hypertension with group 2 and group 3 PH, but possibly also group 1 component (PH out of proportion to intrinsic lung disease).   Cardiac meds for home: Torsemide 40 qam/20 qpm, Opsumit 10 daily, tadalafil 20 daily, nifedipine XL 90 daily, hydralazine 100 mg tid, KCl 40 daily.  CHF clinic followup set up.   [name] 12/23/2019 8:37 AM

## 2019-12-23 NOTE — TOC Initial Note (Signed)
Transition of Care [facility]) - Initial/Assessment Note    Patient Details  Name: Dawn Fowler MRN: [id number] Date of Birth: [birth year]  [facility] CM/SW Contact:    Bethena Roys, RN Phone Number: 12/23/2019, 11:46 AM  Clinical Narrative:  Patient presented for Acute Respiratory Failure with Hypoxia. Prior to arrival patient was from home with her dog and has support of son. Patient has durable medical equipment, Rollator, CPAP and 02 via [employer]. Patient does not need any equipoment needs at this time. Patient uses [employer] and they deliver all medications. Patient gets groceries delivered via [employer]. Patient has a scale in the home that sue uses. Patient has a primary care provider that she will make hospital follow up appointment with. Case Manager offered choice for home health Physical Therapy- patient declined at this time. Patient states she feels like she will be alright once she gets home. Patient is aware that if she needs services in the future to contact her primary care provider. Physician and Staff RN aware that patient declined services. Patient asked for nutrition consult prior to transition home-case manager relayed information to the staff RN. Case Manager asked staff RN to provide patient with the heart failure packet. No further needs identified at this time.                  Expected Discharge Plan: Home/Self Care Barriers to Discharge: No Barriers Identified   Patient Goals and CMS Choice Patient states their goals for this hospitalization and ongoing recovery are:: "to get home and walk her dog without getting so winded" CMS Medicare.gov Compare Post Acute Care list provided to:: Patient Choice offered to / list presented to : NA  Expected Discharge Plan and Services Expected Discharge Plan: Home/Self Care In-house Referral: Nutrition(Patient asked for nutrition consult during time of visit.) Discharge Planning Services:  CM Consult Post Acute Care Choice: Home Health(Refused.) Living arrangements for the past 2 months: Apartment Expected Discharge Date: 12/23/19                      [facility] Arranged: Patient Refused HH     Prior Living Arrangements/Services Living arrangements for the past 2 months: Apartment Lives with:: Self(Has support of her son) Patient language and need for interpreter reviewed:: Yes Do you feel safe going back to the place where you live?: Yes      Need for Family Participation in Patient Care: Yes (Comment) Care giver support system in place?: Yes (comment) Current home services: DME(Has 02 via [place] and CPAP- Rollator.) Criminal Activity/Legal Involvement Pertinent to Current Situation/Hospitalization: No - Comment as needed  Activities of Daily Living   ADL Screening (condition at time of admission) Patient's cognitive ability adequate to safely complete daily activities?: Yes Patient able to express need for assistance with ADLs?: Yes Independently performs ADLs?: Yes (appropriate for developmental age)  Permission Sought/Granted Permission sought to share information with : Family Supports    Emotional Assessment Appearance:: Appears stated age Attitude/Demeanor/Rapport: Gracious Affect (typically observed): Appropriate Orientation: : Oriented to Situation, Oriented to  Time, Oriented to Place, Oriented to Self Alcohol / Substance Use: Not Applicable Psych Involvement: No (comment)  Admission diagnosis:  Exertional dyspnea [R06.00] Pitting edema [R60.9] Pulmonary hypertension (HCC) [I27.20] CHF exacerbation ([place]) [I50.9] Patient Active Problem List   Diagnosis Date Noted  . RVF (right ventricular failure) ([place])   . Exertional dyspnea   . Pitting edema   . CHF exacerbation ([place])  12/17/2019  . Chronic respiratory failure with hypoxia ([place]) 11/20/2019  . OSA (obstructive sleep apnea) 11/20/2019  . COVID-19 virus detected 11/20/2019  . Complex care  coordination 11/20/2019  . Obesity 11/20/2019  . Acute respiratory failure with hypoxia ([place]) 10/30/2019  . Pulmonary hypertension ([place]) 10/30/2019  . Gout 10/30/2019  . CKD (chronic kidney disease) stage 3, GFR 30-59 ml/min 10/30/2019   PCP:  Hayden Rasmussen, MD Pharmacy:   [facility] [place], [place] - [address] [place] [address] Phone: [phone] Fax: [phone]     Social Determinants of Health (SDOH) Interventions    Readmission Risk Interventions No flowsheet data found.

## 2019-12-23 NOTE — Care Management (Signed)
Per [facility] pharmacy help desk. [phone].  Co-pay amount for Torsemide po 20 mg $3.70 for 30 day supply, (does not come in 40 mg.)  Co-pay Opsumit po 10mg  daily for 30 day supply $562.98. Co-pay for Nifedipine po XL 90 mg daily for30 day supply $3.70.  Tadalafil 20mg . Daily (NOT COVERED).  No PA required No deductible Tier 2 and 5 Retail pharmacy : [facility] and [facility]  Ref# [id number]

## 2019-12-23 NOTE — Care Management (Signed)
0937 12-23-19 Benefits check placed for Torsemide 40 qam/20 qpm, Opsumit 10 daily, tadalafil 20 daily, nifedipine XL 90 daily. Bethena Roys, RN, BSN Case Manager [phone]

## 2019-12-23 NOTE — Care Management Important Message (Signed)
Important Message  Patient Details  Name: Dawn Fowler MRN: [id number] Date of Birth: 1966-10-25   Medicare Important Message Given:  Yes     [name] 12/23/2019, 10:08 AM

## 2019-12-23 NOTE — Discharge Instructions (Signed)
Pulmonary Hypertension Pulmonary hypertension is a long-term (chronic) condition in which there is high blood pressure in the arteries in the lungs (pulmonary arteries). This condition occurs when pulmonary arteries become narrow and tight, making it harder for blood to flow through the lungs. This in turn makes the heart work harder to pump blood through the lungs, making it harder for you to breathe. Over time, pulmonary hypertension can weaken and damage the heart muscle, specifically the right side of the heart. Pulmonary hypertension is a serious condition that can be life-threatening. What are the causes? This condition may be caused by different medical conditions. It can be categorized by cause into five groups:  Group 1: Pulmonary hypertension that is caused by abnormal growth of small blood vessels in the lungs (pulmonary arterial hypertension). The abnormal blood vessel growth may have no known cause, or it may be: ? Passed from parent to child (hereditary). ? Caused by another disease, such as a connective tissue disease (including lupus or scleroderma), congenital heart disease, liver disease, or HIV. ? Caused by certain medicines or poisons (toxins).  Group 2: Pulmonary hypertension that is caused by weakness of the left chamber of the heart (left ventricle) or heart valve disease.  Group 3: Pulmonary hypertension that is caused by lung disease or low oxygen levels. Causes in this group include: ? Emphysema or chronic obstructive pulmonary disease (COPD). ? Untreated sleep apnea. ? Pulmonary fibrosis. ? Long-term exposure to high altitudes in certain people who may already be at higher risk for pulmonary hypertension.  Group 4: Pulmonary hypertension that is caused by blood clots in the lungs (pulmonary emboli).  Group 5: Other causes of pulmonary hypertension, such as sickle cell anemia, sarcoidosis, tumors pressing on the pulmonary arteries, and various other diseases. What are  the signs or symptoms? Symptoms of this condition include:  Shortness of breath. You may notice shortness of breath with: ? Activity, such as walking. ? Minimal activity, such as getting dressed. ? No activity, like when you are sitting still.  A cough. Sometimes, bloody mucus from the lungs may be coughed up (hemoptysis).  Tiredness and fatigue.  Dizziness, lightheadedness, or fainting, especially with physical activity.  Rapid heartbeat, or feeling your heart flutter or skip a beat (palpitations).  Veins in the neck getting larger.  Swelling of the lower legs, abdomen, or both.  Bluish color of the lips and fingertips.  Chest pain or tightness in the chest.  Abdominal pain, especially in the upper abdomen. How is this diagnosed? This condition may be diagnosed based on one or more of the following tests:  Chest X-ray.  Blood tests.  CT scan.  Pulmonary function test. This test measures how much air your lungs can hold. It also tests how well air moves in and out of your lungs.  6-minute walk test. This tests how severe your condition is in relation to your activity levels.  Electrocardiogram (ECG). This test records the electrical impulses of the heart.  Echocardiogram. This test uses sound waves (ultrasound) to produce an image of the heart.  Cardiac catheterization. This is a procedure in which a thin tube (catheter) is passed into the pulmonary artery and used to test the pressure in your pulmonary artery and the right side of your heart.  Lung biopsy. This involves having a procedure to remove a small sample of lung tissue for testing. This may help determine an underlying cause of your pulmonary hypertension. How is this treated? There is no cure for   this condition, but treatment can help to relieve symptoms and slow the progress of the condition. Treatment may include:  Cardiac rehabilitation. This is a treatment program that includes exercise training,  education, and counseling to help you get stronger and return to an active lifestyle.  Oxygen therapy.  Medicines that: ? Lower blood pressure. ? Relax (dilate) the pulmonary blood vessels. ? Help the heart beat more efficiently and pump more blood. ? Help the body get rid of extra fluid (diuretics). ? Thin the blood in order to prevent blood clots in the lungs.  Lung surgery to relieve pressure on the heart, for severe cases that do not respond to medical treatment.  Heart-lung transplant, or lung transplant. This may be done in very severe cases. Follow these instructions at home: Eating and drinking   Eat a healthy diet that includes plenty of fresh fruits and vegetables, whole grains, and beans.  Limit your salt (sodium) intake to less than 2,300 mg a day. Lifestyle  Do not use any products that contain nicotine or tobacco, such as cigarettes and e-cigarettes. If you need help quitting, ask your health care provider.  Avoid secondhand smoke. Activity  Get plenty of rest.  Exercise as directed. Talk with your health care provider about what type of exercise is safe for you.  Avoid hot tubs and saunas.  Avoid high altitudes. General instructions  Take over-the-counter and prescription medicines only as told by your health care provider. Do not change or stop medicines without checking with your health care provider.  Stay up to date on your vaccines, especially yearly flu (influenza) and pneumonia vaccines.  If you are a woman of child-bearing age, avoid becoming pregnant. Talk with your health care provider about birth control.  Consider ways to get support for anxiety and stress of living with pulmonary hypertension. Talk with your health care provider about support groups and online resources.  Use oxygen therapy at home as directed.  Keep track of your weight. Weight gain could be a sign that your condition is getting worse.  Keep all follow-up visits as told by  your health care provider. This is important. Contact a health care provider if:  Your cough gets worse.  You have more shortness of breath than usual, or you start to have trouble doing activities that you could do before.  You need to use medicines or oxygen more frequently or in higher dosages than usual. Get help right away if:  You have severe shortness of breath.  You have chest pain or pressure.  You cough up blood.  You have swelling of your feet or legs that gets worse.  You have rapid weight gain over a period of 1-2 days.  Your medicines or oxygen do not provide relief. Summary  Pulmonary hypertension is a chronic condition in which there is high blood pressure in the arteries in the lungs (pulmonary arteries).  Pulmonary hypertension is a serious condition that can be life-threatening. It can be caused by a variety of illnesses.  Treatment may involve taking medicines and using oxygen therapy. Severe cases may require surgery or a transplant. This information is not intended to replace advice given to you by your health care provider. Make sure you discuss any questions you have with your health care provider. Document Revised: 09/28/2017 Document Reviewed: 01/09/2017 Elsevier Patient Education  2020 [employer]  

## 2019-12-23 NOTE — Discharge Summary (Signed)
Discharge Summary  Dawn Fowler MBT:[id number] DOB: 05/12/1966  PCP: Hayden Rasmussen, MD  Admit date: 12/17/2019 Discharge date: 12/23/2019  Time spent: 35 minutes  Recommendations for Outpatient Follow-up:  1. Follow-up with cardiology 2. Follow-up with pulmonology 3. Follow-up with your primary care provider 4. Take your medications as prescribed 5. Continue PT OT with assistance and fall precautions.  Discharge Diagnoses:  Active Hospital Problems   Diagnosis Date Noted  . RVF (right ventricular failure) ([facility])   . Exertional dyspnea   . Pitting edema   . OSA (obstructive sleep apnea) 11/20/2019  . Obesity 11/20/2019  . Acute respiratory failure with hypoxia ([facility]) 10/30/2019  . Pulmonary hypertension ([facility]) 10/30/2019  . CKD (chronic kidney disease) stage 3, GFR 30-59 ml/min 10/30/2019    Resolved Hospital Problems  No resolved problems to display.    Discharge Condition: Stable  Diet recommendation: Heart healthy diet.  Vitals:   12/22/19 1955 12/23/19 0449  BP: (!) 148/97 120/90  Pulse: 98 74  Resp: 18 18  Temp: 97.9 F (36.6 C) (!) 97.5 F (36.4 C)  SpO2: (!) 89% 94%    History of present illness:  Dawn McKinley54 year old female with a history of chronic hypoxic respiratory failure secondary to restrictive lung disease on chronic home O2 (8 L/min baseline), pulmonary artery hypertension, obstructive sleep apneaon CPAP,stage III chronic kidney disease,obesity, prior h/o PE,and recent hospitalization for COVID-19 pneumonia 09/2019.According to records, she had avery mild case of Covid and responded very well to IV steroids and remdesivir. She did not require intubation. She was discharged home on November 03, 2019.She presented with worsening shortness of breath,unable to walk few steps due to dyspnea and weight gain of about 10 pound in 2 weeks. She was started on metolazone recently along with increased dose of Lasix.  She came to ED for further  evaluation.  TRH asked to admit.  She was evaluated and managed for PAH directed by advanced heart failure team.  Work-up revealed suspected mixed pulmonary hypertension with group 2 and group 3 PAH but possibly also could be 1 component (PH out of proportion to intrinsic lung disease).  Cardiology recommended to restart pulmonary hypertension medications.  Diuresed with net I&O -17.0 L.  Cardiac meds for home: Torsemide 40 qam/20 qpm, Opsumit 10 daily, tadalafil 20 daily, nifedipine XL 90 daily, hydralazine 100 mg tid, KCl 40 daily.  CHF clinic followup set up.   [name] 12/23/2019 8:37 AM  2/23: Seen and examined.  States she feels better.  She has no new complaints.  No acute events overnight.  She wants to go home.  Okay to discharge per cardiology.  TOC assisting with home health services.  PFTs 2/21: Severe restriction, severely decreased DLCO.   High Resolution CT 2/21:  1. No findings to suggest interstitial lung disease. 2. There is a spectrum of findings concerning for small vessel disease in the lungs, as detailed above. 3. Dilatation of the pulmonic trunk (4.2 cm in diameter), concerning for pulmonary arterial hypertension start that compatible with reported clinical history of pulmonary arterial hypertension. 4. Hepatic steatosis.  Echo 2/18: EF 55-60%, moderately dilated RV with moderately decreased systolic function.   V/Q scan 2/19: No evidence for chronic PE  RHC Procedural Findings: Hemodynamics (mmHg) RA mean 14 RV 87/21 PA 89/33, mean 54 PCWP mean 34 Oxygen saturations: SVC 63% PA 61% AO 94% Cardiac Output (Fick) 5.17  Cardiac Index (Fick) 2.22 PVR 3.87 WU    Hospital Course:  Active Problems:  Acute respiratory failure with hypoxia (HCC)   Pulmonary hypertension (HCC)   CKD (chronic kidney disease) stage 3, GFR 30-59 ml/min   OSA (obstructive sleep apnea)   Obesity   Exertional dyspnea   Pitting edema   RVF (right ventricular  failure) (HCC)  Acute on chronic hypoxic respiratory failure secondary to acute on chronic diastolic CHF with significant RV dysfunction Management according to heart failure team. Diuresed with improvement of O2 saturation. Continue home bronchodilator Follow up with your pulmonologist  Home O2 evaluation prior to dc Maintain O2 sats >90%  Pulmonary artery hypertension suspected mixed She was evaluated and managed for PAH directed by advanced heart failure team.  Work-up revealed suspected mixed pulmonary hypertension with group 2 and group 3 PAH but possibly also could be 1 component (PH out of proportion to intrinsic lung disease).  No evidence of PE on VQ scan. OSA, needs to be compliant with CPAP CT chest no evidence of ILD.  Emphysema reported with concern for small vessel disease Post PFT>>severe restriction, severely decreased DLCO.  Re-started on Cialis and Opsumit 2/22 Follow up with pulmonology and cardiology  Acute on chronic diastolic CHF 2D echo showed preserved LVEF 55 to 60% with moderate RV dilatation/RV dysfunction.  Severe pulmonary artery hypertension.  Grade 1 diastolic dysfunction. Net I&O  -17.0L Cardiac meds for home: Torsemide 40 qam/20 qpm, Opsumit 10 daily, tadalafil 20 daily, nifedipine XL 90 daily, hydralazine 100 mg tid, KCl 40 daily.  CHF clinic followup set up.   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 Presented with creatinine of 2.3 with GFR of 27. She is back to her baseline creatinine, creatinine 2.0 with GFR of 32 on 12/23/2019. Continue to hold ARB, avoid nephrotoxins and hypotension Follow-up with your PCP.  Essential hypertension Blood pressure is at goal Blood pressure 120/90, heart rate 74, respiration rate 18. Continue antihypertensives as recommended by heart failure team.   OSA on CPAP Continue CPAP at night  History of COVID-19 viral pneumonia. Per H&P: discharged1/4/21, treated with  steroids and remdesivir. Out of quarantine window.  Severe obesity BMI 49>> 46 Recommend weight loss outpatient with regular physical activity and healthy dieting.  Hepatic steatosis on CT scan Recommend weight loss Recommend follow-up with PCP   Code Status: Full code     Consultants:  Advanced heart failure team.  Procedures:  PFT  Antimicrobials:  None  DVT prophylaxis: Subcu Lovenox daily   Discharge Exam: BP 120/90 (BP Location: Left Arm)   Pulse 74   Temp (!) 97.5 F (36.4 C) (Oral)   Resp 18   Ht 5\' 1"  (1.549 m)   Wt 111.3 kg   SpO2 94%   BMI 46.35 kg/m  . General: 54 y.o. year-old female well developed well nourished in no acute distress.  Alert and oriented x3. . Cardiovascular: Regular rate and rhythm with no rubs or gallops.  No thyromegaly or JVD noted.   Marland Kitchen Respiratory: Clear to auscultation with no wheezes or rales. Good inspiratory effort. . Abdomen: Soft nontender nondistended with normal bowel sounds x4 quadrants. . Musculoskeletal: No lower extremity edema. 2/4 pulses in all 4 extremities. Marland Kitchen Psychiatry: Mood is appropriate for condition and setting  Discharge Instructions You were cared for by a hospitalist during your hospital stay. If you have any questions about your discharge medications or the care you received while you were in the hospital after you are discharged, you can call the unit and asked  to speak with the hospitalist on call if the hospitalist that took care of you is not available. Once you are discharged, your primary care physician will handle any further medical issues. Please note that NO REFILLS for any discharge medications will be authorized once you are discharged, as it is imperative that you return to your primary care physician (or establish a relationship with a primary care physician if you do not have one) for your aftercare needs so that they can reassess your need for medications and monitor your lab  values.  Discharge Instructions    MyChart COVID-19 home monitoring program   Complete by: Dec 23, 2019    Is the patient willing to use the [facility] for home monitoring?: Yes     Allergies as of 12/23/2019      Reactions   Adhesive [tape] Hives   Paper tape   Succinylcholine Other (See Comments)   Have trouble waking up   Tizanidine Hives      Medication List    STOP taking these medications   allopurinol 100 MG tablet Commonly known as: ZYLOPRIM   Alyq 20 MG tablet Generic drug: tadalafil (PAH)   furosemide 40 MG tablet Commonly known as: LASIX   metolazone 2.5 MG tablet Commonly known as: ZAROXOLYN   Potassium Chloride ER 20 MEQ Tbcr   sildenafil 20 MG tablet Commonly known as: REVATIO   telmisartan 40 MG tablet Commonly known as: MICARDIS     TAKE these medications   albuterol 108 (90 Base) MCG/ACT inhaler Commonly known as: VENTOLIN HFA Inhale 2 puffs into the lungs every 6 (six) hours as needed for wheezing or shortness of breath.   hydrALAZINE 100 MG tablet Commonly known as: APRESOLINE Take 1 tablet (100 mg total) by mouth every 8 (eight) hours. What changed:   medication strength  how much to take   NIFEdipine 90 MG 24 hr tablet Commonly known as: PROCARDIA XL/NIFEDICAL-XL Take 1 tablet (90 mg total) by mouth daily. What changed:   medication strength  how much to take   Opsumit 10 MG tablet Generic drug: macitentan Take 1 tablet (10 mg total) by mouth daily.   pantoprazole 40 MG tablet Commonly known as: PROTONIX Take 1 tablet (40 mg total) by mouth daily.   potassium chloride SA 20 MEQ tablet Commonly known as: KLOR-CON Take 2 tablets (40 mEq total) by mouth daily.   Premarin 0.625 MG tablet Generic drug: estrogens (conjugated) Take 0.625 mg by mouth daily.   tadalafil 20 MG tablet Commonly known as: CIALIS Take 1 tablet (20 mg total) by mouth daily.   torsemide 20 MG tablet Commonly known as: DEMADEX Take 2  tablets (40 mg total) by mouth daily with breakfast. What changed: when to take this   torsemide 20 MG tablet Commonly known as: DEMADEX Take 1 tablet (20 mg total) by mouth every evening. What changed: You were already taking a medication with the same name, and this prescription was added. Make sure you understand how and when to take each.   Vitamin D-3 125 MCG (5000 UT) Tabs Take 5,000 mg by mouth daily.      Allergies  Allergen Reactions  . Adhesive [Tape] Hives    Paper tape  . Succinylcholine Other (See Comments)    Have trouble waking up  . Tizanidine Hives   Follow-up Information    Margaretha Seeds, MD. Call in 1 day(s).   Specialty: Pulmonary Disease Why: Please call for a post hospital  follow up appointment Contact information: [place] [address] [phone]        Hayden Rasmussen, MD. Call in 1 day(s).   Specialty: Family Medicine Why: Please call for a post hospital follow-up appointment. Contact information: [place] [address] [phone]        Larey Dresser, MD Follow up on 01/01/2020.   Specialty: Cardiology Why: at 2:00 Contact information: [address] [place] [address] [phone]            The results of significant diagnostics from this hospitalization (including imaging, microbiology, ancillary and laboratory) are listed below for reference.    Significant Diagnostic Studies: DG Chest 2 View  Result Date: 12/17/2019 CLINICAL DATA:  Shortness of breath.  Fluid retention. EXAM: CHEST - 2 VIEW COMPARISON:  Single-view of the chest 10/29/2019. FINDINGS: There is cardiomegaly without edema. No consolidative process, pneumothorax or effusion. No acute or focal bony abnormality. IMPRESSION: Cardiomegaly without acute disease. Electronically Signed   By: Inge Rise M.D.   On: 12/17/2019 14:31   NM Pulmonary Perfusion  Result Date:  12/19/2019 CLINICAL DATA:  Short of breath.  Recent cardiac catheterization. EXAM: NUCLEAR MEDICINE PERFUSION LUNG SCAN TECHNIQUE: Perfusion images were obtained in multiple projections after intravenous injection of radiopharmaceutical. Ventilation scans intentionally deferred if perfusion scan and chest x-ray adequate for interpretation during COVID 19 epidemic. RADIOPHARMACEUTICALS:  1.6 mCi Tc-31m MAA IV COMPARISON:  Chest radiograph 12/19/2019 FINDINGS: No wedge-shaped peripheral perfusion defects within LEFT RIGHT lung to suggest acute pulmonary embolism. Prominent heart shadow noted. IMPRESSION: No evidence acute or chronic pulmonary embolism. Electronically Signed   By: Suzy Bouchard M.D.   On: 12/19/2019 11:26   CARDIAC CATHETERIZATION  Result Date: 12/18/2019 1. Elevated right and left heart filling pressures. 2. Severe pulmonary hypertension, looks like mixed pulmonary venous and pulmonary arterial hypertension.  3. Preserved cardiac output. Needs ongoing diuresis.   CT Chest High Resolution  Result Date: 12/21/2019 CLINICAL DATA:  54 year old female with history of pulmonary arterial hypertension. EXAM: CT CHEST WITHOUT CONTRAST TECHNIQUE: Multidetector CT imaging of the chest was performed following the standard protocol without intravenous contrast. High resolution imaging of the lungs, as well as inspiratory and expiratory imaging, was performed. COMPARISON:  No priors. FINDINGS: Cardiovascular: Heart size is borderline enlarged. There is no significant pericardial fluid, thickening or pericardial calcification. No atherosclerotic calcifications in the thoracic aorta or the coronary arteries. Dilatation of the pulmonic trunk (4.2 cm in diameter). Mediastinum/Nodes: No pathologically enlarged mediastinal or hilar lymph nodes. Please note that accurate exclusion of hilar adenopathy is limited on noncontrast CT scans. Esophagus is unremarkable in appearance. No axillary lymphadenopathy.  Lungs/Pleura: High-resolution images demonstrate no significant regions of septal thickening, subpleural reticulation, parenchymal banding, traction bronchiectasis or frank honeycombing. However, there is a mosaic attenuation in the lungs bilaterally which persist during both inspiratory and expiratory phase imaging with areas of lucency interspersed with areas of very mild ground-glass attenuation. No confluent consolidative airspace disease. No pleural effusions. No suspicious appearing pulmonary nodules or masses are noted. Upper Abdomen: Diffuse low attenuation throughout the visualized hepatic parenchyma, indicative of hepatic steatosis. Musculoskeletal: There are no aggressive appearing lytic or blastic lesions noted in the visualized portions of the skeleton. IMPRESSION: 1. No findings to suggest interstitial lung disease. 2. There is a spectrum of findings concerning for small vessel disease in the lungs, as detailed above. 3. Dilatation of the pulmonic trunk (  4.2 cm in diameter), concerning for pulmonary arterial hypertension start that compatible with reported clinical history of pulmonary arterial hypertension. 4. Hepatic steatosis. Aortic Atherosclerosis (ICD10-I70.0) and Emphysema (ICD10-J43.9). Electronically Signed   By: Vinnie Langton M.D.   On: 12/21/2019 12:50   DG CHEST PORT 1 VIEW  Result Date: 12/19/2019 CLINICAL DATA:  Chest pain EXAM: PORTABLE CHEST 1 VIEW COMPARISON:  12/17/2019 FINDINGS: Moderate cardiomegaly. No focal airspace consolidation. No pleural effusion or pneumothorax. IMPRESSION: Cardiomegaly without acute airspace disease. Electronically Signed   By: Ulyses Jarred M.D.   On: 12/19/2019 03:34   ECHOCARDIOGRAM COMPLETE  Result Date: 12/18/2019    ECHOCARDIOGRAM REPORT   Patient Name:   TERETHA CHALUPA Date of Exam: 12/18/2019 Medical Rec #:  [id number]       Height:       61.0 in Accession #:    [id number]      Weight:       270.9 lb Date of Birth:  01/06/1966        BSA:           2.15 m Patient Age:    65 years        BP:           159/115 mmHg Patient Gender: F               HR:           78 bpm. Exam Location:  Inpatient Procedure: 2D Echo, Cardiac Doppler and Color Doppler Indications:    Dyspnea 786.09 / R06.00  History:        Patient has no prior history of Echocardiogram examinations.                 Pulmonary HTN; Risk Factors:Hypertension and Sleep Apnea.                 COVID19. CKD.  Sonographer:    Jonelle Sidle Dance Referring Phys: [id number]  1. Left ventricular ejection fraction, by estimation, is 55 to 60%. The left ventricle has normal function. The left ventricle has no regional wall motion abnormalities. Left ventricular diastolic parameters are consistent with Grade I diastolic dysfunction (impaired relaxation).  2. Right ventricular systolic function is moderately reduced. The right ventricular size is moderately enlarged. There is severely elevated pulmonary artery systolic pressure. The estimated right ventricular systolic pressure is 41.9 mmHg. D-shaped interventricular septum is suggestive of RV pressure/volume overload.  3. Right atrial size was moderately dilated.  4. The mitral valve is normal in structure and function. Trivial mitral valve regurgitation. No evidence of mitral stenosis.  5. The aortic valve is tricuspid. Aortic valve regurgitation is not visualized. No aortic stenosis is present.  6. The inferior vena cava is normal in size with <50% respiratory variability, suggesting right atrial pressure of 8 mmHg. FINDINGS  Left Ventricle: Left ventricular ejection fraction, by estimation, is 55 to 60%. The left ventricle has normal function. The left ventricle has no regional wall motion abnormalities. The left ventricular internal cavity size was normal in size. There is  no left ventricular hypertrophy. Left ventricular diastolic parameters are consistent with Grade I diastolic dysfunction (impaired relaxation). Right Ventricle:  The right ventricular size is moderately enlarged. No increase in right ventricular wall thickness. Right ventricular systolic function is moderately reduced. There is severely elevated pulmonary artery systolic pressure. The tricuspid regurgitant velocity is 4.15 m/s, and with an assumed right atrial pressure of 8 mmHg, the estimated right ventricular systolic pressure  is 76.9 mmHg. Left Atrium: Left atrial size was normal in size. Right Atrium: Right atrial size was moderately dilated. Pericardium: Trivial pericardial effusion is present. Mitral Valve: The mitral valve is normal in structure and function. Trivial mitral valve regurgitation. No evidence of mitral valve stenosis. Tricuspid Valve: The tricuspid valve is normal in structure. Tricuspid valve regurgitation is mild. Aortic Valve: The aortic valve is tricuspid. Aortic valve regurgitation is not visualized. No aortic stenosis is present. Pulmonic Valve: The pulmonic valve was normal in structure. Pulmonic valve regurgitation is mild to moderate. Aorta: The aortic root is normal in size and structure. Venous: The inferior vena cava is normal in size with less than 50% respiratory variability, suggesting right atrial pressure of 8 mmHg. IAS/Shunts: No atrial level shunt detected by color flow Doppler.  LEFT VENTRICLE PLAX 2D LVIDd:         4.30 cm  Diastology LVIDs:         3.20 cm  LV e' lateral:   4.43 cm/s LV PW:         1.20 cm  LV E/e' lateral: 12.7 LV IVS:        0.90 cm  LV e' medial:    4.65 cm/s LVOT diam:     1.80 cm  LV E/e' medial:  12.1 LV SV:         37.28 ml LV SV Index:   17.68 LVOT Area:     2.54 cm  RIGHT VENTRICLE            IVC RV Basal diam:  3.00 cm    IVC diam: 2.00 cm RV Mid diam:    3.30 cm RV S prime:     6.68 cm/s TAPSE (M-mode): 1.7 cm LEFT ATRIUM             Index       RIGHT ATRIUM           Index LA diam:        4.60 cm 2.14 cm/m  RA Area:     28.10 cm LA Vol (A2C):   36.1 ml 16.80 ml/m RA Volume:   98.60 ml  45.89 ml/m LA  Vol (A4C):   24.7 ml 11.50 ml/m LA Biplane Vol: 30.4 ml 14.15 ml/m  AORTIC VALVE LVOT Vmax:   90.40 cm/s LVOT Vmean:  57.400 cm/s LVOT VTI:    0.147 m  AORTA Ao Root diam: 3.20 cm Ao Asc diam:  3.20 cm MITRAL VALVE                TRICUSPID VALVE MV Area (PHT): 2.32 cm     TR Peak grad:   68.9 mmHg MV Decel Time: 327 msec     TR Vmax:        415.00 cm/s MV E velocity: 56.20 cm/s MV A velocity: 117.00 cm/s  SHUNTS MV E/A ratio:  0.48         Systemic VTI:  0.15 m                             Systemic Diam: 1.80 cm [name] MD Electronically signed by [name] MD Signature Date/Time: 12/18/2019/3:12:57 PM    Final     Microbiology: Recent Results (from the past 240 hour(s))  Respiratory Panel by RT PCR (Flu A&B, Covid) - Nasopharyngeal Swab     Status: None   Collection Time: 12/17/19  7:48 PM   Specimen:  Nasopharyngeal Swab  Result Value Ref Range Status   SARS Coronavirus 2 by RT PCR NEGATIVE NEGATIVE Final    Comment: (NOTE) SARS-CoV-2 target nucleic acids are NOT DETECTED. The SARS-CoV-2 RNA is generally detectable in upper respiratoy specimens during the acute phase of infection. The lowest concentration of SARS-CoV-2 viral copies this assay can detect is 131 copies/mL. A negative result does not preclude SARS-Cov-2 infection and should not be used as the sole basis for treatment or other patient management decisions. A negative result may occur with  improper specimen collection/handling, submission of specimen other than nasopharyngeal swab, presence of viral mutation(s) within the areas targeted by this assay, and inadequate number of viral copies (<131 copies/mL). A negative result must be combined with clinical observations, patient history, and epidemiological information. The expected result is Negative. Fact Sheet for Patients:  PinkCheek.be Fact Sheet for Healthcare Providers:  GravelBags.it This test is not  yet ap proved or cleared by the [place] FDA and  has been authorized for detection and/or diagnosis of SARS-CoV-2 by FDA under an Emergency Use Authorization (EUA). This EUA will remain  in effect (meaning this test can be used) for the duration of the COVID-19 declaration under Section 564(b)(1) of the Act, 21 U.S.C. section 360bbb-3(b)(1), unless the authorization is terminated or revoked sooner.    Influenza A by PCR NEGATIVE NEGATIVE Final   Influenza B by PCR NEGATIVE NEGATIVE Final    Comment: (NOTE) The Xpert Xpress SARS-CoV-2/FLU/RSV assay is intended as an aid in  the diagnosis of influenza from Nasopharyngeal swab specimens and  should not be used as a sole basis for treatment. Nasal washings and  aspirates are unacceptable for Xpert Xpress SARS-CoV-2/FLU/RSV  testing. Fact Sheet for Patients: PinkCheek.be Fact Sheet for Healthcare Providers: GravelBags.it This test is not yet approved or cleared by the [place] FDA and  has been authorized for detection and/or diagnosis of SARS-CoV-2 by  FDA under an Emergency Use Authorization (EUA). This EUA will remain  in effect (meaning this test can be used) for the duration of the  Covid-19 declaration under Section 564(b)(1) of the Act, 21  U.S.C. section 360bbb-3(b)(1), unless the authorization is  terminated or revoked. Performed at [facility], [place] [address] [place], [place] [address]      Labs: Basic Metabolic Panel: Recent Labs  Lab 12/18/19 0319 12/18/19 1234 12/18/19 2017 12/18/19 2017 12/19/19 0319 12/19/19 0319 12/20/19 0424 12/20/19 1113 12/21/19 0339 12/22/19 0334 12/23/19 0340  NA 137   < > 139   < > 138   < > QUESTIONABLE RESULTS, RECOMMEND RECOLLECT TO VERIFY 139 138 137 137  K 3.9   < > 4.5   < > 3.8   < > QUESTIONABLE RESULTS, RECOMMEND RECOLLECT TO VERIFY 3.7 3.8 4.0 3.9  CL 108  --  105   < > 101   < > QUESTIONABLE  RESULTS, RECOMMEND RECOLLECT TO VERIFY 101 99 98 100  CO2 20*  --  25   < > 23   < > QUESTIONABLE RESULTS, RECOMMEND RECOLLECT TO VERIFY 26 27 26 25   GLUCOSE 86  --  117*   < > 94   < > QUESTIONABLE RESULTS, RECOMMEND RECOLLECT TO VERIFY 113* 128* 109* 112*  BUN 38*  --  38*   < > 38*   < > QUESTIONABLE RESULTS, RECOMMEND RECOLLECT TO VERIFY 32* 35* 33* 33*  CREATININE 2.18*  --  2.33*   < > 2.04*   < >  QUESTIONABLE RESULTS, RECOMMEND RECOLLECT TO VERIFY 2.27* 2.06* 2.13* 2.01*  CALCIUM 9.0  --  9.4   < > 9.3   < > QUESTIONABLE RESULTS, RECOMMEND RECOLLECT TO VERIFY 9.0 9.1 9.2 9.0  MG 2.1  --  1.8  --  2.1  --   --   --   --   --   --   PHOS 4.6  --   --   --   --   --   --   --   --   --   --    < > = values in this interval not displayed.   Liver Function Tests: Recent Labs  Lab 12/18/19 0319  AST 33  ALT 54*  ALKPHOS 77  BILITOT 0.6  PROT 5.3*  ALBUMIN 3.0*   No results for input(s): LIPASE, AMYLASE in the last 168 hours. No results for input(s): AMMONIA in the last 168 hours. CBC: Recent Labs  Lab 12/17/19 1655 12/18/19 0319 12/18/19 1234 12/18/19 1241  WBC 5.6 5.8  --   --   NEUTROABS 3.5  --   --   --   HGB 13.8 14.2 15.0 15.6*  HCT 45.3 45.5 44.0 46.0  MCV 101.6* 98.9  --   --   PLT 136* 249  --   --    Cardiac Enzymes: No results for input(s): CKTOTAL, CKMB, CKMBINDEX, TROPONINI in the last 168 hours. BNP: BNP (last 3 results) Recent Labs    11/02/19 0020 11/03/19 0445 12/17/19 1655  BNP 29.8 25.1 958.5*    ProBNP (last 3 results) No results for input(s): PROBNP in the last 8760 hours.  CBG: No results for input(s): GLUCAP in the last 168 hours.     Signed:  Kayleen Memos, MD [facility] 12/23/2019, 10:27 AM

## 2019-12-23 NOTE — Telephone Encounter (Signed)
Patient has [employer] for copay assistance with Opsumit and Tadalafil. Fills at [facility].   Coverage dates: 11/12/19 - 11/10/20 for $10,000  ID: [id number]  GRP: [id number]  BIN: [id number]  PCN: [id number]    Audry Riles, PharmD, BCPS, BCCP, CPP Heart Failure Clinic Pharmacist [phone]

## 2020-01-01 ENCOUNTER — Ambulatory Visit (HOSPITAL_COMMUNITY)
Admit: 2020-01-01 | Discharge: 2020-01-01 | Disposition: A | Discharge status: Home / Self Care | Payer: Medicare Other | Source: Ambulatory Visit | Attending: Cardiology | Admitting: Cardiology

## 2020-01-01 ENCOUNTER — Encounter (HOSPITAL_COMMUNITY): Payer: Self-pay

## 2020-01-01 ENCOUNTER — Other Ambulatory Visit: Payer: Self-pay

## 2020-01-01 VITALS — BP 142/90 | HR 97 | Wt 251.0 lb

## 2020-01-01 DIAGNOSIS — Z825 Family history of asthma and other chronic lower respiratory diseases: Secondary | ICD-10-CM | POA: Insufficient documentation

## 2020-01-01 DIAGNOSIS — I5032 Chronic diastolic (congestive) heart failure: Secondary | ICD-10-CM | POA: Diagnosis not present

## 2020-01-01 DIAGNOSIS — Z87891 Personal history of nicotine dependence: Secondary | ICD-10-CM | POA: Diagnosis not present

## 2020-01-01 DIAGNOSIS — I272 Pulmonary hypertension, unspecified: Secondary | ICD-10-CM | POA: Diagnosis not present

## 2020-01-01 DIAGNOSIS — Z888 Allergy status to other drugs, medicaments and biological substances status: Secondary | ICD-10-CM | POA: Insufficient documentation

## 2020-01-01 DIAGNOSIS — N1832 Chronic kidney disease, stage 3b: Secondary | ICD-10-CM | POA: Diagnosis not present

## 2020-01-01 DIAGNOSIS — I130 Hypertensive heart and chronic kidney disease with heart failure and stage 1 through stage 4 chronic kidney disease, or unspecified chronic kidney disease: Secondary | ICD-10-CM | POA: Insufficient documentation

## 2020-01-01 DIAGNOSIS — G4733 Obstructive sleep apnea (adult) (pediatric): Secondary | ICD-10-CM | POA: Diagnosis not present

## 2020-01-01 DIAGNOSIS — Z8616 Personal history of COVID-19: Secondary | ICD-10-CM | POA: Insufficient documentation

## 2020-01-01 DIAGNOSIS — Z8249 Family history of ischemic heart disease and other diseases of the circulatory system: Secondary | ICD-10-CM | POA: Diagnosis not present

## 2020-01-01 DIAGNOSIS — Z79899 Other long term (current) drug therapy: Secondary | ICD-10-CM | POA: Insufficient documentation

## 2020-01-01 DIAGNOSIS — Z86718 Personal history of other venous thrombosis and embolism: Secondary | ICD-10-CM | POA: Diagnosis not present

## 2020-01-01 LAB — BASIC METABOLIC PANEL
Anion gap: 9 (ref 5–15)
BUN: 33 mg/dL — ABNORMAL HIGH (ref 6–20)
CO2: 25 mmol/L (ref 22–32)
Calcium: 9.5 mg/dL (ref 8.9–10.3)
Chloride: 104 mmol/L (ref 98–111)
Creatinine, Ser: 1.87 mg/dL — ABNORMAL HIGH (ref 0.44–1.00)
GFR calc Af Amer: 35 mL/min — ABNORMAL LOW (ref >60)
GFR calc non Af Amer: 30 mL/min — ABNORMAL LOW (ref >60)
Glucose, Bld: 76 mg/dL (ref 70–99)
Potassium: 4.7 mmol/L (ref 3.5–5.1)
Sodium: 138 mmol/L (ref 135–145)

## 2020-01-01 MED ORDER — TADALAFIL 20 MG PO TABS: 20.0000 mg | ORAL_TABLET | Freq: Every day | ORAL | 11 refills | Status: DC

## 2020-01-01 NOTE — Patient Instructions (Addendum)
You have been referred to Dr Landis Gandy office for management of Obstructive Sleep Apnea/CPAP.  You will get a call to schedule an appointment   Labs today We will only contact you if something comes back abnormal or we need to make some changes. Otherwise no news is good news!   A prescription for Tadalafil was sent to your pharmacy.     Please call our office next week for a scale.    Your physician recommends that you schedule a follow-up appointment in: 4-6 weeks with our clinic     Please call office at [phone] option [phone] if you have any questions or concerns.    At the [facility], you and your health needs are our priority. As part of our continuing mission to provide you with exceptional heart care, we have created designated Provider Care Teams. These Care Teams include your primary Cardiologist (physician) and Advanced Practice Providers (APPs- Physician Assistants and Nurse Practitioners) who all work together to provide you with the care you need, when you need it.   You may see any of the following providers on your designated Care Team at your next follow up: Marland Kitchen Dr Glori Bickers . Dr [name] . Darrick Grinder, NP . Lyda Jester, PA . Audry Riles, PharmD   Please be sure to bring in all your medications bottles to every appointment.

## 2020-01-01 NOTE — Progress Notes (Signed)
Advanced Heart Failure Clinic Note   Referring Physician: PCP: Hayden Rasmussen, MD PCP-Cardiologist: No primary care provider on file.  AHF: Dr. Aundra Dubin   HPI: 54 y/o AAF, presenting to clinic for post hospital f/u after recent admission for a/c diastolic HF w/ volume overload, in the setting of pulmonary hypertension.   She has a long history of severe OSA, and suspect OHS/OSA.  She is on CPAP at home and 8L home oxygen (says she has been on oxygen for "years.") She recently moved to [place].  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  She additionally has significant HTN.  She had been on Opsumit 10 + tadalafil 20 mg daily.  She ran out a couple of weeks prior to being admitted to [facility]. She also has a history of prior DVT after long train ride, has had 1 or 2 negative V/Q scans.  She was hospitalized for COVID-19 at [facility] in 1/21 and had been more SOB since that time.   She was seen by [facility] post discharge and was noted to be volume overloaded, Lasix was changed to torsemide but she was unable to get to pharmacy to pick up. Subsequently, she developed progressive wt gain, nearly 20 lb and increased dyspnea, prompting her to report back to the ED, where she was found to be in acute on chronic diastolic HF and readmitted for IV diuresis and AHF consultation.   Echo with LVEF 55-60%, moderate RV dilation/moderate RV dysfunction.  [place] 2/18 showed elevated left and right heart filling pressures with severe pulmonary hypertension. PFTs completed showing severe restriction and severely decreased DLCO. High Resolution CT- concerning  for small vessel disease, no ILD, and concerning for PAH.  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 She was continued on IV diuretics and had good response, diuresing 32 lb. She was transitioned to torsemide 40 mg/20 mg. Opsumit and tadalafil retsarted. She was advised to continue w/ supp O2 and CPAP.   She presents to clinic for f/u. Doing well. Symptoms much improved. Able to do household chores, washing dishes and laundry w/o much limitation. Remains on 8L/[place]. No increased O2 requirements post discharge. Fully compliant w/ CPAP but has not yet established care w/ a new sleep disorder specialist since moving to [place]. She has been checking wt daily and denies wt gain. No LEE. She is compliant w/ Opsumit but reports she was not given supply of tadalafil from pharmacy. BP mildly elevated today at 142/90.    Review of Systems: [y] = yes, [ ]  = no   General: Weight gain [ ] ; Weight loss [ ] ; Anorexia [ ] ; Fatigue [ ] ; Fever [ ] ; Chills [ ] ; Weakness [ ]   Cardiac: Chest pain/pressure [ ] ; Resting SOB [ ] ; Exertional SOB [ ] ; Orthopnea [ ] ; Pedal Edema [ ] ; Palpitations [ ] ; Syncope [ ] ; Presyncope [ ] ; Paroxysmal nocturnal dyspnea[ ]   Pulmonary: Cough [ ] ; Wheezing[ ] ; Hemoptysis[ ] ; Sputum [ ] ; Snoring [ ]   GI: Vomiting[ ] ; Dysphagia[ ] ; Melena[ ] ; Hematochezia [ ] ; Heartburn[ ] ; Abdominal pain [ ] ; Constipation [ ] ; Diarrhea [ ] ; BRBPR [ ]   GU: Hematuria[ ] ; Dysuria [ ] ; Nocturia[ ]   Vascular: Pain in legs with walking [ ] ;  Pain in feet with lying flat [ ] ; Non-healing sores [ ] ; Stroke [ ] ; TIA [ ] ; Slurred speech [ ] ;  Neuro: Headaches[ ] ; Vertigo[ ] ; Seizures[ ] ; Paresthesias[ ] ;Blurred vision [ ] ; Diplopia [ ] ; Vision changes [ ]   Ortho/Skin: Arthritis [ ] ; Joint pain [ ] ; Muscle pain [ ] ; Joint swelling [ ] ; Back Pain [ ] ; Rash [ ]   Psych: Depression[ ] ; Anxiety[ ]   Heme: Bleeding problems [ ] ; Clotting disorders [ ] ; Anemia [ ]   Endocrine: Diabetes [ ] ; Thyroid dysfunction[ ]    Past Medical History:  Diagnosis Date  . Acute respiratory failure due to COVID-19 ([place]) 10/30/2019    . CKD (chronic kidney disease), stage III   . HTN (hypertension)   . Pulmonary HTN ([place])   . Sleep apnea    on CPAP    Current Outpatient Medications  Medication Sig Dispense Refill  . albuterol (VENTOLIN HFA) 108 (90 Base) MCG/ACT inhaler Inhale 2 puffs into the lungs every 6 (six) hours as needed for wheezing or shortness of breath. 6.7 g 0  . Cholecalciferol (VITAMIN D-3) 125 MCG (5000 UT) TABS Take 5,000 mg by mouth daily.    . hydrALAZINE (APRESOLINE) 100 MG tablet Take 1 tablet (100 mg total) by mouth every 8 (eight) hours. 180 tablet 0  . NIFEdipine (PROCARDIA XL/NIFEDICAL-XL) 90 MG 24 hr tablet Take 1 tablet (90 mg total) by mouth daily. 60 tablet 0  . OPSUMIT 10 MG tablet Take 1 tablet (10 mg total) by mouth daily. 30 tablet 0  . pantoprazole (PROTONIX) 40 MG tablet Take 1 tablet (40 mg total) by mouth daily. 30 tablet 0  . potassium chloride SA (KLOR-CON) 20 MEQ tablet Take 2 tablets (40 mEq total) by mouth daily. 60 tablet 0  . PREMARIN 0.625 MG tablet Take 0.625 mg by mouth daily.    . tadalafil (CIALIS) 20 MG tablet Take 1 tablet (20 mg total) by mouth daily. 30 tablet 0  . torsemide (DEMADEX) 20 MG tablet Take 40mg  in the AM and 20mg  in the PM     No current facility-administered medications for this encounter.    Allergies  Allergen Reactions  . Adhesive [Tape] Hives    Paper tape  . Succinylcholine Other (See Comments)    Have trouble waking up  . Tizanidine Hives      Social History   Socioeconomic History  . Marital status: Single    Spouse name: Not on file  . Number of children: Not on file  . Years of education: Not on file  . Highest education level: Not on file  Occupational History  . Not on file  Tobacco Use  . Smoking status: Former Research scientist (life sciences)  . Smokeless tobacco: Never Used  . Tobacco comment: only smoked for fun in highschool not even one a day  Substance and Sexual Activity  . Alcohol use: Not Currently  . Drug use: Not Currently  . Sexual  activity: Not on file  Other Topics Concern  . Not on file  Social History Narrative  . Not on file   Social Determinants of Health   Financial Resource Strain:   . Difficulty of Paying Living Expenses: Not on file  Food Insecurity:   . Worried About Charity fundraiser in the Last Year: Not on file  . Ran Out of Food in the Last Year: Not on file  Transportation Needs:   . Lack of Transportation (Medical): Not on file  . Lack of Transportation (  Non-Medical): Not on file  Physical Activity:   . Days of Exercise per Week: Not on file  . Minutes of Exercise per Session: Not on file  Stress:   . Feeling of Stress : Not on file  Social Connections:   . Frequency of Communication with Friends and Family: Not on file  . Frequency of Social Gatherings with Friends and Family: Not on file  . Attends Religious Services: Not on file  . Active Member of Clubs or Organizations: Not on file  . Attends Archivist Meetings: Not on file  . Marital Status: Not on file  Intimate Partner Violence:   . Fear of Current or Ex-Partner: Not on file  . Emotionally Abused: Not on file  . Physically Abused: Not on file  . Sexually Abused: Not on file      Family History  Problem Relation Age of Onset  . COPD Father   . Hypertension Brother   . Pulmonary Hypertension Neg Hx     Vitals:   01/01/20 1422  BP: (!) 142/90  Pulse: 97  SpO2: 95%  Weight: 113.9 kg (251 lb)     PHYSICAL EXAM: General:  Well appearing obese AAF on supp O2. No respiratory difficulty HEENT: normal Neck: supple. no JVD. Carotids 2+ bilat; no bruits. No lymphadenopathy or thyromegaly appreciated. Cor: PMI nondisplaced. Regular rate & rhythm. No rubs, gallops or murmurs. Lungs: clear Abdomen: obese, soft, nontender, nondistended. No hepatosplenomegaly. No bruits or masses. Good bowel sounds. Extremities: no cyanosis, clubbing, rash, edema Neuro: alert & oriented x 3, cranial nerves grossly intact. moves  all 4 extremities w/o difficulty. Affect pleasant.  ECG: not performed    ASSESSMENT & PLAN:  1. Chronic diastolic CHF with significant RV dysfunction: Echo with LVEF 55-60%, moderate RV dilation/moderate RV dysfunction.  [place] 2/18 showed elevated left and right heart filling pressures with severe pulmonary hypertension.  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  - Euvolemic on exam. NYHA II-III - Continue torsemide 40 mg/20 mg.  - Check BMP today  - She was provided a home scale today to monitor daily wts at home.  2. Pulmonary hypertension: suspect that this is a mixed picture with group 2 and group 3 PH (related to diastolic LV dysfunction and OHS/OSA).  Cannot fully rule out group 1 PH. V/Q scan not suggestive of chronic PEs and CT chest in 2019 was not suggestive of ILD. HIV and rheumatologic serologies negative. 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  - Volume status stable w/ diuretics - PFTs completed => severe restriction, severely decreased DLCO.  -High Resolution CT- concerning  for small vessel disease, no ILD, and concerning for PAH.   - Suspect that the key to treatment will be maintaining oxygen saturation with home O2 and CPAP. Weight loss also would be helpful.  - Continue Opsumit 10 mg daily. - Will send new Rx for tadalafil 20 mg daily  3. HTN: Remains mildly elevated, 142/90 - Start tadalafil 20 mg daily  - Continue nifedipine XL 90 mg daily.  - Continue  hydralazine to 100 mg tid.  - Will hold off on telmisartan for now with elevated creatinine.  4. CKD stage 3b: baseline SCr ~2 - check BMP today   5. OSA: compliant w/ CPAP has not yet established care w/ a new sleep disorder specialist since moving to [facility] - Will refer to sleep clinic at [facility] (Dr. Radford Pax Dr. Claiborne Billings)  Lyda Jester, PA-C 01/01/20

## 2020-01-02 ENCOUNTER — Telehealth (HOSPITAL_COMMUNITY): Payer: Self-pay

## 2020-01-02 NOTE — Telephone Encounter (Signed)
LM for patient to return call to office to let her know we have scales in stock and to ask her if she was able to get Tadalafil from her local pharmacy.

## 2020-01-05 ENCOUNTER — Other Ambulatory Visit: Payer: Self-pay | Admitting: Gastroenterology

## 2020-01-05 ENCOUNTER — Telehealth (HOSPITAL_COMMUNITY): Payer: Self-pay | Admitting: Pharmacist

## 2020-01-05 MED ORDER — TADALAFIL (PAH) 20 MG PO TABS: 20.0000 mg | ORAL_TABLET | Freq: Every day | ORAL | 11 refills | Status: DC

## 2020-01-05 NOTE — Telephone Encounter (Signed)
Received message that patient was having difficulties obtaining tadalafil for PH. Discovered that patient had been started on Alyq and did not realized that Alyq and tadalafil are equivalent for this indication. Patient had not started Alyq because she thought it was sent to her in error. Informed her that she can go ahead and start Alyq. She obtains Alyq through Therapist, nutritional.   Dawn Fowler, PharmD, BCPS, BCCP, CPP Heart Failure Clinic Pharmacist [phone]

## 2020-01-05 NOTE — Telephone Encounter (Signed)
Called patient, advised scales are in office and she can pick one up. Pt spoke with pharmacy about Tadalafil. (see note from [place]).

## 2020-01-06 ENCOUNTER — Other Ambulatory Visit: Payer: Self-pay

## 2020-01-06 ENCOUNTER — Encounter: Payer: Self-pay | Admitting: Pulmonary Disease

## 2020-01-06 ENCOUNTER — Ambulatory Visit: Payer: Medicare Other | Admitting: Pulmonary Disease

## 2020-01-06 VITALS — BP 106/72 | HR 94 | Temp 98.4°F | Ht 61.0 in | Wt 251.8 lb

## 2020-01-06 DIAGNOSIS — I272 Pulmonary hypertension, unspecified: Secondary | ICD-10-CM

## 2020-01-06 DIAGNOSIS — G4733 Obstructive sleep apnea (adult) (pediatric): Secondary | ICD-10-CM

## 2020-01-06 DIAGNOSIS — Z9989 Dependence on other enabling machines and devices: Secondary | ICD-10-CM | POA: Diagnosis not present

## 2020-01-06 DIAGNOSIS — I509 Heart failure, unspecified: Secondary | ICD-10-CM

## 2020-01-06 MED ORDER — IPRATROPIUM-ALBUTEROL 0.5-2.5 (3) MG/3ML IN SOLN: 3.0000 mL | Freq: Four times a day (QID) | RESPIRATORY_TRACT | 5 refills | Status: DC | PRN

## 2020-01-06 NOTE — Progress Notes (Signed)
Subjective:   PATIENT ID: Dawn Fowler GENDER: female DOB: July 25, 1966, MRN: [id number]   Chief Complaint  Patient presents with  . Consult   Reason for Visit: Follow-up visit  Ms. Dawn Fowler is a 54 year old female never smoker with severe OSA (not on CPAP), recent COVID-19 infection requiring hospitalization (12/30-1/4), stage III CKD  Of note, she reports that she is previously established at the [facility] in [place].  Reviewed note from 11/15/2018 by Dr. Kathrynn Humble, MD in pulmonary for pulmonary hypertension.  She was on Opsumit 10 mg daily, sildenafil 20 mg q8h and Procardia 60 mg daily.  She had a right heart cath done in March 2018 (RA 19 PA 76/40/48 PCWP 20 CO 4.02L/min CI 1.9 No vasodilator trial noted)and August 02 2018 however her numbers are cut off on fax. RVSP 57 and PVR 3.09 [place] units. She has a documented 6-minute walk test demonstrating 276 m on 8 L of O2 with SPO2 of 87%.  During these visits she has discussed bariatric surgery and weight loss.  CT Chest without contrast 07/26/18 -(report only) enlarged MPA and centroal pulmonary arteries consistent with hx of pulmonary hypertension.  Stable predominantly basilar mosaic attenuation reflects either small vessel or small airways disease.  Persistent predominant subcarinal, paraesophageal and bilateral infrahilar lymph node enlargement unchanged from 4 months ago.  Stable small perifissural nodule likely representing intranodular lymph node.  Other concerning parenchymal lesions not seen. -Report does not mention any interstitial lung disease.  VQ scan 07/26/2018 very low likelihood of pulmonary embolism  PFTs 07/18/2018 Difficult to read based on scan-comments state mild to moderate restrictive defect with moderate diffusion impairment.  No significant improvement following albuterol.  Hypoxemic at rest.  Interval  Since our last visit, she has been hospitalized and aggressively diuresed with removal of  30lbs for acute on chronic diastolic heart failure with RV dysfunction secondary to severe pulmonary hypertension Group 2 and 3. Heart failure team restarted Opsumit and tadalafil after diuresis. In-patient PFTs obtained with no evidence of obstruction but reduced FEV1 and FVC and DLCO.  She is compliant with her current CPAP. She continues to have shortness of breath.  Currently on 4L O2 at rest and 6L on exertion. Trying to limit her salt intake. She is monitoring her weight for fluid accumulation.  Social: Unfortunately her mother passed away around that time and that is what prompted the patient eventually moving to [place].  She previously stayed with her son in a town outside [place], [place].  Patient reports now that she has moved to [facility] and is residing here.  Past Medical History:  Diagnosis Date  . Acute respiratory failure due to COVID-19 ([place]) 10/30/2019  . CKD (chronic kidney disease), stage III   . HTN (hypertension)   . Pulmonary HTN ([place])   . Sleep apnea    on CPAP    Outpatient Medications Prior to Visit  Medication Sig Dispense Refill  . albuterol (VENTOLIN HFA) 108 (90 Base) MCG/ACT inhaler Inhale 2 puffs into the lungs every 6 (six) hours as needed for wheezing or shortness of breath. 6.7 g 0  . Cholecalciferol (VITAMIN D-3) 125 MCG (5000 UT) TABS Take 5,000 mg by mouth daily.    . hydrALAZINE (APRESOLINE) 100 MG tablet Take 1 tablet (100 mg total) by mouth every 8 (eight) hours. 180 tablet 0  . NIFEdipine (PROCARDIA XL/NIFEDICAL-XL) 90 MG 24 hr tablet Take 1 tablet (90 mg total) by mouth daily. 60 tablet  0  . OPSUMIT 10 MG tablet Take 1 tablet (10 mg total) by mouth daily. 30 tablet 0  . pantoprazole (PROTONIX) 40 MG tablet Take 1 tablet (40 mg total) by mouth daily. 30 tablet 0  . potassium chloride SA (KLOR-CON) 20 MEQ tablet Take 2 tablets (40 mEq total) by mouth daily. 60 tablet 0  . PREMARIN 0.625 MG tablet Take 0.625 mg by mouth daily.    .  tadalafil, PAH, (ALYQ) 20 MG tablet Take 1 tablet (20 mg total) by mouth daily. 30 tablet 11  . torsemide (DEMADEX) 20 MG tablet Take 40mg  in the AM and 20mg  in the PM     No facility-administered medications prior to visit.    Review of Systems  Constitutional: Negative for chills, diaphoresis, fever, malaise/fatigue and weight loss.  HENT: Negative for congestion.   Respiratory: Positive for shortness of breath. Negative for cough, hemoptysis, sputum production and wheezing.   Cardiovascular: Negative for chest pain, palpitations and leg swelling.     Objective:   Vitals:   01/06/20 1541  BP: 106/72  Pulse: 94  Temp: 98.4 F (36.9 C)  TempSrc: Temporal  SpO2: 96%  Weight: 251 lb 12.8 oz (114.2 kg)  Height: 5\' 1"  (1.549 m)   SpO2: 96 % O2 Device: Nasal cannula O2 Flow Rate (L/min): 6 L/min   Physical Exam: General: Obese, well-appearing, no acute distress HENT: [place], AT Eyes: EOMI, no scleral icterus Respiratory: Diminished breath sounds bilaterally.  No crackles, wheezing or rales Cardiovascular: RRR, -M/R/G, no JVD GI: BS+, soft, nontender Extremities:-Edema,-tenderness Neuro: AAO x4, CNII-XII grossly intact Skin: Intact, no rashes or bruising Psych: Normal mood, normal affect  Data Reviewed:  Imaging:  12/25/2014-VQ scan-mild obstructive airways disease, very low probability of PE 12/18/2014-CTA anterior-no evidence of PE, cardiomegaly, dependent airspace trapping which may be related to morbid obesity or small airways disease ^Unable to view images but can read radiology report  10/29/2019-chest x-ray-marked cardiomegaly with vascular congestion, dilated pulmonary arteries which can be seen in patients with elevated pulmonary pressures, no large focal infiltrate  PFT: 02/11/2015-office spirometry-FVC 2.14 L, FEV1 1.78, ratio 83  12/22/19  FVC 1.43 (48%) FEV1 1.27 (54%) Ratio 87  TLC 64% DLCO 32% Interpretation: No evidence of obstructive defect however  significant response to bronchodilators suggestive of asthma. Co-comitant restrictive lung defect with reduced gas exchange also present.  Echo 12/21/2014 LV ejection fraction 60 to 65%, moderate pulmonary hypertension, moderate diastolic dysfunction    12/22/95 RHC Procedural Findings: Hemodynamics (mmHg) RA mean 14 RV 87/21 PA 89/33, mean 54 PCWP mean 34 Oxygen saturations: SVC 63% PA 61% AO 94% Cardiac Output (Fick) 5.17  Cardiac Index (Fick) 2.22 PVR 3.87 WU Assessment & Plan:   Discussion: 54 year old female with severe pulmonary hypertension secondary to Group II, III and severe OSA who presents for follow-up. Continues to have dyspnea despite improved volume status from better control of her pulmonary HTN  Severe Pulmonary Hypertension Group II, III --Will arrange for Split Night study --Will arrange for nebulizer with Duonebs every 6 hours as needed for shortness of breath or wheezing --Continue medications per Cardiology  OSA on CPAP --Continue CPAP on current settings until sleep study completed  +BD response on PFTs --Continue nebs PRN --Consider ICS/LABA if persistent dyspnea in the future  Health Maintenance Immunization History  Administered Date(s) Administered  . Influenza Whole 08/20/2019  . Influenza,inj,Quad PF,6+ Mos 06/25/2018  . Pneumococcal Polysaccharide-23 07/17/2014   CT Lung Screen -never smoker, would not qualify  I have spent a total time of 31-minutes on the day of the appointment reviewing prior documentation, coordinating care and discussing medical diagnosis and plan with the patient/family. Imaging, labs and tests included in this note have been reviewed and interpreted independently by me.  [place], MD [facility] 01/06/2020 3:59 PM  Office Number [phone]

## 2020-01-06 NOTE — Patient Instructions (Signed)
Severe Pulmonary Hypertension Group II, III --Will arrange for Split Night study --Will arrange for nebulizer with Duonebs every 6 hours as needed for shortness of breath or wheezing --Continue medications per Cardiology  OSA on CPAP --Continue CPAP on current settings until sleep study completed  Follow-up with me 2 months

## 2020-01-08 ENCOUNTER — Telehealth: Payer: Self-pay | Admitting: Pulmonary Disease

## 2020-01-08 DIAGNOSIS — I272 Pulmonary hypertension, unspecified: Secondary | ICD-10-CM

## 2020-01-08 NOTE — Telephone Encounter (Signed)
New order placed for a neb machine with the diagnosis of pulmonary hypertension. Per Dr. Cordelia Pen note Duoneb is for pt's pulm hypertension. Will call DME on 3/12 to inform.

## 2020-01-09 NOTE — Telephone Encounter (Signed)
Called and spoke with [name] in regards to the other diagnoses stated by Dr. Loanne Drilling. [name] stated she would provide Dawn Fowler with the other diagnoses and if anything else was needed they would return call.

## 2020-01-09 NOTE — Telephone Encounter (Signed)
Patient also has chronic respiratory failure and chronic diastolic heart failure in addition to her pulmonary hypertension.

## 2020-01-09 NOTE — Telephone Encounter (Signed)
I called [employer] and spoke with Janett Billow, pulmonary hypertension does not work as a qualifying diagnosis. FYI JE

## 2020-01-13 ENCOUNTER — Telehealth: Payer: Self-pay | Admitting: Pulmonary Disease

## 2020-01-13 ENCOUNTER — Other Ambulatory Visit (HOSPITAL_COMMUNITY): Payer: Medicare Other | Attending: Gastroenterology

## 2020-01-13 DIAGNOSIS — Z8616 Personal history of COVID-19: Secondary | ICD-10-CM

## 2020-01-13 DIAGNOSIS — R918 Other nonspecific abnormal finding of lung field: Secondary | ICD-10-CM

## 2020-01-13 DIAGNOSIS — J410 Simple chronic bronchitis: Secondary | ICD-10-CM

## 2020-01-13 NOTE — Telephone Encounter (Signed)
Unfortunately, none of those diagnoses are on the approved list from [place] as covered diagnoses.  Aaron Edelman is not in office today but I have placed a copy of dx codes on his desk upstairs in office to review when he returns to clinic tomorrow.   I looked through list and saw nothing comparable to the pt's diagnoses.   Aaron Edelman please advise after reviewing list.  Thanks!

## 2020-01-13 NOTE — Telephone Encounter (Signed)
Odd typically those should work. Will address tomorrow when I review the list.   Wyn Quaker FNP

## 2020-01-13 NOTE — Telephone Encounter (Signed)
Dr. Loanne Drilling, I have a list of diagnosis codes that will cover for nebulizer. I will leave a copy in your folder. It looks like it has to be a J-code and pulmonary hypertension or hear failure diagnosis will not work. Please advise.

## 2020-01-13 NOTE — Telephone Encounter (Signed)
Response from [facility]:  Charmian Muff sent to Vella Kohler D  We had already gotten this order - pt does not have any qualifying diagnoses for a nebulizer to be billed to her insurance. Need a dx of a chronic lung disease - COPD, asthma, chronic bronchitis, etc - in order to bill her insurance.

## 2020-01-13 NOTE — Telephone Encounter (Signed)
Triage please send the order for nebulizer.  Can associate to wheezing, pulmonary hypertension, chronic respiratory failure.  These should be more than sufficient specifically the acute diagnosis of wheezing.If there is any issues getting this completed please let me know.  Okay to place order under my name so that way I can sign it.   Wyn Quaker, FNP

## 2020-01-14 NOTE — Telephone Encounter (Signed)
Order has been placed.

## 2020-01-14 NOTE — Telephone Encounter (Signed)
New order has been sent to [place].

## 2020-01-14 NOTE — Telephone Encounter (Signed)
01/14/2020  C,   Can you update this order to reflect the length of need for the nebulizer.  I would assume the nebulizer be needed at least for the next year.  Sorry I have no idea how to do that.  Aaron Edelman

## 2020-01-14 NOTE — Telephone Encounter (Signed)
I have reviewed the list provided from [place].   Please associate order with simple chronic bronchitis, abnormal lung imaging, and history of covid19 infection.   Thank you.   Routing to [place] as FYI.   Wyn Quaker FNP

## 2020-01-14 NOTE — Telephone Encounter (Signed)
Thank you C  Aaron Edelman

## 2020-01-16 ENCOUNTER — Ambulatory Visit (HOSPITAL_COMMUNITY): Admission: RE | Admit: 2020-01-16 | Payer: Medicare Other | Source: Home / Self Care | Admitting: Gastroenterology

## 2020-01-16 ENCOUNTER — Ambulatory Visit: Payer: Medicare Other

## 2020-01-16 SURGERY — COLONOSCOPY WITH PROPOFOL
Anesthesia: Monitor Anesthesia Care

## 2020-01-19 ENCOUNTER — Telehealth: Payer: Self-pay | Admitting: Pulmonary Disease

## 2020-01-19 NOTE — Telephone Encounter (Signed)
Called and spoke to pt. Pt states she hasnt received her nebulizer machine. Order was placed on 01/14/20. Pt states she has a couple missed calls from [employer]. Advised pt to call them and to call us back to update [place] if all has been taken care of. Will keep message open to follow up on.

## 2020-01-20 ENCOUNTER — Telehealth: Payer: Self-pay | Admitting: Pulmonary Disease

## 2020-01-20 ENCOUNTER — Other Ambulatory Visit (HOSPITAL_COMMUNITY): Payer: Medicare Other

## 2020-01-20 ENCOUNTER — Other Ambulatory Visit (HOSPITAL_COMMUNITY): Payer: Self-pay | Admitting: *Deleted

## 2020-01-20 NOTE — Telephone Encounter (Signed)
I understand what he is saying , can the split night not be done on her baseline oxygen setting as she has underlying pulmonary HTN .  If not then will need to due full PSG (split night is nice bc get CPAP titration as well) .

## 2020-01-20 NOTE — Telephone Encounter (Signed)
LMTCB x1 for [facility] with the sleep center.

## 2020-01-20 NOTE — Progress Notes (Signed)
Patient tested positive for COVID on 10/29/19 for COVID, result posited (also located in Media Tab (10/29/19)) no retest before her procedure on 01/22/20.  Patient is within the 90 day window of not needed a retest before procedure on 01/22/20   Media Information   Document Information  Photos    10/29/2019 20:39  Attached To:  Hospital Encounter on 10/29/19  Source Information  Rancour, Annie Main, MD  [facility]

## 2020-01-20 NOTE — Telephone Encounter (Signed)
Pt called back stating she will be picking up the nebulizer tomorrow since DME company has no driver's at the moment.  Also stated that she has been wheezing x2 days, hoping the nebulizer will help. Please advise. [phone]

## 2020-01-20 NOTE — Telephone Encounter (Signed)
Spoke with pt. She states that she will be getting her nebulizer machine tomorrow. In regards to her wheezing, she reports that this is nothing out of the ordinary for her. Nothing further was needed.

## 2020-01-20 NOTE — Telephone Encounter (Signed)
Spoke with Lynnae Sandhoff at the sleep center. States that he is worried that the pt will not be able to tolerate a split night study due to her using 8L/min 24/7. Lynnae Sandhoff contacted the pt and she the pt doesn't feel like she would be able to use anything lower than 8L. He is wanting to know if the order needs to be changed to just a regular PSG. Pt's split night is currently scheduled for 01/22/20.  Tammy - please advise as Dr. Loanne Drilling is not here. Thanks!

## 2020-01-21 ENCOUNTER — Encounter: Payer: Self-pay | Admitting: Pulmonary Disease

## 2020-01-21 NOTE — Telephone Encounter (Signed)
LMTCB x2 for [employer].

## 2020-01-21 NOTE — Telephone Encounter (Signed)
Spoke with [employer]. There was a miscommunication, he is not wanting to change the order. Lynnae Sandhoff will do the pt's split night study on her baseline oxygen needs. Nothing further was needed.

## 2020-01-22 ENCOUNTER — Encounter (HOSPITAL_BASED_OUTPATIENT_CLINIC_OR_DEPARTMENT_OTHER): Payer: Medicare Other | Admitting: Pulmonary Disease

## 2020-01-22 ENCOUNTER — Telehealth: Payer: Self-pay | Admitting: Pulmonary Disease

## 2020-01-22 NOTE — Telephone Encounter (Signed)
Spoke with the pt.  She is asking about when the appropriate time to use her neb machine is.  I advised to only use this as needed for SOB or wheezing.  She verbalized understanding and nothing further needed.

## 2020-01-29 ENCOUNTER — Other Ambulatory Visit: Payer: Self-pay | Admitting: Family Medicine

## 2020-01-29 ENCOUNTER — Ambulatory Visit
Admission: RE | Admit: 2020-01-29 | Discharge: 2020-01-29 | Disposition: A | Discharge status: Home / Self Care | Payer: Medicare Other | Source: Ambulatory Visit | Attending: Family Medicine | Admitting: Family Medicine

## 2020-01-29 DIAGNOSIS — M1990 Unspecified osteoarthritis, unspecified site: Secondary | ICD-10-CM

## 2020-02-02 ENCOUNTER — Ambulatory Visit: Payer: Medicare Other | Attending: Internal Medicine

## 2020-02-02 DIAGNOSIS — Z23 Encounter for immunization: Secondary | ICD-10-CM

## 2020-02-02 NOTE — Progress Notes (Signed)
   Covid-19 Vaccination Clinic  Name:  Dawn Fowler    MRN: [id number] DOB: 1966/10/07  02/02/2020  Dawn Fowler was observed post Covid-19 immunization for 15 minutes without incident. She was provided with Vaccine Information Sheet and instruction to access the V-Safe system.   Dawn Fowler was instructed to call 911 with any severe reactions post vaccine: Marland Kitchen Difficulty breathing  . Swelling of face and throat  . A fast heartbeat  . A bad rash all over body  . Dizziness and weakness   Immunizations Administered    Name Date Dose VIS Date Route   Pfizer COVID-19 Vaccine 02/02/2020 11:52 AM 0.3 mL 10/10/2019 Intramuscular   Manufacturer: [place]   Lot: [id number]   [place]: [id number]

## 2020-02-05 ENCOUNTER — Ambulatory Visit (HOSPITAL_COMMUNITY)
Admission: RE | Admit: 2020-02-05 | Discharge: 2020-02-05 | Disposition: A | Discharge status: Home / Self Care | Payer: Medicare Other | Source: Ambulatory Visit | Attending: Cardiology | Admitting: Cardiology

## 2020-02-05 ENCOUNTER — Encounter (HOSPITAL_COMMUNITY): Payer: Self-pay

## 2020-02-05 ENCOUNTER — Other Ambulatory Visit: Payer: Self-pay

## 2020-02-05 VITALS — BP 158/102 | HR 94 | Wt 250.4 lb

## 2020-02-05 DIAGNOSIS — I10 Essential (primary) hypertension: Secondary | ICD-10-CM

## 2020-02-05 DIAGNOSIS — Z86718 Personal history of other venous thrombosis and embolism: Secondary | ICD-10-CM | POA: Diagnosis not present

## 2020-02-05 DIAGNOSIS — I2721 Secondary pulmonary arterial hypertension: Secondary | ICD-10-CM | POA: Diagnosis not present

## 2020-02-05 DIAGNOSIS — Z79899 Other long term (current) drug therapy: Secondary | ICD-10-CM | POA: Insufficient documentation

## 2020-02-05 DIAGNOSIS — I50810 Right heart failure, unspecified: Secondary | ICD-10-CM

## 2020-02-05 DIAGNOSIS — I272 Pulmonary hypertension, unspecified: Secondary | ICD-10-CM

## 2020-02-05 DIAGNOSIS — Z87891 Personal history of nicotine dependence: Secondary | ICD-10-CM | POA: Diagnosis not present

## 2020-02-05 DIAGNOSIS — G4733 Obstructive sleep apnea (adult) (pediatric): Secondary | ICD-10-CM | POA: Insufficient documentation

## 2020-02-05 DIAGNOSIS — I130 Hypertensive heart and chronic kidney disease with heart failure and stage 1 through stage 4 chronic kidney disease, or unspecified chronic kidney disease: Secondary | ICD-10-CM | POA: Diagnosis not present

## 2020-02-05 DIAGNOSIS — Z8249 Family history of ischemic heart disease and other diseases of the circulatory system: Secondary | ICD-10-CM | POA: Insufficient documentation

## 2020-02-05 DIAGNOSIS — N1832 Chronic kidney disease, stage 3b: Secondary | ICD-10-CM | POA: Diagnosis not present

## 2020-02-05 DIAGNOSIS — I5032 Chronic diastolic (congestive) heart failure: Secondary | ICD-10-CM | POA: Diagnosis present

## 2020-02-05 DIAGNOSIS — Z8616 Personal history of COVID-19: Secondary | ICD-10-CM | POA: Diagnosis not present

## 2020-02-05 MED ORDER — SPIRONOLACTONE 25 MG PO TABS: 12.5000 mg | ORAL_TABLET | Freq: Every day | ORAL | 1 refills | Status: DC

## 2020-02-05 MED ORDER — SPIRONOLACTONE 25 MG PO TABS: 12.5000 mg | ORAL_TABLET | Freq: Every day | ORAL | 3 refills | Status: DC

## 2020-02-05 NOTE — Patient Instructions (Signed)
START Spironolactone 12.5mg  (1/2 tab) daily   TAKE an extra Torsemide 20mg  (1 tab) for 2 days, THEN go back to normal daily dose   Labs in 1 week We will only contact you if something comes back abnormal or we need to make some changes. Otherwise no news is good news!   Your physician recommends that you schedule a follow-up appointment in: 6 weeks with the Nurse Practitioner  Please call office at [phone] option 2 if you have any questions or concerns.   At the [facility], you and your health needs are our priority. As part of our continuing mission to provide you with exceptional heart care, we have created designated Provider Care Teams. These Care Teams include your primary Cardiologist (physician) and Advanced Practice Providers (APPs- Physician Assistants and Nurse Practitioners) who all work together to provide you with the care you need, when you need it.   You may see any of the following providers on your designated Care Team at your next follow up: Marland Kitchen Dr Glori Bickers . Dr [name] . Darrick Grinder, NP . Lyda Jester, PA . Audry Riles, PharmD   Please be sure to bring in all your medications bottles to every appointment.

## 2020-02-05 NOTE — Progress Notes (Signed)
Pt asked to have medication Arlyce Harman routed to [employer], completed.

## 2020-02-05 NOTE — Progress Notes (Signed)
Advanced Heart Failure Clinic Note   PCP: Hayden Rasmussen, MD PCP-Cardiologist: No primary care provider on file.  AHF: Dr. Aundra Dubin   HPI: 54 y/o AAF, presenting to clinic for post hospital f/u after recent admission for a/c diastolic HF w/ volume overload, in the setting of pulmonary hypertension.   She has a long history of severe OSA, and suspect OHS/OSA.  She is on CPAP at home and 8L home oxygen (says she has been on oxygen for "years.") She recently moved to [place].  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  She additionally has significant HTN.  She had been on Opsumit 10 + tadalafil 20 mg daily.  She ran out a couple of weeks prior to being admitted to [facility]. She also has a history of prior DVT after long train ride, has had 1 or 2 negative V/Q scans.  She was hospitalized for COVID-19 at [facility] in 1/21 and had been more SOB since that time.   She was seen by [facility]monology post discharge and was noted to be volume overloaded, Lasix was changed to torsemide but she was unable to get to pharmacy to pick up. Subsequently, she developed progressive wt gain, nearly 20 lb and increased dyspnea, prompting her to report back to the ED, where she was found to be in acute on chronic diastolic HF and readmitted for IV diuresis and AHF consultation.   Echo with LVEF 55-60%, moderate RV dilation/moderate RV dysfunction.  [place] 2/18 showed elevated left and right heart filling pressures with severe pulmonary hypertension. PFTs completed showing severe restriction and severely decreased DLCO. High Resolution CT- concerning  for small vessel disease, no ILD, and concerning for PAH.  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 She was  continued on IV diuretics and had good response, diuresing 32 lb. She was transitioned to torsemide 40 mg/20 mg. Opsumit and tadalafil retsarted. She was advised to continue w/ supp O2 and CPAP.   Today she returns for HF follow up.Overall feeling ok. Remains on 8 liters [place] with exertion and 4 liters at rest. Mild SOB with exertion. Denies PND/Orthopnea. Denies syncope/presyncope. using CPAP at night.  Appetite ok. Had pizza today. No fever or chills. Weight at home 248 pounds. Taking all medications. Currently not working. On disability. Lives alone.   Past Medical History:  Diagnosis Date  . Acute respiratory failure due to COVID-19 ([place]) 10/30/2019  . CKD (chronic kidney disease), stage III   . COVID-19 virus detected 11/20/2019   Tested positive for Covid in December/2020 Required hospitalization in December/2020 through January/2021 Received Redemsivir  . HTN (hypertension)   . Pulmonary HTN ([place])   . Sleep apnea    on CPAP    Current Outpatient Medications  Medication Sig Dispense Refill  . albuterol (VENTOLIN HFA) 108 (90 Base) MCG/ACT inhaler Inhale 2 puffs into the lungs every 6 (six) hours as needed for wheezing or shortness of breath. 6.7 g 0  . Cholecalciferol (VITAMIN D-3) 125 MCG (5000 UT) TABS Take 5,000 mg by mouth daily.    Marland Kitchen gabapentin (NEURONTIN) 100 MG capsule Take 300 mg by mouth daily.    . hydrALAZINE (APRESOLINE) 100 MG tablet Take 1 tablet (100 mg total) by mouth every  8 (eight) hours. 180 tablet 0  . ipratropium-albuterol (DUONEB) 0.5-2.5 (3) MG/3ML SOLN Take 3 mLs by nebulization every 6 (six) hours as needed. 360 mL 5  . Multiple Vitamin (MULTIVITAMIN) tablet Take 1 tablet by mouth daily.    Marland Kitchen NIFEdipine (PROCARDIA XL/NIFEDICAL-XL) 90 MG 24 hr tablet Take 1 tablet (90 mg total) by mouth daily. 60 tablet 0  . PREMARIN 0.625 MG tablet Take 0.625 mg by mouth daily.    . tadalafil, PAH, (ALYQ) 20 MG tablet Take 1 tablet (20 mg total) by mouth daily. 30 tablet 11  .  torsemide (DEMADEX) 20 MG tablet Take 40mg  in the AM and 20mg  in the PM    . potassium chloride SA (KLOR-CON) 20 MEQ tablet Take 2 tablets (40 mEq total) by mouth daily. 60 tablet 0   No current facility-administered medications for this encounter.    Allergies  Allergen Reactions  . Adhesive [Tape] Hives    Paper tape  . Succinylcholine Other (See Comments)    Have trouble waking up  . Tizanidine Hives      Social History   Socioeconomic History  . Marital status: Single    Spouse name: Not on file  . Number of children: Not on file  . Years of education: Not on file  . Highest education level: Not on file  Occupational History  . Not on file  Tobacco Use  . Smoking status: Former Research scientist (life sciences)  . Smokeless tobacco: Never Used  . Tobacco comment: only smoked for fun in highschool not even one a day  Substance and Sexual Activity  . Alcohol use: Not Currently  . Drug use: Not Currently  . Sexual activity: Not on file  Other Topics Concern  . Not on file  Social History Narrative  . Not on file   Social Determinants of Health   Financial Resource Strain:   . Difficulty of Paying Living Expenses:   Food Insecurity:   . Worried About Charity fundraiser in the Last Year:   . Arboriculturist in the Last Year:   Transportation Needs:   . Film/video editor (Medical):   Marland Kitchen Lack of Transportation (Non-Medical):   Physical Activity:   . Days of Exercise per Week:   . Minutes of Exercise per Session:   Stress:   . Feeling of Stress :   Social Connections:   . Frequency of Communication with Friends and Family:   . Frequency of Social Gatherings with Friends and Family:   . Attends Religious Services:   . Active Member of Clubs or Organizations:   . Attends Archivist Meetings:   Marland Kitchen Marital Status:   Intimate Partner Violence:   . Fear of Current or Ex-Partner:   . Emotionally Abused:   Marland Kitchen Physically Abused:   . Sexually Abused:       Family History    Problem Relation Age of Onset  . COPD Father   . Hypertension Brother   . Pulmonary Hypertension Neg Hx     Vitals:   02/05/20 1400  BP: (!) 158/102  Pulse: 94  SpO2: 94%  Weight: 113.6 kg (250 lb 6.4 oz)     PHYSICAL EXAM: General:  Walked in the clinic with a rolling walker.  No resp difficulty HEENT: normal Neck: supple. no JVD. Carotids 2+ bilat; no bruits. No lymphadenopathy or thryomegaly appreciated. Cor: PMI nondisplaced. Regular rate & rhythm. No rubs, gallops or murmurs. Lungs: clear on 8 liters [place] Abdomen:  soft, nontender, nondistended. No hepatosplenomegaly. No bruits or masses. Good bowel sounds. Extremities: no cyanosis, clubbing, rash, R and LLE trace-1+  Neuro: alert & orientedx3, cranial nerves grossly intact. moves all 4 extremities w/o difficulty. Affect pleasant    ASSESSMENT & PLAN:  1. Chronic diastolic CHF with significant RV dysfunction: Echo with LVEF 55-60%, moderate RV dilation/moderate RV dysfunction.  [place] 2/18 showed elevated left and right heart filling pressures with severe pulmonary hypertension.  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  -NYHA III.  - Volume overloaded in the setting high sodium diet. Discussed low salt diet and limiting fluid to < 2 liters per day.  -  Continue torsemide 40 mg/20 mg and take extra 20 mg torsemide for 2 days.  -Check BMET in 7 days   2. Pulmonary hypertension: suspect that this is a mixed picture with group 2 and group 3 PH (related to diastolic LV dysfunction and OHS/OSA).  Cannot fully rule out group 1 PH. V/Q scan not suggestive of chronic PEs and CT chest in 2019 was not suggestive of ILD. HIV and rheumatologic serologies negative. 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  - PFTs completed => severe restriction, severely decreased DLCO.  -High Resolution CT- concerning  for small vessel disease, no  ILD, and concerning for PAH.   - Suspect that the key to treatment will be maintaining oxygen saturation with home O2 and CPAP. Weight loss also would be helpful.  - Continue Opsumit 10 mg daily. - Continue tadalafil 20 mg daily   3. HTN:  Elevated. Add 12.5 mg spironolactone daily.  - Check BMET in 7 days  4. CKD stage 3b: baseline SCr ~2  5. OSA: compliant w/ CPAP - Continue nightly.   Follow up in 6 weeks to reassess.      Darrick Grinder, NP 02/05/20

## 2020-02-11 ENCOUNTER — Ambulatory Visit: Payer: Medicare Other

## 2020-02-12 ENCOUNTER — Other Ambulatory Visit: Payer: Self-pay

## 2020-02-12 ENCOUNTER — Ambulatory Visit (HOSPITAL_COMMUNITY)
Admission: RE | Admit: 2020-02-12 | Discharge: 2020-02-12 | Disposition: A | Discharge status: Home / Self Care | Payer: Medicare Other | Source: Ambulatory Visit | Attending: Cardiology | Admitting: Cardiology

## 2020-02-12 DIAGNOSIS — I272 Pulmonary hypertension, unspecified: Secondary | ICD-10-CM | POA: Insufficient documentation

## 2020-02-12 LAB — BASIC METABOLIC PANEL
Anion gap: 10 (ref 5–15)
BUN: 19 mg/dL (ref 6–20)
CO2: 20 mmol/L — ABNORMAL LOW (ref 22–32)
Calcium: 9.4 mg/dL (ref 8.9–10.3)
Chloride: 109 mmol/L (ref 98–111)
Creatinine, Ser: 1.66 mg/dL — ABNORMAL HIGH (ref 0.44–1.00)
GFR calc Af Amer: 40 mL/min — ABNORMAL LOW (ref >60)
GFR calc non Af Amer: 35 mL/min — ABNORMAL LOW (ref >60)
Glucose, Bld: 96 mg/dL (ref 70–99)
Potassium: 4.4 mmol/L (ref 3.5–5.1)
Sodium: 139 mmol/L (ref 135–145)

## 2020-02-13 ENCOUNTER — Ambulatory Visit: Payer: Medicare Other | Admitting: Obstetrics & Gynecology

## 2020-02-13 ENCOUNTER — Encounter: Payer: Self-pay | Admitting: Obstetrics & Gynecology

## 2020-02-13 VITALS — BP 134/86 | Ht 60.25 in | Wt 253.0 lb

## 2020-02-13 DIAGNOSIS — Z124 Encounter for screening for malignant neoplasm of cervix: Secondary | ICD-10-CM

## 2020-02-13 DIAGNOSIS — Z113 Encounter for screening for infections with a predominantly sexual mode of transmission: Secondary | ICD-10-CM | POA: Diagnosis not present

## 2020-02-13 DIAGNOSIS — N950 Postmenopausal bleeding: Secondary | ICD-10-CM | POA: Diagnosis not present

## 2020-02-13 NOTE — Patient Instructions (Signed)
1. Postmenopausal bleeding Very mild postmenopausal bleeding 2 weeks ago.  Menopause x2 years.  No hormone replacement therapy.  Obesity with body mass index at 49.  History of fibroids.  Gynecologic exam normal today.  Pap test with gonorrhea and Chlamydia done.  We will further investigate with a pelvic ultrasound at follow-up.  Possible endometrial biopsy per findings.  Patient agrees with management plan. - Pap reflex/Gono-Chlam done - US Transvaginal Non-OB; Future  Dawn Fowler, it was a pleasure meeting you today!

## 2020-02-13 NOTE — Progress Notes (Signed)
    Dawn Fowler Jul 01, 1966 [id number]   History:    54 y.o. G1P1L1 Son by C-section.  Stable boyfriend x 10 yrs  RP:  New patient presenting for Postmenopausal bleeding 2 weeks ago  HPI: Postmenopausal x2 years with hot flushes and night sweats when entered menopause.  Experienced mild pelvic cramping with dark spotting vaginally 2 weeks ago.  No recurrence of bleeding since then.  No current pelvic pain.  Sexually active with her boyfriend.  No pain with intercourse.  No abnormal vaginal discharge.  No fever.  Per patient, history of fibroids.  Urine and bowel movements normal.  Body mass index 49.  Organizing her screening mammogram and colonoscopy.  Past medical history,surgical history, family history and social history were all reviewed and documented in the EPIC chart.  Gynecologic History No LMP recorded. Patient is postmenopausal.  Obstetric History OB History  Gravida Para Term Preterm AB Living  1 1       1   SAB TAB Ectopic Multiple Live Births               # Outcome Date GA Lbr Len/2nd Weight Sex Delivery Anes PTL Lv  1 Para              ROS: A ROS was performed and pertinent positives and negatives are included in the history.  GENERAL: No fevers or chills. HEENT: No change in vision, no earache, sore throat or sinus congestion. NECK: No pain or stiffness. CARDIOVASCULAR: No chest pain or pressure. No palpitations. PULMONARY: No shortness of breath, cough or wheeze. GASTROINTESTINAL: No abdominal pain, nausea, vomiting or diarrhea, melena or bright red blood per rectum. GENITOURINARY: No urinary frequency, urgency, hesitancy or dysuria. MUSCULOSKELETAL: No joint or muscle pain, no back pain, no recent trauma. DERMATOLOGIC: No rash, no itching, no lesions. ENDOCRINE: No polyuria, polydipsia, no heat or cold intolerance. No recent change in weight. HEMATOLOGICAL: No anemia or easy bruising or bleeding. NEUROLOGIC: No headache, seizures, numbness, tingling or weakness.  PSYCHIATRIC: No depression, no loss of interest in normal activity or change in sleep pattern.     Exam:   BP 134/86   Ht 5' 0.25" (1.53 m)   Wt 253 lb (114.8 kg)   BMI 49.00 kg/m   Body mass index is 49 kg/m.  General appearance : Well developed well nourished female. No acute distress  Abdomen: normal  Pelvic: Vulva: Normal             Vagina: No gross lesions or discharge  Cervix: No gross lesions or discharge.  Pap reflex/Gono-Chlam done.  Uterus  AV, normal size, shape and consistency, non-tender and mobile  Adnexa  Without masses or tenderness  Anus: Normal   Assessment/Plan:  54 y.o.   1. Postmenopausal bleeding Very mild postmenopausal bleeding 2 weeks ago.  Menopause x2 years.  No hormone replacement therapy.  Obesity with body mass index at 49.  History of fibroids.  Gynecologic exam normal today.  Pap test with gonorrhea and Chlamydia done.  We will further investigate with a pelvic ultrasound at follow-up.  Possible endometrial biopsy per findings.  Patient agrees with management plan. - Pap reflex/Gono-Chlam done - US Transvaginal Non-OB; Future  Princess Bruins MD, 12:22 PM 02/13/2020

## 2020-02-13 NOTE — Addendum Note (Signed)
Addended by: Thurnell Garbe A on: 02/13/2020 12:39 PM   Modules accepted: Orders

## 2020-02-17 LAB — PAP THINPREP ASCUS RFLX HPV RFLX TYPE
C. trachomatis RNA, TMA: NOT DETECTED
N. gonorrhoeae RNA, TMA: NOT DETECTED

## 2020-02-18 ENCOUNTER — Other Ambulatory Visit: Payer: Self-pay | Admitting: Gastroenterology

## 2020-02-24 ENCOUNTER — Other Ambulatory Visit: Payer: Self-pay | Admitting: Family Medicine

## 2020-02-24 ENCOUNTER — Other Ambulatory Visit: Payer: Self-pay

## 2020-02-24 ENCOUNTER — Ambulatory Visit
Admission: RE | Admit: 2020-02-24 | Discharge: 2020-02-24 | Disposition: A | Discharge status: Home / Self Care | Payer: Medicare Other | Source: Ambulatory Visit | Attending: Family Medicine | Admitting: Family Medicine

## 2020-02-24 ENCOUNTER — Ambulatory Visit: Payer: Medicare Other | Attending: Internal Medicine

## 2020-02-24 DIAGNOSIS — Z23 Encounter for immunization: Secondary | ICD-10-CM

## 2020-02-24 DIAGNOSIS — N63 Unspecified lump in unspecified breast: Secondary | ICD-10-CM

## 2020-02-24 NOTE — Progress Notes (Signed)
   Covid-19 Vaccination Clinic  Name:  Dawn Fowler    MRN: [id number] DOB: 07/10/1966  02/24/2020  [name] was observed post Covid-19 immunization for 15 minutes without incident. She was provided with Vaccine Information Sheet and instruction to access the V-Safe system.   [name] was instructed to call 911 with any severe reactions post vaccine: Marland Kitchen Difficulty breathing  . Swelling of face and throat  . A fast heartbeat  . A bad rash all over body  . Dizziness and weakness   Immunizations Administered    Name Date Dose VIS Date Route   Pfizer COVID-19 Vaccine 02/24/2020  3:08 PM 0.3 mL 12/24/2018 Intramuscular   Manufacturer: [place]   Lot: [id number]   [place]: [id number]

## 2020-02-26 ENCOUNTER — Other Ambulatory Visit: Payer: Medicare Other

## 2020-03-03 ENCOUNTER — Other Ambulatory Visit: Payer: Self-pay

## 2020-03-04 ENCOUNTER — Ambulatory Visit (INDEPENDENT_AMBULATORY_CARE_PROVIDER_SITE_OTHER): Payer: Medicare Other

## 2020-03-04 ENCOUNTER — Ambulatory Visit (INDEPENDENT_AMBULATORY_CARE_PROVIDER_SITE_OTHER): Payer: Medicare Other | Admitting: Obstetrics & Gynecology

## 2020-03-04 DIAGNOSIS — N950 Postmenopausal bleeding: Secondary | ICD-10-CM

## 2020-03-04 DIAGNOSIS — N840 Polyp of corpus uteri: Secondary | ICD-10-CM | POA: Diagnosis not present

## 2020-03-04 NOTE — Progress Notes (Signed)
    Dawn Fowler 1966-02-22 [id number]        54 y.o.  G1P1L1   RP: PMB for Pelvic US  HPI: No PMB currently, no pelvic pain.  On 02/13/2020 we noted: Postmenopausal x2 years with hot flushes and night sweats when entered menopause.  Experienced mild pelvic cramping with dark spotting vaginally 2 weeks ago.  No recurrence of bleeding since then.  No current pelvic pain.  Sexually active with her boyfriend.  No pain with intercourse.  No abnormal vaginal discharge.  No fever.  Per patient, history of fibroids.  Urine and bowel movements normal.   OB History  Gravida Para Term Preterm AB Living  1 1       1   SAB TAB Ectopic Multiple Live Births               # Outcome Date GA Lbr Len/2nd Weight Sex Delivery Anes PTL Lv  1 Para             Past medical history,surgical history, problem list, medications, allergies, family history and social history were all reviewed and documented in the EPIC chart.   Directed ROS with pertinent positives and negatives documented in the history of present illness/assessment and plan.  Exam:  There were no vitals filed for this visit. General appearance:  Normal  Pelvic US today: T/V images.  Anteverted uterus normal in size and shape with 3 small intramural fibroids all below 1 cm in diameter.  The overall uterine size is measured at 8.89 x 5.07 x 4.54 cm.  The endometrial lining is thickened measured at 9.83 mm with cystic changes and a feeder vessel noted to the area of thickening.  3D imaging suggest a 1.8 x 1.4 cm endometrial mass compatible with a polyp.  Both ovaries are small with atrophic appearance.  No adnexal mass.  No free fluid in the posterior cul-de-sac.   Assessment/Plan:  54 y.o. G1P1   1. Postmenopausal bleeding Postmenopausal, on no hormone replacement therapy.  Postmenopausal bleeding early April.  Pelvic ultrasound findings thoroughly reviewed with patient today.  Ovaries normal with no free fluid.  The uterus is overall normal  in size with 3 small intramural fibroids all below 1 cm in diameter.  The endometrial lining is thickened measured at 9.83 mm with cystic changes and a feeder vessel noted.  On 3D imaging a 1.8 x 1.4 cm endometrial mass compatible with a polyp is present.  Decision to proceed with a hysteroscopy, MyoSure excision and D&C.  Pamphlet given.  We will follow-up for preop visit.  2. Endometrial polyp Schedule hysteroscopy with MyoSure excision of polyp and D&C.  Information and pamphlet on hysteroscopy given.  We will follow-up for preop visit possibly a televisit.  Princess Bruins MD, 4:18 PM 03/04/2020

## 2020-03-09 ENCOUNTER — Other Ambulatory Visit (HOSPITAL_COMMUNITY)
Admission: RE | Admit: 2020-03-09 | Discharge: 2020-03-09 | Disposition: A | Discharge status: Home / Self Care | Payer: Medicare Other | Source: Ambulatory Visit | Attending: Gastroenterology | Admitting: Gastroenterology

## 2020-03-09 ENCOUNTER — Encounter: Payer: Self-pay | Admitting: Obstetrics & Gynecology

## 2020-03-09 DIAGNOSIS — Z20822 Contact with and (suspected) exposure to covid-19: Secondary | ICD-10-CM | POA: Insufficient documentation

## 2020-03-09 DIAGNOSIS — Z01812 Encounter for preprocedural laboratory examination: Secondary | ICD-10-CM | POA: Insufficient documentation

## 2020-03-09 LAB — SARS CORONAVIRUS 2 (TAT 6-24 HRS): SARS Coronavirus 2: NEGATIVE

## 2020-03-09 NOTE — Patient Instructions (Addendum)
1. Postmenopausal bleeding Postmenopausal, on no hormone replacement therapy.  Postmenopausal bleeding early April.  Pelvic ultrasound findings thoroughly reviewed with patient today.  Ovaries normal with no free fluid.  The uterus is overall normal in size with 3 small intramural fibroids all below 1 cm in diameter.  The endometrial lining is thickened measured at 9.83 mm with cystic changes and a feeder vessel noted.  On 3D imaging a 1.8 x 1.4 cm endometrial mass compatible with a polyp is present.  Decision to proceed with a hysteroscopy, MyoSure excision and D&C.  Pamphlet given.  We will follow-up for preop visit.  2. Endometrial polyp Schedule hysteroscopy with MyoSure excision/D+C. Information and pamphlet on hysteroscopy given.  We will follow-up for preop visit possibly a televisit.  Claudell, it was a pleasure seeing you today!

## 2020-03-12 ENCOUNTER — Ambulatory Visit (HOSPITAL_COMMUNITY): Payer: Medicare Other | Admitting: Anesthesiology

## 2020-03-12 ENCOUNTER — Encounter: Payer: Self-pay | Admitting: Anesthesiology

## 2020-03-12 ENCOUNTER — Ambulatory Visit (HOSPITAL_COMMUNITY)
Admission: RE | Admit: 2020-03-12 | Discharge: 2020-03-12 | Disposition: A | Discharge status: Home / Self Care | Payer: Medicare Other | Attending: Gastroenterology | Admitting: Gastroenterology

## 2020-03-12 ENCOUNTER — Encounter (HOSPITAL_COMMUNITY)
Admission: RE | Disposition: A | Discharge status: Home / Self Care | Payer: Self-pay | Source: Home / Self Care | Attending: Gastroenterology

## 2020-03-12 ENCOUNTER — Encounter (HOSPITAL_COMMUNITY): Payer: Self-pay | Admitting: Gastroenterology

## 2020-03-12 ENCOUNTER — Other Ambulatory Visit: Payer: Self-pay

## 2020-03-12 DIAGNOSIS — I272 Pulmonary hypertension, unspecified: Secondary | ICD-10-CM | POA: Diagnosis not present

## 2020-03-12 DIAGNOSIS — N183 Chronic kidney disease, stage 3 unspecified: Secondary | ICD-10-CM | POA: Insufficient documentation

## 2020-03-12 DIAGNOSIS — G4730 Sleep apnea, unspecified: Secondary | ICD-10-CM | POA: Insufficient documentation

## 2020-03-12 DIAGNOSIS — I129 Hypertensive chronic kidney disease with stage 1 through stage 4 chronic kidney disease, or unspecified chronic kidney disease: Secondary | ICD-10-CM | POA: Diagnosis not present

## 2020-03-12 DIAGNOSIS — D122 Benign neoplasm of ascending colon: Secondary | ICD-10-CM | POA: Insufficient documentation

## 2020-03-12 DIAGNOSIS — Z888 Allergy status to other drugs, medicaments and biological substances status: Secondary | ICD-10-CM | POA: Insufficient documentation

## 2020-03-12 DIAGNOSIS — Z87891 Personal history of nicotine dependence: Secondary | ICD-10-CM | POA: Insufficient documentation

## 2020-03-12 DIAGNOSIS — Z8616 Personal history of COVID-19: Secondary | ICD-10-CM | POA: Diagnosis not present

## 2020-03-12 DIAGNOSIS — Z1211 Encounter for screening for malignant neoplasm of colon: Secondary | ICD-10-CM | POA: Insufficient documentation

## 2020-03-12 DIAGNOSIS — J984 Other disorders of lung: Secondary | ICD-10-CM | POA: Diagnosis not present

## 2020-03-12 HISTORY — DX: Other complications of anesthesia, initial encounter: T88.59XA

## 2020-03-12 HISTORY — PX: COLONOSCOPY WITH PROPOFOL: SHX5780

## 2020-03-12 HISTORY — DX: Family history of other specified conditions: Z84.89

## 2020-03-12 HISTORY — PX: POLYPECTOMY: SHX5525

## 2020-03-12 HISTORY — PX: HEMOSTASIS CLIP PLACEMENT: SHX6857

## 2020-03-12 SURGERY — COLONOSCOPY WITH PROPOFOL
Anesthesia: Monitor Anesthesia Care

## 2020-03-12 MED ORDER — LACTATED RINGERS IV SOLN
INTRAVENOUS | Status: DC | PRN
Start: 2020-03-12 — End: 2020-03-12

## 2020-03-12 MED ORDER — PROPOFOL 10 MG/ML IV BOLUS
INTRAVENOUS | Status: AC
  Filled 2020-03-12: qty 20

## 2020-03-12 MED ORDER — SODIUM CHLORIDE 0.9 % IV SOLN: INTRAVENOUS | Status: DC

## 2020-03-12 MED ORDER — PROPOFOL 500 MG/50ML IV EMUL
INTRAVENOUS | Status: AC
  Filled 2020-03-12: qty 50

## 2020-03-12 MED ORDER — PROPOFOL 500 MG/50ML IV EMUL
INTRAVENOUS | Status: DC | PRN
  Administered 2020-03-12: 100 ug/kg/min via INTRAVENOUS

## 2020-03-12 MED ORDER — LIDOCAINE 2% (20 MG/ML) 5 ML SYRINGE
INTRAMUSCULAR | Status: DC | PRN
  Administered 2020-03-12: 100 mg via INTRAVENOUS

## 2020-03-12 MED ORDER — PROPOFOL 10 MG/ML IV BOLUS
INTRAVENOUS | Status: DC | PRN
  Administered 2020-03-12 (×4): 20 mg via INTRAVENOUS

## 2020-03-12 SURGICAL SUPPLY — 21 items

## 2020-03-12 NOTE — Anesthesia Procedure Notes (Signed)
Date/Time: 03/12/2020 8:29 AM Performed by: Sharlette Dense, CRNA Oxygen Delivery Method: Simple face mask

## 2020-03-12 NOTE — Anesthesia Preprocedure Evaluation (Addendum)
Anesthesia Evaluation  Patient identified by MRN, date of birth, ID band Patient awake    Reviewed: Allergy & Precautions, NPO status , Patient's Chart, lab work & pertinent test results  History of Anesthesia Complications (+) Family history of anesthesia reaction and history of anesthetic complications  Airway Mallampati: III  TM Distance: >3 FB Neck ROM: Full    Dental  (+) Dental Advisory Given   Pulmonary neg pulmonary ROS, former smoker,    Pulmonary exam normal breath sounds clear to auscultation       Cardiovascular hypertension, Pt. on medications Normal cardiovascular exam Rhythm:Regular Rate:Normal     Neuro/Psych negative neurological ROS  negative psych ROS   GI/Hepatic negative GI ROS, Neg liver ROS,   Endo/Other  negative endocrine ROS  Renal/GU Renal disease     Musculoskeletal negative musculoskeletal ROS (+)   Abdominal (+) + obese,   Peds  Hematology negative hematology ROS (+)   Anesthesia Other Findings   Reproductive/Obstetrics negative OB ROS                           Anesthesia Physical Anesthesia Plan  ASA: III  Anesthesia Plan: MAC   Post-op Pain Management:    Induction: Intravenous  PONV Risk Score and Plan:   Airway Management Planned:   Additional Equipment: None  Intra-op Plan:   Post-operative Plan:   Informed Consent: I have reviewed the patients History and Physical, chart, labs and discussed the procedure including the risks, benefits and alternatives for the proposed anesthesia with the patient or authorized representative who has indicated his/her understanding and acceptance.     Dental advisory given  Plan Discussed with: CRNA  Anesthesia Plan Comments:         Anesthesia Quick Evaluation

## 2020-03-12 NOTE — Op Note (Addendum)
[facility] Patient Name: Dawn Fowler Procedure Date: 03/12/2020 MRN: [id number] Attending MD: Carol Ada , MD Date of Birth: [birth year] CSN: [id number] Age: 54 Admit Type: Outpatient Procedure:                Colonoscopy Indications:              Screening for colorectal malignant neoplasm Providers:                Carol Ada, MD, Angus Seller, William Dalton,                            Technician Referring MD:              Medicines:                Propofol per Anesthesia Complications:            No immediate complications. Estimated Blood Loss:     Estimated blood loss: none. Estimated blood loss                            was minimal. Procedure:                Pre-Anesthesia Assessment:                           - Prior to the procedure, a History and Physical                            was performed, and patient medications and                            allergies were reviewed. The patient's tolerance of                            previous anesthesia was also reviewed. The risks                            and benefits of the procedure and the sedation                            options and risks were discussed with the patient.                            All questions were answered, and informed consent                            was obtained. Prior Anticoagulants: The patient has                            taken no previous anticoagulant or antiplatelet                            agents. ASA Grade Assessment: III - A patient with                            severe systemic disease.  After reviewing the risks                            and benefits, the patient was deemed in                            satisfactory condition to undergo the procedure.                           - Sedation was administered by an anesthesia                            professional. Deep sedation was attained.                           After obtaining informed consent, the  colonoscope                            was passed under direct vision. Throughout the                            procedure, the patient's blood pressure, pulse, and                            oxygen saturations were monitored continuously. The                            CF-HQ190L ([id number]) Olympus colonoscope was                            introduced through the anus and advanced to the the                            cecum, identified by appendiceal orifice and                            ileocecal valve. The colonoscopy was performed                            without difficulty. The patient tolerated the                            procedure well. The quality of the bowel                            preparation was excellent. The ileocecal valve,                            appendiceal orifice, and rectum were photographed. Scope In: 8:33:02 AM Scope Out: 8:56:41 AM Scope Withdrawal Time: 0 hours 2 minutes 37 seconds  Total Procedure Duration: 0 hours 23 minutes 39 seconds  Findings:      Two sessile and semi-pedunculated polyps were found in the transverse       colon and ascending colon. The polyps were 3 to 10 mm in size.  These       polyps were removed with a cold snare. Resection was complete, but the       polyp tissue was only partially retrieved. For hemostasis, two       hemostatic clips were successfully placed (MR unsafe). There was no       bleeding at the end of the procedure.      The larger ascending colon polyp ended up having a smaller stalk. There       was some mild persistent oozing from the polypectomy site. Two hemoclips       were successfully placed and the bleeding was arrested. Impression:               - Two 3 to 8 mm polyps in the transverse colon and                            in the ascending colon, removed with a cold snare.                            Complete resection. Partial retrieval. Clips (MR                            unsafe) were placed. Moderate  Sedation:      Not Applicable - Patient had care per Anesthesia. Recommendation:           - Patient has a contact number available for                            emergencies. The signs and symptoms of potential                            delayed complications were discussed with the                            patient. Return to normal activities tomorrow.                            Written discharge instructions were provided to the                            patient.                           - Resume previous diet.                           - Continue present medications.                           - Await pathology results.                           - Repeat colonoscopy in 3 years for surveillance. Procedure Code(s):        --- Professional ---                           [phone], Colonoscopy, flexible; with  removal of                            tumor(s), polyp(s), or other lesion(s) by snare                            technique Diagnosis Code(s):        --- Professional ---                           Z12.11, Encounter for screening for malignant                            neoplasm of colon                           K63.5, Polyp of colon CPT copyright 2019 American Medical Association. All rights reserved. The codes documented in this report are preliminary and upon coder review may  be revised to meet current compliance requirements. Carol Ada, MD Carol Ada, MD 03/12/2020 9:02:27 AM This report has been signed electronically. Number of Addenda: 0

## 2020-03-12 NOTE — Transfer of Care (Signed)
Immediate Anesthesia Transfer of Care Note  Patient: Dawn Fowler  Procedure(s) Performed: COLONOSCOPY WITH PROPOFOL (N/A ) POLYPECTOMY HEMOSTASIS CLIP PLACEMENT  Patient Location: Endoscopy Unit  Anesthesia Type:MAC  Level of Consciousness: drowsy  Airway & Oxygen Therapy: Patient Spontanous Breathing and Patient connected to face mask oxygen  Post-op Assessment: Report given to RN and Post -op Vital signs reviewed and stable  Post vital signs: Reviewed and stable  Last Vitals:  Vitals Value Taken Time  BP    Temp    Pulse    Resp    SpO2      Last Pain:  Vitals:   03/12/20 0808  TempSrc: Oral  PainSc: 0-No pain         Complications: No apparent anesthesia complications

## 2020-03-12 NOTE — Discharge Instructions (Signed)

## 2020-03-12 NOTE — H&P (Signed)
  Dawn Fowler HPI: This is a 54 year old female here for a routine screening colonoscopy.  She has restrictive lung disease and she requires home oxygen.  The patient denies any overt GI issues.  Past Medical History:  Diagnosis Date  . Acute respiratory failure due to COVID-19 ([place]) 10/30/2019  . CKD (chronic kidney disease), stage III   . Complication of anesthesia    hard to wake up after general anesthesia  . COVID-19 virus detected 11/20/2019   Tested positive for Covid in December/2020 Required hospitalization in December/2020 through January/2021 Received Redemsivir  . Family history of adverse reaction to anesthesia    hard to wake up after general anesthesia  . HTN (hypertension)   . Pulmonary HTN ([place])   . Sleep apnea    on CPAP    Past Surgical History:  Procedure Laterality Date  . CARPAL TUNNEL RELEASE Bilateral   . CESAREAN SECTION    . LEG SURGERY Right 1988   metal rod  . lumbar surgery    . RIGHT HEART CATH N/A 12/18/2019   Procedure: RIGHT HEART CATH;  Surgeon: Larey Dresser, MD;  Location: [facility];  Service: Cardiovascular;  Laterality: N/A;    Family History  Problem Relation Age of Onset  . COPD Father   . Cancer Mother        gallbladder and bladder   . Hypertension Brother   . Breast cancer Other   . Diabetes Son   . Breast cancer Cousin   . Breast cancer Cousin   . Pulmonary Hypertension Neg Hx     Social History:  reports that she has quit smoking. She has never used smokeless tobacco. She reports current alcohol use. She reports previous drug use.  Allergies:  Allergies  Allergen Reactions  . Adhesive [Tape] Hives    Tolerates paper tape  . Succinylcholine Other (See Comments)    Have trouble waking up  . Tizanidine Hives    Medications: Scheduled: Continuous:  No results found for this or any previous visit (from the past 24 hour(s)).   No results found.  ROS:  As stated above in the HPI otherwise  negative.  There were no vitals taken for this visit.    PE: Gen: NAD, Alert and Oriented HEENT:  [place]/AT, EOMI Neck: Supple, no LAD Lungs: CTA Bilaterally CV: RRR without M/G/R ABD: Soft, NTND, +BS Ext: No C/C/E  Assessment/Plan: 1) Screening colonoscopy.  Filip Luten D 03/12/2020, 7:48 AM

## 2020-03-15 LAB — SURGICAL PATHOLOGY

## 2020-03-15 NOTE — Anesthesia Postprocedure Evaluation (Signed)
Anesthesia Post Note  Patient: Dawn Fowler  Procedure(s) Performed: COLONOSCOPY WITH PROPOFOL (N/A ) POLYPECTOMY HEMOSTASIS CLIP PLACEMENT     Patient location during evaluation: PACU Anesthesia Type: MAC Level of consciousness: awake and alert Pain management: pain level controlled Vital Signs Assessment: post-procedure vital signs reviewed and stable Respiratory status: spontaneous breathing Cardiovascular status: stable Anesthetic complications: no    Last Vitals:  Vitals:   03/12/20 0930 03/12/20 0935  BP: (!) 169/104 (!) 162/103  Pulse: 70 70  Resp: 17 18  Temp:    SpO2: 94% 99%    Last Pain:  Vitals:   03/12/20 0920  TempSrc:   PainSc: 0-No pain                 Nolon Nations

## 2020-03-16 ENCOUNTER — Encounter: Payer: Self-pay | Admitting: *Deleted

## 2020-03-17 NOTE — Progress Notes (Signed)
Advanced Heart Failure Clinic Note   PCP: Hayden Rasmussen, MD PCP-Cardiologist: No primary care provider on file.  AHF: Dr. Aundra Dubin   HPI: 54 y/o AAF, presenting to clinic for post hospital f/u after recent admission for a/c diastolic HF w/ volume overload, in the setting of pulmonary hypertension.   She has a long history of severe OSA, and suspect OHS/OSA.  She is on CPAP at home and 8L home oxygen (says she has been on oxygen for "years.") She recently moved to [place].  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  She additionally has significant HTN.  She had been on Opsumit 10 + tadalafil 20 mg daily.  She ran out a couple of weeks prior to being admitted to [facility]. She also has a history of prior DVT after long train ride, has had 1 or 2 negative V/Q scans.  She was hospitalized for COVID-19 at [facility] in 1/21 and had been more SOB since that time.   She was seen by [facility] Pulmonology post discharge and was noted to be volume overloaded, Lasix was changed to torsemide but she was unable to get to pharmacy to pick up. Subsequently, she developed progressive wt gain, nearly 20 lb and increased dyspnea, prompting her to report back to the ED, where she was found to be in acute on chronic diastolic HF and readmitted for IV diuresis and AHF consultation.   Echo with LVEF 55-60%, moderate RV dilation/moderate RV dysfunction.  [place] 2/18 showed elevated left and right heart filling pressures with severe pulmonary hypertension. PFTs completed showing severe restriction and severely decreased DLCO. High Resolution CT- concerning  for small vessel disease, no ILD, and concerning for PAH.  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 She was  continued on IV diuretics and had good response, diuresing 32 lb. She was transitioned to torsemide 40 mg/20 mg. Opsumit and tadalafil retsarted. She was advised to continue w/ supp O2 and CPAP.   Today she returns for HF follow up. Last visit torsemide was increased. Overall feeling fine. Remains SOB with exertion. She has been off torsemide since April? Denies PND/Orthopnea. Appetite ok. No fever or chills. Weight at home 248 pounds. Taking all medications but has been out of torsemide?  Currently not working. On disability. Lives alone.   Past Medical History:  Diagnosis Date  . Acute respiratory failure due to COVID-19 ([place]) 10/30/2019  . CKD (chronic kidney disease), stage III   . Complication of anesthesia    hard to wake up after general anesthesia  . COVID-19 virus detected 11/20/2019   Tested positive for Covid in December/2020 Required hospitalization in December/2020 through January/2021 Received Redemsivir  . Family history of adverse reaction to anesthesia    hard to wake up after general anesthesia  . HTN (hypertension)   . Pulmonary HTN ([place])   . Sleep apnea    on CPAP    Current Outpatient Medications  Medication Sig Dispense Refill  . acetaminophen (TYLENOL) 500 MG tablet Take 1,000 mg by mouth every 6 (six) hours as needed for moderate pain or headache.    . albuterol (VENTOLIN HFA) 108 (90 Base) MCG/ACT inhaler Inhale 2 puffs into the lungs every 6 (six) hours as needed for  wheezing or shortness of breath. 6.7 g 0  . allopurinol (ZYLOPRIM) 100 MG tablet Take 100 mg by mouth daily.    . Biotin w/ Vitamins C & E (HAIR SKIN & NAILS GUMMIES PO) Take 2 tablets by mouth daily.    . Cholecalciferol (VITAMIN D-3) 125 MCG (5000 UT) TABS Take 5,000 mg by mouth daily.    . colchicine 0.6 MG tablet Take 0.6 mg by mouth daily as needed (gout flare).    . gabapentin (NEURONTIN) 100 MG capsule Take 300 mg by mouth at bedtime.     . hydrALAZINE (APRESOLINE) 100 MG tablet Take 1 tablet  (100 mg total) by mouth every 8 (eight) hours. 180 tablet 0  . ipratropium-albuterol (DUONEB) 0.5-2.5 (3) MG/3ML SOLN Take 3 mLs by nebulization every 6 (six) hours as needed. (Patient taking differently: Take 3 mLs by nebulization every 6 (six) hours as needed (shortness of breath). ) 360 mL 5  . macitentan (OPSUMIT) 10 MG tablet Take 10 mg by mouth daily.    . Multiple Vitamins-Minerals (ADULT GUMMY) CHEW Chew 2 tablets by mouth daily.    Marland Kitchen NIFEdipine (PROCARDIA XL/NIFEDICAL-XL) 90 MG 24 hr tablet Take 1 tablet (90 mg total) by mouth daily. 60 tablet 0  . pantoprazole (PROTONIX) 40 MG tablet Take 40 mg by mouth daily.    . potassium chloride (KLOR-CON) 20 MEQ packet Take 20 mEq by mouth daily.    Marland Kitchen PREMARIN 0.625 MG tablet Take 0.625 mg by mouth daily.    Marland Kitchen spironolactone (ALDACTONE) 25 MG tablet Take 0.5 tablets (12.5 mg total) by mouth daily. 45 tablet 1  . tadalafil, PAH, (ALYQ) 20 MG tablet Take 1 tablet (20 mg total) by mouth daily. (Patient taking differently: Take 40 mg by mouth daily. ) 30 tablet 11  . traMADol (ULTRAM) 50 MG tablet Take 50 mg by mouth every 6 (six) hours as needed for pain.    . TURMERIC-GINGER PO Take 2 tablets by mouth daily.     No current facility-administered medications for this encounter.    Allergies  Allergen Reactions  . Adhesive [Tape] Hives    Tolerates paper tape  . Succinylcholine Other (See Comments)    Have trouble waking up  . Tizanidine Hives      Social History   Socioeconomic History  . Marital status: Single    Spouse name: Not on file  . Number of children: Not on file  . Years of education: Not on file  . Highest education level: Not on file  Occupational History  . Not on file  Tobacco Use  . Smoking status: Former Research scientist (life sciences)  . Smokeless tobacco: Never Used  . Tobacco comment: only smoked for fun in highschool not even one a day  Substance and Sexual Activity  . Alcohol use: Yes    Comment: occ  . Drug use: Not Currently  .  Sexual activity: Yes    Partners: Male    Comment: 1st intercourse- 29, partners- 48, current partner- 10 yrs   Other Topics Concern  . Not on file  Social History Narrative  . Not on file   Social Determinants of Health   Financial Resource Strain:   . Difficulty of Paying Living Expenses:   Food Insecurity:   . Worried About Charity fundraiser in the Last Year:   . Arboriculturist in the Last Year:   Transportation Needs:   . Film/video editor (Medical):   Marland Kitchen Lack of Transportation (Non-Medical):  Physical Activity:   . Days of Exercise per Week:   . Minutes of Exercise per Session:   Stress:   . Feeling of Stress :   Social Connections:   . Frequency of Communication with Friends and Family:   . Frequency of Social Gatherings with Friends and Family:   . Attends Religious Services:   . Active Member of Clubs or Organizations:   . Attends Archivist Meetings:   Marland Kitchen Marital Status:   Intimate Partner Violence:   . Fear of Current or Ex-Partner:   . Emotionally Abused:   Marland Kitchen Physically Abused:   . Sexually Abused:       Family History  Problem Relation Age of Onset  . COPD Father   . Cancer Mother        gallbladder and bladder   . Hypertension Brother   . Breast cancer Other   . Diabetes Son   . Breast cancer Cousin   . Breast cancer Cousin   . Pulmonary Hypertension Neg Hx     Vitals:   03/18/20 1459  BP: 132/82  Pulse: 99  SpO2: 91%  Weight: 115.1 kg (253 lb 12.8 oz)   Wt Readings from Last 3 Encounters:  03/18/20 115.1 kg (253 lb 12.8 oz)  03/12/20 112.5 kg (248 lb)  02/13/20 114.8 kg (253 lb)     PHYSICAL EXAM: General:  Walked in the clinic with a rolling walker.  No resp difficulty HEENT: normal Neck: supple. JVP 9-10 . Carotids 2+ bilat; no bruits. No lymphadenopathy or thryomegaly appreciated. Cor: PMI nondisplaced. Regular rate & rhythm. No rubs, gallops or murmurs. Lungs: clear on 8 liters.  Abdomen: soft, nontender,  nondistended. No hepatosplenomegaly. No bruits or masses. Good bowel sounds. Extremities: no cyanosis, clubbing, rash, R and LLE 1+ edema Neuro: alert & orientedx3, cranial nerves grossly intact. moves all 4 extremities w/o difficulty. Affect pleasant    ASSESSMENT & PLAN:  1. Chronic diastolic CHF with significant RV dysfunction: Echo with LVEF 55-60%, moderate RV dilation/moderate RV dysfunction.  [place] 2/18 showed elevated left and right heart filling pressures with severe pulmonary hypertension.  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  -NYHA III. Volume status elevated. Not sure why torsemide was stopped.  - Start torsemide 40 mg daily.  - Check BMET in 7 days.  - Discussed low salt food choices.  2. Pulmonary hypertension: suspect that this is a mixed picture with group 2 and group 3 PH (related to diastolic LV dysfunction and OHS/OSA).  Cannot fully rule out group 1 PH. V/Q scan not suggestive of chronic PEs and CT chest in 2019 was not suggestive of ILD. HIV and rheumatologic serologies negative. 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  - PFTs completed => severe restriction, severely decreased DLCO.  -High Resolution CT- concerning  for small vessel disease, no ILD, and concerning for PAH.   - Suspect that the key to treatment will be maintaining oxygen saturation with home O2 and CPAP. Weight loss also would be helpful.  - Continue Opsumit 10 mg daily. - Continue tadalafil 20 mg daily  3. HTN:  Add torsemide today. Suspect BP will come down.  - Continue 12.5 mg spironolactone daily. 4. CKD stage 3b: baseline SCr ~2 Check BMET next week.  5. OSA: compliant w/ CPAP - Continue CPAP   Follow up in 6 weeks.     Darrick Grinder, NP 03/18/20

## 2020-03-18 ENCOUNTER — Other Ambulatory Visit: Payer: Self-pay

## 2020-03-18 ENCOUNTER — Ambulatory Visit (HOSPITAL_COMMUNITY)
Admission: RE | Admit: 2020-03-18 | Discharge: 2020-03-18 | Disposition: A | Discharge status: Home / Self Care | Payer: Medicare Other | Source: Ambulatory Visit | Attending: Cardiology | Admitting: Cardiology

## 2020-03-18 VITALS — BP 132/82 | HR 99 | Wt 253.8 lb

## 2020-03-18 DIAGNOSIS — G4733 Obstructive sleep apnea (adult) (pediatric): Secondary | ICD-10-CM | POA: Diagnosis not present

## 2020-03-18 DIAGNOSIS — Z79899 Other long term (current) drug therapy: Secondary | ICD-10-CM | POA: Diagnosis not present

## 2020-03-18 DIAGNOSIS — Z87891 Personal history of nicotine dependence: Secondary | ICD-10-CM | POA: Insufficient documentation

## 2020-03-18 DIAGNOSIS — I5032 Chronic diastolic (congestive) heart failure: Secondary | ICD-10-CM | POA: Insufficient documentation

## 2020-03-18 DIAGNOSIS — Z9989 Dependence on other enabling machines and devices: Secondary | ICD-10-CM | POA: Diagnosis not present

## 2020-03-18 DIAGNOSIS — Z8249 Family history of ischemic heart disease and other diseases of the circulatory system: Secondary | ICD-10-CM | POA: Diagnosis not present

## 2020-03-18 DIAGNOSIS — Z86718 Personal history of other venous thrombosis and embolism: Secondary | ICD-10-CM | POA: Insufficient documentation

## 2020-03-18 DIAGNOSIS — N1832 Chronic kidney disease, stage 3b: Secondary | ICD-10-CM | POA: Diagnosis not present

## 2020-03-18 DIAGNOSIS — I130 Hypertensive heart and chronic kidney disease with heart failure and stage 1 through stage 4 chronic kidney disease, or unspecified chronic kidney disease: Secondary | ICD-10-CM | POA: Diagnosis not present

## 2020-03-18 DIAGNOSIS — I272 Pulmonary hypertension, unspecified: Secondary | ICD-10-CM | POA: Diagnosis not present

## 2020-03-18 DIAGNOSIS — I10 Essential (primary) hypertension: Secondary | ICD-10-CM

## 2020-03-18 DIAGNOSIS — I50810 Right heart failure, unspecified: Secondary | ICD-10-CM | POA: Diagnosis not present

## 2020-03-18 DIAGNOSIS — Z8616 Personal history of COVID-19: Secondary | ICD-10-CM | POA: Diagnosis not present

## 2020-03-18 MED ORDER — TORSEMIDE 20 MG PO TABS
40.0000 mg | ORAL_TABLET | Freq: Every day | ORAL | 3 refills | Status: DC
Start: 2020-03-18 — End: 2020-06-07

## 2020-03-18 NOTE — Patient Instructions (Signed)
START Torsemide 40 mg (2 tabs) daily  Labs needed in 7-10 days  Please be sure to follow up with Dr. Marla Roe [phone]  Your physician recommends that you schedule a follow-up appointment in: 6 weeks  in the Advanced Practitioners (PA/NP) Clinic   Your physician recommends that you schedule a follow-up appointment in: 62 weeks with Dr Aundra Dubin  Do the following things EVERYDAY: 1) Weigh yourself in the morning before breakfast. Write it down and keep it in a log. 2) Take your medicines as prescribed 3) Eat low salt foods-Limit salt (sodium) to 2000 mg per day.  4) Stay as active as you can everyday 5) Limit all fluids for the day to less than 2 liters

## 2020-03-25 ENCOUNTER — Encounter (HOSPITAL_COMMUNITY): Payer: Self-pay

## 2020-03-25 ENCOUNTER — Other Ambulatory Visit: Payer: Self-pay

## 2020-03-25 ENCOUNTER — Ambulatory Visit (HOSPITAL_COMMUNITY)
Admission: RE | Admit: 2020-03-25 | Discharge: 2020-03-25 | Disposition: A | Discharge status: Home / Self Care | Payer: Medicare Other | Source: Ambulatory Visit | Attending: Cardiology | Admitting: Cardiology

## 2020-03-25 DIAGNOSIS — I50810 Right heart failure, unspecified: Secondary | ICD-10-CM | POA: Insufficient documentation

## 2020-03-25 LAB — BASIC METABOLIC PANEL
Anion gap: 11 (ref 5–15)
BUN: 34 mg/dL — ABNORMAL HIGH (ref 6–20)
CO2: 21 mmol/L — ABNORMAL LOW (ref 22–32)
Calcium: 9.1 mg/dL (ref 8.9–10.3)
Chloride: 106 mmol/L (ref 98–111)
Creatinine, Ser: 2.13 mg/dL — ABNORMAL HIGH (ref 0.44–1.00)
GFR calc Af Amer: 30 mL/min — ABNORMAL LOW (ref >60)
GFR calc non Af Amer: 26 mL/min — ABNORMAL LOW (ref >60)
Glucose, Bld: 144 mg/dL — ABNORMAL HIGH (ref 70–99)
Potassium: 4.3 mmol/L (ref 3.5–5.1)
Sodium: 138 mmol/L (ref 135–145)

## 2020-03-30 ENCOUNTER — Other Ambulatory Visit (HOSPITAL_COMMUNITY): Payer: Self-pay

## 2020-03-30 ENCOUNTER — Telehealth (HOSPITAL_COMMUNITY): Payer: Self-pay

## 2020-03-30 DIAGNOSIS — I5032 Chronic diastolic (congestive) heart failure: Secondary | ICD-10-CM

## 2020-03-30 NOTE — Telephone Encounter (Signed)
-----   Message from Larey Dresser, MD sent at 03/25/2020  4:30 PM EDT ----- Decrease torsemide to 20 mg daily with higher creatinine.  BMET 10 days.

## 2020-03-30 NOTE — Telephone Encounter (Signed)
Dawn Malm, RN  03/30/2020 10:15 AM EDT    Spoke with patient, she reports she has received msg on mychart to reduce torsemide. Pt will rtc in 1 week to check bmet   Dawn Malm, RN  03/25/2020 4:59 PM EDT    Pt left message on mychart. I responded with recommendations. Advised patient to call office to schedule lab appt.    Dawn Malm, RN  03/25/2020 4:50 PM EDT    LM for patient to return call to office   Larey Dresser, MD  03/25/2020 4:30 PM EDT    Decrease torsemide to 20 mg daily with higher creatinine. BMET 10 days.

## 2020-03-30 NOTE — Telephone Encounter (Signed)
error 

## 2020-04-01 ENCOUNTER — Telehealth (INDEPENDENT_AMBULATORY_CARE_PROVIDER_SITE_OTHER): Payer: Medicare Other | Admitting: Obstetrics & Gynecology

## 2020-04-01 DIAGNOSIS — N840 Polyp of corpus uteri: Secondary | ICD-10-CM

## 2020-04-01 DIAGNOSIS — R9389 Abnormal findings on diagnostic imaging of other specified body structures: Secondary | ICD-10-CM | POA: Diagnosis not present

## 2020-04-01 DIAGNOSIS — N950 Postmenopausal bleeding: Secondary | ICD-10-CM

## 2020-04-01 NOTE — Progress Notes (Signed)
Dawn Fowler [birth year] [id number]        54 y.o.  G1P1L1  Televisit with video after verbal consent.  Patient well identified.  Preop counseling for HSC/Myosure Excision/D+C during 20 minutes with patient in her home and myself in my [place] office.  RP: Preop HSC/Myosure Excision of Polyp/D+C  HPI:  No PMB currently, no pelvic pain.  Seen on 03/04/2020: Postmenopausal x2 years with hot flushes and night sweats when entered menopause. Experienced mild pelvic cramping with dark spotting vaginally 2 weeks ago. No recurrence of bleeding since then. No current pelvic pain. Sexually active with her boyfriend. No pain with intercourse. No abnormal vaginal discharge. No fever. Per patient, history of fibroids. Urine and bowel movements normal.  OB History  Gravida Para Term Preterm AB Living  1 1       1   SAB TAB Ectopic Multiple Live Births               # Outcome Date GA Lbr Len/2nd Weight Sex Delivery Anes PTL Lv  1 Para             Past medical history,surgical history, problem list, medications, allergies, family history and social history were all reviewed and documented in the EPIC chart.   Directed ROS with pertinent positives and negatives documented in the history of present illness/assessment and plan.  Exam:  There were no vitals filed for this visit. General appearance:  Normal  Pelvic US 03/04/2020: T/V images.  Anteverted uterus normal in size and shape with 3 small intramural fibroids all below 1 cm in diameter.  The overall uterine size is measured at 8.89 x 5.07 x 4.54 cm.  The endometrial lining is thickened measured at 9.83 mm with cystic changes and a feeder vessel noted to the area of thickening.  3D imaging suggest a 1.8 x 1.4 cm endometrial mass compatible with a polyp. Both ovaries are small with atrophic appearance.  No adnexal mass.  No free fluid in the posterior cul-de-sac.   Assessment/Plan:  54 y.o. G1P1   1. Postmenopausal bleeding Postmenopausal,  on no hormone replacement therapy.  Postmenopausal bleeding early April.  Pelvic ultrasound findings thoroughly reviewed with patient today.  Ovaries normal with no free fluid.  The uterus is overall normal in size with 3 small intramural fibroids all below 1 cm in diameter.  The endometrial lining is thickened measured at 9.83 mm with cystic changes and a feeder vessel noted.  On 3D imaging a 1.8 x 1.4 cm endometrial mass compatible with a polyp is present.  Decision to proceed with a hysteroscopy, MyoSure excision and D&C.    Preop preparation, surgical procedure with risks and benefits and postop expectations and precautions reviewed thoroughly with patient.  Patient voiced understanding and agreement with plan.  2. Endometrial polyp Schedule hysteroscopy with MyoSure excision of polyp and D&C.                          Patient was counseled as to the risk of surgery to include the following:  1. Infection (prohylactic antibiotics will be administered)  2. DVT/Pulmonary Embolism (prophylactic pneumo compression stockings will be used)  3.Trauma to internal organs requiring additional surgical procedure to repair any injury to internal organs requiring perhaps additional hospitalization days.  4.Hemmorhage requiring transfusion and blood products which carry risks such as     anaphylactic reaction, hepatitis and AIDS  Patient had received literature information on the procedure scheduled  and all her questions were answered and fully accepts all risk.   Princess Bruins MD, 3:02 PM 04/01/2020

## 2020-04-05 ENCOUNTER — Other Ambulatory Visit: Payer: Self-pay

## 2020-04-05 ENCOUNTER — Other Ambulatory Visit (HOSPITAL_COMMUNITY)
Admission: RE | Admit: 2020-04-05 | Discharge: 2020-04-05 | Disposition: A | Discharge status: Home / Self Care | Payer: Medicare Other | Source: Ambulatory Visit

## 2020-04-05 ENCOUNTER — Ambulatory Visit (HOSPITAL_COMMUNITY)
Admission: RE | Admit: 2020-04-05 | Discharge: 2020-04-05 | Disposition: A | Discharge status: Home / Self Care | Payer: Medicare Other | Source: Ambulatory Visit | Attending: Internal Medicine | Admitting: Internal Medicine

## 2020-04-05 DIAGNOSIS — I5032 Chronic diastolic (congestive) heart failure: Secondary | ICD-10-CM | POA: Diagnosis not present

## 2020-04-05 DIAGNOSIS — Z20822 Contact with and (suspected) exposure to covid-19: Secondary | ICD-10-CM | POA: Diagnosis not present

## 2020-04-05 LAB — BASIC METABOLIC PANEL
Anion gap: 13 (ref 5–15)
BUN: 30 mg/dL — ABNORMAL HIGH (ref 6–20)
CO2: 24 mmol/L (ref 22–32)
Calcium: 9.8 mg/dL (ref 8.9–10.3)
Chloride: 104 mmol/L (ref 98–111)
Creatinine, Ser: 2.09 mg/dL — ABNORMAL HIGH (ref 0.44–1.00)
GFR calc Af Amer: 31 mL/min — ABNORMAL LOW (ref >60)
GFR calc non Af Amer: 26 mL/min — ABNORMAL LOW (ref >60)
Glucose, Bld: 107 mg/dL — ABNORMAL HIGH (ref 70–99)
Potassium: 4.5 mmol/L (ref 3.5–5.1)
Sodium: 141 mmol/L (ref 135–145)

## 2020-04-05 LAB — SARS CORONAVIRUS 2 (TAT 6-24 HRS): SARS Coronavirus 2: NEGATIVE

## 2020-04-06 ENCOUNTER — Encounter: Payer: Self-pay | Admitting: Obstetrics & Gynecology

## 2020-04-07 ENCOUNTER — Encounter (HOSPITAL_COMMUNITY): Payer: Self-pay | Admitting: Obstetrics & Gynecology

## 2020-04-07 NOTE — Progress Notes (Signed)
Anesthesia Chart Review: SAME DAY WORK-UP   Case: [id number] Date/Time: 04/08/20 0815   Procedure: DILATATION & CURETTAGE/HYSTEROSCOPY WITH MYOSURE (N/A ) - request 8:30am OR start-requested through [facility] for [facility] requests one hour   Anesthesia type: Choice   Pre-op diagnosis: post menopausal bleeding, endometrial polyp   Location: [facility] OR ROOM 12 / [place] OR   Surgeons: Princess Bruins, MD      DISCUSSION: Patient is a 54 year old female scheduled for the above procedure.   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   - Hospitalized 12/17/19-12/23/19 with acute on chronic diastolic CHF with significant RV dysfunction. (Known history of pulmonary hypertension, diastolic CHF, OSA, CKD diagnosed ~ 2016; moved to [facility] ~ 2020). 12/18/19 RHC showed elevated left and right heart filling pressures with severe pulmonary hypertension, suspected mixed pulmonary venous/pulmonary arterial hypertension in setting of significant LV diastolic dysfunction.  Diuresed 30 pounds.Out-patient follow-up with primary care, pulmonology and HF Clinic. Per HF Clinic notes,  "Pulmonary hypertension: suspect that this is a mixed picture with group 2 and group 3 PH (related to diastolic LV dysfunction and OHS/OSA). Cannot fully rule out group 1 PH. V/Q scan not suggestive of chronic PEs and CT chest in 2019 was not suggestive of ILD. HIV and rheumatologic serologies negative. 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  - PFTscompleted=> severe restriction, severely decreased DLCO. -High Resolution CT- concerning for small vessel disease, no ILD, and concerning for PAH.  - Suspect that the key to treatment will be maintaining oxygen saturation with  home O2 and CPAP. Weight loss also would be helpful.  -ContinueOpsumit 10 mg daily. - Continue tadalafil 20 mg daily".   - Last evaluated at [facility] on 03/18/20 by Darrick Grinder, NP. In regards to pulmonary hypertension, " Overall doing okay, but volume status elevated in the setting of being off torsemide (unsure why it had been held). Torsemide resumed, but dose decreased on 03/30/20 due to increase in Creatinine 1.66-2.13. Compliant with CPAP. Six week follow-up planned.  04/05/2020 preprocedure COVID-19 test negative.  Patient is a same-day work-up, so will be further evaluated on the day of surgery.  History reviewed with anesthesiologist Josephine Igo, MD.   VS:   Wt Readings from Last 3 Encounters:  03/18/20 115.1 kg  03/12/20 112.5 kg  02/13/20 114.8 kg   BP Readings from Last 3 Encounters:  03/18/20 132/82  03/12/20 (!) 162/103  02/13/20 134/86   Pulse Readings from Last 3 Encounters:  03/18/20 99  03/12/20 70  02/05/20 94    PROVIDERS: Hayden Rasmussen, MD is PCP  [name], MD is HF cardiologist Margaretha Seeds, MD is pulmonologist. Last evaluation 01/06/20. At that time was on 4L O2 at rest and 6L with exertion.  Continued dyspnea despite improved volume status for better control of her pulmonary hypertension.  Split night study ordered as well as Duo nebs as needed.   LABS: Lab results as of 04/05/20 include: Lab Results  Component Value Date   WBC 5.8 12/18/2019   HGB 15.6 (H) 12/18/2019   HCT 46.0 12/18/2019   PLT 249 12/18/2019   GLUCOSE 107 (H) 04/05/2020   ALT 54 (H) 12/18/2019   AST 33 12/18/2019   NA 141 04/05/2020   K 4.5 04/05/2020   CL 104 04/05/2020   CREATININE 2.09 (H) 04/05/2020   BUN  30 (H) 04/05/2020   CO2 24 04/05/2020   TSH 5.125 (H) 12/18/2019     Lab Results  Component Value Date   CREATININE 2.09 (H) 04/05/2020   CREATININE 2.13 (H) 03/25/2020   CREATININE 1.66 (H) 02/12/2020  Creatinine range 1.31-2.33 since  09/2019, but Creatinine has been > 1.66 since 12/2019.     OTHER: Colonoscopy 03/12/20: Impression: - Two 3 to 8 mm polyps in the transverse colon and in the ascending colon, removed with a cold snare. Complete resection. Partial retrieval. Clips (MR unsafe) were placed. - Pathology:  A. COLON, ASCENDING, POLYPECTOMY: Tubular adenoma(s).Negative for high-grade dysplasia or malignancy  B. COLON, TRANSVERSE, POLYPECTOMY: Polypoid colonic mucosa with no specific histopathologic changes   PFTs 12/21/19: FVC 1.25 (42%), post 1.43 (48%) FEV1 1.09 (46%), post 1.27 (54%) DLCO unc 7.00 (32%), cor 6.84 (31%) Interpretation: No evidence of obstructive defect however significant response to bronchodilators suggestive of asthma. Co-comitant restrictive lung defect with reduced gas exchange also present.  IMAGES: CT Chest High resolution 12/21/19: IMPRESSION: 1. No findings to suggest interstitial lung disease. 2. There is a spectrum of findings concerning for small vessel disease in the lungs, as detailed above. 3. Dilatation of the pulmonic trunk (4.2 cm in diameter), concerning for pulmonary arterial hypertension start that compatible with reported clinical history of pulmonary arterial hypertension. 4. Hepatic steatosis. Aortic Atherosclerosis (ICD10-I70.0) and Emphysema (ICD10-J43.9).  VQ Scan 12/19/19: IMPRESSION: No evidence acute or chronic pulmonary embolism.   EKG: 01/01/20: Normal sinus rhythm Possible Anterior infarct , age undetermined Abnormal ECG Since last tracing rate faster Confirmed by Larae Grooms [phone]) on 01/01/2020 6:20:10 PM   CV: Echo 12/18/19: IMPRESSIONS  1. Left ventricular ejection fraction, by estimation, is 55 to 60%. The  left ventricle has normal function. The left ventricle has no regional  wall motion abnormalities. Left ventricular diastolic parameters are  consistent with Grade I diastolic  dysfunction (impaired relaxation).  2. Right  ventricular systolic function is moderately reduced. The right  ventricular size is moderately enlarged. There is severely elevated  pulmonary artery systolic pressure. The estimated right ventricular  systolic pressure is 63.8 mmHg. D-shaped  interventricular septum is suggestive of RV pressure/volume overload.  3. Right atrial size was moderately dilated.  4. The mitral valve is normal in structure and function. Trivial mitral  valve regurgitation. No evidence of mitral stenosis.  5. The aortic valve is tricuspid. Aortic valve regurgitation is not  visualized. No aortic stenosis is present.  6. The inferior vena cava is normal in size with <50% respiratory  variability, suggesting right atrial pressure of 8 mmHg.    Cardiac cath 12/18/19: Hemodynamics (mmHg) RA mean 14 RV 87/21 PA 89/33, mean 54 PCWP mean 34  Oxygen saturations: SVC 63% PA 61% AO 94%  Cardiac Output (Fick) 5.17  Cardiac Index (Fick) 2.22 PVR 3.87 WU  1. Elevated right and left heart filling pressures.  2. Severe pulmonary hypertension, looks like mixed pulmonary venous and pulmonary arterial hypertension.   3. Preserved cardiac output.   Needs ongoing diuresis.   Past Medical History:  Diagnosis Date  . Acute respiratory failure due to COVID-19 ([place]) 10/30/2019  . CKD (chronic kidney disease), stage III   . Complication of anesthesia    hard to wake up after general anesthesia - had trouble waking up after succinylcholine (consider possibility of pseudocholinesterase deficiency)  . COVID-19 virus detected 11/20/2019   Tested positive for Covid in December/2020 Required hospitalization in December/2020 through January/2021 Received Redemsivir  .  Family history of adverse reaction to anesthesia    hard to wake up after general anesthesia  . HTN (hypertension)   . Pulmonary HTN ([place])   . Sleep apnea    on CPAP    Past Surgical History:  Procedure Laterality Date  . CARPAL TUNNEL RELEASE  Bilateral   . CESAREAN SECTION    . COLONOSCOPY WITH PROPOFOL N/A 03/12/2020   Procedure: COLONOSCOPY WITH PROPOFOL;  Surgeon: Carol Ada, MD;  Location: [facility] ENDOSCOPY;  Service: Endoscopy;  Laterality: N/A;  . HEMOSTASIS CLIP PLACEMENT  03/12/2020   Procedure: HEMOSTASIS CLIP PLACEMENT;  Surgeon: Carol Ada, MD;  Location: [facility] ENDOSCOPY;  Service: Endoscopy;;  . LEG SURGERY Right 1988   metal rod  . lumbar surgery    . POLYPECTOMY  03/12/2020   Procedure: POLYPECTOMY;  Surgeon: Carol Ada, MD;  Location: [facility];  Service: Endoscopy;;  . RIGHT HEART CATH N/A 12/18/2019   Procedure: RIGHT HEART CATH;  Surgeon: Larey Dresser, MD;  Location: [place] [facility];  Service: Cardiovascular;  Laterality: N/A;    MEDICATIONS: No current facility-administered medications for this encounter.   Marland Kitchen acetaminophen (TYLENOL) 500 MG tablet  . albuterol (VENTOLIN HFA) 108 (90 Base) MCG/ACT inhaler  . allopurinol (ZYLOPRIM) 100 MG tablet  . Biotin w/ Vitamins C & E (HAIR SKIN & NAILS GUMMIES PO)  . Cholecalciferol (VITAMIN D-3) 125 MCG (5000 UT) TABS  . colchicine 0.6 MG tablet  . gabapentin (NEURONTIN) 100 MG capsule  . hydrALAZINE (APRESOLINE) 100 MG tablet  . ipratropium-albuterol (DUONEB) 0.5-2.5 (3) MG/3ML SOLN  . macitentan (OPSUMIT) 10 MG tablet  . Multiple Vitamins-Minerals (ADULT GUMMY) CHEW  . NIFEdipine (PROCARDIA XL/NIFEDICAL-XL) 90 MG 24 hr tablet  . pantoprazole (PROTONIX) 40 MG tablet  . potassium chloride (KLOR-CON) 20 MEQ packet  . PREMARIN 0.625 MG tablet  . spironolactone (ALDACTONE) 25 MG tablet  . tadalafil, PAH, (ALYQ) 20 MG tablet  . torsemide (DEMADEX) 20 MG tablet  . traMADol (ULTRAM) 50 MG tablet  . TURMERIC-GINGER PO     Myra Gianotti, PA-C Surgical Short Stay/Anesthesiology [facility] Phone [phone] [facility] Phone [phone] 04/07/2020 5:32 PM

## 2020-04-07 NOTE — Progress Notes (Signed)
Several unsuccessful attempts were made to contact pt. Please complete assessment on DOS. Nurse lvm with pre-op instructions according to pre-op checklist. Nurse made pt aware to stop taking Aspirin (unless otherwise advised by surgeon), vitamins, fish oil, Biotin, Turmeric and herbal medications. Do not take any NSAIDs ie: Ibuprofen, Advil, Naproxen (Aleve), Motrin, BC and Goody Powder. Nurse asked PA,  Anesthesiology, to review pt history, see note.

## 2020-04-07 NOTE — Anesthesia Preprocedure Evaluation (Addendum)
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 (+) Teeth Intact   Pulmonary shortness of breath, with exertion, at rest, lying and Long-Term Oxygen Therapy, sleep apnea, Continuous Positive Airway Pressure Ventilation and Oxygen sleep apnea , pneumonia, resolved, former smoker,  Hx/o Covid-19 infection with acute respiratory failure 10/29/19   Pulmonary exam normal breath sounds clear to auscultation       Cardiovascular hypertension, Pt. on medications +CHF  Normal cardiovascular exam Rhythm:Regular Rate:Normal  Echo 12/18/19: IMPRESSIONS  1. Left ventricular ejection fraction, by estimation, is 55 to 60%. The  left ventricle has normal function. The left ventricle has no regional  wall motion abnormalities. Left ventricular diastolic parameters are  consistent with Grade I diastolic  dysfunction (impaired relaxation).  2. Right ventricular systolic function is moderately reduced. The right  ventricular size is moderately enlarged. There is severely elevated  pulmonary artery systolic pressure. The estimated right ventricular  systolic pressure is 06.2 mmHg. D-shaped  interventricular septum is suggestive of RV pressure/volume overload.  3. Right atrial size was moderately dilated.  4. The mitral valve is normal in structure and function. Trivial mitral  valve regurgitation. No evidence of mitral stenosis.  5. The aortic valve is tricuspid. Aortic valve regurgitation is not  visualized. No aortic stenosis is  present.  6. The inferior vena cava is normal in size with <50% respiratory  variability, suggesting right atrial pressure of 8 mmHg.    Cardiac cath 12/18/19: Hemodynamics (mmHg) RA mean 14 RV 87/21 PA 89/33, mean 54 PCWP mean 34 Oxygen saturations: SVC 63% PA 61% AO 94% Cardiac Output (Fick) 5.17  Cardiac Index (Fick) 2.22 PVR 3.87 WU 1. Elevated right and left heart filling pressures.  2. Severe pulmonary hypertension, looks like mixed pulmonary venous and pulmonary arterial hypertension.  3. Preserved cardiac output.   Pulmonary HTN - severe  EKG 01/01/20 NSR, possible anterior infarct   Neuro/Psych negative neurological ROS  negative psych ROS   GI/Hepatic negative GI ROS, Neg liver ROS,   Endo/Other  Morbid obesity  Renal/GU Renal InsufficiencyRenal disease  negative genitourinary   Musculoskeletal negative musculoskeletal ROS (+)   Abdominal (+) + obese,   Peds  Hematology negative hematology ROS (+)   Anesthesia Other Findings   Reproductive/Obstetrics PMB Endometrial polyp                          Anesthesia Physical Anesthesia Plan  ASA: III  Anesthesia Plan: General   Post-op Pain Management:    Induction: Intravenous  PONV Risk Score and Plan: 4 or greater and Midazolam, Ondansetron, Dexamethasone and Treatment may vary due to age or medical condition  Airway Management Planned: LMA  Additional Equipment:   Intra-op Plan:   Post-operative Plan: Extubation in OR  Informed Consent: I have reviewed the patients History and Physical, chart, labs and discussed the procedure including the risks, benefits and alternatives for the proposed anesthesia with the patient or authorized representative who has indicated his/her understanding and acceptance.     Dental advisory given  Plan Discussed with: CRNA and Surgeon  Anesthesia Plan Comments: (  See PAT note written 04/07/2020 by Myra Gianotti, PA-C. Pulmonary  HTN, home O2, diastolic CHF, OSA, CKD. 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

## 2020-04-08 ENCOUNTER — Other Ambulatory Visit: Payer: Self-pay | Admitting: Nephrology

## 2020-04-08 ENCOUNTER — Other Ambulatory Visit: Payer: Self-pay

## 2020-04-08 ENCOUNTER — Ambulatory Visit (HOSPITAL_COMMUNITY): Payer: Medicare Other | Admitting: Vascular Surgery

## 2020-04-08 ENCOUNTER — Encounter (HOSPITAL_COMMUNITY): Payer: Self-pay | Admitting: Obstetrics & Gynecology

## 2020-04-08 ENCOUNTER — Ambulatory Visit (HOSPITAL_COMMUNITY)
Admission: RE | Admit: 2020-04-08 | Discharge: 2020-04-08 | Disposition: A | Discharge status: Home / Self Care | Payer: Medicare Other | Attending: Obstetrics & Gynecology | Admitting: Obstetrics & Gynecology

## 2020-04-08 ENCOUNTER — Encounter (HOSPITAL_COMMUNITY)
Admission: RE | Disposition: A | Discharge status: Home / Self Care | Payer: Self-pay | Source: Home / Self Care | Attending: Obstetrics & Gynecology

## 2020-04-08 DIAGNOSIS — N841 Polyp of cervix uteri: Secondary | ICD-10-CM | POA: Diagnosis not present

## 2020-04-08 DIAGNOSIS — I2721 Secondary pulmonary arterial hypertension: Secondary | ICD-10-CM | POA: Insufficient documentation

## 2020-04-08 DIAGNOSIS — Z833 Family history of diabetes mellitus: Secondary | ICD-10-CM | POA: Insufficient documentation

## 2020-04-08 DIAGNOSIS — N183 Chronic kidney disease, stage 3 unspecified: Secondary | ICD-10-CM | POA: Insufficient documentation

## 2020-04-08 DIAGNOSIS — J439 Emphysema, unspecified: Secondary | ICD-10-CM | POA: Insufficient documentation

## 2020-04-08 DIAGNOSIS — K219 Gastro-esophageal reflux disease without esophagitis: Secondary | ICD-10-CM | POA: Diagnosis not present

## 2020-04-08 DIAGNOSIS — Z800 Family history of malignant neoplasm of digestive organs: Secondary | ICD-10-CM | POA: Insufficient documentation

## 2020-04-08 DIAGNOSIS — G4730 Sleep apnea, unspecified: Secondary | ICD-10-CM | POA: Insufficient documentation

## 2020-04-08 DIAGNOSIS — Z91048 Other nonmedicinal substance allergy status: Secondary | ICD-10-CM | POA: Diagnosis not present

## 2020-04-08 DIAGNOSIS — Z8616 Personal history of COVID-19: Secondary | ICD-10-CM | POA: Diagnosis not present

## 2020-04-08 DIAGNOSIS — E785 Hyperlipidemia, unspecified: Secondary | ICD-10-CM | POA: Insufficient documentation

## 2020-04-08 DIAGNOSIS — R9389 Abnormal findings on diagnostic imaging of other specified body structures: Secondary | ICD-10-CM | POA: Diagnosis not present

## 2020-04-08 DIAGNOSIS — Z86010 Personal history of colonic polyps: Secondary | ICD-10-CM | POA: Insufficient documentation

## 2020-04-08 DIAGNOSIS — Z888 Allergy status to other drugs, medicaments and biological substances status: Secondary | ICD-10-CM | POA: Insufficient documentation

## 2020-04-08 DIAGNOSIS — Z79899 Other long term (current) drug therapy: Secondary | ICD-10-CM | POA: Insufficient documentation

## 2020-04-08 DIAGNOSIS — N840 Polyp of corpus uteri: Secondary | ICD-10-CM | POA: Insufficient documentation

## 2020-04-08 DIAGNOSIS — I5030 Unspecified diastolic (congestive) heart failure: Secondary | ICD-10-CM | POA: Insufficient documentation

## 2020-04-08 DIAGNOSIS — N950 Postmenopausal bleeding: Secondary | ICD-10-CM | POA: Insufficient documentation

## 2020-04-08 DIAGNOSIS — K760 Fatty (change of) liver, not elsewhere classified: Secondary | ICD-10-CM | POA: Diagnosis not present

## 2020-04-08 DIAGNOSIS — I130 Hypertensive heart and chronic kidney disease with heart failure and stage 1 through stage 4 chronic kidney disease, or unspecified chronic kidney disease: Secondary | ICD-10-CM | POA: Diagnosis not present

## 2020-04-08 DIAGNOSIS — Z803 Family history of malignant neoplasm of breast: Secondary | ICD-10-CM | POA: Insufficient documentation

## 2020-04-08 DIAGNOSIS — N1832 Chronic kidney disease, stage 3b: Secondary | ICD-10-CM

## 2020-04-08 DIAGNOSIS — Z9981 Dependence on supplemental oxygen: Secondary | ICD-10-CM | POA: Insufficient documentation

## 2020-04-08 DIAGNOSIS — Z87891 Personal history of nicotine dependence: Secondary | ICD-10-CM | POA: Insufficient documentation

## 2020-04-08 DIAGNOSIS — Z825 Family history of asthma and other chronic lower respiratory diseases: Secondary | ICD-10-CM | POA: Insufficient documentation

## 2020-04-08 DIAGNOSIS — M1990 Unspecified osteoarthritis, unspecified site: Secondary | ICD-10-CM | POA: Insufficient documentation

## 2020-04-08 DIAGNOSIS — R0602 Shortness of breath: Secondary | ICD-10-CM | POA: Insufficient documentation

## 2020-04-08 DIAGNOSIS — Z8249 Family history of ischemic heart disease and other diseases of the circulatory system: Secondary | ICD-10-CM | POA: Insufficient documentation

## 2020-04-08 DIAGNOSIS — I700 Atherosclerosis of aorta: Secondary | ICD-10-CM | POA: Insufficient documentation

## 2020-04-08 DIAGNOSIS — Z8052 Family history of malignant neoplasm of bladder: Secondary | ICD-10-CM | POA: Insufficient documentation

## 2020-04-08 DIAGNOSIS — D251 Intramural leiomyoma of uterus: Secondary | ICD-10-CM | POA: Diagnosis not present

## 2020-04-08 HISTORY — DX: Unspecified osteoarthritis, unspecified site: M19.90

## 2020-04-08 HISTORY — DX: Gastro-esophageal reflux disease without esophagitis: K21.9

## 2020-04-08 HISTORY — DX: Prediabetes: R73.03

## 2020-04-08 HISTORY — PX: DILATATION & CURETTAGE/HYSTEROSCOPY WITH MYOSURE: SHX6511

## 2020-04-08 HISTORY — DX: Hyperlipidemia, unspecified: E78.5

## 2020-04-08 HISTORY — DX: Dyspnea, unspecified: R06.00

## 2020-04-08 LAB — CBC
HCT: 45.7 % (ref 36.0–46.0)
Hemoglobin: 14.5 g/dL (ref 12.0–15.0)
MCH: 30.9 pg (ref 26.0–34.0)
MCHC: 31.7 g/dL (ref 30.0–36.0)
MCV: 97.2 fL (ref 80.0–100.0)
Platelets: 251 10*3/uL (ref 150–400)
RBC: 4.7 MIL/uL (ref 3.87–5.11)
RDW: 14.4 % (ref 11.5–15.5)
WBC: 6.1 10*3/uL (ref 4.0–10.5)
nRBC: 0 % (ref 0.0–0.2)

## 2020-04-08 LAB — POCT PREGNANCY, URINE: Preg Test, Ur: NEGATIVE

## 2020-04-08 SURGERY — DILATATION & CURETTAGE/HYSTEROSCOPY WITH MYOSURE
Anesthesia: General | Site: Vagina

## 2020-04-08 MED ORDER — ONDANSETRON HCL 4 MG/2ML IJ SOLN
INTRAMUSCULAR | Status: AC
  Filled 2020-04-08: qty 2

## 2020-04-08 MED ORDER — LIDOCAINE 2% (20 MG/ML) 5 ML SYRINGE
INTRAMUSCULAR | Status: DC | PRN
  Administered 2020-04-08: 80 mg via INTRAVENOUS

## 2020-04-08 MED ORDER — PROPOFOL 10 MG/ML IV BOLUS
INTRAVENOUS | Status: DC | PRN
  Administered 2020-04-08: 30 mg via INTRAVENOUS
  Administered 2020-04-08: 100 mg via INTRAVENOUS
  Administered 2020-04-08: 30 mg via INTRAVENOUS

## 2020-04-08 MED ORDER — MIDAZOLAM HCL 5 MG/5ML IJ SOLN
INTRAMUSCULAR | Status: DC | PRN
  Administered 2020-04-08: 1 mg via INTRAVENOUS

## 2020-04-08 MED ORDER — SODIUM CHLORIDE 0.9 % IV SOLN: INTRAVENOUS | Status: DC

## 2020-04-08 MED ORDER — SILVER NITRATE-POT NITRATE 75-25 % EX MISC
CUTANEOUS | Status: AC
  Filled 2020-04-08: qty 10

## 2020-04-08 MED ORDER — CEFAZOLIN SODIUM-DEXTROSE 2-4 GM/100ML-% IV SOLN
2.0000 g | INTRAVENOUS | Status: AC
  Administered 2020-04-08: 2 g via INTRAVENOUS
  Filled 2020-04-08: qty 100

## 2020-04-08 MED ORDER — SODIUM CHLORIDE 0.9 % IR SOLN
Status: DC | PRN
  Administered 2020-04-08: 3000 mL

## 2020-04-08 MED ORDER — FENTANYL CITRATE (PF) 250 MCG/5ML IJ SOLN
INTRAMUSCULAR | Status: AC
  Filled 2020-04-08: qty 5

## 2020-04-08 MED ORDER — MIDAZOLAM HCL 2 MG/2ML IJ SOLN
INTRAMUSCULAR | Status: AC
  Filled 2020-04-08: qty 2

## 2020-04-08 MED ORDER — CHLOROPROCAINE HCL 1 % IJ SOLN
INTRAMUSCULAR | Status: AC
  Filled 2020-04-08: qty 30

## 2020-04-08 MED ORDER — LACTATED RINGERS IV SOLN: INTRAVENOUS | Status: DC

## 2020-04-08 MED ORDER — ONDANSETRON HCL 4 MG/2ML IJ SOLN
INTRAMUSCULAR | Status: DC | PRN
  Administered 2020-04-08: 4 mg via INTRAVENOUS

## 2020-04-08 MED ORDER — PROPOFOL 10 MG/ML IV BOLUS
INTRAVENOUS | Status: AC
  Filled 2020-04-08: qty 20

## 2020-04-08 MED ORDER — FENTANYL CITRATE (PF) 100 MCG/2ML IJ SOLN
INTRAMUSCULAR | Status: DC | PRN
  Administered 2020-04-08: 50 ug via INTRAVENOUS

## 2020-04-08 MED ORDER — LIDOCAINE 2% (20 MG/ML) 5 ML SYRINGE
INTRAMUSCULAR | Status: AC
  Filled 2020-04-08: qty 5

## 2020-04-08 SURGICAL SUPPLY — 16 items
CANISTER SUCT 3000ML PPV (MISCELLANEOUS) ×2 IMPLANT
CATH ROBINSON RED A/P 16FR (CATHETERS) ×2 IMPLANT
DEVICE MYOSURE LITE (MISCELLANEOUS) IMPLANT
DEVICE MYOSURE REACH (MISCELLANEOUS) ×2 IMPLANT
GLOVE BIO SURGEON STRL SZ 6.5 (GLOVE) ×2 IMPLANT
GLOVE BIOGEL PI IND STRL 7.0 (GLOVE) ×2 IMPLANT
GLOVE BIOGEL PI INDICATOR 7.0 (GLOVE) ×2
GOWN STRL REUS W/ TWL LRG LVL3 (GOWN DISPOSABLE) ×2 IMPLANT
GOWN STRL REUS W/TWL LRG LVL3 (GOWN DISPOSABLE) ×2
KIT PROCEDURE FLUENT (KITS) ×2 IMPLANT
KIT TURNOVER KIT B (KITS) ×2 IMPLANT
PACK VAGINAL MINOR WOMEN LF (CUSTOM PROCEDURE TRAY) ×2 IMPLANT
PAD OB MATERNITY 4.3X12.25 (PERSONAL CARE ITEMS) ×2 IMPLANT
SEAL ROD LENS SCOPE MYOSURE (ABLATOR) ×2 IMPLANT
TOWEL GREEN STERILE FF (TOWEL DISPOSABLE) ×4 IMPLANT
UNDERPAD 30X36 HEAVY ABSORB (UNDERPADS AND DIAPERS) ×2 IMPLANT

## 2020-04-08 NOTE — H&P (Signed)
Dawn Fowler is an 54 y.o. female.  G1P1L1    RP: HSC/Myosure Excision of Polyp/D+C  HPI:  Mild PMB currently, no pelvic pain.  Seen on 04/01/2020: Postmenopausal x 2 years with hot flushes and night sweats when entered menopause. Experienced mild pelvic cramping with dark spotting vaginally in 01/2020. Sexually active with her boyfriend. No pain with intercourse. No abnormal vaginal discharge. No fever. Per patient, history of fibroids. Urine and bowel movements normal.  Pertinent Gynecological History: Menses: Postmenopausal with PMB Blood transfusions: none Last mammogram: normal  Last pap: normal    Menstrual History: No LMP recorded (lmp unknown). Patient is postmenopausal.    Past Medical History:  Diagnosis Date  . Acute respiratory failure due to COVID-19 ([place]) 10/30/2019  . Arthritis   . CKD (chronic kidney disease), stage III   . Complication of anesthesia    hard to wake up after general anesthesia - had trouble waking up after succinylcholine (consider possibility of pseudocholinesterase deficiency)  . COVID-19 virus detected 11/20/2019   Tested positive for Covid in December/2020 Required hospitalization in December/2020 through January/2021 Received Redemsivir  . Dyspnea   . Family history of adverse reaction to anesthesia    hard to wake up after general anesthesia  . GERD (gastroesophageal reflux disease)   . HLD (hyperlipidemia)   . HTN (hypertension)   . Pre-diabetes    no meds  . Pulmonary HTN ([place])   . Sleep apnea    on CPAP    Past Surgical History:  Procedure Laterality Date  . CARPAL TUNNEL RELEASE Bilateral   . CESAREAN SECTION    . COLONOSCOPY WITH PROPOFOL N/A 03/12/2020   Procedure: COLONOSCOPY WITH PROPOFOL;  Surgeon: Carol Ada, MD;  Location: [facility];  Service: Endoscopy;  Laterality: N/A;  . HEMOSTASIS CLIP PLACEMENT  03/12/2020   Procedure: HEMOSTASIS CLIP PLACEMENT;  Surgeon: Carol Ada, MD;  Location: [facility];   Service: Endoscopy;;  . LEG SURGERY Right 1988   metal rod  . lumbar surgery    . POLYPECTOMY  03/12/2020   Procedure: POLYPECTOMY;  Surgeon: Carol Ada, MD;  Location: [facility];  Service: Endoscopy;;  . RIGHT HEART CATH N/A 12/18/2019   Procedure: RIGHT HEART CATH;  Surgeon: Larey Dresser, MD;  Location: [place] [facility];  Service: Cardiovascular;  Laterality: N/A;    Family History  Problem Relation Age of Onset  . COPD Father   . Cancer Mother        gallbladder and bladder   . Hypertension Brother   . Breast cancer Other   . Diabetes Son   . Breast cancer Cousin   . Breast cancer Cousin   . Pulmonary Hypertension Neg Hx     Social History:  reports that she has quit smoking. She has never used smokeless tobacco. She reports current alcohol use. She reports previous drug use.  Allergies:  Allergies  Allergen Reactions  . Adhesive [Tape] Hives    Tolerates paper tape  . Succinylcholine Other (See Comments)    Have trouble waking up  . Tizanidine Hives    Medications Prior to Admission  Medication Sig Dispense Refill Last Dose  . acetaminophen (TYLENOL) 500 MG tablet Take 1,000 mg by mouth every 6 (six) hours as needed for moderate pain or headache.   04/07/2020 at Unknown time  . albuterol (VENTOLIN HFA) 108 (90 Base) MCG/ACT inhaler Inhale 2 puffs into the lungs every 6 (six) hours as needed for wheezing or shortness of breath. 6.7 g  0 Past Month at Unknown time  . allopurinol (ZYLOPRIM) 100 MG tablet Take 100 mg by mouth daily.   04/07/2020 at Unknown time  . Biotin w/ Vitamins C & E (HAIR SKIN & NAILS GUMMIES PO) Take 2 tablets by mouth daily.   04/07/2020 at Unknown time  . Cholecalciferol (VITAMIN D-3) 125 MCG (5000 UT) TABS Take 5,000 mg by mouth daily.   04/07/2020 at Unknown time  . colchicine 0.6 MG tablet Take 0.6 mg by mouth daily as needed (gout flare).   Past Month at Unknown time  . gabapentin (NEURONTIN) 100 MG capsule Take 300 mg by mouth at bedtime.     04/07/2020 at Unknown time  . hydrALAZINE (APRESOLINE) 100 MG tablet Take 1 tablet (100 mg total) by mouth every 8 (eight) hours. 180 tablet 0 04/07/2020 at Unknown time  . ipratropium-albuterol (DUONEB) 0.5-2.5 (3) MG/3ML SOLN Take 3 mLs by nebulization every 6 (six) hours as needed. (Patient taking differently: Take 3 mLs by nebulization every 6 (six) hours as needed (shortness of breath). ) 360 mL 5 04/07/2020 at Unknown time  . macitentan (OPSUMIT) 10 MG tablet Take 10 mg by mouth daily.   04/07/2020 at Unknown time  . Multiple Vitamins-Minerals (ADULT GUMMY) CHEW Chew 2 tablets by mouth daily.   04/07/2020 at Unknown time  . NIFEdipine (ADALAT CC) 60 MG 24 hr tablet Take 60 mg by mouth 2 (two) times daily.   04/07/2020 at Unknown time  . OXYGEN Inhale 4-8 L into the lungs continuous.   04/08/2020 at Unknown time  . pantoprazole (PROTONIX) 40 MG tablet Take 40 mg by mouth daily.   04/07/2020 at Unknown time  . potassium chloride SA (KLOR-CON) 20 MEQ tablet Take 40 mEq by mouth 2 (two) times daily.   04/07/2020 at Unknown time  . PREMARIN 0.625 MG tablet Take 0.625 mg by mouth daily.   04/07/2020 at Unknown time  . spironolactone (ALDACTONE) 25 MG tablet Take 0.5 tablets (12.5 mg total) by mouth daily. 45 tablet 1 04/07/2020 at Unknown time  . tadalafil, PAH, (ALYQ) 20 MG tablet Take 1 tablet (20 mg total) by mouth daily. (Patient taking differently: Take 40 mg by mouth daily. ) 30 tablet 11 04/07/2020 at Unknown time  . torsemide (DEMADEX) 20 MG tablet Take 2 tablets (40 mg total) by mouth daily. 60 tablet 3 04/07/2020 at Unknown time  . traMADol (ULTRAM) 50 MG tablet Take 50 mg by mouth every 6 (six) hours as needed for pain.   Past Week at Unknown time  . TURMERIC-GINGER PO Take 2 tablets by mouth daily.   04/07/2020 at Unknown time    REVIEW OF SYSTEMS: A ROS was performed and pertinent positives and negatives are included in the history.  GENERAL: No fevers or chills. HEENT: No change in vision, no earache, sore  throat or sinus congestion. NECK: No pain or stiffness. CARDIOVASCULAR: No chest pain or pressure. No palpitations. PULMONARY: No shortness of breath, cough or wheeze. GASTROINTESTINAL: No abdominal pain, nausea, vomiting or diarrhea, melena or bright red blood per rectum. GENITOURINARY: No urinary frequency, urgency, hesitancy or dysuria. MUSCULOSKELETAL: No joint or muscle pain, no back pain, no recent trauma. DERMATOLOGIC: No rash, no itching, no lesions. ENDOCRINE: No polyuria, polydipsia, no heat or cold intolerance. No recent change in weight. HEMATOLOGICAL: No anemia or easy bruising or bleeding. NEUROLOGIC: No headache, seizures, numbness, tingling or weakness. PSYCHIATRIC: No depression, no loss of interest in normal activity or change in sleep pattern.  Blood pressure 118/77, pulse 66, temperature 97.8 F (36.6 C), temperature source Oral, resp. rate 17, height 5\' 1"  (1.549 m), weight 114.8 kg, SpO2 96 %.  Physical Exam:  See office notes   Results for orders placed or performed during the hospital encounter of 04/08/20 (from the past 24 hour(s))  CBC     Status: None   Collection Time: 04/08/20  7:26 AM  Result Value Ref Range   WBC 6.1 4.0 - 10.5 K/uL   RBC 4.70 3.87 - 5.11 MIL/uL   Hemoglobin 14.5 12.0 - 15.0 g/dL   HCT 45.7 36 - 46 %   MCV 97.2 80.0 - 100.0 fL   MCH 30.9 26.0 - 34.0 pg   MCHC 31.7 30.0 - 36.0 g/dL   RDW 14.4 11.5 - 15.5 %   Platelets 251 150 - 400 K/uL   nRBC 0.0 0.0 - 0.2 %  Pregnancy, urine POC     Status: None   Collection Time: 04/08/20  7:49 AM  Result Value Ref Range   Preg Test, Ur NEGATIVE NEGATIVE   Covid Negative 04/05/2020  Pelvic US 03/04/2020: T/V images. Anteverted uterus normal in size and shape with 3 small intramural fibroids all below 1 cm in diameter. The overall uterine size is measured at 8.89 x 5.07 x 4.54 cm. The endometrial lining is thickened measured at 9.83 mm with cystic changes and a feeder vessel noted to the area of  thickening. 3D imaging suggest a 1.8 x 1.4 cm endometrial mass compatible with a polyp. Both ovaries are small with atrophic appearance. No adnexal mass. No free fluid in the posterior cul-de-sac.   Assessment/Plan:  54 y.o. G1P1   1. Postmenopausal bleeding Postmenopausal, on no hormone replacement therapy. Postmenopausal bleeding early April. Pelvic ultrasound findings thoroughly reviewed with patient today. Ovaries normal with no free fluid. The uterus is overall normal in size with 3 small intramural fibroids all below 1 cm in diameter. The endometrial lining is thickened measured at 9.83 mm with cystic changes and a feeder vessel noted. On 3D imaging a 1.8 x 1.4 cm endometrial mass compatible with a polyp is present. Decision to proceed with a hysteroscopy, MyoSure excision and D&C.   Preop preparation, surgical procedure with risks and benefits and postop expectations and precautions reviewed thoroughly with patient.  Patient voiced understanding and agreement with plan.  Very significant medical issues including ChronicRenal disease, pulmonary disease, pulmonary HTN, cHTN.  Had COVID with Cx.  Will proceed at [facility] for Medical/ICU support as needed.  2. Endometrial polyp Schedule hysteroscopy with MyoSure excision of polyp and D&C.                         Patient was counseled as to the risk of surgery to include the following:  1. Infection (prohylactic antibiotics will be administered)  2. DVT/Pulmonary Embolism (prophylactic pneumo compression stockings will be used)  3.Trauma to internal organs requiring additional surgical procedure to repair any injury to internal organs requiring perhaps additional hospitalization days.  4.Hemmorhage requiring transfusion and blood products which carry risks such as     anaphylactic reaction, hepatitis and AIDS  Patient had received literature information on the procedure scheduled and all her questions were answered and fully  accepts all risk.  Dawn Fowler 04/08/2020, 8:15 AM

## 2020-04-08 NOTE — Op Note (Signed)
Operative Note  04/08/2020  9:18 AM  PATIENT:  Dawn Fowler  54 y.o. female  PRE-OPERATIVE DIAGNOSIS:  Post menopausal bleeding, Endometrial polyp  POST-OPERATIVE DIAGNOSIS:  Post menopausal bleeding, Endometrial polyp, Thickened Endometrium, Endocervical Polyp  PROCEDURE:  Procedure(s): DILATATION & CURETTAGE/HYSTEROSCOPY WITH MYOSURE EXCISION  SURGEON:  Surgeon(s): Princess Bruins, MD  ANESTHESIA:   general  FINDINGS: Thickened Endometrium, Probable small Endometrial Polyps, Endocervical Polyp  DESCRIPTION OF OPERATION: Under general anesthesia with laryngeal mask, the patient is in lithotomy position.  She is prepped with Betadine on the suprapubic, vulvar and vaginal areas.  The bladder is catheterized.  And the patient is draped as usual.  Timeout was done.  The case is done at [facility] because of the patient's many medical conditions including chronic kidney disease, pulmonary hypertension, chronic hypertension, hyperlipidemia, sleep apnea.      Vaginal exam reveals an anteverted uterus, with a normal in volume, mobile.  No adnexal mass.  The speculum is inserted in the vagina and the anterior lip of the cervix is grasped with a tenaculum.  The hysterometry is at 9 cm.  Dilation of the cervix with Pratt dilators up to a #19 without difficulty.  The hysteroscope is inserted in the intra uterine cavity.  Inspection reveals normal ostia, a mild heart-shaped uterus.  The endometrium is thickened anteriorly and posteriorly especially at the lower uterine segment with possible small endometrial polyps.  The endocervical canal presents some thickening as well and an endocervical polyp.  The MyoSure reach is used to excise all intra uterine and endocervical lesions as well as the thickened endometrium.  Pictures were taken before excision and after excision.  Hemostasis was adequate.  The hysteroscope with MyoSure were removed.  A systematic intra uterine curettage was done with  a sharp curette on all surfaces.  Both specimens were sent together.  The curette was removed.  The tenaculum was removed from the cervix and the speculum was removed.  The patient was brought to recovery room in good and stable status.  ESTIMATED BLOOD LOSS: 5 mL FLUID DEFICIT: 80 cc  Intake/Output Summary (Last 24 hours) at 04/08/2020 2130 Last data filed at 04/08/2020 8657 Gross per 24 hour  Intake --  Output 105 ml  Net -105 ml     BLOOD ADMINISTERED:none   LOCAL MEDICATIONS USED:  NONE  SPECIMEN:  Source of Specimen:  Endometrial/Endocervical excision material and Endometrial curettings  DISPOSITION OF SPECIMEN:  PATHOLOGY  COUNTS:  YES  PLAN OF CARE: Transfer to PACU  Marie-Lyne LavoieMD9:18 AM

## 2020-04-08 NOTE — Discharge Instructions (Signed)
Hysteroscopy, Care After This sheet gives you information about how to care for yourself after your procedure. Your health care provider may also give you more specific instructions. If you have problems or questions, contact your health care provider. What can I expect after the procedure? After the procedure, it is common to have:  Cramping.  Bleeding. This can vary from light spotting to menstrual-like bleeding. Follow these instructions at home: Activity  Rest for 1-2 days after the procedure.  Do not douche, use tampons, or have sex for 2 weeks after the procedure, or until your health care provider approves.  Do not drive for 24 hours after the procedure, or for as long as told by your health care provider.  Do not drive, use heavy machinery, or drink alcohol while taking prescription pain medicines. Medicines   Take over-the-counter and prescription medicines only as told by your health care provider.  Do not take aspirin during recovery. It can increase the risk of bleeding. General instructions  Do not take baths, swim, or use a hot tub until your health care provider approves. Take showers instead of baths for 2 weeks, or for as long as told by your health care provider.  To prevent or treat constipation while you are taking prescription pain medicine, your health care provider may recommend that you: ? Drink enough fluid to keep your urine clear or pale yellow. ? Take over-the-counter or prescription medicines. ? Eat foods that are high in fiber, such as fresh fruits and vegetables, whole grains, and beans. ? Limit foods that are high in fat and processed sugars, such as fried and sweet foods.  Keep all follow-up visits as told by your health care provider. This is important. Contact a health care provider if:  You feel dizzy or lightheaded.  You feel nauseous.  You have abnormal vaginal discharge.  You have a rash.  You have pain that does not get better with  medicine.  You have chills. Get help right away if:  You have bleeding that is heavier than a normal menstrual period.  You have a fever.  You have pain or cramps that get worse.  You develop new abdominal pain.  You faint.  You have pain in your shoulders.  You have shortness of breath. Summary  After the procedure, you may have cramping and some vaginal bleeding.  Do not douche, use tampons, or have sex for 2 weeks after the procedure, or until your health care provider approves.  Do not take baths, swim, or use a hot tub until your health care provider approves. Take showers instead of baths for 2 weeks, or for as long as told by your health care provider.  Report any unusual symptoms to your health care provider.  Keep all follow-up visits as told by your health care provider. This is important. This information is not intended to replace advice given to you by your health care provider. Make sure you discuss any questions you have with your health care provider. Document Revised: 09/28/2017 Document Reviewed: 11/14/2016 Elsevier Patient Education  2020 [employer]  

## 2020-04-08 NOTE — Anesthesia Postprocedure Evaluation (Signed)
Anesthesia Post Note  Patient: Dawn Fowler  Procedure(s) Performed: DILATATION & CURETTAGE/HYSTEROSCOPY WITH MYOSURE (N/A Vagina )     Patient location during evaluation: PACU Anesthesia Type: General Level of consciousness: awake and alert and oriented Pain management: pain level controlled Vital Signs Assessment: post-procedure vital signs reviewed and stable Respiratory status: spontaneous breathing, nonlabored ventilation, respiratory function stable and patient connected to nasal cannula oxygen Cardiovascular status: blood pressure returned to baseline and stable Postop Assessment: no apparent nausea or vomiting Anesthetic complications: no   No complications documented.  Last Vitals:  Vitals:   04/08/20 0959 04/08/20 1000  BP: (!) 147/92   Pulse: 74   Resp: 20   Temp: 36.7 C   SpO2: 100% 100%    Last Pain:  Vitals:   04/08/20 1000  TempSrc:   PainSc: 2                  [name]

## 2020-04-08 NOTE — Transfer of Care (Signed)
Immediate Anesthesia Transfer of Care Note  Patient: Dawn Fowler  Procedure(s) Performed: DILATATION & CURETTAGE/HYSTEROSCOPY WITH MYOSURE (N/A Vagina )  Patient Location: PACU  Anesthesia Type:General  Level of Consciousness: drowsy and patient cooperative  Airway & Oxygen Therapy: Patient Spontanous Breathing and Patient connected to face mask oxygen  Post-op Assessment: Report given to RN and Post -op Vital signs reviewed and stable  Post vital signs: Reviewed and stable  Last Vitals:  Vitals Value Taken Time  BP 147/92 04/08/20 0959  Temp 36.7 C 04/08/20 0959  Pulse 69 04/08/20 1007  Resp 20 04/08/20 1003  SpO2 85 % 04/08/20 1007  Vitals shown include unvalidated device data.  Last Pain:  Vitals:   04/08/20 1000  TempSrc:   PainSc: 2       Patients Stated Pain Goal: 3 (64/84/72 0721)  Complications: No complications documented.

## 2020-04-08 NOTE — Anesthesia Procedure Notes (Addendum)
Procedure Name: LMA Insertion Date/Time: 04/08/2020 8:45 AM Performed by: Lowella Dell, CRNA Pre-anesthesia Checklist: Patient identified, Emergency Drugs available, Suction available and Patient being monitored Patient Re-evaluated:Patient Re-evaluated prior to induction Oxygen Delivery Method: Circle System Utilized Preoxygenation: Pre-oxygenation with 100% oxygen Induction Type: IV induction Ventilation: Mask ventilation without difficulty LMA: LMA inserted LMA Size: 4.0 Number of attempts: 1 Airway Equipment and Method: Bite block Placement Confirmation: positive ETCO2 Tube secured with: Tape Dental Injury: Teeth and Oropharynx as per pre-operative assessment  Comments: IV induction with Dr Gifford Shave. Easy, atraumatic LMA insertion.

## 2020-04-09 ENCOUNTER — Encounter: Payer: Self-pay | Admitting: *Deleted

## 2020-04-09 ENCOUNTER — Encounter (HOSPITAL_COMMUNITY): Payer: Self-pay | Admitting: Obstetrics & Gynecology

## 2020-04-09 LAB — SURGICAL PATHOLOGY

## 2020-04-13 ENCOUNTER — Other Ambulatory Visit: Payer: Self-pay

## 2020-04-13 ENCOUNTER — Telehealth: Payer: Self-pay | Admitting: Pulmonary Disease

## 2020-04-13 NOTE — Telephone Encounter (Signed)
Patient contacted, appointment follow up made for August with Dr. Loanne Drilling. Patient plans to discuss possible travel for the end of the year.

## 2020-04-20 ENCOUNTER — Telehealth (HOSPITAL_COMMUNITY): Payer: Self-pay | Admitting: Vascular Surgery

## 2020-04-20 ENCOUNTER — Telehealth: Payer: Self-pay | Admitting: Pulmonary Disease

## 2020-04-20 NOTE — Telephone Encounter (Signed)
Patient called, message left to call us back with the name of the medication she needs filled.

## 2020-04-20 NOTE — Telephone Encounter (Signed)
Left pt VM that 7/1 appt will be canceled due to schedule changes, asked pt to keep appt w/ [facility] in Aug, aske dpt o call back to confirm the schedule changes

## 2020-04-21 NOTE — Telephone Encounter (Signed)
ATC pt, call went straight to VM. LMTCB x2 for pt.

## 2020-04-22 ENCOUNTER — Other Ambulatory Visit (HOSPITAL_COMMUNITY): Payer: Self-pay | Admitting: *Deleted

## 2020-04-22 MED ORDER — TADALAFIL (PAH) 20 MG PO TABS: 20.0000 mg | ORAL_TABLET | Freq: Every day | ORAL | 11 refills | Status: DC

## 2020-04-22 NOTE — Telephone Encounter (Signed)
Pt called back regarding tadalafil (Alyq) that she was needing to be filled through [facility].  Let her know that it looked like Dr. Aundra Dubin last filled that prescription and not Dr. Loanne Drilling.  She was going to call their office and I let her know I would send message back to nurse since we had been trying to contact her from earlier message.  Please advise.

## 2020-04-22 NOTE — Telephone Encounter (Signed)
Pt returning call.  [phone]

## 2020-04-22 NOTE — Telephone Encounter (Signed)
Dr. Aundra Dubin filled the Rx today. Will close encounter.

## 2020-04-23 ENCOUNTER — Telehealth: Payer: Self-pay | Admitting: Pulmonary Disease

## 2020-04-23 NOTE — Telephone Encounter (Signed)
error 

## 2020-04-28 ENCOUNTER — Ambulatory Visit
Admission: RE | Admit: 2020-04-28 | Discharge: 2020-04-28 | Disposition: A | Discharge status: Home / Self Care | Payer: Medicare Other | Source: Ambulatory Visit | Attending: Nephrology | Admitting: Nephrology

## 2020-04-28 DIAGNOSIS — N1832 Chronic kidney disease, stage 3b: Secondary | ICD-10-CM

## 2020-04-29 ENCOUNTER — Encounter (HOSPITAL_COMMUNITY): Payer: Medicare Other

## 2020-04-29 ENCOUNTER — Other Ambulatory Visit (HOSPITAL_COMMUNITY): Payer: Self-pay

## 2020-04-29 DIAGNOSIS — I5032 Chronic diastolic (congestive) heart failure: Secondary | ICD-10-CM

## 2020-04-29 MED ORDER — TADALAFIL (PAH) 20 MG PO TABS: 20.0000 mg | ORAL_TABLET | Freq: Every day | ORAL | 11 refills | Status: DC

## 2020-04-29 MED ORDER — OPSUMIT 10 MG PO TABS: 10.0000 mg | ORAL_TABLET | Freq: Every day | ORAL | 11 refills | Status: DC

## 2020-04-29 NOTE — Telephone Encounter (Signed)
Patient called requesting refills and stated that the pharmacy stated that we need to call them about something(patient wasn't clear on what) she did provide a number to call [phone]. I will check further into this. Patient requested a callback once whatever needed to be done was done. rx was sent into pharmacy per patient request

## 2020-04-30 ENCOUNTER — Ambulatory Visit (INDEPENDENT_AMBULATORY_CARE_PROVIDER_SITE_OTHER): Payer: Medicare Other | Admitting: Obstetrics & Gynecology

## 2020-04-30 ENCOUNTER — Encounter: Payer: Self-pay | Admitting: Obstetrics & Gynecology

## 2020-04-30 ENCOUNTER — Other Ambulatory Visit: Payer: Self-pay

## 2020-04-30 VITALS — BP 118/80

## 2020-04-30 DIAGNOSIS — Z09 Encounter for follow-up examination after completed treatment for conditions other than malignant neoplasm: Secondary | ICD-10-CM

## 2020-04-30 NOTE — Progress Notes (Signed)
    Dawn Fowler Jul 06, 1966 [id number]        54 y.o.  G1P1L1  RP: Postop HSC/Myosure excision/D+C on 04/08/2020  HPI: Doing very well postop with no abdominal pelvic pain.  No vaginal bleeding and no abnormal discharge vaginally.  Urine and bowel movements normal.  No fever.   OB History  Gravida Para Term Preterm AB Living  1 1       1   SAB TAB Ectopic Multiple Live Births               # Outcome Date GA Lbr Len/2nd Weight Sex Delivery Anes PTL Lv  1 Para             Past medical history,surgical history, problem list, medications, allergies, family history and social history were all reviewed and documented in the EPIC chart.   Directed ROS with pertinent positives and negatives documented in the history of present illness/assessment and plan.  Exam:  Vitals:   04/30/20 1006  BP: 118/80   General appearance:  Normal  Abdomen: Normal  Gynecologic exam: Vulva normal.  Bimanual exam:  Uterus AV, NT, normal volume, mobile.    Patho 04/08/2020: FINAL MICROSCOPIC DIAGNOSIS:   A. ENDOMETRIUM, POLYP AND CURETTAGE:  - Disordered proliferative endometrium.  - Benign endometrial polyp.  - Benign myometrium and benign endocervical mucosa.  - No atypia or malignancy.    Assessment/Plan:  53 y.o. G1P1   1. Status post gynecological surgery, follow-up exam Excellent postop evolution and healing.  No complication.  Pathology benign, with a benign endometrial polyp.  Will observe.  Follow-up when due for annual gynecologic exam.  Dawn Bruins MD, 10:11 AM 04/30/2020

## 2020-05-02 ENCOUNTER — Encounter: Payer: Self-pay | Admitting: Obstetrics & Gynecology

## 2020-05-02 NOTE — Patient Instructions (Signed)
1. Status post gynecological surgery, follow-up exam Excellent postop evolution and healing.  No complication.  Pathology benign, with a benign endometrial polyp.  Will observe.  Follow-up when due for annual gynecologic exam.  Dawn Fowler, it was a pleasure seeing you today!

## 2020-05-04 ENCOUNTER — Other Ambulatory Visit (HOSPITAL_COMMUNITY): Payer: Self-pay | Admitting: *Deleted

## 2020-05-04 MED ORDER — TADALAFIL (PAH) 20 MG PO TABS: 20.0000 mg | ORAL_TABLET | Freq: Every day | ORAL | 11 refills | Status: DC

## 2020-05-06 ENCOUNTER — Telehealth: Payer: Self-pay | Admitting: Pulmonary Disease

## 2020-05-06 NOTE — Telephone Encounter (Signed)
Please look for fax from [employer] on 05/07/2020. They require the provider to register in REMS and then fill out a REMS for this patient. If Dr. Loanne Drilling is not available then who ever fills out the REM will need to also order the medication. Medication was ordered by Dr. Manus Rudd and taken over by Dr. Loanne Drilling. Med is filled via [facility]. Medication referenced was Opsumit but do not see that Loanne Drilling prescribed this medication in her office notes. Please follow up on 05/07/2020

## 2020-05-07 NOTE — Telephone Encounter (Signed)
Form received, however, we did not prescribe the opsumit, it was prescribed by Dr Sharrie Rothman with the pharmacist and notified them of this  They will contact cards  Nothing further needed

## 2020-05-10 ENCOUNTER — Telehealth: Payer: Self-pay | Admitting: Pulmonary Disease

## 2020-05-10 NOTE — Telephone Encounter (Signed)
There is nothing for [place] to manage telephonically with this.  Patient needs to have an office visit.  I cannot further assess oxygen levels of 83% on 8 L of O2 telephonically.  I can respect the fact the patient would like to have this "addressed" prior to traveling to [place] given her family circumstances.  But unfortunately this will be best addressed with an office visit.  Okay to schedule with primary pulmonologist looks like this may be Dr. Loanne Drilling or an APP to further review.  Wyn Quaker, FNP

## 2020-05-10 NOTE — Telephone Encounter (Signed)
Spoke with patient, she states she has been having sob and sats dropping to 83% 8L oxygen with activity.  She denies any cough or congestion.  She wears a c-pap at hs and states she has to blow her nose during the night and sometimes it is bloody and sometimes dark yellow.  She would like to get this taken care of prior to the end of the month as she is going to [place] to take her mom's ashes.  Aaron Edelman, please advise.  Thank you.

## 2020-05-10 NOTE — Telephone Encounter (Signed)
Called and spoke with Patient.  Brian,NP's recommendations given. Understanding stated.  Patient scheduled 05/11/20, with Eustaquio Maize, NP.

## 2020-05-11 ENCOUNTER — Telehealth (HOSPITAL_COMMUNITY): Payer: Self-pay | Admitting: Pharmacist

## 2020-05-11 ENCOUNTER — Other Ambulatory Visit: Payer: Self-pay

## 2020-05-11 ENCOUNTER — Encounter: Payer: Self-pay | Admitting: Primary Care

## 2020-05-11 ENCOUNTER — Ambulatory Visit: Payer: Medicare Other | Admitting: Primary Care

## 2020-05-11 ENCOUNTER — Ambulatory Visit (INDEPENDENT_AMBULATORY_CARE_PROVIDER_SITE_OTHER): Payer: Medicare Other

## 2020-05-11 VITALS — BP 118/82 | HR 88 | Temp 97.5°F | Ht 61.0 in | Wt 248.0 lb

## 2020-05-11 DIAGNOSIS — I272 Pulmonary hypertension, unspecified: Secondary | ICD-10-CM | POA: Diagnosis not present

## 2020-05-11 DIAGNOSIS — J9611 Chronic respiratory failure with hypoxia: Secondary | ICD-10-CM

## 2020-05-11 DIAGNOSIS — J984 Other disorders of lung: Secondary | ICD-10-CM

## 2020-05-11 DIAGNOSIS — G4733 Obstructive sleep apnea (adult) (pediatric): Secondary | ICD-10-CM

## 2020-05-11 DIAGNOSIS — J9621 Acute and chronic respiratory failure with hypoxia: Secondary | ICD-10-CM

## 2020-05-11 DIAGNOSIS — Z9989 Dependence on other enabling machines and devices: Secondary | ICD-10-CM

## 2020-05-11 DIAGNOSIS — R0602 Shortness of breath: Secondary | ICD-10-CM

## 2020-05-11 MED ORDER — BREO ELLIPTA 200-25 MCG/INH IN AEPB
1.0000 | INHALATION_SPRAY | Freq: Every day | RESPIRATORY_TRACT | 0 refills | Status: DC
Start: 2020-05-11 — End: 2020-06-01

## 2020-05-11 NOTE — Patient Instructions (Addendum)
  Pulmonary HTN: - Continue Torsemide two 20mg  tablets once daily (please make sure you have been taking this, if not please resume) - Continue Spirnolactone 12.5mg  daily  - Continue Sildenafil 20mg  daily - Continue opsumit 10mg  daily  Sleep apnea: - Continue CPAP at current pressure setting (which is 9cm h20)  Chronic respiratory failure: - Continue oxygen 8L on exertion; 4L at rest (make sure to wear your oxygen at all times, compliance is utmost importance) - CXR showed clear lungs. Cardiomegaly.  - Adding ICS/LABA inhaler/ given sample Breo Ellipta, take one puff in the morning every day (rinse mouth after use)  Orders: - Labs BMET, BNP (already ordered) - Please place order for Inogen oxygen concentrator/ 4L oxygen at rest, 8L on exertion   Follow-up: - Over due for follow-up with cardiology/ please contact their office for visit as soon as possible - FU with [place] by telephone in 2 weeks

## 2020-05-11 NOTE — Assessment & Plan Note (Signed)
-  Patient is 100% compliant with CPAP and reports benefit from use -Current CPAP pressure 9 cm H2O; AHI 0.7 -No changes today -Continue to wear CPAP every night for minimum 4 to 6 hours or more

## 2020-05-11 NOTE — Progress Notes (Signed)
'@Patient'  ID: Dawn Fowler, female    DOB: [birth year], 54 y.o.   MRN: [id number]  Chief Complaint  Patient presents with  . Follow-up    Referring provider: Hayden Rasmussen, MD  HPI: 54 year old female, former smoker.  Past medical history significant for severe obstructive sleep apnea (not on CPAP), COVID-19 infection requiring hospitalization, chronic respiratory failure with hypoxia, pulmonary hypertension, right ventricular failure, chronic kidney disease stage III, obesity. Patient of Dr. Loanne Drilling, last seen in March 2021.  Reviewed note from 11/15/2018 by Dr. Kathrynn Humble, MD in pulmonary for pulmonary hypertension.  She was on Opsumit 10 mg daily, sildenafil 20 mg q8h and Procardia 60 mg daily.  She had a right heart cath done in March 2018 (RA 19 PA 76/40/48 PCWP 20 CO 4.02L/min CI 1.9 No vasodilator trial noted)and August 02 2018 however her numbers are cut off on fax. RVSP 57 and PVR 3.09 Wood units. She has a documented 6-minute walk test demonstrating 276 m on 8 L of O2 with SPO2 of 87%.  During these visits she has discussed bariatric surgery and weight loss.  CT Chest without contrast 07/26/18 -(report only) enlarged MPA and centroal pulmonary arteries consistent with hx of pulmonary hypertension.  Stable predominantly basilar mosaic attenuation reflects either small vessel or small airways disease.  Persistent predominant subcarinal, paraesophageal and bilateral infrahilar lymph node enlargement unchanged from 4 months ago.  Stable small perifissural nodule likely representing intranodular lymph node.  Other concerning parenchymal lesions not seen. -Report does not mention any interstitial lung disease.  Previous [facility] pulmonary encounter: 01/06/20- DR. ELLISON Since our last visit, she has been hospitalized and aggressively diuresed with removal of 30lbs for acute on chronic diastolic heart failure with RV dysfunction secondary to severe pulmonary hypertension Group 2 and 3. Heart failure  team restarted Opsumit and tadalafil after diuresis. In-patient PFTs obtained with no evidence of obstruction but reduced FEV1 and FVC and DLCO. She is compliant with her current CPAP. She continues to have shortness of breath. Currently on 4L O2 at rest and 6L on exertion. Trying to limit her salt intake. She is monitoring her weight for fluid accumulation.  05/11/2020 -interim history Patient presents today for an acute office visit with reports of low oxygen readings at home. She has a history of severe pulmonary hypertension group 2-3 and severe OSA. She was ordered for split night sleep study at her last visit with Dr. Loanne Drilling in March. Advised she continue CPAP at current setting until study. PFTs showed restrictive lung disease with positive BD reponse, consider ICS/LABA if dyspnea persists. No evidence of ILD on HRCT.   She states that her O2 level desaturated to 80% on 8L while exerting herself.  She feels well enough to come to office for a visit today, we order stat CXR to be completed prior to [place]. She is compliant with CPAP. She wears CPAP every night. Her DME company previous was [employer]. She has not had split night sleep study d/t covid. She is unsure if she is currently taking her Torsdemide. Reports her weight is up 10 lbs in the last month. She does not have any increased leg swelling. Her O2 will drop into 70-80s on oxygen, she wears 4L at rest and 8L on exertion. She states that she is basically on 8L at home. She does not wear oxygen when she is in the car because she is able to use A.C. She came today without oxygen because she ran out of oxygen tanks,  she has a Paramedic.   She has no complaints of acute respiratory symptoms. Denies cough, chest tightness or wheezing. She has had episode where she needed to use breathing treatment. She has used that twice in the last two months. She is very interested in breast reduction surgery, she states that she breaths a lot easier when she  lifts pressure off of her chest.    She is maintained on Tadalafil, Opsumit, torsemide 16m daily and spironolactone 12.554mdaily prescribed by Dr. McAundra Dubinffice. Pulmonary hypertension medications managed by Cardiology. She was last seen by heart care clinic on May 20th, volume status appeared elev ated, unclear why torsemide was stopped- she was restarted on this 4073maily. They recommended follow-up in 6 weeks, she is over d/t for follow-up with heart failure clinic.    Airview download 02/11/2020-05/10/2020 Usage 90/90; 100% greater than 4 hours Average usage 11 hours 11 minutes CPAP pressure 9 cm H2O AHI 0.8   Imaging: 12/25/2014-VQ scan-mild obstructive airways disease, very low probability of PE 12/18/2014-CTA anterior-no evidence of PE, cardiomegaly, dependent airspace trapping which may be related to morbid obesity or small airways disease ^Unable to view images but can read radiology report  10/29/2019-chest x-ray-marked cardiomegaly with vascular congestion, dilated pulmonary arteries which can be seen in patients with elevated pulmonary pressures, no large focal infiltrate  PFT: 02/11/2015-office spirometry-FVC 2.14 L, FEV1 1.78, ratio 83  12/22/19  FVC 1.43 (48%) FEV1 1.27 (54%) Ratio 87  TLC 64% DLCO 32% Interpretation: No evidence of obstructive defect however significant response to bronchodilators suggestive of asthma. Co-comitant restrictive lung defect with reduced gas exchange also present.  Echo 12/21/2014 LV ejection fraction 60 to 65%, moderate pulmonary hypertension, moderate diastolic dysfunction    12/14/98/37C Procedural Findings: Hemodynamics (mmHg) RA mean 14 RV 87/21 PA 89/33, mean 54 PCWP mean 34 Oxygen saturations: SVC 63% PA 61% AO 94% Cardiac Output (Fick) 5.17  Cardiac Index (Fick) 2.22 PVR 3.87 WU  Allergies  Allergen Reactions  . Adhesive [Tape] Hives    Tolerates paper tape  . Succinylcholine Other (See Comments)    Have trouble  waking up  . Tizanidine Hives    Immunization History  Administered Date(s) Administered  . Influenza Whole 08/20/2019  . Influenza,inj,Quad PF,6+ Mos 06/25/2018  . PFIZER SARS-COV-2 Vaccination 02/02/2020, 02/24/2020  . Pneumococcal Polysaccharide-23 07/17/2014    Past Medical History:  Diagnosis Date  . Acute respiratory failure due to COVID-19 ([facility]2/31/2020  . Arthritis   . CKD (chronic kidney disease), stage III   . Complication of anesthesia    hard to wake up after general anesthesia - had trouble waking up after succinylcholine (consider possibility of pseudocholinesterase deficiency)  . COVID-19 virus detected 11/20/2019   Tested positive for Covid in December/2020 Required hospitalization in December/2020 through January/2021 Received Redemsivir  . Dyspnea   . Family history of adverse reaction to anesthesia    hard to wake up after general anesthesia  . GERD (gastroesophageal reflux disease)   . HLD (hyperlipidemia)   . HTN (hypertension)   . Pre-diabetes    no meds  . Pulmonary HTN ([place] . Sleep apnea    on CPAP    Tobacco History: Social History   Tobacco Use  Smoking Status Former Smoker  Smokeless Tobacco Never Used  Tobacco Comment   only smoked for fun in highschool not even one a day   Counseling given: Not Answered Comment: only smoked for fun in highschool not even one a  day   Outpatient Medications Prior to Visit  Medication Sig Dispense Refill  . acetaminophen (TYLENOL) 500 MG tablet Take 1,000 mg by mouth every 6 (six) hours as needed for moderate pain or headache.    . albuterol (VENTOLIN HFA) 108 (90 Base) MCG/ACT inhaler Inhale 2 puffs into the lungs every 6 (six) hours as needed for wheezing or shortness of breath. 6.7 g 0  . allopurinol (ZYLOPRIM) 100 MG tablet Take 100 mg by mouth daily.    . Biotin w/ Vitamins C & E (HAIR SKIN & NAILS GUMMIES PO) Take 2 tablets by mouth daily.    . Cholecalciferol (VITAMIN D-3) 125 MCG (5000 UT)  TABS Take 5,000 mg by mouth daily.    . colchicine 0.6 MG tablet Take 0.6 mg by mouth daily as needed (gout flare).    . gabapentin (NEURONTIN) 100 MG capsule Take 300 mg by mouth at bedtime.     Marland Kitchen ipratropium-albuterol (DUONEB) 0.5-2.5 (3) MG/3ML SOLN Take 3 mLs by nebulization every 6 (six) hours as needed. (Patient taking differently: Take 3 mLs by nebulization every 6 (six) hours as needed (shortness of breath). ) 360 mL 5  . macitentan (OPSUMIT) 10 MG tablet Take 1 tablet (10 mg total) by mouth daily. 30 tablet 11  . Multiple Vitamins-Minerals (ADULT GUMMY) CHEW Chew 2 tablets by mouth daily.    Marland Kitchen NIFEdipine (ADALAT CC) 60 MG 24 hr tablet Take 60 mg by mouth 2 (two) times daily.    . OXYGEN Inhale 4-8 L into the lungs continuous.    . pantoprazole (PROTONIX) 40 MG tablet Take 40 mg by mouth daily.    . potassium chloride SA (KLOR-CON) 20 MEQ tablet Take 40 mEq by mouth 2 (two) times daily.    Marland Kitchen spironolactone (ALDACTONE) 25 MG tablet Take 0.5 tablets (12.5 mg total) by mouth daily. 45 tablet 1  . tadalafil, PAH, (ALYQ) 20 MG tablet Take 1 tablet (20 mg total) by mouth daily. 30 tablet 11  . torsemide (DEMADEX) 20 MG tablet Take 2 tablets (40 mg total) by mouth daily. 60 tablet 3  . traMADol (ULTRAM) 50 MG tablet Take 50 mg by mouth every 6 (six) hours as needed for pain.    . TURMERIC-GINGER PO Take 2 tablets by mouth daily.    . hydrALAZINE (APRESOLINE) 100 MG tablet Take 1 tablet (100 mg total) by mouth every 8 (eight) hours. 180 tablet 0   No facility-administered medications prior to visit.    Review of Systems  Review of Systems  Constitutional: Positive for fatigue and unexpected weight change. Negative for chills and fever.  Respiratory: Negative for cough, chest tightness, shortness of breath and wheezing.   Cardiovascular: Negative for leg swelling.   Physical Exam  BP 118/82 (BP Location: Left Arm, Cuff Size: Normal)   Pulse 88   Temp (!) 97.5 F (36.4 C) (Oral)   Ht  '5\' 1"'  (1.549 m)   Wt 248 lb (112.5 kg)   LMP  (LMP Unknown) Comment: pmb last 2-3 months  SpO2 96%   BMI 46.86 kg/m  Physical Exam Constitutional:      Appearance: Normal appearance.  HENT:     Head: Normocephalic and atraumatic.     Mouth/Throat:     Mouth: Mucous membranes are moist.     Pharynx: Oropharynx is clear.  Cardiovascular:     Rate and Rhythm: Normal rate and regular rhythm.  Pulmonary:     Effort: Pulmonary effort is normal.  Breath sounds: No wheezing.     Comments: Lungs clear Musculoskeletal:        General: Normal range of motion.  Skin:    General: Skin is warm and dry.  Neurological:     General: No focal deficit present.     Mental Status: She is alert and oriented to person, place, and time. Mental status is at baseline.  Psychiatric:        Mood and Affect: Mood normal.        Behavior: Behavior normal.        Thought Content: Thought content normal.        Judgment: Judgment normal.      Lab Results:  CBC    Component Value Date/Time   WBC 6.1 04/08/2020 0726   RBC 4.70 04/08/2020 0726   HGB 14.5 04/08/2020 0726   HCT 45.7 04/08/2020 0726   PLT 251 04/08/2020 0726   MCV 97.2 04/08/2020 0726   MCH 30.9 04/08/2020 0726   MCHC 31.7 04/08/2020 0726   RDW 14.4 04/08/2020 0726   LYMPHSABS 1.6 12/17/2019 1655   MONOABS 0.4 12/17/2019 1655   EOSABS 0.1 12/17/2019 1655   BASOSABS 0.0 12/17/2019 1655    BMET    Component Value Date/Time   NA 141 04/05/2020 1350   K 4.5 04/05/2020 1350   CL 104 04/05/2020 1350   CO2 24 04/05/2020 1350   GLUCOSE 107 (H) 04/05/2020 1350   BUN 30 (H) 04/05/2020 1350   CREATININE 2.09 (H) 04/05/2020 1350   CALCIUM 9.8 04/05/2020 1350   GFRNONAA 26 (L) 04/05/2020 1350   GFRAA 31 (L) 04/05/2020 1350    BNP    Component Value Date/Time   BNP 958.5 (H) 12/17/2019 1655    ProBNP No results found for: PROBNP  Imaging: DG Chest 2 View  Result Date: 05/11/2020 CLINICAL DATA:  Shortness of breath  EXAM: CHEST - 2 VIEW COMPARISON:  12/19/2019 FINDINGS: Mild cardiomegaly. Both lungs are clear. The visualized skeletal structures are unremarkable. IMPRESSION: Mild cardiomegaly without acute abnormality of the lungs. Electronically Signed   By: Eddie Candle M.D.   On: 05/11/2020 13:40   US RENAL  Result Date: 04/28/2020 CLINICAL DATA:  54 year old female with stage III B chronic kidney disease. EXAM: RENAL / URINARY TRACT ULTRASOUND COMPLETE COMPARISON:  None. FINDINGS: Right Kidney: Renal measurements: 9.9 x 4.0 x 5.2 cm = volume: 107 mL. Mildly echogenic right renal cortex (image 2). No right hydronephrosis or renal lesion. Left Kidney: Renal measurements: 10.3 x 5.1 x 5.2 cm = volume: 142 mL. Mildly echogenic left kidney similar to the right (image 14). No left hydronephrosis or renal lesion. Bladder: Appears normal for degree of bladder distention. Both ureteral jets detected with Doppler. Other: None. IMPRESSION: Mildly echogenic kidneys compatible with chronic medical renal disease. Electronically Signed   By: Genevie Ann M.D.   On: 04/28/2020 16:41     Assessment & Plan:   54 year old female. Past medical history significant for severe pulmonary hypertension and OSA.  Patient presents today with reports of oxygen desaturation into 80's on 8 L. She presented to the office today without her oxygen, she was placed on oxygen tank from our office and O2 level was 96% 8L.  She in interested in [employer] continuous concentrator. She has no acute respiratory complaints.  Chest x-ray today showed clear lungs with cardiomegaly. She reports 10 lbs weight gain over the last month.  She is maintained on sildenafil, OPSUMIT, torsemide and spironolactone for pulmonary  hypertension.  She is unsure if she is currently taking torsemide 40 mg daily, she will go home and verify whether or not she is taking this. If she is not she will resume it.  We will be checking BNP and BMP today.  She will need to follow-up with  cardiology as soon as possible.  She is 100% compliant with her CPAP use. She had bronchodilator response on pulmonary function testing and reports benefit from short acting bronchodilator. We will try her on a sample of ICS/LABA inhaler, she was given Breo 200 today.    OSA (obstructive sleep apnea) -Patient is 100% compliant with CPAP and reports benefit from use -Current CPAP pressure 9 cm H2O; AHI 0.7 -No changes today -Continue to wear CPAP every night for minimum 4 to 6 hours or more   Pulmonary hypertension ([place]) -Patient reports 10 pound weight gain over the last month with oxygen desaturation on 8 L -Maintained on sildenafil 20 mg daily, OPSUMIT 10 mg daily, torsemide 40 mg daily and spironolactone 12 mg daily -She is unsure if she is currently taking her torsemide, advised patient to verify that she is taking this and if she is not to resume medication -Chest x-ray showed clear lungs with no acute cardiopulmonary process -Checking be met in BNP today -We will follow-up with patient by telephone after labs resulted -Advised patient contact cardiology for an appointment with them as soon as possible  Chronic respiratory failure with hypoxia [facility]) -Patient presented to the office today without oxygen.  She was placed on oxygen tank from our office ([facility]).  Her O2 was 96% on 8 L. She is not a candidate for portable oxygen concentrator. Recommend she look into getting Inogen continuous oxygen concentrator  Restrictive lung disease - Patient had moderate to severe restriction on most recent pulmonary function testing with positive bronchodilator response.  This is likely due to body habitus and/or underlying asthma. - High-resolution CAT scan showed no evidence of interstitial lung disease. - We will try her on sample Breo 200 one puff daily - Follow-up televisit in 2 weeks to reassess response to addition maintenance inhaler  Martyn Ehrich, NP 05/11/2020

## 2020-05-11 NOTE — Assessment & Plan Note (Signed)
-  Patient reports 10 pound weight gain over the last month with oxygen desaturation on 8 L -Maintained on sildenafil 20 mg daily, OPSUMIT 10 mg daily, torsemide 40 mg daily and spironolactone 12 mg daily -She is unsure if she is currently taking her torsemide, advised patient to verify that she is taking this and if she is not to resume medication -Chest x-ray showed clear lungs with no acute cardiopulmonary process -Checking be met in BNP today -We will follow-up with patient by telephone after labs resulted -Advised patient contact cardiology for an appointment with them as soon as possible

## 2020-05-11 NOTE — Assessment & Plan Note (Addendum)
-  Patient presented to the office today without oxygen.  She was placed on oxygen tank from our office ([facility]).  Her O2 was 96% on 8 L. She is not a candidate for portable oxygen concentrator. Recommend she look into getting [employer]ogen continuous oxygen concentrator

## 2020-05-11 NOTE — Assessment & Plan Note (Signed)
-   Patient had moderate to severe restriction on most recent pulmonary function testing with positive bronchodilator response.  This is likely due to body habitus and/or underlying asthma. - High-resolution CAT scan showed no evidence of interstitial lung disease. - We will try her on sample Breo 200 one puff daily - Follow-up televisit in 2 weeks to reassess response to addition maintenance inhaler

## 2020-05-11 NOTE — Telephone Encounter (Signed)
Prescriber portion of Opsumit [employer] form completed and faxed back to [facility].  Audry Riles, PharmD, BCPS, BCCP, CPP Heart Failure Clinic Pharmacist [phone]

## 2020-05-13 ENCOUNTER — Telehealth: Payer: Self-pay | Admitting: Pulmonary Disease

## 2020-05-13 NOTE — Telephone Encounter (Signed)
Called and spoke with pt about the info she stated. Nothing further needed.

## 2020-05-13 NOTE — Telephone Encounter (Signed)
Pt called to let us know a few things: 1. The concentrator that she has at home was not working properly but she had the company come out and it is fixed. 2. She needs to confirm that she will NOT be charged for the tank that we let her borrow -  3. The info on the [employer] that she received from [place] is incorrect - states that it is not for a constant flow - only pulse O2.  Please advise patient

## 2020-05-26 ENCOUNTER — Telehealth: Payer: Self-pay | Admitting: Primary Care

## 2020-05-26 MED ORDER — BREO ELLIPTA 200-25 MCG/INH IN AEPB: 1.0000 | INHALATION_SPRAY | Freq: Every day | RESPIRATORY_TRACT | 5 refills | Status: DC

## 2020-05-26 NOTE — Telephone Encounter (Signed)
cont'd  With o2 and DME-- pt is asking if she can get put on a lung transplant list. Please advise.

## 2020-05-26 NOTE — Telephone Encounter (Signed)
She has an apt with Dr. Loanne Drilling on 8/12. She will need to make this decision and can discuss with her then

## 2020-05-26 NOTE — Telephone Encounter (Signed)
Spoke with the pt and notified of recs per Beth and she verbalized understanding   

## 2020-05-26 NOTE — Telephone Encounter (Signed)
Spoke with the pt  She asks for rx for [facility]  Rx sent to pharm  She is c/o trouble with dealing with DME- upset about having to travel with o2 tanks b/c she does not qualify for POC  She is asking if we can go ahead and refer to lung transplant center b/c she is tired of living this way  Dr Loanne Drilling still out of the office and Eustaquio Maize has seen pt most recently so will send to her  Please advise thanks

## 2020-06-01 ENCOUNTER — Ambulatory Visit (INDEPENDENT_AMBULATORY_CARE_PROVIDER_SITE_OTHER): Payer: Medicare Other | Admitting: Primary Care

## 2020-06-01 ENCOUNTER — Other Ambulatory Visit: Payer: Self-pay

## 2020-06-01 ENCOUNTER — Telehealth: Payer: Self-pay | Admitting: Primary Care

## 2020-06-01 ENCOUNTER — Encounter: Payer: Self-pay | Admitting: Primary Care

## 2020-06-01 DIAGNOSIS — I272 Pulmonary hypertension, unspecified: Secondary | ICD-10-CM | POA: Diagnosis not present

## 2020-06-01 DIAGNOSIS — J9611 Chronic respiratory failure with hypoxia: Secondary | ICD-10-CM

## 2020-06-01 NOTE — Telephone Encounter (Signed)
Dr. Loanne Drilling this is a patient of yours with HX pulmonary hypertension and chronic respiratory failure. She is on 8L with exertion. She would like to be referred for possible consideration for lung transplant. Are you ok with me referring to [facility]?

## 2020-06-01 NOTE — Progress Notes (Signed)
Virtual Visit via Telephone Note  I connected with Dawn Fowler on 06/01/20 at  3:00 PM EDT by telephone and verified that I am speaking with the correct person using two identifiers.  Location: Patient: Home Provider: Office   I discussed the limitations, risks, security and privacy concerns of performing an evaluation and management service by telephone and the availability of in person appointments. I also discussed with the patient that there may be a patient responsible charge related to this service. The patient expressed understanding and agreed to proceed.   History of Present Illness:  54 year old female, former smoker.  Past medical history significant for severe obstructive sleep apnea (not on CPAP), COVID-19 infection requiring hospitalization, chronic respiratory failure with hypoxia, pulmonary hypertension, right ventricular failure, chronic kidney disease stage III, obesity. Patient of Dr. Loanne Drilling, last seen in March 2021.  Reviewed note from 11/15/2018 by Dr. Kathrynn Humble, MD in pulmonary for pulmonary hypertension.  She was on Opsumit 10 mg daily, sildenafil 20 mg q8h and Procardia 60 mg daily.  She had a right heart cath done in March 2018 (RA 19 PA 76/40/48 PCWP 20 CO 4.02L/min CI 1.9 No vasodilator trial noted)and August 02 2018 however her numbers are cut off on fax. RVSP 57 and PVR 3.09 Wood units. She has a documented 6-minute walk test demonstrating 276 m on 8 L of O2 with SPO2 of 87%.  During these visits she has discussed bariatric surgery and weight loss.  CT Chest without contrast 07/26/18 -(report only) enlarged MPA and centroal pulmonary arteries consistent with hx of pulmonary hypertension.  Stable predominantly basilar mosaic attenuation reflects either small vessel or small airways disease.  Persistent predominant subcarinal, paraesophageal and bilateral infrahilar lymph node enlargement unchanged from 4 months ago.  Stable small perifissural nodule likely representing  intranodular lymph node.  Other concerning parenchymal lesions not seen. -Report does not mention any interstitial lung disease.  Previous [facility] pulmonary encounter: 01/06/20- DR. ELLISON Since our last visit, she has been hospitalized and aggressively diuresed with removal of 30lbs for acute on chronic diastolic heart failure with RV dysfunction secondary to severe pulmonary hypertension Group 2 and 3. Heart failure team restarted Opsumit and tadalafil after diuresis. In-patient PFTs obtained with no evidence of obstruction but reduced FEV1 and FVC and DLCO. She is compliant with her current CPAP. She continues to have shortness of breath. Currently on 4L O2 at rest and 6L on exertion. Trying to limit her salt intake. She is monitoring her weight for fluid accumulation.  7/13/2021Volanda Napoleon, NP Patient presents today for an acute office visit with reports of low oxygen readings at home. She has a history of severe pulmonary hypertension group 2-3 and severe OSA. She was ordered for split night sleep study at her last visit with Dr. Loanne Drilling in March. Advised she continue CPAP at current setting until study. PFTs showed restrictive lung disease with positive BD reponse, consider ICS/LABA if dyspnea persists. No evidence of ILD on HRCT.   She states that her O2 level desaturated to 80% on 8L while exerting herself.  She feels well enough to come to office for a visit today, we order stat CXR to be completed prior to [place]. She is compliant with CPAP. She wears CPAP every night. Her DME company previous was [employer]. She has not had split night sleep study d/t covid. She is unsure if she is currently taking her Torsdemide. Reports her weight is up 10 lbs in the last month. She does not have  any increased leg swelling. Her O2 will drop into 70-80s on oxygen, she wears 4L at rest and 8L on exertion. She states that she is basically on 8L at home. She does not wear oxygen when she is in the car because she is able to use  A.C. She came today without oxygen because she ran out of oxygen tanks, she has a home concentrator.   She has no complaints of acute respiratory symptoms. Denies cough, chest tightness or wheezing. She has had episode where she needed to use breathing treatment. She has used that twice in the last two months. She is very interested in breast reduction surgery, she states that she breaths a lot easier when she lifts pressure off of her chest.    She is maintained on Tadalafil, Opsumit, torsemide 40mg  daily and spironolactone 12.5mg  daily prescribed by Dr. Aundra Dubin office. Pulmonary hypertension medications managed by Cardiology. She was last seen by heart care clinic on May 20th, volume status appeared elev ated, unclear why torsemide was stopped- she was restarted on this 40mg  daily. They recommended follow-up in 6 weeks, she is over d/t for follow-up with heart failure clinic.    Airview download 02/11/2020-05/10/2020 Usage 90/90; 100% greater than 4 hours Average usage 11 hours 11 minutes CPAP pressure 9 cm H2O AHI 0.8   06/01/2020- Interim hx  Patient contacted today for 2 week follow-up. She was given Breo sample at last visit and felt that this significantly improved her breathing. We have sent in a prescription for [facility] which she has not picked up yet. She is very intersted in being considered for lung transplant. She has hx pulmonary hypertension and chronic respiratory failure on 4-8L oxygen. Medications are managed by cardiology. She is a former light smoker. Her highest weight was 320 lbs in 2017. She has lost approx 70 lbs since then.    Observations/Objective:  - Able to speak in full sentences; no overt shortness of breath or wheezing    Imaging: 12/25/2014-VQ scan-mild obstructive airways disease, very low probability of PE 12/18/2014-CTA anterior-no evidence of PE, cardiomegaly, dependent airspace trapping which may be related to morbid obesity or small airways disease ^Unable to  view images but can read radiology report  10/29/2019-chest x-ray-marked cardiomegaly with vascular congestion, dilated pulmonary arteries which can be seen in patients with elevated pulmonary pressures, no large focal infiltrate  PFT: 02/11/2015-office spirometry-FVC 2.14 L, FEV1 1.78, ratio 83  12/22/19  FVC 1.43 (48%) FEV1 1.27 (54%) Ratio 87  TLC 64% DLCO 32% Interpretation: No evidence of obstructive defect however significant response to bronchodilators suggestive of asthma. Co-comitant restrictive lung defect with reduced gas exchange also present.  Echocardiogram: 12/21/2014- LV ejection fraction 60 to 65%, moderate pulmonary hypertension, moderate diastolic dysfunction    02/04/13 RHC Procedural Findings: Hemodynamics (mmHg) RA mean 14 RV 87/21 PA 89/33, mean 54 PCWP mean 34 Oxygen saturations: SVC 63% PA 61% AO 94% Cardiac Output (Fick) 5.17  Cardiac Index (Fick) 2.22 PVR 3.87 WU   Assessment and Plan:  Asthma: - PFTs showed restrictive lung disease with positive BD reponse - HRCT showed no evidence of ILD - Clinically improved with Breo Elipta 200, sent in RX  Pulmonary HTN: - Group 2 and 3/ managed by cardiology (Dr. Aundra Dubin) - Continues Tadalafil, Opsumit, torsemide 40mg  daily and spironolactone 12.5mg  daily   Chronic respiratory failure - Continue 4L oxygen at rest and 8L on exertoin - Referring to [facility] for possible consideration for lung transplant   OSA: - Maintained on  CPAP pressure 9 cm H2O   Follow Up Instructions:   - Refer to [facility] for consideration for lung transplant/ follow up afterwards with Dr. Loanne Drilling   I discussed the assessment and treatment plan with the patient. The patient was provided an opportunity to ask questions and all were answered. The patient agreed with the plan and demonstrated an understanding of the instructions.   The patient was advised to call back or seek an in-person evaluation if the symptoms worsen or if the  condition fails to improve as anticipated.  I provided 22 minutes of non-face-to-face time during this encounter.   Martyn Ehrich, NP

## 2020-06-01 NOTE — Patient Instructions (Signed)
Asthma: - Clinically improved with Breo Elipta 200, sent in RX  Pulmonary HTN: - Group 2 and 3/ managed by cardiology (Dr. Aundra Dubin) - Continues Tadalafil, Opsumit, torsemide 40mg  daily and spironolactone 12.5mg  daily   Chronic respiratory failure - Continue 4L oxygen at rest and 8L on exertoin - Referring to [facility] for possible consideration for lung transplant   OSA: - Maintained on CPAP pressure 9 cm H2O  Follow-up: 3 months with Dr. Loanne Drilling or sooner if needed

## 2020-06-01 NOTE — Telephone Encounter (Signed)
I called patient regarding request for lung transplant evaluation. We discussed expectations of evaluation including the commitment to current therapy, need for strong social support and financial requirement. We also discussed possibility that she may not qualify for transplant. At this time, patient wishes to discuss her options with a transplant provider.  Plan Please refer patient to [facility] for transplant.  Rodman Pickle, M.D. [facility] Pulmonary/Critical Care Medicine 06/01/2020 7:41 PM

## 2020-06-02 NOTE — Telephone Encounter (Signed)
Wonderful, thanks so much Opal Sidles

## 2020-06-02 NOTE — Telephone Encounter (Signed)
Orders placed to [facility] for lung transplant consult.

## 2020-06-07 ENCOUNTER — Ambulatory Visit (HOSPITAL_COMMUNITY)
Admission: RE | Admit: 2020-06-07 | Discharge: 2020-06-07 | Disposition: A | Discharge status: Home / Self Care | Payer: Medicare Other | Source: Ambulatory Visit | Attending: Cardiology | Admitting: Cardiology

## 2020-06-07 ENCOUNTER — Other Ambulatory Visit: Payer: Self-pay

## 2020-06-07 DIAGNOSIS — M7989 Other specified soft tissue disorders: Secondary | ICD-10-CM

## 2020-06-07 DIAGNOSIS — I272 Pulmonary hypertension, unspecified: Secondary | ICD-10-CM | POA: Diagnosis not present

## 2020-06-07 DIAGNOSIS — J9611 Chronic respiratory failure with hypoxia: Secondary | ICD-10-CM | POA: Diagnosis not present

## 2020-06-07 MED ORDER — TORSEMIDE 20 MG PO TABS
ORAL_TABLET | ORAL | 3 refills | Status: DC
Start: 2020-06-07 — End: 2020-06-30

## 2020-06-07 MED ORDER — HYDRALAZINE HCL 25 MG PO TABS
25.0000 mg | ORAL_TABLET | Freq: Three times a day (TID) | ORAL | 3 refills | Status: DC
Start: 2020-06-07 — End: 2020-08-20

## 2020-06-07 NOTE — Patient Instructions (Signed)
Increase Torsemide to 40 mg (2 tabs) in AM and 20 mg (1 tab) in PM  Change Hydralazine to 25 mg Three times a day   Continue Nifedipine 60 mg Twice daily   Continue Tadalafil 20 mg Daily  Your physician has requested that you have a lower or upper extremity venous duplex. This test is an ultrasound of the veins in the legs or arms. It looks at venous blood flow that carries blood from the heart to the legs or arms. Allow one hour for a Lower Venous exam. Allow thirty minutes for an Upper Venous exam. There are no restrictions or special instructions.  Lab work needed in 1 week  Your physician recommends that you schedule a follow-up appointment in: 3 weeks  If you have any questions or concerns before your next appointment please send [place] a message through [place] or call our office at [phone].    TO LEAVE A MESSAGE FOR THE NURSE SELECT OPTION 2, PLEASE LEAVE A MESSAGE INCLUDING: . YOUR NAME . DATE OF BIRTH . CALL BACK NUMBER . REASON FOR CALL**this is important as we prioritize the call backs  [place] AS LONG AS YOU CALL BEFORE 4:00 PM  At the [facility], you and your health needs are our priority. As part of our continuing mission to provide you with exceptional heart care, we have created designated Provider Care Teams. These Care Teams include your primary Cardiologist (physician) and Advanced Practice Providers (APPs- Physician Assistants and Nurse Practitioners) who all work together to provide you with the care you need, when you need it.   You may see any of the following providers on your designated Care Team at your next follow up: Marland Kitchen Dr Glori Bickers . Dr [name] . Darrick Grinder, NP . Lyda Jester, PA . Audry Riles, PharmD   Please be sure to bring in all your medications bottles to every appointment.

## 2020-06-07 NOTE — Progress Notes (Signed)
Orders per Dr Aundra Dubin:   Increase Torsemide to 40 mg in AM and 20 mg in PM  Change Hydralazine to 25 mg Three times a day   Continue Nifedipine 60 mg Twice daily   Continue Adcirca 20 mg daily  Venous US for ?DVT in R leg  BMET 1 weeks  F/u in office in 3 weeks   I called and discussed w/pt, she is aware, agreeable, and verbalized understanding, request instructions be sent to her via mychart as well, AVS sent. Message to schedulers to arrange appts.

## 2020-06-08 NOTE — Progress Notes (Signed)
Heart Failure TeleHealth Note  Due to national recommendations of social distancing due to [place] 19, Audio/video telehealth visit is felt to be most appropriate for this patient at this time.  See MyChart message from today for patient consent regarding telehealth for [facility].  Date:  06/08/2020   ID:  Dawn Fowler, DOB 10-21-1966, MRN [id number]  Location: Home  Provider location: [facility] Type of Visit: Established patient   PCP:  Hayden Rasmussen, MD  Cardiologist:  Dr. Aundra Dubin  Chief Complaint: Shortness of breath   History of Present Illness: Dawn Fowler is a 54 y.o. female who presents via audio/video conferencing for a telehealth visit today.     She denies symptoms worrisome for COVID 19.   She has a long history of severe OSA and suspected OHS/OSA.  She is on CPAP at home and 8L home oxygen (says she has been on oxygen for "years.").  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  She additionally has significant HTN.  She had been on Opsumit 10 + tadalafil 20 mg daily but ran out of this prior to her 2/21 admission. She also has a history of prior DVT after long train ride, has had 2 negative V/Q scans.  She was hospitalized for COVID-19 at [facility] in 1/21.   She was seen by [facility] after discharge from [place] admission and was noted to be volume overloaded, Lasix was changed to torsemide but she was unable to get to pharmacy to pick up. Subsequently, she developed progressive wt gain, nearly 20 lb and increased dyspnea, prompting her to report back to the ED in 2/21, where she was found to be in acute on chronic diastolic HF and readmitted for IV diuresis and AHF consultation. Echo in 2/21 showed LVEF 55-60%, moderate RV dilation/moderate RV dysfunction. [place] 2/21 showed elevated left  and right heart filling pressures with severe pulmonary hypertension. PFTs completed showing severe restriction and severely decreased DLCO. High resolution CT was concerning  for small vessel disease, no ILD, and concerning for PAH.  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 She was continued on IV diuretics and had good response, diuresing 32 lb. She was transitioned to torsemide 40 mg/20 mg. Opsumit and tadalafil restarted.   Currently, she is short of breath with fasting walking, this has been getting worse.  Weight has trended up.  Generally gets around the house without dyspnea.  She reports asymmetric swelling in her right leg that started in the last week after a care trip to [place].  No chest pain.  No lightheadedness or syncope.  She has been taking hydralazine 100 mg just once a day.   Labs (6/21): K 4.5, creatinine 2.09  ECG (3/21, personally reviewed): NSR, poor RWP  PMH: 1. OHS/OSA: Uses CPAP and home oxygen.  2. CKD stage IV 3. COVID-19 PNA in 12/20 4. Restrictive lung disease: PFTs (2/21) with severe restriction, severely decreased DLCO.  - High resolution CT chest (2/21): no interstitial lung disease, "small vessel disease."  5. Gout 6. HTN 7. DVT: Remote, after train ride.  8. Pulmonary hypertension: RHC (2/21) with mean RA 14, PA 89/33 mean 54, mean PCWP 34, CI 2.22, PVR 3.87 WU => mixed pulmonary  arterial/pulmonary venous hypertension.  - V/Q scan (2/21): No evidence for chronic PE.  - Echo (2/21): EF 55-60%, moderately dilated RV with moderately decreased systolic function.  - HIV and rheumatologic serologies negative.    Current Outpatient Medications  Medication Sig Dispense Refill  . acetaminophen (TYLENOL) 500 MG tablet Take 1,000 mg by mouth every 6 (six) hours as needed for moderate pain or headache.    . albuterol (VENTOLIN HFA) 108 (90 Base) MCG/ACT  inhaler Inhale 2 puffs into the lungs every 6 (six) hours as needed for wheezing or shortness of breath. 6.7 g 0  . allopurinol (ZYLOPRIM) 100 MG tablet Take 100 mg by mouth daily.    . Biotin w/ Vitamins C & E (HAIR SKIN & NAILS GUMMIES PO) Take 2 tablets by mouth daily.    . Cholecalciferol (VITAMIN D-3) 125 MCG (5000 UT) TABS Take 5,000 mg by mouth daily.    . colchicine 0.6 MG tablet Take 0.6 mg by mouth daily as needed (gout flare).    . fluticasone furoate-vilanterol (BREO ELLIPTA) 200-25 MCG/INH AEPB Inhale 1 puff into the lungs daily. 60 each 5  . gabapentin (NEURONTIN) 100 MG capsule Take 300 mg by mouth at bedtime.     . hydrALAZINE (APRESOLINE) 25 MG tablet Take 1 tablet (25 mg total) by mouth 3 (three) times daily. 270 tablet 3  . ipratropium-albuterol (DUONEB) 0.5-2.5 (3) MG/3ML SOLN Take 3 mLs by nebulization every 6 (six) hours as needed. (Patient taking differently: Take 3 mLs by nebulization every 6 (six) hours as needed (shortness of breath). ) 360 mL 5  . macitentan (OPSUMIT) 10 MG tablet Take 1 tablet (10 mg total) by mouth daily. 30 tablet 11  . Multiple Vitamins-Minerals (ADULT GUMMY) CHEW Chew 2 tablets by mouth daily.    Marland Kitchen NIFEdipine (ADALAT CC) 60 MG 24 hr tablet Take 60 mg by mouth 2 (two) times daily.    . OXYGEN Inhale 4-8 L into the lungs continuous.    . pantoprazole (PROTONIX) 40 MG tablet Take 40 mg by mouth daily.    . potassium chloride SA (KLOR-CON) 20 MEQ tablet Take 40 mEq by mouth 2 (two) times daily.    Marland Kitchen PREMARIN 0.625 MG tablet Take 0.625 mg by mouth daily.    Marland Kitchen spironolactone (ALDACTONE) 25 MG tablet Take 0.5 tablets (12.5 mg total) by mouth daily. 45 tablet 1  . tadalafil, PAH, (ALYQ) 20 MG tablet Take 1 tablet (20 mg total) by mouth daily. 30 tablet 11  . telmisartan (MICARDIS) 40 MG tablet     . torsemide (DEMADEX) 20 MG tablet Take 2 tablets (40 mg total) by mouth in the morning AND 1 tablet (20 mg total) every evening. 90 tablet 3  . traMADol  (ULTRAM) 50 MG tablet Take 50 mg by mouth every 6 (six) hours as needed for pain.    . TURMERIC-GINGER PO Take 2 tablets by mouth daily.     No current facility-administered medications for this encounter.    Allergies  Allergen Reactions  . Adhesive [Tape] Hives    Tolerates paper tape  . Succinylcholine Other (See Comments)    Have trouble waking up  . Tizanidine Hives      Social History   Socioeconomic History  . Marital status: Single    Spouse name: Not on file  . Number of children: Not on file  . Years of education: Not on file  . Highest education level: Not on file  Occupational History  . Not  on file  Tobacco Use  . Smoking status: Former Research scientist (life sciences)  . Smokeless tobacco: Never Used  . Tobacco comment: only smoked for fun in highschool not even one a day  Vaping Use  . Vaping Use: Never used  Substance and Sexual Activity  . Alcohol use: Yes    Comment: occ  . Drug use: Not Currently  . Sexual activity: Yes    Partners: Male    Comment: 1st intercourse- 48, partners- 62, current partner- 10 yrs   Other Topics Concern  . Not on file  Social History Narrative  . Not on file   Social Determinants of Health   Financial Resource Strain:   . Difficulty of Paying Living Expenses:   Food Insecurity:   . Worried About Charity fundraiser in the Last Year:   . Arboriculturist in the Last Year:   Transportation Needs:   . Film/video editor (Medical):   Marland Kitchen Lack of Transportation (Non-Medical):   Physical Activity:   . Days of Exercise per Week:   . Minutes of Exercise per Session:   Stress:   . Feeling of Stress :   Social Connections:   . Frequency of Communication with Friends and Family:   . Frequency of Social Gatherings with Friends and Family:   . Attends Religious Services:   . Active Member of Clubs or Organizations:   . Attends Archivist Meetings:   Marland Kitchen Marital Status:   Intimate Partner Violence:   . Fear of Current or Ex-Partner:    . Emotionally Abused:   Marland Kitchen Physically Abused:   . Sexually Abused:       Family History  Problem Relation Age of Onset  . COPD Father   . Cancer Mother        gallbladder and bladder   . Hypertension Brother   . Breast cancer Other   . Diabetes Son   . Breast cancer Cousin   . Breast cancer Cousin   . Pulmonary Hypertension Neg Hx     There were no vitals filed for this visit. Wt Readings from Last 3 Encounters:  05/11/20 112.5 kg (248 lb)  04/08/20 114.8 kg (253 lb 1.4 oz)  03/18/20 115.1 kg (253 lb 12.8 oz)   Exam:  (Video/Tele Health Call; Exam is subjective and or/visual.) General:  Speaks in full sentences. No resp difficulty. Lungs: Normal respiratory effort with conversation.  Abdomen: Non-distended per patient report Extremities: Right lower leg larger than left.  Neuro: Alert & oriented x 3.   ASSESSMENT & PLAN:  1. Chronic diastolic CHF with significant RV dysfunction: Echo in 2/21 with LVEF 55-60%, moderate RV dilation/moderate RV dysfunction.  [place] 2/18 showed elevated left and right heart filling pressures with severe pulmonary hypertension.  Suspect mixed pulmonary venous/pulmonary arterial hypertension in setting of significant LV diastolic dysfunction (PVR 3.87 WU). NYHA class III symptoms, somewhat worse recently.  Weight is up.   - Increase torsemide to 40 qam/20 qpm.  BMET in 1 week.  2. Pulmonary hypertension: suspect that this is a mixed picture with group 2 and group 3 PH (related to diastolic LV dysfunction and OHS/OSA).  Cannot rule out group 1 PH. V/Q scan not suggestive of chronic PEs and high resolution CT chest in 2/21 was not suggestive of ILD. HIV and rheumatologic serologies negative. 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 PFTs completed in 2/21 showed severe restriction, severely decreased DLCO (?restriction due to body habitus). She is on  Opsumit and tadalafil given concern for group 1 PH component.  - Suspect  that the key to treatment will be maintaining oxygen saturation with home O2 and CPAP. Weight loss also would be helpful.  - Continue Opsumit 10 mg daily. - Continue tadalafil 20 mg daily, will try to increase at next in-person appt.   3. HTN: - Continue nifedipine CR 60 mg bid.  - Stop hydralazine 100 mg once daily, replace with hydralazine 25 mg tid.  4. CKD stage 3b-4: baseline SCr ~2 - BMET 1 week.  - Followup with nephrology.  5. OSA: compliant w/ CPAP - Continue CPAP  6. Asymmetric left leg swelling: I will arrange for venous dopplers.  She has a remote history of DVT and has been on a recent long car ride.   COVID screen The patient does not have any symptoms that suggest any further testing/ screening at this time.  Social distancing reinforced today.  Patient Risk: After full review of this patients clinical status, I feel that they are at moderate risk for cardiac decompensation at this time.  Relevant cardiac medications were reviewed at length with the patient today. The patient does not have concerns regarding their medications at this time.   Recommended follow-up:  3 wks in office.   Today, I have spent 21 minutes with the patient with telehealth technology discussing the above issues .    Signed, [name], MD  06/08/2020  [place] [address] [facility] and [place] [address] [phone] (office) [phone] (fax)

## 2020-06-09 ENCOUNTER — Ambulatory Visit (HOSPITAL_COMMUNITY)
Admission: RE | Admit: 2020-06-09 | Discharge: 2020-06-09 | Disposition: A | Discharge status: Home / Self Care | Payer: Medicare Other | Source: Ambulatory Visit | Attending: Family Medicine | Admitting: Family Medicine

## 2020-06-09 ENCOUNTER — Other Ambulatory Visit: Payer: Self-pay

## 2020-06-09 ENCOUNTER — Other Ambulatory Visit (HOSPITAL_COMMUNITY): Payer: Self-pay | Admitting: *Deleted

## 2020-06-09 DIAGNOSIS — M7989 Other specified soft tissue disorders: Secondary | ICD-10-CM | POA: Insufficient documentation

## 2020-06-09 DIAGNOSIS — I82409 Acute embolism and thrombosis of unspecified deep veins of unspecified lower extremity: Secondary | ICD-10-CM

## 2020-06-09 HISTORY — DX: Acute embolism and thrombosis of unspecified deep veins of unspecified lower extremity: I82.409

## 2020-06-09 MED ORDER — APIXABAN 5 MG PO TABS: 5.0000 mg | ORAL_TABLET | Freq: Two times a day (BID) | ORAL | 3 refills | Status: DC

## 2020-06-09 NOTE — Progress Notes (Signed)
Lower extremity venous RT study completed  Preliminary results relayed to Aundra Dubin, MD.  See CV Proc for preliminary results report.   Darlin Coco

## 2020-06-10 ENCOUNTER — Encounter: Payer: Self-pay | Admitting: Pulmonary Disease

## 2020-06-10 ENCOUNTER — Ambulatory Visit (INDEPENDENT_AMBULATORY_CARE_PROVIDER_SITE_OTHER): Payer: Medicare Other | Admitting: Pulmonary Disease

## 2020-06-10 ENCOUNTER — Ambulatory Visit: Payer: Medicare Other | Admitting: Pulmonary Disease

## 2020-06-10 DIAGNOSIS — I82431 Acute embolism and thrombosis of right popliteal vein: Secondary | ICD-10-CM | POA: Diagnosis not present

## 2020-06-10 DIAGNOSIS — I5033 Acute on chronic diastolic (congestive) heart failure: Secondary | ICD-10-CM | POA: Diagnosis not present

## 2020-06-10 DIAGNOSIS — G4733 Obstructive sleep apnea (adult) (pediatric): Secondary | ICD-10-CM

## 2020-06-10 DIAGNOSIS — I272 Pulmonary hypertension, unspecified: Secondary | ICD-10-CM

## 2020-06-10 DIAGNOSIS — Z9989 Dependence on other enabling machines and devices: Secondary | ICD-10-CM

## 2020-06-10 DIAGNOSIS — I5032 Chronic diastolic (congestive) heart failure: Secondary | ICD-10-CM | POA: Insufficient documentation

## 2020-06-10 NOTE — Progress Notes (Signed)
Virtual Visit via Telephone Note  I connected with Haniya Fern on 06/10/20 at  9:00 AM EDT by telephone and verified that I am speaking with the correct person using two identifiers.  Location: Patient: Home Provider: Home office   I discussed the limitations, risks, security and privacy concerns of performing an evaluation and management service by telephone and the availability of in person appointments. I also discussed with the patient that there may be a patient responsible charge related to this service. The patient expressed understanding and agreed to proceed.  Assessment and Plan: As noted below   I discussed the assessment and treatment plan with the patient. The patient was provided an opportunity to ask questions and all were answered. The patient agreed with the plan and demonstrated an understanding of the instructions.   The patient was advised to call back or seek an in-person evaluation if the symptoms worsen or if the condition fails to improve as anticipated.  I provided 30 minutes of non-face-to-face time during this encounter.   Kennidee Heyne Rodman Pickle, MD   Subjective:   PATIENT ID: Hessie Dibble GENDER: female DOB: November 17, 1965, MRN: [id number]   No chief complaint on file.  Reason for Visit: Follow-up visit  Ms. Nimra Puccinelli is a 54 year old female never smoker with severe OSA, hx DVT 2017 s/p Eliquis x 6 months, recent COVID-19 infection requiring hospitalization (10/29/2019-11/03/2019), stage III CKD who presents for follow-up.  Synopsis: Previously followed for Chronic hypoxemic failure 2/2 PH and severe OSA at the [facility] in [place], [place]. Work-up included RHC 12/2016 RA 19 PA 76/40/48 PCWP 20 CO 4.02L/min CI 1.9, V/Q scan (neg), PFTs 06/2018 (mod restrictive defect), CT Chest (no evidence of ILD). Started on Opsumit and tadalafil. Moved to [place] in 2020. She establishing care in Pulmonary clinic, she was found to be in volume overload. She  eventually was admitted for acute diastolic heart failure for IV diuretics. She underwent repeat work-up for her PH including RHC 12/2019 which demonstrated elevated right and left heart filling pressure with severe PH, V/Q scan (neg), PFT 12/2019 (severe restriction and reduced DLCO), HRCT (no evidence of ILD) and negative serologies.  Interval 06/10/20 She continues to have shortness of breath with exertion including walking long distances and bending over. She wears her oxygen 4L at rest and 8L on exertion. Her O2 can drop to 70% off oxygen. She is compliant with her CPAP wearing at least 8 hours nightly.  Cardiology increased torsemide to 60 mg total daily for increased weight/low extremity edema. She completed bilateral dopplers and was diagnosed with acute popliteal DVT for which she was started on Eliquis. Patient recently was in a 6 hour car ride to [place]. She has also previously had a DVT in her right leg in 2017 thought to be related to train ride.  Past Medical History:  Diagnosis Date  . Acute respiratory failure due to COVID-19 ([place]) 10/30/2019  . Arthritis   . CKD (chronic kidney disease), stage III   . Complication of anesthesia    hard to wake up after general anesthesia - had trouble waking up after succinylcholine (consider possibility of pseudocholinesterase deficiency)  . COVID-19 virus detected 11/20/2019   Tested positive for Covid in December/2020 Required hospitalization in December/2020 through January/2021 Received Redemsivir  . Dyspnea   . Family history of adverse reaction to anesthesia    hard to wake up after general anesthesia  . GERD (gastroesophageal reflux disease)   . HLD (hyperlipidemia)   .  HTN (hypertension)   . Pre-diabetes    no meds  . Pulmonary HTN ([place])   . Sleep apnea    on CPAP    Outpatient Medications Prior to Visit  Medication Sig Dispense Refill  . acetaminophen (TYLENOL) 500 MG tablet Take 1,000 mg by mouth every 6 (six) hours as needed for  moderate pain or headache.    . albuterol (VENTOLIN HFA) 108 (90 Base) MCG/ACT inhaler Inhale 2 puffs into the lungs every 6 (six) hours as needed for wheezing or shortness of breath. 6.7 g 0  . allopurinol (ZYLOPRIM) 100 MG tablet Take 100 mg by mouth daily.    Marland Kitchen apixaban (ELIQUIS) 5 MG TABS tablet Take 1 tablet (5 mg total) by mouth 2 (two) times daily. 60 tablet 3  . Biotin w/ Vitamins C & E (HAIR SKIN & NAILS GUMMIES PO) Take 2 tablets by mouth daily.    . Cholecalciferol (VITAMIN D-3) 125 MCG (5000 UT) TABS Take 5,000 mg by mouth daily.    . colchicine 0.6 MG tablet Take 0.6 mg by mouth daily as needed (gout flare).    . fluticasone furoate-vilanterol (BREO ELLIPTA) 200-25 MCG/INH AEPB Inhale 1 puff into the lungs daily. 60 each 5  . gabapentin (NEURONTIN) 100 MG capsule Take 300 mg by mouth at bedtime.     . hydrALAZINE (APRESOLINE) 25 MG tablet Take 1 tablet (25 mg total) by mouth 3 (three) times daily. 270 tablet 3  . ipratropium-albuterol (DUONEB) 0.5-2.5 (3) MG/3ML SOLN Take 3 mLs by nebulization every 6 (six) hours as needed. (Patient taking differently: Take 3 mLs by nebulization every 6 (six) hours as needed (shortness of breath). ) 360 mL 5  . macitentan (OPSUMIT) 10 MG tablet Take 1 tablet (10 mg total) by mouth daily. 30 tablet 11  . Multiple Vitamins-Minerals (ADULT GUMMY) CHEW Chew 2 tablets by mouth daily.    Marland Kitchen NIFEdipine (ADALAT CC) 60 MG 24 hr tablet Take 60 mg by mouth 2 (two) times daily.    . OXYGEN Inhale 4-8 L into the lungs continuous.    . pantoprazole (PROTONIX) 40 MG tablet Take 40 mg by mouth daily.    . potassium chloride SA (KLOR-CON) 20 MEQ tablet Take 40 mEq by mouth 2 (two) times daily.    Marland Kitchen PREMARIN 0.625 MG tablet Take 0.625 mg by mouth daily.    Marland Kitchen spironolactone (ALDACTONE) 25 MG tablet Take 0.5 tablets (12.5 mg total) by mouth daily. 45 tablet 1  . tadalafil, PAH, (ALYQ) 20 MG tablet Take 1 tablet (20 mg total) by mouth daily. 30 tablet 11  . telmisartan  (MICARDIS) 40 MG tablet     . torsemide (DEMADEX) 20 MG tablet Take 2 tablets (40 mg total) by mouth in the morning AND 1 tablet (20 mg total) every evening. 90 tablet 3  . traMADol (ULTRAM) 50 MG tablet Take 50 mg by mouth every 6 (six) hours as needed for pain.    . TURMERIC-GINGER PO Take 2 tablets by mouth daily.     No facility-administered medications prior to visit.    Review of Systems  Constitutional: Negative for chills, diaphoresis, fever, malaise/fatigue and weight loss.  HENT: Negative for congestion.   Respiratory: Positive for shortness of breath. Negative for cough, hemoptysis, sputum production and wheezing.   Cardiovascular: Positive for leg swelling. Negative for chest pain and palpitations.     Objective:   There were no vitals filed for this visit.     Physical Exam: No  apparent distress on the phone  Data Reviewed:  Imaging:  12/25/2014-VQ scan-mild obstructive airways disease, very low probability of PE  12/18/2014-CTA anterior-no evidence of PE, cardiomegaly, dependent airspace trapping which may be related to morbid obesity or small airways disease ^Unable to view images but can read radiology report  07/26/18 CT Chest WO-(report only) enlarged MPA and centroal pulmonary arteries consistent with hx of pulmonary hypertension.  Stable predominantly basilar mosaic attenuation reflects either small vessel or small airways disease.  Persistent predominant subcarinal, paraesophageal and bilateral infrahilar lymph node enlargement unchanged from 4 months ago.  Stable small perifissural nodule likely representing intranodular lymph node.  Other concerning parenchymal lesions not seen. -Report does not mention any interstitial lung disease.  05/11/2020- CXR cardiomegaly. No pulmonary edema or infiltrate  12/21/19 -VQ scan- no evidence of chronic PE  PFT: 02/11/2015-office spirometry-FVC 2.14 L, FEV1 1.78, ratio 83  12/22/19  FVC 1.43 (48%) FEV1 1.27 (54%) Ratio 87   TLC 64% DLCO 32% Interpretation: No evidence of obstructive defect however significant response to bronchodilators suggestive of asthma. Co-comitant severe restrictive lung defect with reduced gas exchange also present.  Echo 12/21/2014 LV ejection fraction 60 to 65%, moderate pulmonary hypertension, moderate diastolic dysfunction  7/41/63  LV EF 55-60%, moderately dilated RV with decreased systolic function    8/45/36 RHC Procedural Findings: Hemodynamics (mmHg) RA mean 14 RV 87/21 PA 89/33, mean 54 PCWP mean 34 Oxygen saturations: SVC 63% PA 61% AO 94% Cardiac Output (Fick) 5.17  Cardiac Index (Fick) 2.22 PVR 3.87 WU  Lower extremity dopplers 06/10/20 Right DVT in popliteal vein  Assessment & Plan:   Discussion: 54 year old female with severe pulmonary hypertension and severe OSA who presents for follow-up. Recently diagnosed with RLE DVT. NYHA class III symptoms. Patient requested transplant referral as noted in prior telephone notes.  Severe Pulmonary Hypertension Group II, III Unable to rule out group I (neg autoimmune serologies) Chronic hypoxemic respiratory failure NYHA class III symptoms --Continue supplemental oxygen as prescribed  --Sleep study as noted below --Continue medications per Cardiology: Opsumit and tadalfil --Referral to [facility]: Patient has been contacted  Severe OSA on CPAP --Re-ordered sleep study for PAP titration --Continue CPAP on current settings until sleep study completed --Advised against driving when sleepy  Moderately severe asthma Restrictive defect likely related to chronic diastolic heart failure --Continue Breo --Continue nebs PRN  Chronic diastolic heart failure, RV dysfunction --Continue diuretics per Cardiology  Recurrent DVT Hx of RLE DVT in 05/2020 and 2017. Diagnosed peri-travel however given recurrence, may need to consider lifelong anticoagulation --Continue Eliquis --Consider Heme consult in the  future  Health Maintenance Immunization History  Administered Date(s) Administered  . Influenza Whole 08/20/2019  . Influenza,inj,Quad PF,6+ Mos 06/25/2018  . PFIZER SARS-COV-2 Vaccination 02/02/2020, 02/24/2020  . Pneumococcal Polysaccharide-23 07/17/2014   CT Lung Screen -never smoker, would not qualify  [place], MD [facility] 06/10/2020 9:02 AM  Office Number [phone]

## 2020-06-10 NOTE — Patient Instructions (Signed)
Severe Pulmonary Hypertension Group II, III Chronic hypoxemic respiratory failure NYHA class III symptoms --Continue supplemental oxygen as prescribed  --Sleep study as noted below --Continue medications per Cardiology: Opsumit and tadalfil --Referral to [facility]: Patient has been contacted  Severe OSA on CPAP --Re-ordered sleep study for PAP titration --Continue CPAP on current settings until sleep study completed --Advised against driving when sleepy  Moderately severe asthma Restrictive defect likely related to chronic diastolic heart failure --Continue Breo --Continue nebs PRN  Chronic diastolic heart failure, RV dysfunction --Continue diuretics per Cardiology  Recurrent DVT Hx of RLE DVT in 05/2020 and 2017. Diagnosed peri-travel however given recurrence, may need to consider lifelong anticoagulation --Continue Eliquis --Consider Heme consult in the future

## 2020-06-15 ENCOUNTER — Encounter (HOSPITAL_COMMUNITY): Payer: Self-pay

## 2020-06-15 ENCOUNTER — Ambulatory Visit (HOSPITAL_COMMUNITY)
Admission: RE | Admit: 2020-06-15 | Discharge: 2020-06-15 | Disposition: A | Discharge status: Home / Self Care | Payer: Medicare Other | Source: Ambulatory Visit | Attending: Internal Medicine | Admitting: Internal Medicine

## 2020-06-15 ENCOUNTER — Other Ambulatory Visit: Payer: Self-pay

## 2020-06-15 DIAGNOSIS — I272 Pulmonary hypertension, unspecified: Secondary | ICD-10-CM | POA: Insufficient documentation

## 2020-06-15 LAB — BASIC METABOLIC PANEL
Anion gap: 10 (ref 5–15)
BUN: 34 mg/dL — ABNORMAL HIGH (ref 6–20)
CO2: 19 mmol/L — ABNORMAL LOW (ref 22–32)
Calcium: 9.5 mg/dL (ref 8.9–10.3)
Chloride: 111 mmol/L (ref 98–111)
Creatinine, Ser: 2.11 mg/dL — ABNORMAL HIGH (ref 0.44–1.00)
GFR calc Af Amer: 30 mL/min — ABNORMAL LOW (ref >60)
GFR calc non Af Amer: 26 mL/min — ABNORMAL LOW (ref >60)
Glucose, Bld: 95 mg/dL (ref 70–99)
Potassium: 4.3 mmol/L (ref 3.5–5.1)
Sodium: 140 mmol/L (ref 135–145)

## 2020-06-25 ENCOUNTER — Encounter (HOSPITAL_COMMUNITY): Payer: Self-pay

## 2020-06-29 ENCOUNTER — Telehealth: Payer: Self-pay | Admitting: Primary Care

## 2020-06-29 DIAGNOSIS — Z9989 Dependence on other enabling machines and devices: Secondary | ICD-10-CM

## 2020-06-29 DIAGNOSIS — G4733 Obstructive sleep apnea (adult) (pediatric): Secondary | ICD-10-CM

## 2020-06-29 NOTE — Telephone Encounter (Signed)
Severe Pulmonary Hypertension Group II, III Unable to rule out group I (neg autoimmune serologies) Chronic hypoxemic respiratory failure NYHA class III symptoms --Continue supplemental oxygen as prescribed  --Sleep study as noted below --Continue medications per Cardiology: Opsumit and tadalfil --Referral to [facility]: Patient has been contacted  Severe OSA on CPAP --Re-ordered sleep study for PAP titration --Continue CPAP on current settings until sleep study completed --Advised against driving when sleepy   Called [facility] to speak with Dawn Fowler but he was on the phone. Left message for Dawn Fowler to return call.

## 2020-06-29 NOTE — Telephone Encounter (Signed)
Spoke with Lynnae Sandhoff at the sleep center. He states patient never had sleep study done. They are unable to complete a PAP titration without some sort of baseline study.   Dr. Loanne Drilling can we please place another split night sleep study for patient to have done.  Also if you can provide a threshold of AHI's since patient is on such a high flow of oxygen it will be hard to titrate her down to see the episodes during study to qualify her for CPAP.   JE please advise

## 2020-06-30 ENCOUNTER — Encounter (HOSPITAL_COMMUNITY): Payer: Self-pay

## 2020-06-30 ENCOUNTER — Other Ambulatory Visit (HOSPITAL_COMMUNITY): Payer: Self-pay | Admitting: *Deleted

## 2020-06-30 ENCOUNTER — Ambulatory Visit (HOSPITAL_COMMUNITY)
Admission: RE | Admit: 2020-06-30 | Discharge: 2020-06-30 | Disposition: A | Discharge status: Home / Self Care | Payer: Medicare Other | Source: Ambulatory Visit | Attending: Cardiology | Admitting: Cardiology

## 2020-06-30 ENCOUNTER — Other Ambulatory Visit: Payer: Self-pay

## 2020-06-30 ENCOUNTER — Encounter (HOSPITAL_COMMUNITY): Payer: Self-pay | Admitting: Cardiology

## 2020-06-30 VITALS — BP 118/84 | HR 85 | Ht 61.0 in | Wt 262.0 lb

## 2020-06-30 DIAGNOSIS — Z7901 Long term (current) use of anticoagulants: Secondary | ICD-10-CM | POA: Insufficient documentation

## 2020-06-30 DIAGNOSIS — Z87891 Personal history of nicotine dependence: Secondary | ICD-10-CM | POA: Insufficient documentation

## 2020-06-30 DIAGNOSIS — Z8616 Personal history of COVID-19: Secondary | ICD-10-CM | POA: Diagnosis not present

## 2020-06-30 DIAGNOSIS — Z79899 Other long term (current) drug therapy: Secondary | ICD-10-CM | POA: Insufficient documentation

## 2020-06-30 DIAGNOSIS — Z86718 Personal history of other venous thrombosis and embolism: Secondary | ICD-10-CM | POA: Diagnosis not present

## 2020-06-30 DIAGNOSIS — G4733 Obstructive sleep apnea (adult) (pediatric): Secondary | ICD-10-CM | POA: Diagnosis not present

## 2020-06-30 DIAGNOSIS — I50810 Right heart failure, unspecified: Secondary | ICD-10-CM

## 2020-06-30 DIAGNOSIS — N184 Chronic kidney disease, stage 4 (severe): Secondary | ICD-10-CM | POA: Insufficient documentation

## 2020-06-30 DIAGNOSIS — I2721 Secondary pulmonary arterial hypertension: Secondary | ICD-10-CM | POA: Insufficient documentation

## 2020-06-30 DIAGNOSIS — I272 Pulmonary hypertension, unspecified: Secondary | ICD-10-CM

## 2020-06-30 DIAGNOSIS — I5032 Chronic diastolic (congestive) heart failure: Secondary | ICD-10-CM | POA: Diagnosis not present

## 2020-06-30 DIAGNOSIS — I5033 Acute on chronic diastolic (congestive) heart failure: Secondary | ICD-10-CM | POA: Insufficient documentation

## 2020-06-30 DIAGNOSIS — I130 Hypertensive heart and chronic kidney disease with heart failure and stage 1 through stage 4 chronic kidney disease, or unspecified chronic kidney disease: Secondary | ICD-10-CM | POA: Diagnosis not present

## 2020-06-30 DIAGNOSIS — J984 Other disorders of lung: Secondary | ICD-10-CM | POA: Insufficient documentation

## 2020-06-30 DIAGNOSIS — M109 Gout, unspecified: Secondary | ICD-10-CM | POA: Diagnosis not present

## 2020-06-30 LAB — BASIC METABOLIC PANEL
Anion gap: 11 (ref 5–15)
BUN: 35 mg/dL — ABNORMAL HIGH (ref 6–20)
CO2: 22 mmol/L (ref 22–32)
Calcium: 9.2 mg/dL (ref 8.9–10.3)
Chloride: 105 mmol/L (ref 98–111)
Creatinine, Ser: 2.22 mg/dL — ABNORMAL HIGH (ref 0.44–1.00)
GFR calc Af Amer: 28 mL/min — ABNORMAL LOW (ref >60)
GFR calc non Af Amer: 25 mL/min — ABNORMAL LOW (ref >60)
Glucose, Bld: 104 mg/dL — ABNORMAL HIGH (ref 70–99)
Potassium: 3.9 mmol/L (ref 3.5–5.1)
Sodium: 138 mmol/L (ref 135–145)

## 2020-06-30 LAB — CBC
HCT: 44.6 % (ref 36.0–46.0)
Hemoglobin: 13.7 g/dL (ref 12.0–15.0)
MCH: 30.3 pg (ref 26.0–34.0)
MCHC: 30.7 g/dL (ref 30.0–36.0)
MCV: 98.7 fL (ref 80.0–100.0)
Platelets: 245 10*3/uL (ref 150–400)
RBC: 4.52 MIL/uL (ref 3.87–5.11)
RDW: 14.1 % (ref 11.5–15.5)
WBC: 6.2 10*3/uL (ref 4.0–10.5)
nRBC: 0 % (ref 0.0–0.2)

## 2020-06-30 LAB — BRAIN NATRIURETIC PEPTIDE: B Natriuretic Peptide: 53.5 pg/mL (ref 0.0–100.0)

## 2020-06-30 MED ORDER — TADALAFIL (PAH) 20 MG PO TABS: 40.0000 mg | ORAL_TABLET | Freq: Every day | ORAL | 11 refills | Status: DC

## 2020-06-30 MED ORDER — TORSEMIDE 20 MG PO TABS
40.0000 mg | ORAL_TABLET | Freq: Two times a day (BID) | ORAL | 3 refills | Status: DC
Start: 2020-06-30 — End: 2020-11-01

## 2020-06-30 NOTE — Telephone Encounter (Signed)
Spoke with [employer]. Ordered split night. Will start patient on 4L O2 with expectation to see desaturations related to her OSA.  Rodman Pickle, M.D. [facility] 06/30/2020 9:15 AM

## 2020-06-30 NOTE — Progress Notes (Signed)
6 Min Walk Test Completed  Pt ambulated 167.63meters O2 Sat ranged 90-93 on 8L oxygen HR ranged 98-120

## 2020-06-30 NOTE — Patient Instructions (Addendum)
Increase Torsemide to 60 mg (3 tabs) in AM and 40 mg (2 tabs) in PM FOR 2 DAYS ONLY, then take:   Torsemide 40 mg (2 tabs) Twice daily   Increase Tadalafil to 40 mg (2 tabs) Daily  Labs done today, your results will be available in MyChart, we will contact you for abnormal readings.  Heart Catheterization on Mon 9/13, see instructions below  Your physician recommends that you schedule a follow-up appointment in: 6-8 weeks  If you have any questions or concerns before your next appointment please send [place] a message through [place] or call our office at [phone].    TO LEAVE A MESSAGE FOR THE NURSE SELECT OPTION 2, PLEASE LEAVE A MESSAGE INCLUDING: . YOUR NAME . DATE OF BIRTH . CALL BACK NUMBER . REASON FOR CALL**this is important as we prioritize the call backs  [place] AS LONG AS YOU CALL BEFORE 4:00 PM  At the [facility], you and your health needs are our priority. As part of our continuing mission to provide you with exceptional heart care, we have created designated Provider Care Teams. These Care Teams include your primary Cardiologist (physician) and Advanced Practice Providers (APPs- Physician Assistants and Nurse Practitioners) who all work together to provide you with the care you need, when you need it.   You may see any of the following providers on your designated Care Team at your next follow up: Marland Kitchen Dr Glori Bickers . Dr [name] . Darrick Grinder, NP . Lyda Jester, PA . Audry Riles, PharmD   Please be sure to bring in all your medications bottles to every appointment.    CATHETERIZATION INSTRUCTIONS  You are scheduled for a Cardiac Catheterization on Monday, September 13 with Dr. [name].  1. Please arrive at the [facility] (Main Entrance A) at [facility]: [address] [place], [place] [address] at 8:30 AM (This time is two hours before your procedure to ensure your preparation). Free  valet parking service is available.   Special note: Every effort is made to have your procedure done on time. Please understand that emergencies sometimes delay scheduled procedures.  2. Diet: Do not eat solid foods after midnight.  The patient may have clear liquids until 5am upon the day of the procedure.  3. COVID TEST: Friday 07/09/20 AT 11:15 AM, this is a drive thru test, please go to:             [facility], [place]  4. Medication instructions in preparation for your procedure:  Monday 9/13 AM DO NOT TAKE: Eliquis, Torsemide, or Spironolactone   On the morning of your procedure, take any morning medicines NOT listed above.  You may use sips of water.  5. Plan for one night stay--bring personal belongings. 6. Bring a current list of your medications and current insurance cards. 7. You MUST have a responsible person to drive you home. 8. Someone MUST be with you the first 24 hours after you arrive home or your discharge will be delayed. 9. Please wear clothes that are easy to get on and off and wear slip-on shoes.  Thank you for allowing [place] to care for you!   -- [facility]

## 2020-07-01 ENCOUNTER — Ambulatory Visit (HOSPITAL_BASED_OUTPATIENT_CLINIC_OR_DEPARTMENT_OTHER): Payer: Medicare Other | Attending: Pulmonary Disease | Admitting: Internal Medicine

## 2020-07-01 ENCOUNTER — Telehealth (HOSPITAL_COMMUNITY): Payer: Self-pay

## 2020-07-01 ENCOUNTER — Encounter (HOSPITAL_BASED_OUTPATIENT_CLINIC_OR_DEPARTMENT_OTHER): Payer: Medicare Other | Admitting: Internal Medicine

## 2020-07-01 VITALS — Ht 61.0 in | Wt 263.0 lb

## 2020-07-01 DIAGNOSIS — I493 Ventricular premature depolarization: Secondary | ICD-10-CM | POA: Diagnosis not present

## 2020-07-01 DIAGNOSIS — R0902 Hypoxemia: Secondary | ICD-10-CM | POA: Diagnosis not present

## 2020-07-01 DIAGNOSIS — R0683 Snoring: Secondary | ICD-10-CM | POA: Insufficient documentation

## 2020-07-01 DIAGNOSIS — G4733 Obstructive sleep apnea (adult) (pediatric): Secondary | ICD-10-CM | POA: Insufficient documentation

## 2020-07-01 DIAGNOSIS — Z9989 Dependence on other enabling machines and devices: Secondary | ICD-10-CM | POA: Insufficient documentation

## 2020-07-01 NOTE — H&P (View-Only) (Signed)
ID:  Dawn Fowler, DOB Apr 09, 1966, MRN [id number]    Provider location: [facility] Type of Visit: Established patient   PCP:  Hayden Rasmussen, MD  Cardiologist:  Dr. Aundra Dubin   History of Present Illness: Dawn Fowler is a 54 y.o. 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  She is on CPAP at home and 6L home oxygen (says she has been on oxygen for "years.").  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  She additionally has significant HTN.  She had been on Opsumit 10 + tadalafil 20 mg daily but ran out of this prior to her 2/21 admission. She also has a history of prior DVT after long train ride, has had 2 negative V/Q scans.  She was hospitalized for COVID-19 at [facility] in 1/21.   She was seen by [facility] after discharge from [place] admission and was noted to be volume overloaded, Lasix was changed to torsemide but she was unable to get to pharmacy to pick up. Subsequently, she developed progressive wt gain, nearly 20 lb and increased dyspnea, prompting her to report back to the ED in 2/21, where she was found to be in acute on chronic diastolic HF and readmitted for IV diuresis and AHF consultation. Echo in 2/21 showed LVEF 55-60%, moderate RV dilation/moderate RV dysfunction. [place] 2/21 showed elevated left and right heart filling pressures with severe pulmonary hypertension. PFTs completed showing severe restriction and severely decreased DLCO. High resolution CT was concerning  for small vessel disease, no ILD, and concerning for PAH.  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 She was continued on IV diuretics and had good response, diuresing 32 lb. She was transitioned to  torsemide 40 mg/20 mg. Opsumit and tadalafil restarted.   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  She is on Eliquis.   She presents today for followup of CHF and RV failure.  Weight is up 14 lbs since last time she was in the office.  She continues on home oxygen and CPAP.  She uses a walker.  She is short of breath walking in her house, mildly worse recently.  No lightheadedness or syncope.  No orthopnea/PND.  No chest pain.   Labs (6/21): K 4.5, creatinine 2.09 Labs (8/21): K 4.3, creatinine 2.11  6 minute walk (9/21): 168 m  PMH: 1. OHS/OSA: Uses CPAP and home oxygen.  2. CKD stage IV 3. COVID-19 PNA in 12/20 4. Restrictive lung disease: PFTs (2/21) with severe restriction, severely decreased DLCO.  - High resolution CT chest (2/21): no interstitial lung disease, "small vessel disease."  5. Gout 6. HTN 7. DVT: Remote, after train ride.  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  8. Pulmonary hypertension: RHC (2/21) with mean RA 14, PA 89/33 mean 54, mean PCWP 34, CI 2.22, PVR 3.87 WU => mixed pulmonary arterial/pulmonary venous hypertension.  - V/Q scan (2/21): No evidence for chronic PE.  - Echo (2/21): EF 55-60%, moderately dilated RV with moderately decreased systolic function.  - HIV and rheumatologic serologies negative.    Current Outpatient Medications  Medication Sig Dispense Refill  . acetaminophen (TYLENOL) 500  MG tablet Take 1,000 mg by mouth every 6 (six) hours as needed for moderate pain or headache.    . albuterol (VENTOLIN HFA) 108 (90 Base) MCG/ACT inhaler Inhale 2 puffs into the lungs every 6 (six) hours as needed for wheezing or shortness of breath. 6.7 g 0  . allopurinol (ZYLOPRIM) 100 MG tablet Take 100 mg by mouth daily.    Marland Kitchen apixaban (ELIQUIS) 5 MG TABS tablet Take 1 tablet (5 mg total) by mouth 2 (two) times daily. 60 tablet 3  . Biotin w/ Vitamins C & E (HAIR SKIN & NAILS GUMMIES PO) Take 2 tablets by mouth daily.     . Cholecalciferol (VITAMIN D-3) 125 MCG (5000 UT) TABS Take 5,000 mg by mouth daily.    . colchicine 0.6 MG tablet Take 0.6 mg by mouth daily as needed (gout flare).    . fluticasone furoate-vilanterol (BREO ELLIPTA) 200-25 MCG/INH AEPB Inhale 1 puff into the lungs daily. 60 each 5  . gabapentin (NEURONTIN) 100 MG capsule Take 300 mg by mouth at bedtime.     . hydrALAZINE (APRESOLINE) 25 MG tablet Take 1 tablet (25 mg total) by mouth 3 (three) times daily. 270 tablet 3  . ipratropium-albuterol (DUONEB) 0.5-2.5 (3) MG/3ML SOLN Take 3 mLs by nebulization every 6 (six) hours as needed. (Patient taking differently: Take 3 mLs by nebulization every 6 (six) hours as needed (shortness of breath). ) 360 mL 5  . macitentan (OPSUMIT) 10 MG tablet Take 1 tablet (10 mg total) by mouth daily. 30 tablet 11  . Multiple Vitamins-Minerals (ADULT GUMMY) CHEW Chew 2 tablets by mouth daily.    Marland Kitchen NIFEdipine (ADALAT CC) 60 MG 24 hr tablet Take 60 mg by mouth 2 (two) times daily.    . OXYGEN Inhale 4-8 L into the lungs continuous.    . pantoprazole (PROTONIX) 40 MG tablet Take 40 mg by mouth daily.    . potassium chloride SA (KLOR-CON) 20 MEQ tablet Take 40 mEq by mouth 2 (two) times daily.    Marland Kitchen PREMARIN 0.625 MG tablet Take 0.625 mg by mouth daily.    Marland Kitchen spironolactone (ALDACTONE) 25 MG tablet Take 0.5 tablets (12.5 mg total) by mouth daily. 45 tablet 1  . tadalafil, PAH, (ALYQ) 20 MG tablet Take 2 tablets (40 mg total) by mouth daily. 60 tablet 11  . telmisartan (MICARDIS) 40 MG tablet     . torsemide (DEMADEX) 20 MG tablet Take 2 tablets (40 mg total) by mouth 2 (two) times daily. 120 tablet 3  . traMADol (ULTRAM) 50 MG tablet Take 50 mg by mouth every 6 (six) hours as needed for pain.    . TURMERIC-GINGER PO Take 2 tablets by mouth daily.     No current facility-administered medications for this encounter.    Allergies  Allergen Reactions  . Adhesive [Tape] Hives    Tolerates paper tape  . Succinylcholine  Other (See Comments)    Have trouble waking up  . Tizanidine Hives      Social History   Socioeconomic History  . Marital status: Single    Spouse name: Not on file  . Number of children: Not on file  . Years of education: Not on file  . Highest education level: Not on file  Occupational History  . Not on file  Tobacco Use  . Smoking status: Former Research scientist (life sciences)  . Smokeless tobacco: Never Used  . Tobacco comment: only smoked for fun in highschool not even one a day  Vaping  Use  . Vaping Use: Never used  Substance and Sexual Activity  . Alcohol use: Yes    Comment: occ  . Drug use: Not Currently  . Sexual activity: Yes    Partners: Male    Comment: 1st intercourse- 36, partners- 72, current partner- 10 yrs   Other Topics Concern  . Not on file  Social History Narrative  . Not on file   Social Determinants of Health   Financial Resource Strain:   . Difficulty of Paying Living Expenses: Not on file  Food Insecurity:   . Worried About Charity fundraiser in the Last Year: Not on file  . Ran Out of Food in the Last Year: Not on file  Transportation Needs:   . Lack of Transportation (Medical): Not on file  . Lack of Transportation (Non-Medical): Not on file  Physical Activity:   . Days of Exercise per Week: Not on file  . Minutes of Exercise per Session: Not on file  Stress:   . Feeling of Stress : Not on file  Social Connections:   . Frequency of Communication with Friends and Family: Not on file  . Frequency of Social Gatherings with Friends and Family: Not on file  . Attends Religious Services: Not on file  . Active Member of Clubs or Organizations: Not on file  . Attends Archivist Meetings: Not on file  . Marital Status: Not on file  Intimate Partner Violence:   . Fear of Current or Ex-Partner: Not on file  . Emotionally Abused: Not on file  . Physically Abused: Not on file  . Sexually Abused: Not on file      Family History  Problem Relation Age of  Onset  . COPD Father   . Cancer Mother        gallbladder and bladder   . Hypertension Brother   . Breast cancer Other   . Diabetes Son   . Breast cancer Cousin   . Breast cancer Cousin   . Pulmonary Hypertension Neg Hx     Vitals:   06/30/20 1532  BP: 118/84  Pulse: 85  SpO2: 96%  Weight: 118.8 kg (262 lb)  Height: 5\' 1"  (1.549 m)   Wt Readings from Last 3 Encounters:  07/01/20 119.3 kg (263 lb)  06/30/20 118.8 kg (262 lb)  05/11/20 112.5 kg (248 lb)   Exam:   BP 118/84   Pulse 85   Ht 5\' 1"  (1.549 m)   Wt 118.8 kg (262 lb)   LMP  (LMP Unknown) Comment: pmb last 2-3 months  SpO2 96%   BMI 49.50 kg/m  General: NAD, obese.  Neck: Thick. No JVD, no thyromegaly or thyroid nodule.  Lungs: Distant BS CV: Nondisplaced PMI.  Heart regular S1/S2, no S3/S4, no murmur.  1+ edema 3/4 to knee on right.  No carotid bruit.  Normal pedal pulses.  Abdomen: Soft, nontender, no hepatosplenomegaly, no distention.  Skin: Intact without lesions or rashes.  Neurologic: Alert and oriented x 3.  Psych: Normal affect. Extremities: No clubbing or cyanosis.  HEENT: Normal.    ASSESSMENT & PLAN:  1. Chronic diastolic CHF with significant RV dysfunction: Echo in 2/21 with LVEF 55-60%, moderate RV dilation/moderate RV dysfunction.  [place] 2/18 showed elevated left and right heart filling pressures with severe pulmonary hypertension.  Suspect mixed pulmonary venous/pulmonary arterial hypertension in setting of significant LV diastolic dysfunction (PVR 3.87 WU). NYHA class III symptoms, somewhat worse recently.  Weight is up, but  think that lung disease plays a major role in her dyspnea.   - Increase torsemide to 60 qam/40 qpm x 2 days then 40 mg bid.  BMET/BNP today and again in 10 days.  2. Pulmonary hypertension: suspect that this is a mixed picture with group 2 and group 3 PH (related to diastolic LV dysfunction and OHS/OSA).  Cannot rule out group 1 PH. V/Q scan not suggestive of chronic PEs  and high resolution CT chest in 2/21 was not suggestive of ILD. HIV and rheumatologic serologies negative. 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 PFTs completed in 2/21 showed severe restriction, severely decreased DLCO (?restriction due to body habitus). She is on Opsumit and tadalafil given concern for group 1 PH component.  - Suspect that the key to treatment will be maintaining oxygen saturation with home O2 and CPAP. Weight loss also would be helpful.  - Continue Opsumit 10 mg daily. - Increase tadalafil to 40 mg daily.  - I am going to repeat RHC with more aggressive diuresis and on PH meds.  We discussed risks/benefits and she agrees to procedure.  Can continue Eliquis.  - Sleep study tomorrow.  - 6 minute walk today.    3. HTN: BP controlled.  - Continue nifedipine CR 60 mg bid.  - Continue hydralazine 25 mg tid.  - Continue telmisartan 40 daily.  4. CKD stage 3b-4: baseline SCr ~2 - BMET today.  - Followup with nephrology.  5. OHS/OSA: compliant w/ CPAP and home oxygen.  - Continue CPAP  - Needs weight loss. [facility] will [facility]. She asks about breast reduction => I think this could potentially help lung restriction but need to make sure she is as optimized from a cardiopulmonary standpoint as possible before considering surgery.  6. 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  As she is at baseline sedentary and has had recurrent DVTs, would continue Eliquis long-term.    Followup in 6 wks.   Signed, [name], MD  07/01/2020  [facility] [address] [place] [address] [facility] and [place] [address] [phone] (office) [phone] (fax)

## 2020-07-01 NOTE — Telephone Encounter (Signed)
No pre cert req for RT Heart Cath CPT Code 39795 per [facility] online portal.

## 2020-07-01 NOTE — Progress Notes (Signed)
ID:  Dawn Fowler, DOB 08-May-1966, MRN [id number]    Provider location: [facility] Type of Visit: Established patient   PCP:  Hayden Rasmussen, MD  Cardiologist:  Dr. Aundra Dubin   History of Present Illness: Dawn Fowler is a 54 y.o. 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  She is on CPAP at home and 6L home oxygen (says she has been on oxygen for "years.").  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  She additionally has significant HTN.  She had been on Opsumit 10 + tadalafil 20 mg daily but ran out of this prior to her 2/21 admission. She also has a history of prior DVT after long train ride, has had 2 negative V/Q scans.  She was hospitalized for COVID-19 at [facility] in 1/21.   She was seen by [facility] after discharge from [place] admission and was noted to be volume overloaded, Lasix was changed to torsemide but she was unable to get to pharmacy to pick up. Subsequently, she developed progressive wt gain, nearly 20 lb and increased dyspnea, prompting her to report back to the ED in 2/21, where she was found to be in acute on chronic diastolic HF and readmitted for IV diuresis and AHF consultation. Echo in 2/21 showed LVEF 55-60%, moderate RV dilation/moderate RV dysfunction. [place] 2/21 showed elevated left and right heart filling pressures with severe pulmonary hypertension. PFTs completed showing severe restriction and severely decreased DLCO. High resolution CT was concerning  for small vessel disease, no ILD, and concerning for PAH.  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 She was continued on IV diuretics and had good response, diuresing 32 lb. She was transitioned to  torsemide 40 mg/20 mg. Opsumit and tadalafil restarted.   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  She is on Eliquis.   She presents today for followup of CHF and RV failure.  Weight is up 14 lbs since last time she was in the office.  She continues on home oxygen and CPAP.  She uses a walker.  She is short of breath walking in her house, mildly worse recently.  No lightheadedness or syncope.  No orthopnea/PND.  No chest pain.   Labs (6/21): K 4.5, creatinine 2.09 Labs (8/21): K 4.3, creatinine 2.11  6 minute walk (9/21): 168 m  PMH: 1. OHS/OSA: Uses CPAP and home oxygen.  2. CKD stage IV 3. COVID-19 PNA in 12/20 4. Restrictive lung disease: PFTs (2/21) with severe restriction, severely decreased DLCO.  - High resolution CT chest (2/21): no interstitial lung disease, "small vessel disease."  5. Gout 6. HTN 7. DVT: Remote, after train ride.  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  8. Pulmonary hypertension: RHC (2/21) with mean RA 14, PA 89/33 mean 54, mean PCWP 34, CI 2.22, PVR 3.87 WU => mixed pulmonary arterial/pulmonary venous hypertension.  - V/Q scan (2/21): No evidence for chronic PE.  - Echo (2/21): EF 55-60%, moderately dilated RV with moderately decreased systolic function.  - HIV and rheumatologic serologies negative.    Current Outpatient Medications  Medication Sig Dispense Refill  . acetaminophen (TYLENOL) 500  MG tablet Take 1,000 mg by mouth every 6 (six) hours as needed for moderate pain or headache.    . albuterol (VENTOLIN HFA) 108 (90 Base) MCG/ACT inhaler Inhale 2 puffs into the lungs every 6 (six) hours as needed for wheezing or shortness of breath. 6.7 g 0  . allopurinol (ZYLOPRIM) 100 MG tablet Take 100 mg by mouth daily.    Marland Kitchen apixaban (ELIQUIS) 5 MG TABS tablet Take 1 tablet (5 mg total) by mouth 2 (two) times daily. 60 tablet 3  . Biotin w/ Vitamins C & E (HAIR SKIN & NAILS GUMMIES PO) Take 2 tablets by mouth daily.     . Cholecalciferol (VITAMIN D-3) 125 MCG (5000 UT) TABS Take 5,000 mg by mouth daily.    . colchicine 0.6 MG tablet Take 0.6 mg by mouth daily as needed (gout flare).    . fluticasone furoate-vilanterol (BREO ELLIPTA) 200-25 MCG/INH AEPB Inhale 1 puff into the lungs daily. 60 each 5  . gabapentin (NEURONTIN) 100 MG capsule Take 300 mg by mouth at bedtime.     . hydrALAZINE (APRESOLINE) 25 MG tablet Take 1 tablet (25 mg total) by mouth 3 (three) times daily. 270 tablet 3  . ipratropium-albuterol (DUONEB) 0.5-2.5 (3) MG/3ML SOLN Take 3 mLs by nebulization every 6 (six) hours as needed. (Patient taking differently: Take 3 mLs by nebulization every 6 (six) hours as needed (shortness of breath). ) 360 mL 5  . macitentan (OPSUMIT) 10 MG tablet Take 1 tablet (10 mg total) by mouth daily. 30 tablet 11  . Multiple Vitamins-Minerals (ADULT GUMMY) CHEW Chew 2 tablets by mouth daily.    Marland Kitchen NIFEdipine (ADALAT CC) 60 MG 24 hr tablet Take 60 mg by mouth 2 (two) times daily.    . OXYGEN Inhale 4-8 L into the lungs continuous.    . pantoprazole (PROTONIX) 40 MG tablet Take 40 mg by mouth daily.    . potassium chloride SA (KLOR-CON) 20 MEQ tablet Take 40 mEq by mouth 2 (two) times daily.    Marland Kitchen PREMARIN 0.625 MG tablet Take 0.625 mg by mouth daily.    Marland Kitchen spironolactone (ALDACTONE) 25 MG tablet Take 0.5 tablets (12.5 mg total) by mouth daily. 45 tablet 1  . tadalafil, PAH, (ALYQ) 20 MG tablet Take 2 tablets (40 mg total) by mouth daily. 60 tablet 11  . telmisartan (MICARDIS) 40 MG tablet     . torsemide (DEMADEX) 20 MG tablet Take 2 tablets (40 mg total) by mouth 2 (two) times daily. 120 tablet 3  . traMADol (ULTRAM) 50 MG tablet Take 50 mg by mouth every 6 (six) hours as needed for pain.    . TURMERIC-GINGER PO Take 2 tablets by mouth daily.     No current facility-administered medications for this encounter.    Allergies  Allergen Reactions  . Adhesive [Tape] Hives    Tolerates paper tape  . Succinylcholine  Other (See Comments)    Have trouble waking up  . Tizanidine Hives      Social History   Socioeconomic History  . Marital status: Single    Spouse name: Not on file  . Number of children: Not on file  . Years of education: Not on file  . Highest education level: Not on file  Occupational History  . Not on file  Tobacco Use  . Smoking status: Former Research scientist (life sciences)  . Smokeless tobacco: Never Used  . Tobacco comment: only smoked for fun in highschool not even one a day  Vaping  Use  . Vaping Use: Never used  Substance and Sexual Activity  . Alcohol use: Yes    Comment: occ  . Drug use: Not Currently  . Sexual activity: Yes    Partners: Male    Comment: 1st intercourse- 32, partners- 88, current partner- 10 yrs   Other Topics Concern  . Not on file  Social History Narrative  . Not on file   Social Determinants of Health   Financial Resource Strain:   . Difficulty of Paying Living Expenses: Not on file  Food Insecurity:   . Worried About Charity fundraiser in the Last Year: Not on file  . Ran Out of Food in the Last Year: Not on file  Transportation Needs:   . Lack of Transportation (Medical): Not on file  . Lack of Transportation (Non-Medical): Not on file  Physical Activity:   . Days of Exercise per Week: Not on file  . Minutes of Exercise per Session: Not on file  Stress:   . Feeling of Stress : Not on file  Social Connections:   . Frequency of Communication with Friends and Family: Not on file  . Frequency of Social Gatherings with Friends and Family: Not on file  . Attends Religious Services: Not on file  . Active Member of Clubs or Organizations: Not on file  . Attends Archivist Meetings: Not on file  . Marital Status: Not on file  Intimate Partner Violence:   . Fear of Current or Ex-Partner: Not on file  . Emotionally Abused: Not on file  . Physically Abused: Not on file  . Sexually Abused: Not on file      Family History  Problem Relation Age of  Onset  . COPD Father   . Cancer Mother        gallbladder and bladder   . Hypertension Brother   . Breast cancer Other   . Diabetes Son   . Breast cancer Cousin   . Breast cancer Cousin   . Pulmonary Hypertension Neg Hx     Vitals:   06/30/20 1532  BP: 118/84  Pulse: 85  SpO2: 96%  Weight: 118.8 kg (262 lb)  Height: 5\' 1"  (1.549 m)   Wt Readings from Last 3 Encounters:  07/01/20 119.3 kg (263 lb)  06/30/20 118.8 kg (262 lb)  05/11/20 112.5 kg (248 lb)   Exam:   BP 118/84   Pulse 85   Ht 5\' 1"  (1.549 m)   Wt 118.8 kg (262 lb)   LMP  (LMP Unknown) Comment: pmb last 2-3 months  SpO2 96%   BMI 49.50 kg/m  General: NAD, obese.  Neck: Thick. No JVD, no thyromegaly or thyroid nodule.  Lungs: Distant BS CV: Nondisplaced PMI.  Heart regular S1/S2, no S3/S4, no murmur.  1+ edema 3/4 to knee on right.  No carotid bruit.  Normal pedal pulses.  Abdomen: Soft, nontender, no hepatosplenomegaly, no distention.  Skin: Intact without lesions or rashes.  Neurologic: Alert and oriented x 3.  Psych: Normal affect. Extremities: No clubbing or cyanosis.  HEENT: Normal.    ASSESSMENT & PLAN:  1. Chronic diastolic CHF with significant RV dysfunction: Echo in 2/21 with LVEF 55-60%, moderate RV dilation/moderate RV dysfunction.  [place] 2/18 showed elevated left and right heart filling pressures with severe pulmonary hypertension.  Suspect mixed pulmonary venous/pulmonary arterial hypertension in setting of significant LV diastolic dysfunction (PVR 3.87 WU). NYHA class III symptoms, somewhat worse recently.  Weight is up, but  think that lung disease plays a major role in her dyspnea.   - Increase torsemide to 60 qam/40 qpm x 2 days then 40 mg bid.  BMET/BNP today and again in 10 days.  2. Pulmonary hypertension: suspect that this is a mixed picture with group 2 and group 3 PH (related to diastolic LV dysfunction and OHS/OSA).  Cannot rule out group 1 PH. V/Q scan not suggestive of chronic PEs  and high resolution CT chest in 2/21 was not suggestive of ILD. HIV and rheumatologic serologies negative. 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 PFTs completed in 2/21 showed severe restriction, severely decreased DLCO (?restriction due to body habitus). She is on Opsumit and tadalafil given concern for group 1 PH component.  - Suspect that the key to treatment will be maintaining oxygen saturation with home O2 and CPAP. Weight loss also would be helpful.  - Continue Opsumit 10 mg daily. - Increase tadalafil to 40 mg daily.  - I am going to repeat RHC with more aggressive diuresis and on PH meds.  We discussed risks/benefits and she agrees to procedure.  Can continue Eliquis.  - Sleep study tomorrow.  - 6 minute walk today.    3. HTN: BP controlled.  - Continue nifedipine CR 60 mg bid.  - Continue hydralazine 25 mg tid.  - Continue telmisartan 40 daily.  4. CKD stage 3b-4: baseline SCr ~2 - BMET today.  - Followup with nephrology.  5. OHS/OSA: compliant w/ CPAP and home oxygen.  - Continue CPAP  - Needs weight loss. I will [facility]. She asks about breast reduction => I think this could potentially help lung restriction but need to make sure she is as optimized from a cardiopulmonary standpoint as possible before considering surgery.  6. 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  As she is at baseline sedentary and has had recurrent DVTs, would continue Eliquis long-term.    Followup in 6 wks.   Signed, [name], MD  07/01/2020  [facility] [address] [facility] and [place] [address] [phone] (office) [phone] (fax)

## 2020-07-09 ENCOUNTER — Other Ambulatory Visit (HOSPITAL_COMMUNITY)
Admission: RE | Admit: 2020-07-09 | Discharge: 2020-07-09 | Disposition: A | Discharge status: Home / Self Care | Payer: Medicare Other | Source: Ambulatory Visit | Attending: Cardiology | Admitting: Cardiology

## 2020-07-09 DIAGNOSIS — Z20822 Contact with and (suspected) exposure to covid-19: Secondary | ICD-10-CM | POA: Diagnosis not present

## 2020-07-09 DIAGNOSIS — Z01812 Encounter for preprocedural laboratory examination: Secondary | ICD-10-CM | POA: Insufficient documentation

## 2020-07-09 LAB — SARS CORONAVIRUS 2 (TAT 6-24 HRS): SARS Coronavirus 2: NEGATIVE

## 2020-07-12 ENCOUNTER — Ambulatory Visit (HOSPITAL_COMMUNITY)
Admission: RE | Admit: 2020-07-12 | Discharge: 2020-07-12 | Disposition: A | Discharge status: Home / Self Care | Payer: Medicare Other | Attending: Cardiology | Admitting: Cardiology

## 2020-07-12 ENCOUNTER — Encounter (HOSPITAL_COMMUNITY)
Admission: RE | Disposition: A | Discharge status: Home / Self Care | Payer: Self-pay | Source: Home / Self Care | Attending: Cardiology

## 2020-07-12 ENCOUNTER — Telehealth (HOSPITAL_COMMUNITY): Payer: Self-pay | Admitting: Pharmacist

## 2020-07-12 DIAGNOSIS — I130 Hypertensive heart and chronic kidney disease with heart failure and stage 1 through stage 4 chronic kidney disease, or unspecified chronic kidney disease: Secondary | ICD-10-CM | POA: Diagnosis not present

## 2020-07-12 DIAGNOSIS — Z888 Allergy status to other drugs, medicaments and biological substances status: Secondary | ICD-10-CM | POA: Insufficient documentation

## 2020-07-12 DIAGNOSIS — N1832 Chronic kidney disease, stage 3b: Secondary | ICD-10-CM | POA: Insufficient documentation

## 2020-07-12 DIAGNOSIS — G4733 Obstructive sleep apnea (adult) (pediatric): Secondary | ICD-10-CM | POA: Insufficient documentation

## 2020-07-12 DIAGNOSIS — I5032 Chronic diastolic (congestive) heart failure: Secondary | ICD-10-CM | POA: Insufficient documentation

## 2020-07-12 DIAGNOSIS — Z86718 Personal history of other venous thrombosis and embolism: Secondary | ICD-10-CM | POA: Diagnosis not present

## 2020-07-12 DIAGNOSIS — Z7901 Long term (current) use of anticoagulants: Secondary | ICD-10-CM | POA: Insufficient documentation

## 2020-07-12 DIAGNOSIS — I272 Pulmonary hypertension, unspecified: Secondary | ICD-10-CM

## 2020-07-12 DIAGNOSIS — Z7951 Long term (current) use of inhaled steroids: Secondary | ICD-10-CM | POA: Insufficient documentation

## 2020-07-12 DIAGNOSIS — Z79899 Other long term (current) drug therapy: Secondary | ICD-10-CM | POA: Insufficient documentation

## 2020-07-12 DIAGNOSIS — Z8616 Personal history of COVID-19: Secondary | ICD-10-CM | POA: Diagnosis not present

## 2020-07-12 DIAGNOSIS — M109 Gout, unspecified: Secondary | ICD-10-CM | POA: Insufficient documentation

## 2020-07-12 DIAGNOSIS — Z87891 Personal history of nicotine dependence: Secondary | ICD-10-CM | POA: Insufficient documentation

## 2020-07-12 HISTORY — PX: RIGHT HEART CATH: CATH118263

## 2020-07-12 LAB — POCT I-STAT EG7
Acid-Base Excess: 1 mmol/L (ref 0.0–2.0)
Acid-Base Excess: 2 mmol/L (ref 0.0–2.0)
Acid-Base Excess: 0 mmol/L (ref 0.0–2.0)
Acid-Base Excess: 0 mmol/L (ref 0.0–2.0)
Acid-Base Excess: 0 mmol/L (ref 0.0–2.0)
Acid-Base Excess: 0 mmol/L (ref 0.0–2.0)
Acid-Base Excess: 1 mmol/L (ref 0.0–2.0)
Acid-base deficit: 1 mmol/L (ref 0.0–2.0)
Bicarbonate: 25.1 mmol/L (ref 20.0–28.0)
Bicarbonate: 26.4 mmol/L (ref 20.0–28.0)
Bicarbonate: 28.2 mmol/L — ABNORMAL HIGH (ref 20.0–28.0)
Bicarbonate: 25.6 mmol/L (ref 20.0–28.0)
Bicarbonate: 25.6 mmol/L (ref 20.0–28.0)
Bicarbonate: 26.4 mmol/L (ref 20.0–28.0)
Bicarbonate: 26.7 mmol/L (ref 20.0–28.0)
Bicarbonate: 26.9 mmol/L (ref 20.0–28.0)
Calcium, Ion: 1.21 mmol/L (ref 1.15–1.40)
Calcium, Ion: 1.26 mmol/L (ref 1.15–1.40)
Calcium, Ion: 1.28 mmol/L (ref 1.15–1.40)
Calcium, Ion: 1.24 mmol/L (ref 1.15–1.40)
Calcium, Ion: 1.26 mmol/L (ref 1.15–1.40)
Calcium, Ion: 1.27 mmol/L (ref 1.15–1.40)
Calcium, Ion: 1.27 mmol/L (ref 1.15–1.40)
Calcium, Ion: 1.28 mmol/L (ref 1.15–1.40)
HCT: 40 % (ref 36.0–46.0)
HCT: 41 % (ref 36.0–46.0)
HCT: 41 % (ref 36.0–46.0)
HCT: 40 % (ref 36.0–46.0)
HCT: 41 % (ref 36.0–46.0)
HCT: 41 % (ref 36.0–46.0)
HCT: 41 % (ref 36.0–46.0)
HCT: 41 % (ref 36.0–46.0)
Hemoglobin: 13.6 g/dL (ref 12.0–15.0)
Hemoglobin: 13.9 g/dL (ref 12.0–15.0)
Hemoglobin: 13.9 g/dL (ref 12.0–15.0)
Hemoglobin: 13.6 g/dL (ref 12.0–15.0)
Hemoglobin: 13.9 g/dL (ref 12.0–15.0)
Hemoglobin: 13.9 g/dL (ref 12.0–15.0)
Hemoglobin: 13.9 g/dL (ref 12.0–15.0)
Hemoglobin: 13.9 g/dL (ref 12.0–15.0)
O2 Saturation: 66 %
O2 Saturation: 70 %
O2 Saturation: 75 %
O2 Saturation: 61 %
O2 Saturation: 65 %
O2 Saturation: 65 %
O2 Saturation: 66 %
O2 Saturation: 72 %
Patient temperature: 11
Potassium: 3.6 mmol/L (ref 3.5–5.1)
Potassium: 3.7 mmol/L (ref 3.5–5.1)
Potassium: 3.8 mmol/L (ref 3.5–5.1)
Potassium: 3.7 mmol/L (ref 3.5–5.1)
Potassium: 3.8 mmol/L (ref 3.5–5.1)
Potassium: 3.8 mmol/L (ref 3.5–5.1)
Potassium: 3.8 mmol/L (ref 3.5–5.1)
Potassium: 3.9 mmol/L (ref 3.5–5.1)
Sodium: 141 mmol/L (ref 135–145)
Sodium: 141 mmol/L (ref 135–145)
Sodium: 143 mmol/L (ref 135–145)
Sodium: 142 mmol/L (ref 135–145)
Sodium: 142 mmol/L (ref 135–145)
Sodium: 142 mmol/L (ref 135–145)
Sodium: 142 mmol/L (ref 135–145)
Sodium: 142 mmol/L (ref 135–145)
TCO2: 26 mmol/L (ref 22–32)
TCO2: 28 mmol/L (ref 22–32)
TCO2: 30 mmol/L (ref 22–32)
TCO2: 27 mmol/L (ref 22–32)
TCO2: 27 mmol/L (ref 22–32)
TCO2: 28 mmol/L (ref 22–32)
TCO2: 28 mmol/L (ref 22–32)
TCO2: 28 mmol/L (ref 22–32)
pCO2, Ven: 44 mmHg (ref 44.0–60.0)
pCO2, Ven: 45.4 mmHg (ref 44.0–60.0)
pCO2, Ven: 50.6 mmHg (ref 44.0–60.0)
pCO2, Ven: 15.5 mmHg — CL (ref 44.0–60.0)
pCO2, Ven: 43.7 mmHg — ABNORMAL LOW (ref 44.0–60.0)
pCO2, Ven: 44.5 mmHg (ref 44.0–60.0)
pCO2, Ven: 47.2 mmHg (ref 44.0–60.0)
pCO2, Ven: 47.9 mmHg (ref 44.0–60.0)
pH, Ven: 7.354 (ref 7.250–7.430)
pH, Ven: 7.364 (ref 7.250–7.430)
pH, Ven: 7.373 (ref 7.250–7.430)
pH, Ven: 7.35 (ref 7.250–7.430)
pH, Ven: 7.364 (ref 7.250–7.430)
pH, Ven: 7.369 (ref 7.250–7.430)
pH, Ven: 7.376 (ref 7.250–7.430)
pH, Ven: 7.726 (ref 7.250–7.430)
pO2, Ven: 35 mmHg (ref 32.0–45.0)
pO2, Ven: 39 mmHg (ref 32.0–45.0)
pO2, Ven: 42 mmHg (ref 32.0–45.0)
pO2, Ven: 35 mmHg (ref 32.0–45.0)
pO2, Ven: 35 mmHg (ref 32.0–45.0)
pO2, Ven: 35 mmHg (ref 32.0–45.0)
pO2, Ven: 40 mmHg (ref 32.0–45.0)
pO2, Ven: <15 mmHg — CL (ref 32.0–45.0)

## 2020-07-12 LAB — BASIC METABOLIC PANEL
Anion gap: 10 (ref 5–15)
BUN: 29 mg/dL — ABNORMAL HIGH (ref 6–20)
CO2: 24 mmol/L (ref 22–32)
Calcium: 9 mg/dL (ref 8.9–10.3)
Chloride: 102 mmol/L (ref 98–111)
Creatinine, Ser: 2.04 mg/dL — ABNORMAL HIGH (ref 0.44–1.00)
GFR calc Af Amer: 31 mL/min — ABNORMAL LOW (ref >60)
GFR calc non Af Amer: 27 mL/min — ABNORMAL LOW (ref >60)
Glucose, Bld: 103 mg/dL — ABNORMAL HIGH (ref 70–99)
Potassium: 5.9 mmol/L — ABNORMAL HIGH (ref 3.5–5.1)
Sodium: 136 mmol/L (ref 135–145)

## 2020-07-12 LAB — POCT I-STAT, CHEM 8
BUN: 30 mg/dL — ABNORMAL HIGH (ref 6–20)
Calcium, Ion: 1.32 mmol/L (ref 1.15–1.40)
Chloride: 105 mmol/L (ref 98–111)
Creatinine, Ser: 2 mg/dL — ABNORMAL HIGH (ref 0.44–1.00)
Glucose, Bld: 102 mg/dL — ABNORMAL HIGH (ref 70–99)
HCT: 46 % (ref 36.0–46.0)
Hemoglobin: 15.6 g/dL — ABNORMAL HIGH (ref 12.0–15.0)
Potassium: 3.9 mmol/L (ref 3.5–5.1)
Sodium: 141 mmol/L (ref 135–145)
TCO2: 24 mmol/L (ref 22–32)

## 2020-07-12 SURGERY — RIGHT HEART CATH
Anesthesia: LOCAL

## 2020-07-12 MED ORDER — SODIUM CHLORIDE 0.9% FLUSH: 3.0000 mL | Freq: Two times a day (BID) | INTRAVENOUS | Status: DC

## 2020-07-12 MED ORDER — FENTANYL CITRATE (PF) 100 MCG/2ML IJ SOLN
INTRAMUSCULAR | Status: DC | PRN
Start: 2020-07-12 — End: 2020-07-12
  Administered 2020-07-12: 25 ug via INTRAVENOUS

## 2020-07-12 MED ORDER — LABETALOL HCL 5 MG/ML IV SOLN: 10.0000 mg | INTRAVENOUS | Status: DC | PRN

## 2020-07-12 MED ORDER — OXYCODONE HCL 5 MG/5ML PO SOLN: 5.0000 mg | Freq: Once | ORAL | Status: DC | PRN

## 2020-07-12 MED ORDER — SODIUM CHLORIDE 0.9 % IV SOLN: 250.0000 mL | INTRAVENOUS | Status: DC | PRN

## 2020-07-12 MED ORDER — FENTANYL CITRATE (PF) 100 MCG/2ML IJ SOLN
INTRAMUSCULAR | Status: AC
  Administered 2020-07-12: 25 ug via INTRAVENOUS
  Filled 2020-07-12: qty 2

## 2020-07-12 MED ORDER — FENTANYL CITRATE (PF) 100 MCG/2ML IJ SOLN
INTRAMUSCULAR | Status: AC
  Filled 2020-07-12: qty 2

## 2020-07-12 MED ORDER — SODIUM CHLORIDE 0.9% FLUSH: 3.0000 mL | INTRAVENOUS | Status: DC | PRN

## 2020-07-12 MED ORDER — HEPARIN (PORCINE) IN NACL 1000-0.9 UT/500ML-% IV SOLN
INTRAVENOUS | Status: DC | PRN
  Administered 2020-07-12: 500 mL

## 2020-07-12 MED ORDER — FENTANYL CITRATE (PF) 100 MCG/2ML IJ SOLN: 25.0000 ug | Freq: Once | INTRAMUSCULAR | Status: AC

## 2020-07-12 MED ORDER — LIDOCAINE HCL (PF) 1 % IJ SOLN
INTRAMUSCULAR | Status: AC
  Filled 2020-07-12: qty 30

## 2020-07-12 MED ORDER — HYDRALAZINE HCL 20 MG/ML IJ SOLN: 10.0000 mg | INTRAMUSCULAR | Status: DC | PRN

## 2020-07-12 MED ORDER — MIDAZOLAM HCL 2 MG/2ML IJ SOLN
INTRAMUSCULAR | Status: DC | PRN
  Administered 2020-07-12: 1 mg via INTRAVENOUS

## 2020-07-12 MED ORDER — HEPARIN (PORCINE) IN NACL 1000-0.9 UT/500ML-% IV SOLN
INTRAVENOUS | Status: AC
  Filled 2020-07-12: qty 1000

## 2020-07-12 MED ORDER — ACETAMINOPHEN 325 MG PO TABS: 650.0000 mg | ORAL_TABLET | ORAL | Status: DC | PRN

## 2020-07-12 MED ORDER — FENTANYL CITRATE (PF) 100 MCG/2ML IJ SOLN: 25.0000 ug | INTRAMUSCULAR | Status: DC | PRN

## 2020-07-12 MED ORDER — OXYCODONE HCL 5 MG PO TABS: 5.0000 mg | ORAL_TABLET | Freq: Once | ORAL | Status: DC | PRN

## 2020-07-12 MED ORDER — SODIUM CHLORIDE 0.9 % IV SOLN: INTRAVENOUS | Status: DC

## 2020-07-12 MED ORDER — ONDANSETRON HCL 4 MG/2ML IJ SOLN: 4.0000 mg | Freq: Once | INTRAMUSCULAR | Status: DC | PRN

## 2020-07-12 MED ORDER — ONDANSETRON HCL 4 MG/2ML IJ SOLN: 4.0000 mg | Freq: Four times a day (QID) | INTRAMUSCULAR | Status: DC | PRN

## 2020-07-12 MED ORDER — ASPIRIN 81 MG PO CHEW: 81.0000 mg | CHEWABLE_TABLET | ORAL | Status: DC

## 2020-07-12 MED ORDER — MIDAZOLAM HCL 2 MG/2ML IJ SOLN
INTRAMUSCULAR | Status: AC
  Filled 2020-07-12: qty 2

## 2020-07-12 MED ORDER — LIDOCAINE HCL (PF) 1 % IJ SOLN
INTRAMUSCULAR | Status: DC | PRN
  Administered 2020-07-12: 2 mL

## 2020-07-12 SURGICAL SUPPLY — 7 items
CATH BALLN WEDGE 5F 110CM (CATHETERS) ×2 IMPLANT
GUIDEWIRE .025 260CM (WIRE) ×2 IMPLANT
KIT HEART LEFT (KITS) ×2 IMPLANT
MAT PREVALON FULL STRYKER (MISCELLANEOUS) ×2 IMPLANT
PACK CARDIAC CATHETERIZATION (CUSTOM PROCEDURE TRAY) ×2 IMPLANT
SHEATH GLIDE SLENDER 4/5FR (SHEATH) ×2 IMPLANT
TRANSDUCER W/STOPCOCK (MISCELLANEOUS) ×2 IMPLANT

## 2020-07-12 NOTE — Interval H&P Note (Signed)
History and Physical Interval Note:  07/12/2020 9:53 AM  Dawn Fowler  has presented today for surgery, with the diagnosis of hp.  The various methods of treatment have been discussed with the patient and family. After consideration of risks, benefits and other options for treatment, the patient has consented to  Procedure(s): RIGHT HEART CATH (N/A) as a surgical intervention.  The patient's history has been reviewed, patient examined, no change in status, stable for surgery.  I have reviewed the patient's chart and labs.  Questions were answered to the patient's satisfaction.     [name]

## 2020-07-12 NOTE — Discharge Instructions (Signed)
Return to Work __________________________JOHN GRIFFIN_________________________ was responsible adult for [employer] today.  07/12/20   _X__Not work-related. ___Undetermined if work-related. Return to work  Employee may return to work on ____9/16/21_________________.    Health care provider name (printed): _________________________________________ Health care provider (signature): _________________________________________ Date: _________________________________________ How to use this form Show this Return to Work statement to your supervisor at work as soon as possible. Your employer should be aware of your condition and may be able to help with the necessary work activity restrictions. Contact your health care provider if:  You wish to return to work sooner than the date that is listed above.  You have problems that make it difficult for you to return at that time. This information is not intended to replace advice given to you by your health care provider. Make sure you discuss any questions you have with your health care provider. Document Revised: 10/11/2017 Document Reviewed: 10/11/2017 Elsevier Patient Education  [place]. Brachial Site Care  This sheet gives you information about how to care for yourself after your procedure. Your health care provider may also give you more specific instructions. If you have problems or questions, contact your health care provider. What can I expect after the procedure? After the procedure, it is common to have:  Bruising and tenderness at the catheter insertion area. Follow these instructions at home: Medicines  Take over-the-counter and prescription medicines only as told by your health care provider. Insertion site care  Follow instructions from your health care provider about how to take care of your insertion site. Make sure you: ? Wash your hands with soap and water before you change your bandage (dressing). If soap  and water are not available, use hand sanitizer. ? Change your dressing as told by your health care provider. ? Leave stitches (sutures), skin glue, or adhesive strips in place. These skin closures may need to stay in place for 2 weeks or longer. If adhesive strip edges start to loosen and curl up, you may trim the loose edges. Do not remove adhesive strips completely unless your health care provider tells you to do that.  Check your insertion site every day for signs of infection. Check for: ? Redness, swelling, or pain. ? Fluid or blood. ? Pus or a bad smell. ? Warmth.  Do not take baths, swim, or use a hot tub until your health care provider approves.  You may shower 24-48 hours after the procedure, or as directed by your health care provider. ? Remove the dressing and gently wash the site with plain soap and water. ? Pat the area dry with a clean towel. ? Do not rub the site. That could cause bleeding.  Do not apply powder or lotion to the site. Activity   For 24 hours after the procedure, or as directed by your health care provider: ? Do not flex or bend the affected arm. ? Do not push or pull heavy objects with the affected arm. ? Do not drive yourself home from the hospital or clinic. You may drive 24 hours after the procedure unless your health care provider tells you not to. ? Do not operate machinery or power tools.  Do not lift anything that is heavier than 10 lb (4.5 kg), or the limit that you are told, until your health care provider says that it is safe.  Ask your health care provider when it is okay to: ? Return to work or school. ? Resume usual physical activities or  sports. ? Resume sexual activity. General instructions  If the catheter site starts to bleed, raise your arm and put firm pressure on the site. If the bleeding does not stop, get help right away. This is a medical emergency.  If you went home on the same day as your procedure, a responsible adult  should be with you for the first 24 hours after you arrive home.  Keep all follow-up visits as told by your health care provider. This is important. Contact a health care provider if:  You have a fever.  You have redness, swelling, or yellow drainage around your insertion site. Get help right away if:  You have unusual pain at the radial site.  The catheter insertion area swells very fast.  The insertion area is bleeding, and the bleeding does not stop when you hold steady pressure on the area.  Your arm or hand becomes pale, cool, tingly, or numb. These symptoms may represent a serious problem that is an emergency. Do not wait to see if the symptoms will go away. Get medical help right away. Call your local emergency services (911 in the [place] Do not drive yourself to the hospital. Summary  After the procedure, it is common to have bruising and tenderness at the site.  Follow instructions from your health care provider about how to take care of your radial site wound. Check the wound every day for signs of infection.  Do not lift anything that is heavier than 10 lb (4.5 kg), or the limit that you are told, until your health care provider says that it is safe. This information is not intended to replace advice given to you by your health care provider. Make sure you discuss any questions you have with your health care provider. Document Revised: 11/21/2017 Document Reviewed: 11/21/2017 Elsevier Patient Education  [place].  2. We'll work on starting you on Uptravi for pulmonary hypertension.

## 2020-07-12 NOTE — Research (Signed)
[place] Informed Consent   Subject Name: Dawn Fowler  Subject met inclusion and exclusion criteria.  The informed consent form, study requirements and expectations were reviewed with the subject and questions and concerns were addressed prior to the signing of the consent form.  The subject verbalized understanding of the trial requirements.  The subject agreed to participate in the [facility] trial and signed the informed consent at [place] on 07/12/20.  The informed consent was obtained prior to performance of any protocol-specific procedures for the subject.  A copy of the signed informed consent was given to the subject and a copy was placed in the subject's medical record.   [name]

## 2020-07-12 NOTE — Telephone Encounter (Signed)
Advanced Heart Failure Patient Advocate Encounter  Prior Authorization for Malvin Johns has been approved.    PA# [id number] Effective dates: 07/12/20 through 10/29/20  Audry Riles, PharmD, BCPS, BCCP, CPP Heart Failure Clinic Pharmacist [phone]

## 2020-07-12 NOTE — Telephone Encounter (Signed)
Received message from Dr. Aundra Fowler that patient will be started on Uptavi. Dawn Fowler patient enrollment form was started. Once completed, will fax to [facility].   Patient Advocate Encounter   Received notification from [employer] that prior authorization for Dawn Fowler is required.   PA submitted on CoverMyMeds Key [id number] Status is pending   Will continue to follow.   Audry Riles, PharmD, BCPS, BCCP, CPP Heart Failure Clinic Pharmacist [phone]

## 2020-07-12 NOTE — Consent Form (Signed)
[place] Informed Consent   Subject Name: Dawn Fowler  Subject met inclusion and exclusion criteria.  The informed consent form, study requirements and expectations were reviewed with the subject and questions and concerns were addressed prior to the signing of the consent form.  The subject verbalized understanding of the trial requirements.  The subject agreed to participate in the [facility] trial and signed the informed consent at [place] on 07/12/20.  The informed consent was obtained prior to performance of any protocol-specific procedures for the subject.  A copy of the signed informed consent was given to the subject and a copy was placed in the subject's medical record.   Evaristo Tsuda

## 2020-07-13 ENCOUNTER — Encounter (HOSPITAL_COMMUNITY): Payer: Self-pay | Admitting: Cardiology

## 2020-07-14 MED ORDER — UPTRAVI TITRATION 200 & 800 MCG PO TBPK: 200.0000 ug | ORAL_TABLET | Freq: Two times a day (BID) | ORAL | 11 refills | Status: DC

## 2020-07-14 NOTE — Telephone Encounter (Signed)
Sent in [place] Patient Enrollment form to [employer].   Application pending, will continue to follow.  Audry Riles, PharmD, BCPS, BCCP, CPP Heart Failure Clinic Pharmacist [phone]

## 2020-07-14 NOTE — Telephone Encounter (Signed)
Patient's signature is needed on the Uptravi patient enrollment form. Left patient VM asking her to call me back so we can obtain her signature.   Audry Riles, PharmD, BCPS, BCCP, CPP Heart Failure Clinic Pharmacist [phone]

## 2020-07-15 ENCOUNTER — Other Ambulatory Visit (HOSPITAL_COMMUNITY): Payer: Medicare Other

## 2020-07-21 DIAGNOSIS — G4733 Obstructive sleep apnea (adult) (pediatric): Secondary | ICD-10-CM

## 2020-07-21 DIAGNOSIS — Z9989 Dependence on other enabling machines and devices: Secondary | ICD-10-CM

## 2020-07-21 NOTE — Procedures (Signed)
   Patient Name: Dawn Fowler, Dawn Fowler Date: 07/01/2020 Gender: Female D.O.B: 01/15/1966 Age (years): 72 Referring Provider: Chi Rodman Pickle Height (inches): 28 Interpreting Physician: Baird Lyons MD, ABSM Weight (lbs): 263 RPSGT: Baxter Flattery BMI: 50 MRN: [id number] Neck Size: 16.00  CLINICAL INFORMATION Sleep Study Type: NPSG Indication for sleep study: Fatigue, Obesity, Snoring Epworth Sleepiness Score: 6  SLEEP STUDY TECHNIQUE As per the AASM Manual for the Scoring of Sleep and Associated Events v2.3 (April 2016) with a hypopnea requiring 4% desaturations.  The channels recorded and monitored were frontal, central and occipital EEG, electrooculogram (EOG), submentalis EMG (chin), nasal and oral airflow, thoracic and abdominal wall motion, anterior tibialis EMG, snore microphone, electrocardiogram, and pulse oximetry.  MEDICATIONS Medications self-administered by patient taken the night of the study : none reported  SLEEP ARCHITECTURE The study was initiated at 10:16:36 PM and ended at 4:55:31 AM.  Sleep onset time was 11.2 minutes and the sleep efficiency was 65.4%%. The total sleep time was 261 minutes.  Stage REM latency was 294.5 minutes.  The patient spent 8.8%% of the night in stage N1 sleep, 87.2%% in stage N2 sleep, 0.2%% in stage N3 and 3.8% in REM.  Alpha intrusion was absent.  Supine sleep was 0.96%.  RESPIRATORY PARAMETERS The overall apnea/hypopnea index (AHI) was 3.2 per hour. There were 1 total apneas, including 1 obstructive, 0 central and 0 mixed apneas. There were 13 hypopneas and 8 RERAs.  The AHI during Stage REM sleep was 12.0 per hour.  AHI while supine was 0.0 per hour.  The mean oxygen saturation was 89.0%. The minimum SpO2 during sleep was 85.0%.  moderate snoring was noted during this study.  CARDIAC DATA The 2 lead EKG demonstrated sinus rhythm. The mean heart rate was 79.9 beats per minute. Other EKG findings include:  PVCs  LEG  MOVEMENT DATA The total PLMS were 0 with a resulting PLMS index of 0.0. Associated arousal with leg movement index was 0.0 .  IMPRESSIONS - No significant obstructive sleep apnea occurred during this study (AHI = 3.2/h). - No significant central sleep apnea occurred during this study (CAI = 0.0/h). - The patient arrived on O2 6L. Study was begun on 4L and titrated up to 5L at 1:25 AM. - Oxygen desaturation was noted during this study (Min O2 = 85.0%). Mean sat 89.7%. Time with O2 sat 88% or less was 114.5 minutes. - The patient snored with moderate snoring volume. - EKG findings include PVCs. - Clinically significant periodic limb movements did not occur during sleep. No significant associated arousals.  DIAGNOSIS - Nocturnal Hypoxemia (G47.36)  RECOMMENDATIONS - Supplemental O2 at 6L is appropriate during sleep for dx Nocturnal Hypoxemia. - Be careful with alcohol, sedatives and other CNS depressants that may worsen sleep apnea and disrupt normal sleep architecture. - Sleep hygiene should be reviewed to assess factors that may improve sleep quality. - Weight management and regular exercise should be initiated or continued if appropriate.  [Electronically signed] 07/21/2020 01:53 PM  Baird Lyons MD, [place], American Board of Sleep Medicine   NPI: [id number]                         [place], [place] [facility]  ELECTRONICALLY SIGNED ON:  07/21/2020, 1:55 PM [place] PH: [phone]   FX: [phone] [place]

## 2020-08-04 ENCOUNTER — Other Ambulatory Visit (HOSPITAL_COMMUNITY): Payer: Self-pay | Admitting: Adult Health

## 2020-08-04 NOTE — Telephone Encounter (Signed)
Patient will receive Uptravi from [place].   Audry Riles, PharmD, BCPS, BCCP, CPP Heart Failure Clinic Pharmacist [phone]

## 2020-08-20 ENCOUNTER — Encounter (HOSPITAL_COMMUNITY): Payer: Self-pay | Admitting: Cardiology

## 2020-08-20 ENCOUNTER — Other Ambulatory Visit: Payer: Self-pay

## 2020-08-20 ENCOUNTER — Ambulatory Visit (HOSPITAL_COMMUNITY)
Admission: RE | Admit: 2020-08-20 | Discharge: 2020-08-20 | Disposition: A | Discharge status: Home / Self Care | Payer: Medicare Other | Source: Ambulatory Visit | Attending: Cardiology | Admitting: Cardiology

## 2020-08-20 VITALS — BP 112/78 | HR 80 | Wt 262.8 lb

## 2020-08-20 DIAGNOSIS — F329 Major depressive disorder, single episode, unspecified: Secondary | ICD-10-CM | POA: Insufficient documentation

## 2020-08-20 DIAGNOSIS — I2721 Secondary pulmonary arterial hypertension: Secondary | ICD-10-CM | POA: Diagnosis not present

## 2020-08-20 DIAGNOSIS — I50810 Right heart failure, unspecified: Secondary | ICD-10-CM

## 2020-08-20 DIAGNOSIS — Z8249 Family history of ischemic heart disease and other diseases of the circulatory system: Secondary | ICD-10-CM | POA: Insufficient documentation

## 2020-08-20 DIAGNOSIS — Z6841 Body Mass Index (BMI) 40.0 and over, adult: Secondary | ICD-10-CM

## 2020-08-20 DIAGNOSIS — Z87891 Personal history of nicotine dependence: Secondary | ICD-10-CM | POA: Insufficient documentation

## 2020-08-20 DIAGNOSIS — I5033 Acute on chronic diastolic (congestive) heart failure: Secondary | ICD-10-CM | POA: Diagnosis not present

## 2020-08-20 DIAGNOSIS — Z888 Allergy status to other drugs, medicaments and biological substances status: Secondary | ICD-10-CM | POA: Insufficient documentation

## 2020-08-20 DIAGNOSIS — G4733 Obstructive sleep apnea (adult) (pediatric): Secondary | ICD-10-CM | POA: Insufficient documentation

## 2020-08-20 DIAGNOSIS — I272 Pulmonary hypertension, unspecified: Secondary | ICD-10-CM | POA: Diagnosis not present

## 2020-08-20 DIAGNOSIS — N184 Chronic kidney disease, stage 4 (severe): Secondary | ICD-10-CM | POA: Insufficient documentation

## 2020-08-20 DIAGNOSIS — Z8616 Personal history of COVID-19: Secondary | ICD-10-CM | POA: Diagnosis not present

## 2020-08-20 DIAGNOSIS — Z86718 Personal history of other venous thrombosis and embolism: Secondary | ICD-10-CM | POA: Diagnosis not present

## 2020-08-20 DIAGNOSIS — I5032 Chronic diastolic (congestive) heart failure: Secondary | ICD-10-CM

## 2020-08-20 DIAGNOSIS — I130 Hypertensive heart and chronic kidney disease with heart failure and stage 1 through stage 4 chronic kidney disease, or unspecified chronic kidney disease: Secondary | ICD-10-CM | POA: Insufficient documentation

## 2020-08-20 DIAGNOSIS — Z7901 Long term (current) use of anticoagulants: Secondary | ICD-10-CM | POA: Insufficient documentation

## 2020-08-20 LAB — BASIC METABOLIC PANEL
Anion gap: 12 (ref 5–15)
BUN: 21 mg/dL — ABNORMAL HIGH (ref 6–20)
CO2: 20 mmol/L — ABNORMAL LOW (ref 22–32)
Calcium: 9.3 mg/dL (ref 8.9–10.3)
Chloride: 104 mmol/L (ref 98–111)
Creatinine, Ser: 1.86 mg/dL — ABNORMAL HIGH (ref 0.44–1.00)
GFR, Estimated: 32 mL/min — ABNORMAL LOW (ref >60)
Glucose, Bld: 129 mg/dL — ABNORMAL HIGH (ref 70–99)
Potassium: 3.4 mmol/L — ABNORMAL LOW (ref 3.5–5.1)
Sodium: 136 mmol/L (ref 135–145)

## 2020-08-20 LAB — BRAIN NATRIURETIC PEPTIDE: B Natriuretic Peptide: 57.6 pg/mL (ref 0.0–100.0)

## 2020-08-20 MED ORDER — SERTRALINE HCL 25 MG PO TABS: 25.0000 mg | ORAL_TABLET | Freq: Every day | ORAL | 2 refills | Status: DC

## 2020-08-20 NOTE — Patient Instructions (Signed)
START Sertraline 25 mg, one tab daily STOP Hydralazine  Labs today We will only contact you if something comes back abnormal or we need to make some changes. Otherwise no news is good news!  You have been referred to [facility] Address: [address], [place], [place] [address] Phone: [phone]  Your physician recommends that you schedule a follow-up appointment in: 2 months  If you have any questions or concerns before your next appointment please send [place] a message through [place] or call our office at [phone].    TO LEAVE A MESSAGE FOR THE NURSE SELECT OPTION 2, PLEASE LEAVE A MESSAGE INCLUDING: . YOUR NAME . DATE OF BIRTH . CALL BACK NUMBER . REASON FOR CALL**this is important as we prioritize the call backs  YOU WILL RECEIVE A CALL BACK THE SAME DAY AS LONG AS YOU CALL BEFORE 4:00 PM

## 2020-08-20 NOTE — Progress Notes (Signed)
6 Min Walk Test Completed  Pt ambulated 121.92 meters O2 Sat ranged 90% to 95% on 6L oxygen HR ranged 87 to 120.

## 2020-08-22 NOTE — Progress Notes (Addendum)
ID:  Dawn Fowler, DOB [birth year], MRN [id number]    Provider location: [place] [facility] Type of Visit: Established patient   PCP:  Hayden Rasmussen, MD  Cardiologist:  Dr. Aundra Dubin   History of Present Illness: Dawn Fowler is a 54 y.o. 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  She is on CPAP at home and 6L home oxygen (says she has been on oxygen for "years.").  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  She additionally has significant HTN.  She had been on Opsumit 10 + tadalafil 20 mg daily but ran out of this prior to her 2/21 admission. She also has a history of prior DVT after long train ride, has had 2 negative V/Q scans.  She was hospitalized for COVID-19 at [facility] in 1/21.   She was seen by [facility] after discharge from [place] admission and was noted to be volume overloaded, Lasix was changed to torsemide but she was unable to get to pharmacy to pick up. Subsequently, she developed progressive wt gain, nearly 20 lb and increased dyspnea, prompting her to report back to the ED in 2/21, where she was found to be in acute on chronic diastolic HF and readmitted for IV diuresis and AHF consultation. Echo in 2/21 showed LVEF 55-60%, moderate RV dilation/moderate RV dysfunction. [place] 2/21 showed elevated left and right heart filling pressures with severe pulmonary hypertension. PFTs completed showing severe restriction and severely decreased DLCO. High resolution CT was concerning  for small vessel disease, no ILD, and concerning for PAH.  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 She was continued on IV diuretics and had good response, diuresing 32 lb. She was transitioned to  torsemide 40 mg/20 mg. Opsumit and tadalafil restarted.   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  She is on Eliquis. RHC in 9/21 showed normal RA pressure and PCWP, moderate-severe pulmonary arterial hypertension.   She presents today for followup of CHF/RV failure and pulmonary hypertension.  Weight is stable.  She continues to use home oxygen and CPAP.  She has had some episodes of lightheadedness in the last few weeks, SBP running in 90s at home.  Still short of breath after walking for a couple of minutes on flat ground, remains limited.  Her mood is depressed and she sleeps poorly.  She has started [place], she is tolerating it with minimal side effects other than loose stool.   Labs (6/21): K 4.5, creatinine 2.09 Labs (8/21): K 4.3, creatinine 2.11 Labs (9/21): K 3.9, creatinine 2.0  6 minute walk (9/21): 168 m 6 minute walk (10/21): 122 m  ECG (personally reviewed): NSR, normal  PMH: 1. OHS/OSA: Uses home oxygen.  2. CKD stage IV 3. COVID-19 PNA in 12/20 4. Restrictive lung disease: PFTs (2/21) with severe restriction, severely decreased DLCO.  - High resolution CT chest (2/21): no interstitial lung disease, "small vessel disease."  5. Gout 6. HTN 7. DVT: Remote, after train ride.  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  8. Pulmonary hypertension: RHC (2/21) with mean RA 14, PA 89/33 mean 54, mean PCWP 34, CI 2.22, PVR 3.87 WU =>  mixed pulmonary arterial/pulmonary venous hypertension.  - V/Q scan (2/21): No evidence for chronic PE.  - Echo (2/21): EF 55-60%, moderately dilated RV with moderately decreased systolic function.  - HIV and rheumatologic serologies negative.  - RHC (9/21): mean RA 9, PA 68/23 mean 40, mean PCWP 11, PVR 5.6 WU, no step up in oxygen saturation so no evidence for left to right shunting.  9. Depression   Current Outpatient Medications  Medication Sig Dispense Refill   acetaminophen (TYLENOL) 500 MG tablet  Take 1,000 mg by mouth every 6 (six) hours as needed for moderate pain or headache.     allopurinol (ZYLOPRIM) 100 MG tablet Take 100 mg by mouth daily.     apixaban (ELIQUIS) 5 MG TABS tablet Take 1 tablet (5 mg total) by mouth 2 (two) times daily. 60 tablet 3   Biotin w/ Vitamins C & E (HAIR SKIN & NAILS GUMMIES PO) Take 2 tablets by mouth daily.     Cholecalciferol (VITAMIN D-3) 125 MCG (5000 UT) TABS Take 5,000 Units by mouth daily.      colchicine 0.6 MG tablet Take 0.6 mg by mouth daily as needed (gout flare).     diphenhydramine-acetaminophen (TYLENOL PM) 25-500 MG TABS tablet Take 2 tablets by mouth at bedtime as needed (sleep).     fluticasone furoate-vilanterol (BREO ELLIPTA) 200-25 MCG/INH AEPB Inhale 1 puff into the lungs daily. 60 each 5   gabapentin (NEURONTIN) 100 MG capsule Take 300 mg by mouth at bedtime.      ipratropium-albuterol (DUONEB) 0.5-2.5 (3) MG/3ML SOLN Take 3 mLs by nebulization.     macitentan (OPSUMIT) 10 MG tablet Take 1 tablet (10 mg total) by mouth daily. 30 tablet 11   Multiple Vitamins-Minerals (ADULT GUMMY) CHEW Chew 2 tablets by mouth daily.     NIFEdipine (ADALAT CC) 60 MG 24 hr tablet Take 60 mg by mouth 2 (two) times daily.     OXYGEN Inhale 4-8 L into the lungs continuous.     potassium chloride SA (KLOR-CON) 20 MEQ tablet Take 20 mEq by mouth daily.      PREMARIN 0.625 MG tablet Take 0.625 mg by mouth daily.     spironolactone (ALDACTONE) 25 MG tablet TAKE 1/2 TABLET(12.5 MG) BY MOUTH DAILY 45 tablet 3   tadalafil, PAH, (ALYQ) 20 MG tablet Take 2 tablets (40 mg total) by mouth daily. 60 tablet 11   telmisartan (MICARDIS) 40 MG tablet Take 40 mg by mouth daily.      torsemide (DEMADEX) 20 MG tablet Take 2 tablets (40 mg total) by mouth 2 (two) times daily. 120 tablet 3   traMADol (ULTRAM) 50 MG tablet Take 50 mg by mouth every 6 (six) hours as needed for moderate pain or severe pain.      TURMERIC-GINGER PO Take 2 tablets by mouth  daily.     UNABLE TO FIND Med Name: Selexipag 400 mg po twice daily     sertraline (ZOLOFT) 25 MG tablet Take 1 tablet (25 mg total) by mouth daily. 30 tablet 2   No current facility-administered medications for this encounter.    Allergies  Allergen Reactions   Adhesive [Tape] Hives    Tolerates paper tape   Succinylcholine Other (See Comments)    Have trouble waking up   Tizanidine Hives      Social History   Socioeconomic History   Marital status: Single    Spouse name: Not on file   Number of children: Not on file  Years of education: Not on file   Highest education level: Not on file  Occupational History   Not on file  Tobacco Use   Smoking status: Former Smoker   Smokeless tobacco: Never Used   Tobacco comment: only smoked for fun in highschool not even one a day  Vaping Use   Vaping Use: Never used  Substance and Sexual Activity   Alcohol use: Yes    Comment: occ   Drug use: Not Currently   Sexual activity: Yes    Partners: Male    Comment: 1st intercourse- 62, partners- 44, current partner- 10 yrs   Other Topics Concern   Not on file  Social History Narrative   Not on file   Social Determinants of Health   Financial Resource Strain:    Difficulty of Paying Living Expenses: Not on file  Food Insecurity:    Worried About Charity fundraiser in the Last Year: Not on file   [place] in the Last Year: Not on file  Transportation Needs:    Lack of Transportation (Medical): Not on file   Lack of Transportation (Non-Medical): Not on file  Physical Activity:    Days of Exercise per Week: Not on file   Minutes of Exercise per Session: Not on file  Stress:    Feeling of Stress : Not on file  Social Connections:    Frequency of Communication with Friends and Family: Not on file   Frequency of Social Gatherings with Friends and Family: Not on file   Attends Religious Services: Not on file   Active Member of Clubs or  Organizations: Not on file   Attends Archivist Meetings: Not on file   Marital Status: Not on file  Intimate Partner Violence:    Fear of Current or Ex-Partner: Not on file   Emotionally Abused: Not on file   Physically Abused: Not on file   Sexually Abused: Not on file      Family History  Problem Relation Age of Onset   COPD Father    Cancer Mother        gallbladder and bladder    Hypertension Brother    Breast cancer Other    Diabetes Son    Breast cancer Cousin    Breast cancer Cousin    Pulmonary Hypertension Neg Hx     Vitals:   08/20/20 1144  BP: 112/78  Pulse: 80  SpO2: 97%  Weight: 119.2 kg (262 lb 12.8 oz)   Wt Readings from Last 3 Encounters:  08/20/20 119.2 kg (262 lb 12.8 oz)  07/12/20 119.3 kg (263 lb)  07/01/20 119.3 kg (263 lb)   Exam:   BP 112/78    Pulse 80    Wt 119.2 kg (262 lb 12.8 oz)    LMP  (LMP Unknown) Comment: pmb last 2-3 months   SpO2 97% Comment: 6l 02   BMI 49.66 kg/m  General: NAD Neck: No JVD, no thyromegaly or thyroid nodule.  Lungs: Clear to auscultation bilaterally with normal respiratory effort. CV: Nondisplaced PMI.  Heart regular S1/S2, no S3/S4, no murmur.  No peripheral edema.  No carotid bruit.  Normal pedal pulses.  Abdomen: Soft, nontender, no hepatosplenomegaly, no distention.  Skin: Intact without lesions or rashes.  Neurologic: Alert and oriented x 3.  Psych: Normal affect. Extremities: No clubbing or cyanosis.  HEENT: Normal.    ASSESSMENT & PLAN:  1. Chronic diastolic CHF with significant RV dysfunction: Echo in  2/21 with LVEF 55-60%, moderate RV dilation/moderate RV dysfunction.  [place] 2/18 showed elevated left and right heart filling pressures with severe pulmonary hypertension.  Suspect mixed pulmonary venous/pulmonary arterial hypertension in setting of significant LV diastolic dysfunction (PVR 3.87 WU). Repeat RHC in 9/21 with moderate-severe PAH, normal filling pressures, and PVR 5.6  WU.  No evidence for left=>right shunting.  NYHA class III symptoms, stable.  Weight stable.   - Continue torsemide 40 mg bid, BMET today.  2. Pulmonary hypertension: Suspect that this is a mixed picture with group 2 and group 3 PH (related to diastolic LV dysfunction and OHS/OSA).  However, concerned for component of group 1 PAH as well. V/Q scan not suggestive of chronic PEs and high resolution CT chest in 2/21 was not suggestive of ILD. HIV and rheumatologic serologies negative. RHC showed severe mixed pulmonary venous/pulmonary arterial hypertension, repeat RHC after diuresis in 9/21 showed PAH. PFTs completed in 2/21 showed severe restriction, severely decreased DLCO (?restriction due to body habitus). She is on Opsumit and tadalafil given concern for group 1 PH component, Uptravi recently started. 6 minute walk mildly less than prior today.  - Continue to maintain oxygen saturation with home O2 and CPAP. Weight loss also would be helpful.  - Continue Opsumit 10 mg daily. - Continue tadalafil 40 mg daily.  - Continue uptitration of Uptravi.     - Check BNP today 3. HTN: BP low at times.   - Continue nifedipine CR 60 mg bid.  - Continue telmisartan 40 daily.  - With occasional orthostatic symptoms, stop hydralazine.  4. CKD stage 3b-4: baseline SCr ~2 - BMET today.  - Followup with nephrology.  5. OHS/OSA: compliant w/ CPAP and home oxygen.  - Needs weight loss. I will refer her to [facility]. She asks about breast reduction => I think this could potentially help lung restriction but need to make sure she is as optimized from a cardiopulmonary standpoint as possible before considering surgery.  6. 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  As she is at baseline sedentary and has had recurrent DVTs, would continue Eliquis long-term.   7. Depression: Add sertraline 25 mg daily.   Signed, [name], MD  08/22/2020  [facility] [address] [facility] [place] [address] [phone] (office) [phone] (fax)

## 2020-08-22 NOTE — Addendum Note (Signed)
Encounter addended by: Larey Dresser, MD on: 08/22/2020 9:48 PM  Actions taken: Clinical Note Signed

## 2020-08-24 ENCOUNTER — Encounter (HOSPITAL_COMMUNITY): Payer: Self-pay

## 2020-08-24 ENCOUNTER — Telehealth: Payer: Self-pay | Admitting: General Practice

## 2020-08-24 NOTE — Telephone Encounter (Signed)
Patient stated she was returning a call. I informed her it was not our office who called, but Dr. Claris Gladden. Transferred her to CHF clinic.

## 2020-09-20 ENCOUNTER — Encounter: Payer: Self-pay | Admitting: Pulmonary Disease

## 2020-09-20 ENCOUNTER — Other Ambulatory Visit: Payer: Self-pay

## 2020-09-20 ENCOUNTER — Ambulatory Visit (INDEPENDENT_AMBULATORY_CARE_PROVIDER_SITE_OTHER): Payer: Medicare Other | Admitting: Pulmonary Disease

## 2020-09-20 VITALS — BP 112/70 | HR 89 | Temp 97.3°F | Wt 259.1 lb

## 2020-09-20 DIAGNOSIS — I272 Pulmonary hypertension, unspecified: Secondary | ICD-10-CM | POA: Diagnosis not present

## 2020-09-20 DIAGNOSIS — G4733 Obstructive sleep apnea (adult) (pediatric): Secondary | ICD-10-CM

## 2020-09-20 DIAGNOSIS — Z23 Encounter for immunization: Secondary | ICD-10-CM | POA: Diagnosis not present

## 2020-09-20 DIAGNOSIS — J9611 Chronic respiratory failure with hypoxia: Secondary | ICD-10-CM | POA: Diagnosis not present

## 2020-09-20 DIAGNOSIS — Z9989 Dependence on other enabling machines and devices: Secondary | ICD-10-CM

## 2020-09-20 NOTE — Progress Notes (Signed)
Subjective:   PATIENT ID: Dawn Fowler GENDER: female DOB: 1966/03/25, MRN: [id number]   Chief Complaint  Patient presents with  . Follow-up    had SLeep study done 9/2--here for the results.   Reason for Visit: Follow-up visit  [name] is a 54 year old female never smoker with severe OSA, hx DVT 2017 s/p Eliquis x 6 months, recent COVID-19 infection requiring hospitalization (10/29/2019-11/03/2019), stage III CKD who presents for follow-up.  Synopsis: Previously followed for chronic hypoxemic failure 2/2 PH and severe OSA at the [facility] in [place], [place]. Work-up included RHC 12/2016 RA 19 PA 76/40/48 PCWP 20 CO 4.02L/min CI 1.9, V/Q scan (neg), PFTs 06/2018 (mod restrictive defect), CT Chest (no evidence of ILD). Started on Opsumit and tadalafil. Moved to [place] in 2020. She establishing care in Pulmonary clinic, she was found to be in volume overload. She eventually was admitted for acute diastolic heart failure for IV diuretics. She underwent repeat work-up for her PH including RHC 12/2019 which demonstrated elevated right and left heart filling pressure with severe PH, V/Q scan (neg), PFT 12/2019 (severe restriction and reduced DLCO), HRCT (no evidence of ILD) and negative serologies.  Interval 08/20/20 Since our last visit, she had a sleep study completed however did not sleep without interruption and felt the experience was overall poor.  She did not feel the sleep study was representative of what she did at home.  Otherwise she reports her dyspnea is at baseline and that she has been compliant with therapies including her home oxygen 4 L at rest and 8 L on exertion.  She continues to wear her CPAP which she finds comfortable and improves her dyspnea and quality of sleep.  When she does not wear her CPAP she notices a difference in her breathing. She has been participating PT and noticed her SpO2 to low 80s on 6L via [place]. She recovers within 2 minutes with rest with sats  to upper 90s. Cardiology has added uptravi to her current regiment of Opsumit and tadalfil. She has diarrhea that is controlled lomotil. She has been referred to weight loss clinic but not heard by it. She is considering a breast reduction.  Denies dizziness, chest pain, cough, wheezing or leg swelling.  No recent infections.  She also has questions about Covid and shingles vaccines.  Past Medical History:  Diagnosis Date  . Acute respiratory failure due to COVID-19 ([place]) 10/30/2019  . Arthritis   . CKD (chronic kidney disease), stage III ([place])   . Complication of anesthesia    hard to wake up after general anesthesia - had trouble waking up after succinylcholine (consider possibility of pseudocholinesterase deficiency)  . COVID-19 virus detected 11/20/2019   Tested positive for Covid in December/2020 Required hospitalization in December/2020 through January/2021 Received Redemsivir  . Dyspnea   . Family history of adverse reaction to anesthesia    hard to wake up after general anesthesia  . GERD (gastroesophageal reflux disease)   . HLD (hyperlipidemia)   . HTN (hypertension)   . Pre-diabetes    no meds  . Pulmonary HTN ([place])   . Sleep apnea    on CPAP    Outpatient Medications Prior to Visit  Medication Sig Dispense Refill  . acetaminophen (TYLENOL) 500 MG tablet Take 1,000 mg by mouth every 6 (six) hours as needed for moderate pain or headache.    . allopurinol (ZYLOPRIM) 100 MG tablet Take 100 mg by mouth daily.    Marland Kitchen apixaban (  ELIQUIS) 5 MG TABS tablet Take 1 tablet (5 mg total) by mouth 2 (two) times daily. 60 tablet 3  . Biotin w/ Vitamins C & E (HAIR SKIN & NAILS GUMMIES PO) Take 2 tablets by mouth daily.    . Cholecalciferol (VITAMIN D-3) 125 MCG (5000 UT) TABS Take 5,000 Units by mouth daily.     . colchicine 0.6 MG tablet Take 0.6 mg by mouth daily as needed (gout flare).    . diphenhydramine-acetaminophen (TYLENOL PM) 25-500 MG TABS tablet Take 2 tablets by mouth at bedtime  as needed (sleep).    . fluticasone furoate-vilanterol (BREO ELLIPTA) 200-25 MCG/INH AEPB Inhale 1 puff into the lungs daily. 60 each 5  . gabapentin (NEURONTIN) 100 MG capsule Take 300 mg by mouth at bedtime.     Marland Kitchen ipratropium-albuterol (DUONEB) 0.5-2.5 (3) MG/3ML SOLN Take 3 mLs by nebulization.    . macitentan (OPSUMIT) 10 MG tablet Take 1 tablet (10 mg total) by mouth daily. 30 tablet 11  . Multiple Vitamins-Minerals (ADULT GUMMY) CHEW Chew 2 tablets by mouth daily.    Marland Kitchen NIFEdipine (ADALAT CC) 60 MG 24 hr tablet Take 60 mg by mouth 2 (two) times daily.    . OXYGEN Inhale 4-8 L into the lungs continuous.    . potassium chloride SA (KLOR-CON) 20 MEQ tablet Take 20 mEq by mouth daily.     Marland Kitchen PREMARIN 0.625 MG tablet Take 0.625 mg by mouth daily.    . sertraline (ZOLOFT) 25 MG tablet Take 1 tablet (25 mg total) by mouth daily. 30 tablet 2  . spironolactone (ALDACTONE) 25 MG tablet TAKE 1/2 TABLET(12.5 MG) BY MOUTH DAILY 45 tablet 3  . tadalafil, PAH, (ALYQ) 20 MG tablet Take 2 tablets (40 mg total) by mouth daily. 60 tablet 11  . telmisartan (MICARDIS) 40 MG tablet Take 40 mg by mouth daily.     Marland Kitchen torsemide (DEMADEX) 20 MG tablet Take 2 tablets (40 mg total) by mouth 2 (two) times daily. 120 tablet 3  . traMADol (ULTRAM) 50 MG tablet Take 50 mg by mouth every 6 (six) hours as needed for moderate pain or severe pain.     . TURMERIC-GINGER PO Take 2 tablets by mouth daily.    Marland Kitchen UNABLE TO FIND Med Name: Selexipag 400 mg po twice daily    . UPTRAVI 200 & 800 MCG TBPK Take by mouth.     No facility-administered medications prior to visit.    Review of Systems  Constitutional: Negative for chills, diaphoresis, fever, malaise/fatigue and weight loss.  HENT: Negative for congestion.   Respiratory: Positive for shortness of breath. Negative for cough, hemoptysis, sputum production and wheezing.   Cardiovascular: Negative for chest pain, palpitations and leg swelling.     Objective:   Vitals:     09/20/20 1158  BP: 112/70  Pulse: 89  Temp: (!) 97.3 F (36.3 C)  TempSrc: Tympanic  SpO2: 95%  Weight: 259 lb 2 oz (117.5 kg)      Physical Exam: General: Well-appearing, obese, no acute distress HENT: [place], AT Eyes: EOMI, no scleral icterus Respiratory: Clear to auscultation bilaterally.  No crackles, wheezing or rales Cardiovascular: RRR, -M/R/G, no JVD Extremities:-Edema,-tenderness Neuro: AAO x4, CNII-XII grossly intact Skin: Intact, no rashes or bruising Psych: Normal mood, normal affect  Data Reviewed:  Imaging:  12/25/2014-VQ scan-mild obstructive airways disease, very low probability of PE  12/18/2014-CTA anterior-no evidence of PE, cardiomegaly, dependent airspace trapping which may be related to morbid obesity or small  airways disease Karen Kays to view images but can read radiology report  07/26/18 CT Chest WO-(report only) enlarged MPA and centroal pulmonary arteries consistent with hx of pulmonary hypertension.  Stable predominantly basilar mosaic attenuation reflects either small vessel or small airways disease.  Persistent predominant subcarinal, paraesophageal and bilateral infrahilar lymph node enlargement unchanged from 4 months ago.  Stable small perifissural nodule likely representing intranodular lymph node.  Other concerning parenchymal lesions not seen. -Report does not mention any interstitial lung disease.  05/11/2020- CXR cardiomegaly. No pulmonary edema or infiltrate  12/21/19 -VQ scan- no evidence of chronic PE  PFT: 02/11/2015-office spirometry-FVC 2.14 L, FEV1 1.78, ratio 83  12/22/19  FVC 1.43 (48%) FEV1 1.27 (54%) Ratio 87  TLC 64% DLCO 32% Interpretation: No evidence of obstructive defect however significant response to bronchodilators suggestive of asthma. Co-comitant severe restrictive lung defect with reduced gas exchange also present.  Echo 12/21/2014 LV ejection fraction 60 to 65%, moderate pulmonary hypertension, moderate diastolic  dysfunction  06/07/97  LV EF 55-60%, moderately dilated RV with decreased systolic function    3/38/25 RHC Procedural Findings: Hemodynamics (mmHg) RA mean 14 RV 87/21 PA 89/33, mean 54 PCWP mean 34 Oxygen saturations: SVC 63% PA 61% AO 94% Cardiac Output (Fick) 5.17  Cardiac Index (Fick) 2.22 PVR 3.87 WU  Imaging, labs and tests noted above have been reviewed independently by me.  Assessment & Plan:   Discussion: 54 year old female never smoker who presents for follow-up for severe pulmonary hypertension and severe OSA.  Since her last visit she has had a sleep study which recommended nocturnal oxygen normal AHI.  She is adamant that this does not represent her usual sleep and the CPAP does provide benefit so we will continue this therapy.  Cardiology is managing her pulmonary hypertension medications and was recently started on Uptravi which she is tolerating fairly.  She is not a lung transplant candidate due to her BMI.  We will focus our efforts on weight loss and possible surgery.  Due to her pulmonary hypertension she would be high risk in the OR however if she is able to lose a significant amount of weight, I believe her respiratory status would benefit.  Severe Pulmonary Hypertension Group II, III Unable to rule out group I (neg autoimmune serologies) Not a transplant candidate due to BMI Chronic hypoxemic respiratory failure NYHA class III symptoms, unchanged --Continue supplemental oxygen as prescribed --Continue medications per Cardiology: Opsumit, tadalfil, uptravi (ERA, PD5, prostacyclin) --Re-refer to [facility]   Severe OSA on CPAP --Continue CPAP on current settings  --Advised against driving when sleepy  Moderately severe asthma Restrictive defect likely related to chronic diastolic heart failure --Continue Breo --Continue nebs PRN  Chronic diastolic heart failure, RV dysfunction --Continue diuretics per Cardiology  Recurrent  DVT Hx of RLE DVT in 05/2020 and 2017. Diagnosed peri-travel however given recurrence, may need to consider lifelong anticoagulation. Cardiology agrees with long-term anticoagulation. --Continue Eliquis  Health Maintenance Immunization History  Administered Date(s) Administered  . Influenza Whole 08/20/2019  . Influenza,inj,Quad PF,6+ Mos 06/25/2018  . PFIZER SARS-COV-2 Vaccination 02/02/2020, 02/24/2020  . Pneumococcal Polysaccharide-23 07/17/2014   CT Lung Screen -never smoker, would not qualify  I have spent a total time of 35-minutes on the day of the appointment reviewing prior documentation, coordinating care and discussing medical diagnosis and plan with the patient/family. Imaging, labs and tests included in this note have been reviewed and interpreted independently by me.  Zi Sek Rodman Pickle, MD [facility] 09/20/2020 11:55 AM  Office Number [phone]

## 2020-09-20 NOTE — Patient Instructions (Signed)
Follow up in 3 months

## 2020-10-11 ENCOUNTER — Telehealth: Payer: Self-pay | Admitting: Pulmonary Disease

## 2020-10-11 NOTE — Telephone Encounter (Signed)
Called spoke with patient.  This is a medication that Dr. Aundra Dubin had RX'd for patient, told her to contact his office and let him know   Patient voiced understanding.  Nothing further needed at this time

## 2020-10-12 ENCOUNTER — Telehealth (HOSPITAL_COMMUNITY): Payer: Self-pay | Admitting: *Deleted

## 2020-10-12 NOTE — Telephone Encounter (Signed)
Pt left VM c/o of "bad side effects" from taking Uptravi. I called to get more information and pt did not answer. I left a VM requesting return call.

## 2020-10-13 ENCOUNTER — Telehealth (HOSPITAL_COMMUNITY): Payer: Self-pay

## 2020-10-13 DIAGNOSIS — I5032 Chronic diastolic (congestive) heart failure: Secondary | ICD-10-CM

## 2020-10-13 NOTE — Telephone Encounter (Signed)
Patient called stating that she thinks she is having a reaction to the increase of 1000mg  in [place]. Yesterday she decreased her dosage back to 800mg  but stated she has been having side affects all along.She has been experiencing Headaches,diarrhea( couple of weeks),stomach and back cramping,gas. She reports she hasn't had a regular bowel movement since starting Uptravi and doesn't like the medication at all. She's also concerned that she may be dehydrated. She has been taking Tylenol and medication to help with her stools. Please advise.

## 2020-10-13 NOTE — Telephone Encounter (Signed)
Patient advised and verbalized understanding. Lab appt scheduled, lab order entered.   

## 2020-10-13 NOTE — Telephone Encounter (Signed)
Keep at the 800 bid and give it time. Usually symptoms will improve over time. Can get BMET to make sure no evidence for dehydration.

## 2020-10-13 NOTE — Addendum Note (Signed)
Addended by: Shela Nevin R on: 46/96/2952 02:47 PM   Modules accepted: Orders

## 2020-10-14 ENCOUNTER — Other Ambulatory Visit: Payer: Self-pay

## 2020-10-14 ENCOUNTER — Ambulatory Visit (HOSPITAL_COMMUNITY)
Admission: RE | Admit: 2020-10-14 | Discharge: 2020-10-14 | Disposition: A | Discharge status: Home / Self Care | Payer: Medicare Other | Source: Ambulatory Visit | Attending: Cardiology | Admitting: Cardiology

## 2020-10-14 DIAGNOSIS — I5032 Chronic diastolic (congestive) heart failure: Secondary | ICD-10-CM | POA: Diagnosis not present

## 2020-10-14 LAB — BASIC METABOLIC PANEL
Anion gap: 11 (ref 5–15)
BUN: 34 mg/dL — ABNORMAL HIGH (ref 6–20)
CO2: 21 mmol/L — ABNORMAL LOW (ref 22–32)
Calcium: 9.4 mg/dL (ref 8.9–10.3)
Chloride: 106 mmol/L (ref 98–111)
Creatinine, Ser: 2.5 mg/dL — ABNORMAL HIGH (ref 0.44–1.00)
GFR, Estimated: 22 mL/min — ABNORMAL LOW (ref >60)
Glucose, Bld: 122 mg/dL — ABNORMAL HIGH (ref 70–99)
Potassium: 4.1 mmol/L (ref 3.5–5.1)
Sodium: 138 mmol/L (ref 135–145)

## 2020-10-15 ENCOUNTER — Other Ambulatory Visit (HOSPITAL_COMMUNITY): Payer: Medicare Other

## 2020-10-15 ENCOUNTER — Encounter (HOSPITAL_COMMUNITY): Payer: Self-pay | Admitting: Cardiology

## 2020-10-21 ENCOUNTER — Other Ambulatory Visit (HOSPITAL_COMMUNITY): Payer: Self-pay

## 2020-10-21 DIAGNOSIS — I5032 Chronic diastolic (congestive) heart failure: Secondary | ICD-10-CM

## 2020-10-25 ENCOUNTER — Other Ambulatory Visit (HOSPITAL_COMMUNITY): Payer: Medicare Other

## 2020-10-26 ENCOUNTER — Other Ambulatory Visit: Payer: Self-pay

## 2020-10-26 ENCOUNTER — Ambulatory Visit (HOSPITAL_COMMUNITY)
Admission: RE | Admit: 2020-10-26 | Discharge: 2020-10-26 | Disposition: A | Discharge status: Home / Self Care | Payer: Medicare Other | Source: Ambulatory Visit | Attending: Cardiology | Admitting: Cardiology

## 2020-10-26 DIAGNOSIS — I5032 Chronic diastolic (congestive) heart failure: Secondary | ICD-10-CM | POA: Diagnosis not present

## 2020-10-26 LAB — BASIC METABOLIC PANEL
Anion gap: 12 (ref 5–15)
BUN: 32 mg/dL — ABNORMAL HIGH (ref 6–20)
CO2: 21 mmol/L — ABNORMAL LOW (ref 22–32)
Calcium: 9.4 mg/dL (ref 8.9–10.3)
Chloride: 109 mmol/L (ref 98–111)
Creatinine, Ser: 2.28 mg/dL — ABNORMAL HIGH (ref 0.44–1.00)
GFR, Estimated: 25 mL/min — ABNORMAL LOW (ref >60)
Glucose, Bld: 109 mg/dL — ABNORMAL HIGH (ref 70–99)
Potassium: 3.8 mmol/L (ref 3.5–5.1)
Sodium: 142 mmol/L (ref 135–145)

## 2020-11-01 ENCOUNTER — Other Ambulatory Visit (HOSPITAL_COMMUNITY): Payer: Self-pay | Admitting: *Deleted

## 2020-11-01 MED ORDER — TORSEMIDE 20 MG PO TABS
40.0000 mg | ORAL_TABLET | Freq: Two times a day (BID) | ORAL | 3 refills | Status: DC
Start: 2020-11-01 — End: 2021-03-08

## 2020-11-01 MED ORDER — APIXABAN 5 MG PO TABS
5.0000 mg | ORAL_TABLET | Freq: Two times a day (BID) | ORAL | 3 refills | Status: DC
Start: 2020-11-01 — End: 2021-11-25

## 2020-11-11 ENCOUNTER — Encounter (HOSPITAL_COMMUNITY): Payer: Self-pay | Admitting: Cardiology

## 2020-11-11 ENCOUNTER — Ambulatory Visit (HOSPITAL_COMMUNITY)
Admission: RE | Admit: 2020-11-11 | Discharge: 2020-11-11 | Disposition: A | Discharge status: Home / Self Care | Payer: Medicare Other | Source: Ambulatory Visit | Attending: Cardiology | Admitting: Cardiology

## 2020-11-11 ENCOUNTER — Telehealth (HOSPITAL_COMMUNITY): Payer: Self-pay | Admitting: Pharmacy Technician

## 2020-11-11 ENCOUNTER — Other Ambulatory Visit: Payer: Self-pay

## 2020-11-11 VITALS — BP 160/90 | HR 79

## 2020-11-11 DIAGNOSIS — F32A Depression, unspecified: Secondary | ICD-10-CM | POA: Diagnosis not present

## 2020-11-11 DIAGNOSIS — I2721 Secondary pulmonary arterial hypertension: Secondary | ICD-10-CM | POA: Diagnosis not present

## 2020-11-11 DIAGNOSIS — I50810 Right heart failure, unspecified: Secondary | ICD-10-CM | POA: Diagnosis not present

## 2020-11-11 DIAGNOSIS — Z7901 Long term (current) use of anticoagulants: Secondary | ICD-10-CM | POA: Insufficient documentation

## 2020-11-11 DIAGNOSIS — N184 Chronic kidney disease, stage 4 (severe): Secondary | ICD-10-CM | POA: Insufficient documentation

## 2020-11-11 DIAGNOSIS — I272 Pulmonary hypertension, unspecified: Secondary | ICD-10-CM

## 2020-11-11 DIAGNOSIS — Z87891 Personal history of nicotine dependence: Secondary | ICD-10-CM | POA: Insufficient documentation

## 2020-11-11 DIAGNOSIS — I5032 Chronic diastolic (congestive) heart failure: Secondary | ICD-10-CM | POA: Diagnosis not present

## 2020-11-11 DIAGNOSIS — G4733 Obstructive sleep apnea (adult) (pediatric): Secondary | ICD-10-CM | POA: Insufficient documentation

## 2020-11-11 DIAGNOSIS — I130 Hypertensive heart and chronic kidney disease with heart failure and stage 1 through stage 4 chronic kidney disease, or unspecified chronic kidney disease: Secondary | ICD-10-CM | POA: Diagnosis not present

## 2020-11-11 DIAGNOSIS — I82502 Chronic embolism and thrombosis of unspecified deep veins of left lower extremity: Secondary | ICD-10-CM | POA: Insufficient documentation

## 2020-11-11 DIAGNOSIS — Z7951 Long term (current) use of inhaled steroids: Secondary | ICD-10-CM | POA: Insufficient documentation

## 2020-11-11 DIAGNOSIS — Z79899 Other long term (current) drug therapy: Secondary | ICD-10-CM | POA: Insufficient documentation

## 2020-11-11 DIAGNOSIS — Z8616 Personal history of COVID-19: Secondary | ICD-10-CM | POA: Diagnosis not present

## 2020-11-11 HISTORY — DX: Heart failure, unspecified: I50.9

## 2020-11-11 LAB — BASIC METABOLIC PANEL
Anion gap: 8 (ref 5–15)
BUN: 26 mg/dL — ABNORMAL HIGH (ref 6–20)
CO2: 22 mmol/L (ref 22–32)
Calcium: 9.3 mg/dL (ref 8.9–10.3)
Chloride: 110 mmol/L (ref 98–111)
Creatinine, Ser: 1.79 mg/dL — ABNORMAL HIGH (ref 0.44–1.00)
GFR, Estimated: 33 mL/min — ABNORMAL LOW (ref >60)
Glucose, Bld: 110 mg/dL — ABNORMAL HIGH (ref 70–99)
Potassium: 3.7 mmol/L (ref 3.5–5.1)
Sodium: 140 mmol/L (ref 135–145)

## 2020-11-11 LAB — BRAIN NATRIURETIC PEPTIDE: B Natriuretic Peptide: 159.5 pg/mL — ABNORMAL HIGH (ref 0.0–100.0)

## 2020-11-11 NOTE — Progress Notes (Signed)
Heart and Vascular Care Navigation  11/11/2020  Dawn Fowler [birth year] [id number]  Reason for Referral:  CSW consulted to discuss pt current financial concerns.                                                                                                    Assessment:  Pt reports that her mom passed about two years ago and at the same time she quit her job because they were not allowing her time off to be with her mom while she was dying (had terminal cancer).  Since that time the pt has lived off of her disability check and her mothers inheritance but spent most of her money on helping her son with his financial concerns and how that inheritance money is gone and she only has disability income which is not sufficient to pay all of her expenses.    Reports struggling with paying for utilities, storage room costs where she keeps her moms belongings, medications, her phone bill, and just started to struggle to get enough food.  Also states that her rent is going up by over $100/month starting in April so she is concerned abou  Has a strong support system though friends who are helping her with her current outstanding bills.     Pt son had requested pt move down to [facility] where he lives but pt not interested at this time as her son isn't likely to be there for long and she has family and her MDs here in [place].  Would be interested in moving into more affordable housing options but admits to being picky with housing condition and hasn't seen lower income options that she finds appealing.                                  HRT/VAS Care Coordination    Outpatient Care Team Social Worker   Social Worker Name: Tammy Sours- [facility]- [phone]   Living arrangements for the past 2 months Apartment   Lives with: Self   Patient Current Insurance Coverage Managed Medicare   Does Patient Have Prescription Coverage? Yes   Patient Prescription Assistance Programs Other   Other Assistance  Programs Medications applied for Extra Help 11/11/20- pt should qualify for Partial Extra Help which would prevent the $170 premium costs from coming out of her check each month.   Home Assistive Devices/Equipment Oxygen   Current home services DME  Has 02 via [place] and CPAP- Rollator.      Social History:                                                                             SDOH Screenings   Alcohol Screen: Not on file  Depression (PHQ2-9): Not on file  Financial Resource Strain: High Risk  . Difficulty of Paying Living Expenses: Very hard  Food Insecurity: Food Insecurity Present  . Worried About Charity fundraiser in the Last Year: Sometimes true  . Ran Out of Food in the Last Year: Never true  Housing: Low Risk   . Last Housing Risk Score: 0  Physical Activity: Not on file  Social Connections: Not on file  Stress: Not on file  Tobacco Use: Medium Risk  . Smoking Tobacco Use: Former Smoker  . Smokeless Tobacco Use: Never Used  Transportation Needs: No Transportation Needs  . Lack of Transportation (Medical): No  . Lack of Transportation (Non-Medical): No    SDOH Interventions: Financial Resources:   disability is $1,570/month prior to deductions but pays over $1000 a month in rent.  Food Insecurity:  Food Insecurity Interventions: Assist with [employer]  Housing Insecurity:   Worried about added strain when rent increases.  Transportation:    owns her own car so no concerns with transportation but admits she doesn't get out much    Other Care Navigation Interventions:     Inpatient/Outpatient Substance Abuse Counseling/Rehab Options n/a  Provided Pharmacy assistance resources Other- applied for Extra Help   Patient expressed Mental Health concerns Pt feeling very overwhelmed right now.  Struggling with grief still from loss of her mother as well as depression/anxiety with all of her stressors right now.  Patient Referred to: Helping identify counseling  options   Follow-up plan:    CSW emailed food stamp referral to [facility] to help pt apply for food stamp benefits.  Pt expressed concerns with getting pet food for her dog- CSW will help identify community resources to assist with this concern.  CSW helping to find pt in network counseling options to help with depression/anxiety.  Will continue to follow and assist as needed  Jorge Ny, [facility] Desk#: [phone] Cell#: [phone]

## 2020-11-11 NOTE — Telephone Encounter (Signed)
Patient was seen in clinic today and stated that she was having a hard time affording some of her medications.   She preciously had a Pulmonary HTN grant, that has since expired. There are currently no open Pulmonary HTN grants.   Went ahead and started an application for Opsumit and Uptravi assistance. Patient is aware that she will need to bring in POI. Will fax once all signatures and documentation are received.

## 2020-11-11 NOTE — Progress Notes (Signed)
Heart Failure TeleHealth Note  Due to national recommendations of social distancing due to [place] 19, Audio/video telehealth visit is felt to be most appropriate for this patient at this time.  See MyChart message from today for patient consent regarding telehealth for [facility].  Date:  11/11/2020   ID:  Dawn Fowler, DOB [birth year], MRN [id number]  Location: Home  Provider location: [facility] Type of Visit: Established patient   PCP:  Hayden Rasmussen, MD  Cardiologist:  Dr. Aundra Dubin   History of Present Illness: Dawn Fowler is a 55 y.o. female who presents via audio/video conferencing for a telehealth visit today.     she denies symptoms worrisome for COVID 19.   Patient has a long history of severe OSA and suspected OHS/OSA.  She is on CPAP at home and 6L home oxygen (says she has been on oxygen for "years.").  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  She additionally has significant HTN.  She had been on Opsumit 10 + tadalafil 20 mg daily but ran out of this prior to her 2/21 admission. She also has a history of prior DVT after long train ride, has had 2 negative V/Q scans.  She was hospitalized for COVID-19 at [facility] in 1/21.   She was seen by [facility] after discharge from [place] admission and was noted to be volume overloaded, Lasix was changed to torsemide but she was unable to get to pharmacy to pick up. Subsequently, she developed progressive wt gain, nearly 20 lb and increased dyspnea, prompting her to report back to the ED in 2/21, where she was found to be in acute on chronic diastolic HF and readmitted for IV diuresis and AHF consultation. Echo in 2/21 showed LVEF 55-60%, moderate RV dilation/moderate RV dysfunction. [place] 2/21 showed elevated left and right heart filling  pressures with severe pulmonary hypertension. PFTs completed showing severe restriction and severely decreased DLCO. High resolution CT was concerning  for small vessel disease, no ILD, and concerning for PAH.  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 She was continued on IV diuretics and had good response, diuresing 32 lb. She was transitioned to torsemide 40 mg/20 mg. Opsumit and tadalafil restarted.   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  She is on Eliquis. RHC in 9/21 showed normal RA pressure and PCWP, moderate-severe pulmonary arterial hypertension.   She presents today for followup of CHF/RV failure and pulmonary hypertension.  Weight is stable.  She continues to use home oxygen and CPAP.  She has been having trouble getting her medications.  She is off tadalafil.  She is out of nifedipine CR and telmisartan, and BP is high today.  Malvin Johns copay is now too high. Apixaban copay is also now too high.  She feels depressed and has been out of her sertraline.  She has not been exercising.  No lightheadedness.  No chest pain.  She is short of breath walking about 75-100 feet.  She has been eating more and weight is up some at home.    ECG (personally reviewed): NSR, normal  Labs (6/21): K 4.5, creatinine 2.09 Labs (8/21): K 4.3, creatinine 2.11 Labs (  9/21): K 3.9, creatinine 2.0 Labs (10/21): BNP 58 Labs (12/21): K 3.8, creatinine 2.28  6 minute walk (9/21): 168 m 6 minute walk (10/21): 57 m  PMH: 1. OHS/OSA: Uses home oxygen.  2. CKD stage IV 3. COVID-19 PNA in 12/20 4. Restrictive lung disease: PFTs (2/21) with severe restriction, severely decreased DLCO.  - High resolution CT chest (2/21): no interstitial lung disease, "small vessel disease."  5. Gout 6. HTN 7. DVT: Remote, after train ride.  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  8. Pulmonary hypertension: RHC (2/21) with mean RA 14, PA 89/33 mean 54, mean PCWP 34, CI 2.22, PVR 3.87 WU => mixed pulmonary arterial/pulmonary venous hypertension.  - V/Q scan (2/21): No evidence for chronic PE.  - Echo (2/21): EF 55-60%, moderately dilated RV with moderately decreased systolic function.  - HIV and rheumatologic serologies negative.  - RHC (9/21): mean RA 9, PA 68/23 mean 40, mean PCWP 11, PVR 5.6 WU, no step up in oxygen saturation so no evidence for left to right shunting.  9. Depression   Current Outpatient Medications  Medication Sig Dispense Refill  . acetaminophen (TYLENOL) 500 MG tablet Take 1,000 mg by mouth every 6 (six) hours as needed for moderate pain or headache.    . allopurinol (ZYLOPRIM) 100 MG tablet Take 100 mg by mouth daily.    Marland Kitchen apixaban (ELIQUIS) 5 MG TABS tablet Take 1 tablet (5 mg total) by mouth 2 (two) times daily. 60 tablet 3  . Biotin w/ Vitamins C & E (HAIR SKIN & NAILS GUMMIES PO) Take 2 tablets by mouth daily.    . Cholecalciferol (VITAMIN D3) 125 MCG (5000 UT) CAPS Take 1 capsule by mouth daily.    . colchicine 0.6 MG tablet Take 0.6 mg by mouth daily as needed (gout flare).    . diphenhydramine-acetaminophen (TYLENOL PM) 25-500 MG TABS tablet Take 2 tablets by mouth at bedtime as needed (sleep).    . fluticasone furoate-vilanterol (BREO ELLIPTA) 200-25 MCG/INH AEPB Inhale 1 puff into the lungs daily. 60 each 5  . ipratropium-albuterol (DUONEB) 0.5-2.5 (3) MG/3ML SOLN Take 3 mLs by nebulization.    . macitentan (OPSUMIT) 10 MG tablet Take 1 tablet (10 mg total) by mouth daily. 30 tablet 11  . Multiple Vitamins-Minerals (ADULT GUMMY) CHEW Chew 2 tablets by mouth daily.    . OXYGEN Inhale 4-8 L into the lungs continuous.    . pantoprazole (PROTONIX) 40 MG tablet Take 40 mg by mouth daily.    . potassium chloride SA (KLOR-CON) 20 MEQ tablet Take 20 mEq by mouth daily.     Marland Kitchen PREMARIN 0.625 MG tablet Take 0.625 mg by mouth daily.    .  sertraline (ZOLOFT) 25 MG tablet Take 1 tablet (25 mg total) by mouth daily. 30 tablet 2  . spironolactone (ALDACTONE) 25 MG tablet TAKE 1/2 TABLET(12.5 MG) BY MOUTH DAILY 45 tablet 3  . tadalafil (CIALIS) 20 MG tablet Take 20 mg by mouth daily.    . tadalafil, PAH, (ALYQ) 20 MG tablet Take 2 tablets (40 mg total) by mouth daily. 60 tablet 11  . torsemide (DEMADEX) 20 MG tablet Take 2 tablets (40 mg total) by mouth 2 (two) times daily. 120 tablet 3  . traMADol (ULTRAM) 50 MG tablet Take 50 mg by mouth every 6 (six) hours as needed for moderate pain or severe pain.     Marland Kitchen UNABLE TO FIND Med Name: Selexipag 400 mg po twice  daily    . UPTRAVI 200 & 800 MCG TBPK Take by mouth.     No current facility-administered medications for this encounter.    Allergies  Allergen Reactions  . Adhesive [Tape] Hives    Tolerates paper tape  . Succinylcholine Other (See Comments)    Have trouble waking up  . Tizanidine Hives      Social History   Socioeconomic History  . Marital status: Single    Spouse name: Not on file  . Number of children: Not on file  . Years of education: Not on file  . Highest education level: Not on file  Occupational History  . Not on file  Tobacco Use  . Smoking status: Former Research scientist (life sciences)  . Smokeless tobacco: Never Used  . Tobacco comment: only smoked for fun in highschool not even one a day  Vaping Use  . Vaping Use: Never used  Substance and Sexual Activity  . Alcohol use: Yes    Comment: occ  . Drug use: Not Currently  . Sexual activity: Yes    Partners: Male    Comment: 1st intercourse- 2, partners- 30, current partner- 10 yrs   Other Topics Concern  . Not on file  Social History Narrative  . Not on file   Social Determinants of Health   Financial Resource Strain: High Risk  . Difficulty of Paying Living Expenses: Very hard  Food Insecurity: Food Insecurity Present  . Worried About Charity fundraiser in the Last Year: Sometimes true  . Ran Out of Food in  the Last Year: Never true  Transportation Needs: No Transportation Needs  . Lack of Transportation (Medical): No  . Lack of Transportation (Non-Medical): No  Physical Activity: Not on file  Stress: Not on file  Social Connections: Not on file  Intimate Partner Violence: Not on file      Family History  Problem Relation Age of Onset  . COPD Father   . Cancer Mother        gallbladder and bladder   . Hypertension Brother   . Breast cancer Other   . Diabetes Son   . Breast cancer Cousin   . Breast cancer Cousin   . Pulmonary Hypertension Neg Hx     Vitals:   11/11/20 1139  BP: (!) 160/90  Pulse: 79  SpO2: 100%   Wt Readings from Last 3 Encounters:  09/20/20 117.5 kg (259 lb 2 oz)  08/20/20 119.2 kg (262 lb 12.8 oz)  07/12/20 119.3 kg (263 lb)   Exam:   BP (!) 160/90   Pulse 79   LMP  (LMP Unknown) Comment: pmb last 2-3 months  SpO2 100% Comment: 5 l n/c  Exam:  (Video/Tele Health Call; Exam is subjective and or/visual.) General:  Speaks in full sentences. No resp difficulty. Lungs: Normal respiratory effort with conversation.  Abdomen: Non-distended per patient report Extremities: Pt denies edema. Neuro: Alert & oriented x 3.   ASSESSMENT & PLAN:  1. Chronic diastolic CHF with significant RV dysfunction: Echo in 2/21 with LVEF 55-60%, moderate RV dilation/moderate RV dysfunction.  [place] 2/18 showed elevated left and right heart filling pressures with severe pulmonary hypertension.  Suspect mixed pulmonary venous/pulmonary arterial hypertension in setting of significant LV diastolic dysfunction (PVR 3.87 WU). Repeat RHC in 9/21 with moderate-severe PAH, normal filling pressures, and PVR 5.6 WU.  No evidence for left=>right shunting.  NYHA class III symptoms, stable.  - Continue torsemide 40 mg bid, BMET today.  2. Pulmonary  hypertension: Suspect that this is a mixed picture with group 2 and group 3 PH (related to diastolic LV dysfunction and OHS/OSA).  However,  concerned for component of group 1 PAH as well. V/Q scan not suggestive of chronic PEs and high resolution CT chest in 2/21 was not suggestive of ILD. HIV and rheumatologic serologies negative. RHC showed severe mixed pulmonary venous/pulmonary arterial hypertension, repeat RHC after diuresis in 9/21 showed PAH. PFTs completed in 2/21 showed severe restriction, severely decreased DLCO (?restriction due to body habitus). She is on Opsumit and tadalafil (but has been out of tadalafil) given concern for group 1 PH component, she has started [place].    - Continue to maintain oxygen saturation with home O2 and CPAP. Weight loss also would be helpful.  - Continue Opsumit 10 mg daily. - Restart tadalafil 40 mg daily.  - She has not tolerated more than 600 mcg bid of Uptravi.  Will need pharmacist to work with her to get the copay down.     - Check BNP today - Will hold off on 6 minute walk until next visit as she is off so many meds.  3. HTN: BP elevated, she is off nifedipine and telmisartan.    - Restart nifedipine CR 60 mg bid.  - Restart telmisartan 40 daily.  4. CKD stage 3b-4: baseline SCr ~2 - BMET today.  - Followup with nephrology.  5. OHS/OSA: compliant w/ CPAP and home oxygen.  - Needs weight loss. She needs to contact [facility] (referral has been made). She has asked about breast reduction => I think this could potentially help lung restriction but need to make sure she is as optimized from a cardiopulmonary standpoint as possible before considering surgery.  6. 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  As she is at baseline sedentary and has had recurrent DVTs, would continue Eliquis long-term.   7. Depression: Restart sertraline 25 mg daily.   COVID screen The patient does not have any symptoms that suggest any further testing/ screening at this time.  Social distancing reinforced today.  Patient Risk: After full review of this patients clinical status,  I feel that they are at moderate risk for cardiac decompensation at this time.  Relevant cardiac medications were reviewed at length with the patient today. The patient does not have concerns regarding their medications at this time.   Recommended follow-up:  6 wks  Today, I have spent 17 minutes with the patient with telehealth technology discussing the above issues .    Signed, [name], MD  11/11/2020  [facility] [address] [facility] [place] [address] [phone] (office) [phone] (fax)

## 2020-11-11 NOTE — Patient Instructions (Signed)
Please call your insurance company and ask about your deductible  We have given you Eliquis samples and submitted paperwork for Uptravi and Opsumit assistance  Please call [facility] for an appointment  Labs done today, your results will be available in MyChart, we will contact you for abnormal readings.  Your physician recommends that you schedule a follow-up appointment in: 6 weeks  If you have any questions or concerns before your next appointment please send [place] a message through [facility] or call our office at [phone].    TO LEAVE A MESSAGE FOR THE NURSE SELECT OPTION 2, PLEASE LEAVE A MESSAGE INCLUDING: . YOUR NAME . DATE OF BIRTH . CALL BACK NUMBER . REASON FOR CALL**this is important as we prioritize the call backs  [place] AS LONG AS YOU CALL BEFORE 4:00 PM  At the [facility], you and your health needs are our priority. As part of our continuing mission to provide you with exceptional heart care, we have created designated Provider Care Teams. These Care Teams include your primary Cardiologist (physician) and Advanced Practice Providers (APPs- Physician Assistants and Nurse Practitioners) who all work together to provide you with the care you need, when you need it.   You may see any of the following providers on your designated Care Team at your next follow up: Marland Kitchen Dr Glori Bickers . Dr [name] . Darrick Grinder, NP . Lyda Jester, PA . Audry Riles, PharmD   Please be sure to bring in all your medications bottles to every appointment.

## 2020-11-11 NOTE — Progress Notes (Signed)
Patient is being seen in the Advanced Heart Failure Clinic. VS, EKG, and device interrogations performed in the clinic. Dr McLean is at home and seeing patients via telemedicine as he is in quarantine.   

## 2020-11-11 NOTE — Progress Notes (Signed)
Medication Samples have been provided to the patient.  Drug name: Eliquis       Strength: 5 mg        Qty: 2  LOT: [id number]  Exp.Date: 4/23  Dosing instructions: Take 1 tablet Twice daily   The patient has been instructed regarding the correct time, dose, and frequency of taking this medication, including desired effects and most common side effects.   Dawn Fowler 12:31 PM 11/11/2020

## 2020-11-12 ENCOUNTER — Telehealth (HOSPITAL_COMMUNITY): Payer: Self-pay | Admitting: Licensed Clinical Social Worker

## 2020-11-12 NOTE — Telephone Encounter (Signed)
CSW called pt to follow up regarding concerns expressed yesterday during appt.  Unable to reach- left VM requesting return call  Will continue to follow and assist as needed  Jorge Ny, [facility] Desk#: [phone] Cell#: [phone]

## 2020-11-14 ENCOUNTER — Telehealth: Payer: Self-pay | Admitting: Physician Assistant
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
## 2020-11-16 ENCOUNTER — Telehealth (HOSPITAL_COMMUNITY): Payer: Self-pay | Admitting: *Deleted

## 2020-11-16 ENCOUNTER — Telehealth (HOSPITAL_COMMUNITY): Payer: Self-pay | Admitting: Licensed Clinical Social Worker

## 2020-11-16 ENCOUNTER — Telehealth (HOSPITAL_COMMUNITY): Payer: Self-pay | Admitting: Pharmacist

## 2020-11-16 NOTE — Telephone Encounter (Signed)
CSW received phone call from pt requesting help with medication cost concerns with uptravi, opsumit, and tadalafil.  Request has already been sent to Patient Advocate for the same so CSW checked in with Patient Advocate regarding progress.  Unfortunately no immediate solutions to assist with copay costs available at this time- pt applications for manufacturer assistance were started last week but pt needing to bring in proof of income to complete applications for Uptravi and Opsumit.    Pt also requested contact information for the case worker with [facility] assisting with food stamps- had spoken to her last week and was supposed to get back with her but lost her number- contact provided.  CSW also informed pt regarding local pet food drive occurring this week where she can get pet food for her dog.  Pt remains interested in counseling but does not think she could afford copays so CSW provided pt with number to [facility] which offers counseling on sliding scale basis to call and see what kind of assistance she would be eligible for.  Will continue to follow and assist as needed  Jorge Ny, [facility] Desk#: [phone] Cell#: [phone]

## 2020-11-16 NOTE — Telephone Encounter (Signed)
Advanced Heart Failure Patient Advocate Encounter  Prior Authorization for Opsumit has been submitted and approved.    PA# [id number] Effective dates: 11/16/20 through 10/29/21  Advanced Heart Failure Patient Advocate Encounter  Prior Authorization for Dawn Fowler has been approved.    Effective dates: 10/30/20 through 10/29/21

## 2020-11-16 NOTE — Telephone Encounter (Signed)
Patient Advocate Encounter   Received notification from [employer] that prior authorization for Tadalafil is required.   PA submitted on CoverMyMeds Key [id number]  Status is pending   Will continue to follow.
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
## 2020-11-16 NOTE — Telephone Encounter (Signed)
Patient assistance applications for Uptravi and Opsumit completed and sent in for review- fax confirmation received- awaiting determination  Will continue to follow and assist as needed  Jorge Ny, [place] Worker [facility] Desk#: [phone] Cell#: [phone]

## 2020-11-16 NOTE — Telephone Encounter (Signed)
Sent in [employer] application to [employer] for [employer].   Application pending, will continue to follow.   Audry Riles, PharmD, BCPS, BCCP, CPP Heart Failure Clinic Pharmacist [phone]

## 2020-11-17 ENCOUNTER — Telehealth (HOSPITAL_COMMUNITY): Payer: Self-pay | Admitting: Pharmacist

## 2020-11-17 MED ORDER — TADALAFIL (PAH) 20 MG PO TABS
40.0000 mg | ORAL_TABLET | Freq: Every day | ORAL | 11 refills | Status: DC
Start: 2020-11-17 — End: 2021-01-04

## 2020-11-17 NOTE — Telephone Encounter (Signed)
Patient needs to provide POI in order to move on to the next step in the enrollment process. [employer] stated that they were calling her today.  Will follow up.

## 2020-11-17 NOTE — Telephone Encounter (Signed)
Tadalafil prescription sent to [facility]. Patient will use GoodRx.    Audry Riles, PharmD, BCPS, BCCP, CPP Heart Failure Clinic Pharmacist [phone]

## 2020-11-18 NOTE — Telephone Encounter (Signed)
Advanced Heart Failure Patient Advocate Encounter   Patient was approved to receive Uptravi and Opsumit from [place] [facility] under conditional approval. Temporary approval is good through 01/16/2021. She needs to sign Attestation form to continue to receive the medication past that date. Patient is aware and will come to clinic to sign paperwork. Paperwork left at front desk.   Audry Riles, PharmD, BCPS, BCCP, CPP Heart Failure Clinic Pharmacist [phone]

## 2020-11-24 ENCOUNTER — Telehealth: Payer: Self-pay | Admitting: Pulmonary Disease

## 2020-11-24 DIAGNOSIS — J9611 Chronic respiratory failure with hypoxia: Secondary | ICD-10-CM

## 2020-11-24 NOTE — Telephone Encounter (Signed)
Spoke with patient. She is aware that JE has approved the order. Will place the order to [employer]. Nothing further needed.

## 2020-11-24 NOTE — Telephone Encounter (Signed)
OK to prescribe humidifier to patient's DME.  Rodman Pickle, M.D. [facility] Pulmonary/Critical Care Medicine 11/24/2020 2:32 PM

## 2020-11-24 NOTE — Telephone Encounter (Signed)
Called and spoke with pt and she stated that a new rx for the humidifier needs to be sent over to [facility] to go on her home concentrator.  JE please advise. Thanks

## 2020-11-28 ENCOUNTER — Encounter (INDEPENDENT_AMBULATORY_CARE_PROVIDER_SITE_OTHER): Payer: Self-pay

## 2020-11-29 ENCOUNTER — Encounter (HOSPITAL_COMMUNITY): Payer: Self-pay

## 2020-11-30 ENCOUNTER — Telehealth (HOSPITAL_COMMUNITY): Payer: Self-pay | Admitting: Licensed Clinical Social Worker

## 2020-11-30 ENCOUNTER — Telehealth (HOSPITAL_COMMUNITY): Payer: Self-pay | Admitting: *Deleted

## 2020-11-30 NOTE — Telephone Encounter (Signed)
Pt left VM requesting a call from the social worker that helped her last time she was in office. I forwarded the message to [employer].

## 2020-11-30 NOTE — Telephone Encounter (Signed)
CSW received call from pt informing that she is out of Eliquis and won't have money to pay for more till the end the week.  CSW placed 1 box samples at front desk for pt to hold her over until she is able to get more.  Pt also inquired about form that was left at front desk for her.  CSW able to see from PharmD note that attestation form was left for her to sign- CSW reiterated need to sign this and attached it to pt samples- she will plan to sign and return to clinic ASAP.  Will continue to follow and assist as needed  Jorge Ny, [place] Desk#: [phone] Cell#: [phone]

## 2020-12-02 ENCOUNTER — Telehealth (HOSPITAL_COMMUNITY): Payer: Self-pay | Admitting: Licensed Clinical Social Worker

## 2020-12-02 NOTE — Telephone Encounter (Signed)
CSW received call from pt stating that she called pharmacy to get eliquis but was told it was going to be around $140 to fill. Pt had previously seen on her app that it would be $13 so thinks there is a problem.  CSW told pt to call insurance to inquire what cost of medication should be.  Told her to also ask if eliquis would be eligible for a tier exception to help [place] reduce the cost.  Also mentioned that we could apply for BMS but that she would have to meet out of pocket requirement to get medication for free so it would not fix the immediate problem.  Pt picked up one week of samples from clinic yesterday so has medications currently while we assess options.  Pt then asked question about nifedipine which is newly prescribed mediation from her PCP- states it is causing her to pee overnight and made it hard for her to sleep so she stopped the medication.  CSW informed pt she needed to call her PCP to see about changing mediation or adjusting how she take so it won't cause this issue.  During conversation CSW had asked about torsemide since this is only fluid pill she is taking in the evenings and patient revealed she is out of them and hasn't been taking them.    Due to patient confusion regarding what medications she is taking CSW got MD approval for paramedicine referral.  CSW will speak with pt tomorrow regarding program to see if she is agreeable.  Will continue to follow and assist as needed  Jorge Ny, [place] [facility] Desk#: [phone] Cell#: [phone]

## 2020-12-03 ENCOUNTER — Other Ambulatory Visit (HOSPITAL_COMMUNITY): Payer: Self-pay

## 2020-12-03 ENCOUNTER — Telehealth (HOSPITAL_COMMUNITY): Payer: Self-pay | Admitting: Licensed Clinical Social Worker

## 2020-12-03 MED ORDER — SERTRALINE HCL 25 MG PO TABS: 25.0000 mg | ORAL_TABLET | Freq: Every day | ORAL | 2 refills | Status: DC

## 2020-12-03 NOTE — Telephone Encounter (Signed)
Paramedicine Initial Assessment:  Housing:  In what kind of housing do you live? House/apt/trailer/shelter? apt  Do you rent/pay a mortgage/own? rent  Do you live with anyone? no  Are you currently worried about losing your housing? Is concerned about high rental cost but no immediate concerns  Within the past 12 months have you ever stayed outside, in a car, tent, a shelter, or temporarily with someone? no  Within the past 12 months have you been unable to get utilities when it was really needed?no  Has been behind on bills but has been able to find friends to help with payments.  Social:  What is your current marital status? single  Do you have any children? son  Do you have family or friends who live locally? Son lives in [place] but aunt lives in [place]- both are good sources of support.  Food:  Within the past 12 months were you ever worried that food would run out before you got money to buy more? yes  Within the past 38months have you run out of food and didn't have money to buy more? no  Food stamp application pending with [place]:  What is your current source of income?  [employer]- $1400  How hard is it for you to pay for the basics like food housing, medical care, and utilities? Very hard  Do you have outstanding medical bills? Yes- not high enough to refer for hardship program  Insurance:  Are you currently insured? yes  Do you have prescription coverage? yes  If no insurance, have you applied for coverage (Medicaid, disability, marketplace etc)? n/a  Transportation:  Do you have transportation to your medical appointments? Yes- drives herself  In the past 12 months has lack of transportation kept you from medical appts or from getting medications? no  In the past 12 months has lack of transportation kept you from meetings, work, or getting things you needed? no   Daily Health Needs: Do you have a working scale at home? Yes but not great about  weighing herself  How do you manage your medications at home? Uses a pillbox  Do you ever take your medications differently than prescribed? OTC medications like tylenol she doesn't take as prescribed always.  Pt also hasn't been taking some meds as prescribed as she hasn't been on torsemide for indeterminate amount of time.  Do you have issues affording your medications? yes  If yes, has this ever prevented you from obtaining medications? yes  Do you have any concerns with mobility at home? Gets winded easily so has to stop a lot but can complete ADLS independently  Do you use any assistive devices at home or have PCS at home? Oxygen, have cane and walker but only use them with gout flare up- uses a rolator when out in public  Do you have a PCP? Horald Pollen  Do you have any trouble reading or writing? no  Are there any additional barriers you see to getting the care you need? Broke prescription reading glasses and has been unable to get more- CSW directed pt to call opthalmologist to see what insurance would cover for glasses- encouraged her to call us back if they won't cover a new pair.  CSW will continue to follow through paramedicine program and assist as needed.  Jorge Ny, LCSW Clinical Social Worker Advanced Heart Failure Clinic Desk#: [phone] Cell#: [phone]

## 2020-12-03 NOTE — Telephone Encounter (Signed)
Patient's current 30 day co-pay is $140 for Eliquis. It looks like she would meet her deductible after this refill, her next co-pay should be $47.  Called and spoke with the patient, provided 3 weeks of samples for her. I explained to her that her deductible will not go away, that she would need to fill this one time at $140 in order to get the co-pay to come down. Also explained that she would need to spend 3% OOP on RXs in order to qualify for BMS and this would help her get closer to that total.   LOT [id number] EXP 4/24  [name], CPhT

## 2020-12-09 ENCOUNTER — Telehealth (HOSPITAL_COMMUNITY): Payer: Self-pay

## 2020-12-09 NOTE — Telephone Encounter (Signed)
I reached out to Ms Knoll today to see how she is feeling. She stated she was doing well and was not in need of any assistance at this time. I advised that she would be seen by Marylouise Stacks next week and asked if she had any preferences on day or time. She did not have any days that were best but said any time after 11:00 would be good for her. I will relay this information to Joellen Jersey and have her reach out next week to firm up an appointment. I advised Ms Sleeth that is she should need anything before next week, she could reach out to me at [phone] and she was agreeable.   Jacquiline Doe, EMT 12/09/20

## 2020-12-13 ENCOUNTER — Telehealth (HOSPITAL_COMMUNITY): Payer: Self-pay

## 2020-12-13 NOTE — Telephone Encounter (Signed)
Contacted pt regarding home visit this week, no answer but I did LVM for her to return to call to schedule.   Marylouise Stacks, EMT-Paramedic  12/13/20

## 2020-12-15 NOTE — Telephone Encounter (Signed)
Faxed in Medicare Attestation form via fax.

## 2020-12-16 ENCOUNTER — Other Ambulatory Visit (HOSPITAL_COMMUNITY): Payer: Self-pay

## 2020-12-16 ENCOUNTER — Telehealth (HOSPITAL_COMMUNITY): Payer: Self-pay | Admitting: Pharmacy Technician

## 2020-12-16 ENCOUNTER — Telehealth (HOSPITAL_COMMUNITY): Payer: Self-pay | Admitting: Licensed Clinical Social Worker

## 2020-12-16 ENCOUNTER — Other Ambulatory Visit (HOSPITAL_COMMUNITY): Payer: Self-pay | Admitting: *Deleted

## 2020-12-16 DIAGNOSIS — I5032 Chronic diastolic (congestive) heart failure: Secondary | ICD-10-CM

## 2020-12-16 DIAGNOSIS — I272 Pulmonary hypertension, unspecified: Secondary | ICD-10-CM

## 2020-12-16 NOTE — Telephone Encounter (Signed)
Advanced Heart Failure Patient Advocate Encounter   Patient was approved to receive Opsumit and Uptravi from [employer]  Patient ID: [id number] Effective dates: through 10/29/21  [name], CPhT

## 2020-12-16 NOTE — Telephone Encounter (Signed)
Community Paramedic contacted CSW to request help with pt inability to afford medication at this time.  Can't afford Eliquis copay of $140 without not paying her electric bill.  After paying for her eliquis this one time copay should come down to $47 which would be more affordable.  CSW paid for $124 electric bill through Patient Care fund to allow patient to pay for medication and reach deductible.  CSW will provide BMS application to paramedic to have pt fill out so we can see how much pt out of pocket requirement will be to receive Eliquis through BMS.  Will continue to follow and assist as needed  Jorge Ny, [facility] Desk#: [phone] Cell#: [phone]

## 2020-12-16 NOTE — Progress Notes (Signed)
Paramedicine Encounter    Patient ID: Dawn Fowler, female    DOB: [birth year], 55 y.o.   MRN: [id number]   Patient Care Team: Hayden Rasmussen, MD as PCP - General (Family Medicine) Larey Dresser, MD as PCP - Advanced Heart Failure (Cardiology)  Patient Active Problem List   Diagnosis Date Noted  . Chronic diastolic (congestive) heart failure ([place]) 06/10/2020  . Restrictive lung disease 05/11/2020  . RVF (right ventricular failure) ([place])   . Exertional dyspnea   . Chronic respiratory failure with hypoxia ([place]) 11/20/2019  . OSA on CPAP 11/20/2019  . Complex care coordination 11/20/2019  . Morbid obesity with BMI of 45.0-49.9, adult ([place]) 11/20/2019  . Pulmonary hypertension ([place]) 10/30/2019  . Gout 10/30/2019  . CKD (chronic kidney disease) stage 3, GFR 30-59 ml/min (HCC) 10/30/2019    Current Outpatient Medications:  .  acetaminophen (TYLENOL) 500 MG tablet, Take 1,000 mg by mouth every 6 (six) hours as needed for moderate pain or headache., Disp: , Rfl:  .  allopurinol (ZYLOPRIM) 100 MG tablet, Take 100 mg by mouth daily., Disp: , Rfl:  .  apixaban (ELIQUIS) 5 MG TABS tablet, Take 1 tablet (5 mg total) by mouth 2 (two) times daily., Disp: 60 tablet, Rfl: 3 .  Biotin w/ Vitamins C & E (HAIR SKIN & NAILS GUMMIES PO), Take 2 tablets by mouth daily., Disp: , Rfl:  .  Cholecalciferol (VITAMIN D3) 125 MCG (5000 UT) CAPS, Take 1 capsule by mouth daily., Disp: , Rfl:  .  colchicine 0.6 MG tablet, Take 0.6 mg by mouth daily as needed (gout flare)., Disp: , Rfl:  .  diphenhydramine-acetaminophen (TYLENOL PM) 25-500 MG TABS tablet, Take 2 tablets by mouth at bedtime as needed (sleep)., Disp: , Rfl:  .  fluticasone furoate-vilanterol (BREO ELLIPTA) 200-25 MCG/INH AEPB, Inhale 1 puff into the lungs daily., Disp: 60 each, Rfl: 5 .  gabapentin (NEURONTIN) 100 MG capsule, Take 300 mg by mouth at bedtime., Disp: , Rfl:  .  ipratropium-albuterol (DUONEB) 0.5-2.5 (3) MG/3ML SOLN, Take 3 mLs by  nebulization., Disp: , Rfl:  .  macitentan (OPSUMIT) 10 MG tablet, Take 1 tablet (10 mg total) by mouth daily., Disp: 30 tablet, Rfl: 11 .  Multiple Vitamins-Minerals (ADULT GUMMY) CHEW, Chew 2 tablets by mouth daily., Disp: , Rfl:  .  OXYGEN, Inhale 4-8 L into the lungs continuous., Disp: , Rfl:  .  pantoprazole (PROTONIX) 40 MG tablet, Take 40 mg by mouth daily., Disp: , Rfl:  .  potassium chloride SA (KLOR-CON) 20 MEQ tablet, Take 20 mEq by mouth daily. , Disp: , Rfl:  .  PREMARIN 0.625 MG tablet, Take 0.625 mg by mouth daily., Disp: , Rfl:  .  Selexipag (UPTRAVI) 600 MCG TABS, Take by mouth 2 (two) times daily., Disp: , Rfl:  .  sertraline (ZOLOFT) 25 MG tablet, Take 1 tablet (25 mg total) by mouth daily., Disp: 30 tablet, Rfl: 2 .  spironolactone (ALDACTONE) 25 MG tablet, TAKE 1/2 TABLET(12.5 MG) BY MOUTH DAILY, Disp: 45 tablet, Rfl: 3 .  torsemide (DEMADEX) 20 MG tablet, Take 2 tablets (40 mg total) by mouth 2 (two) times daily., Disp: 120 tablet, Rfl: 3 .  traMADol (ULTRAM) 50 MG tablet, Take 50 mg by mouth every 6 (six) hours as needed for moderate pain or severe pain. , Disp: , Rfl:  .  tadalafil, PAH, (ALYQ) 20 MG tablet, Take 2 tablets (40 mg total) by mouth daily. (Patient not taking: Reported on 12/16/2020), Disp:  60 tablet, Rfl: 11 .  UNABLE TO FIND, Med Name: Selexipag 400 mg po twice daily (Patient not taking: Reported on 12/16/2020), Disp: , Rfl:  .  UPTRAVI 200 & 800 MCG TBPK, Take by mouth. (Patient not taking: Reported on 12/16/2020), Disp: , Rfl:  Allergies  Allergen Reactions  . Adhesive [Tape] Hives    Tolerates paper tape  . Succinylcholine Other (See Comments)    Have trouble waking up  . Tizanidine Hives      Social History   Socioeconomic History  . Marital status: Single    Spouse name: Not on file  . Number of children: Not on file  . Years of education: Not on file  . Highest education level: Not on file  Occupational History  . Not on file  Tobacco Use   . Smoking status: Former Research scientist (life sciences)  . Smokeless tobacco: Never Used  . Tobacco comment: only smoked for fun in highschool not even one a day  Vaping Use  . Vaping Use: Never used  Substance and Sexual Activity  . Alcohol use: Yes    Comment: occ  . Drug use: Not Currently  . Sexual activity: Yes    Partners: Male    Comment: 1st intercourse- 16, partners- 26, current partner- 10 yrs   Other Topics Concern  . Not on file  Social History Narrative  . Not on file   Social Determinants of Health   Financial Resource Strain: High Risk  . Difficulty of Paying Living Expenses: Very hard  Food Insecurity: Food Insecurity Present  . Worried About Charity fundraiser in the Last Year: Sometimes true  . Ran Out of Food in the Last Year: Never true  Transportation Needs: No Transportation Needs  . Lack of Transportation (Medical): No  . Lack of Transportation (Non-Medical): No  Physical Activity: Not on file  Stress: Not on file  Social Connections: Not on file  Intimate Partner Violence: Not on file    Physical Exam      Future Appointments  Date Time Provider [place]  01/04/2021  8:40 AM Larey Dresser, MD [facility] None  04/28/2021 11:30 AM Princess Bruins, MD [facility] None    BP (!) 134/98   Pulse 87   Resp 18   Wt 250 lb (113.4 kg)   LMP  (LMP Unknown) Comment: pmb last 2-3 months  SpO2 95%   BMI 47.24 kg/m   Weight yesterday-251 Last visit weight-259--08/2020  First visit with pt. She lives alone in apt. She does have family here in [place]. She drives self to car. [facility] to [facility]. Right now she is unable to afford the co-pay of her next eliquis refill. Its $146-the pharmacy told her after she meets that deductible then it would go down to the $42 for refill after that. She states she will have to forego paying a bill to be able to get the eliquis.  She also just got approved for food stamps.   meds reviewed--she is out of premarin, out of her tadalafil    She has been taking torsemide 40mg  daily, not BID. Her rx says daily so she has been following that.  She does get sob upon exertion, cant stand for long periods of time without getting sob.  She denies c/p, sometimes gets dizzy.  Her b/p is elevated, she does have a slight h/a.  She reports taking her meds this morning.  She reports she tries to limit her salt intake, she does not  monitor her fluid intake daily. She does drink a lot during the day.  I think she will benefit for the health coach program as well as the prep program. She was not able to do the weight loss as it was $50 each visit and she could not afford that. No edema noted.  --confirmed she needs to take torsemide 40mg  BID.   Marylouise Stacks, [facility] Paramedic  12/16/20

## 2020-12-17 ENCOUNTER — Telehealth: Payer: Self-pay

## 2020-12-17 NOTE — Telephone Encounter (Signed)
Call to patient reference referral to PREP Requested call back to discuss

## 2020-12-20 ENCOUNTER — Telehealth: Payer: Self-pay

## 2020-12-20 NOTE — Telephone Encounter (Signed)
Returned Cabin crew. Is interested. Explained next class start T/TH 230p-345p 01/04/21 Agreeable to starting then.  Will schedule intake closer to start of class

## 2020-12-27 ENCOUNTER — Telehealth (HOSPITAL_COMMUNITY): Payer: Self-pay | Admitting: Licensed Clinical Social Worker

## 2020-12-27 NOTE — Telephone Encounter (Signed)
Pt reports she received approval letter for Extra Help program.  Pt sent CSW verification of this letter- appears to just be approved for Partial Extra help which will reduce monthly premiums, reduce annual deductible, and put a cap on copayment amounts for medications.  CSW will continue to follow and assist as needed  Jorge Ny, [place] Desk#: [phone] Cell#: [phone]

## 2020-12-30 ENCOUNTER — Other Ambulatory Visit (HOSPITAL_COMMUNITY): Payer: Self-pay

## 2020-12-30 NOTE — Progress Notes (Signed)
Paramedicine Encounter    Patient ID: Dawn Fowler, female    DOB: 03-26-1966, 55 y.o.   MRN: [id number]   Patient Care Team: Hayden Rasmussen, MD as PCP - General (Family Medicine) Larey Dresser, MD as PCP - Advanced Heart Failure (Cardiology)  Patient Active Problem List   Diagnosis Date Noted  . Chronic diastolic (congestive) heart failure ([place]) 06/10/2020  . Restrictive lung disease 05/11/2020  . RVF (right ventricular failure) ([place])   . Exertional dyspnea   . Chronic respiratory failure with hypoxia ([place]) 11/20/2019  . OSA on CPAP 11/20/2019  . Complex care coordination 11/20/2019  . Morbid obesity with BMI of 45.0-49.9, adult ([place]) 11/20/2019  . Pulmonary hypertension ([place]) 10/30/2019  . Gout 10/30/2019  . CKD (chronic kidney disease) stage 3, GFR 30-59 ml/min (HCC) 10/30/2019    Current Outpatient Medications:  .  acetaminophen (TYLENOL) 500 MG tablet, Take 1,000 mg by mouth every 6 (six) hours as needed for moderate pain or headache., Disp: , Rfl:  .  allopurinol (ZYLOPRIM) 100 MG tablet, Take 100 mg by mouth daily., Disp: , Rfl:  .  apixaban (ELIQUIS) 5 MG TABS tablet, Take 1 tablet (5 mg total) by mouth 2 (two) times daily., Disp: 60 tablet, Rfl: 3 .  Biotin w/ Vitamins C & E (HAIR SKIN & NAILS GUMMIES PO), Take 2 tablets by mouth daily., Disp: , Rfl:  .  Cholecalciferol (VITAMIN D3) 125 MCG (5000 UT) CAPS, Take 1 capsule by mouth daily., Disp: , Rfl:  .  colchicine 0.6 MG tablet, Take 0.6 mg by mouth daily as needed (gout flare)., Disp: , Rfl:  .  diphenhydramine-acetaminophen (TYLENOL PM) 25-500 MG TABS tablet, Take 2 tablets by mouth at bedtime as needed (sleep)., Disp: , Rfl:  .  fluticasone furoate-vilanterol (BREO ELLIPTA) 200-25 MCG/INH AEPB, Inhale 1 puff into the lungs daily., Disp: 60 each, Rfl: 5 .  gabapentin (NEURONTIN) 100 MG capsule, Take 300 mg by mouth at bedtime., Disp: , Rfl:  .  ipratropium-albuterol (DUONEB) 0.5-2.5 (3) MG/3ML SOLN, Take 3 mLs by  nebulization., Disp: , Rfl:  .  macitentan (OPSUMIT) 10 MG tablet, Take 1 tablet (10 mg total) by mouth daily., Disp: 30 tablet, Rfl: 11 .  Multiple Vitamins-Minerals (ADULT GUMMY) CHEW, Chew 2 tablets by mouth daily., Disp: , Rfl:  .  OXYGEN, Inhale 4-8 L into the lungs continuous., Disp: , Rfl:  .  pantoprazole (PROTONIX) 40 MG tablet, Take 40 mg by mouth daily., Disp: , Rfl:  .  potassium chloride SA (KLOR-CON) 20 MEQ tablet, Take 20 mEq by mouth daily. , Disp: , Rfl:  .  Selexipag (UPTRAVI) 600 MCG TABS, Take by mouth 2 (two) times daily., Disp: , Rfl:  .  sertraline (ZOLOFT) 25 MG tablet, Take 1 tablet (25 mg total) by mouth daily., Disp: 30 tablet, Rfl: 2 .  spironolactone (ALDACTONE) 25 MG tablet, TAKE 1/2 TABLET(12.5 MG) BY MOUTH DAILY, Disp: 45 tablet, Rfl: 3 .  torsemide (DEMADEX) 20 MG tablet, Take 2 tablets (40 mg total) by mouth 2 (two) times daily. (Patient taking differently: Take 40 mg by mouth daily.), Disp: 120 tablet, Rfl: 3 .  traMADol (ULTRAM) 50 MG tablet, Take 50 mg by mouth every 6 (six) hours as needed for moderate pain or severe pain. , Disp: , Rfl:  .  PREMARIN 0.625 MG tablet, Take 0.625 mg by mouth daily. (Patient not taking: Reported on 12/30/2020), Disp: , Rfl:  .  tadalafil, PAH, (ALYQ) 20 MG tablet, Take  2 tablets (40 mg total) by mouth daily. (Patient not taking: No sig reported), Disp: 60 tablet, Rfl: 11 .  UNABLE TO FIND, Med Name: Selexipag 400 mg po twice daily (Patient not taking: No sig reported), Disp: , Rfl:  .  UPTRAVI 200 & 800 MCG TBPK, Take by mouth. (Patient not taking: No sig reported), Disp: , Rfl:  Allergies  Allergen Reactions  . Adhesive [Tape] Hives    Tolerates paper tape  . Succinylcholine Other (See Comments)    Have trouble waking up  . Tizanidine Hives      Social History   Socioeconomic History  . Marital status: Single    Spouse name: Not on file  . Number of children: Not on file  . Years of education: Not on file  . Highest  education level: Not on file  Occupational History  . Not on file  Tobacco Use  . Smoking status: Former Research scientist (life sciences)  . Smokeless tobacco: Never Used  . Tobacco comment: only smoked for fun in highschool not even one a day  Vaping Use  . Vaping Use: Never used  Substance and Sexual Activity  . Alcohol use: Yes    Comment: occ  . Drug use: Not Currently  . Sexual activity: Yes    Partners: Male    Comment: 1st intercourse- 17, partners- 36, current partner- 10 yrs   Other Topics Concern  . Not on file  Social History Narrative  . Not on file   Social Determinants of Health   Financial Resource Strain: High Risk  . Difficulty of Paying Living Expenses: Very hard  Food Insecurity: Food Insecurity Present  . Worried About Charity fundraiser in the Last Year: Sometimes true  . Ran Out of Food in the Last Year: Never true  Transportation Needs: No Transportation Needs  . Lack of Transportation (Medical): No  . Lack of Transportation (Non-Medical): No  Physical Activity: Not on file  Stress: Not on file  Social Connections: Not on file  Intimate Partner Violence: Not on file    Physical Exam      Future Appointments  Date Time Provider [place]  01/04/2021  8:40 AM Larey Dresser, MD [facility] None  04/28/2021 11:30 AM Princess Bruins, MD [facility] None    BP 138/84   Pulse 76   Resp 18   Wt 250 lb (113.4 kg)   LMP  (LMP Unknown) Comment: pmb last 2-3 months  SpO2 93%   BMI 47.24 kg/m   Weight yesterday-250 Last visit weight-250  Pt reports that she is doing good.  She was seen at her kidney doc at [facility] on 2/21 and he wanted to cut back on her torsemide to 40mg  daily.  She has UTI as well but have not gotten that rx picked up yet-she is planning on getting it from pharmacy in the next 1-2 days. She had reported she has lower back that has moved to the left side-decreased urine output with strong odor-so I advised get to get the antibiotic ASAP.    meds reviewed- She does not have the tadalafil-she reports she tried the goodrx card and it was suppose to be $20 but pt reports said they wouldn't accept it. I called pharmacy and they can use a store card discount but it would bring price to $43 and she cant afford that right now.   -she goes to PCP soon- -signed the eliquis med assist form-will get that turned in `  Marylouise Stacks, [facility] Paramedic  12/30/20

## 2020-12-31 ENCOUNTER — Telehealth: Payer: Self-pay

## 2020-12-31 NOTE — Telephone Encounter (Signed)
Call placed to pt reference start of next PREP class on 01/10/21 MW 230p-345pm.  Can do that schedule and start date.  Intake scheduled for 01/03/21 at 3pm at [facility].
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
## 2021-01-04 ENCOUNTER — Ambulatory Visit (HOSPITAL_COMMUNITY)
Admission: RE | Admit: 2021-01-04 | Discharge: 2021-01-04 | Disposition: A | Discharge status: Home / Self Care | Payer: Medicare Other | Source: Ambulatory Visit | Attending: Cardiology | Admitting: Cardiology

## 2021-01-04 ENCOUNTER — Other Ambulatory Visit: Payer: Self-pay

## 2021-01-04 ENCOUNTER — Telehealth (HOSPITAL_COMMUNITY): Payer: Self-pay | Admitting: Licensed Clinical Social Worker

## 2021-01-04 ENCOUNTER — Other Ambulatory Visit (HOSPITAL_COMMUNITY): Payer: Self-pay

## 2021-01-04 VITALS — BP 120/78 | HR 97 | Wt 252.8 lb

## 2021-01-04 DIAGNOSIS — Z833 Family history of diabetes mellitus: Secondary | ICD-10-CM | POA: Insufficient documentation

## 2021-01-04 DIAGNOSIS — Z7901 Long term (current) use of anticoagulants: Secondary | ICD-10-CM | POA: Diagnosis not present

## 2021-01-04 DIAGNOSIS — Z9981 Dependence on supplemental oxygen: Secondary | ICD-10-CM | POA: Diagnosis not present

## 2021-01-04 DIAGNOSIS — Z596 Low income: Secondary | ICD-10-CM | POA: Diagnosis not present

## 2021-01-04 DIAGNOSIS — Z7951 Long term (current) use of inhaled steroids: Secondary | ICD-10-CM | POA: Diagnosis not present

## 2021-01-04 DIAGNOSIS — Z79899 Other long term (current) drug therapy: Secondary | ICD-10-CM | POA: Diagnosis not present

## 2021-01-04 DIAGNOSIS — Z8249 Family history of ischemic heart disease and other diseases of the circulatory system: Secondary | ICD-10-CM | POA: Diagnosis not present

## 2021-01-04 DIAGNOSIS — I5032 Chronic diastolic (congestive) heart failure: Secondary | ICD-10-CM | POA: Insufficient documentation

## 2021-01-04 DIAGNOSIS — I272 Pulmonary hypertension, unspecified: Secondary | ICD-10-CM | POA: Diagnosis not present

## 2021-01-04 DIAGNOSIS — G4733 Obstructive sleep apnea (adult) (pediatric): Secondary | ICD-10-CM | POA: Insufficient documentation

## 2021-01-04 DIAGNOSIS — Z5941 Food insecurity: Secondary | ICD-10-CM | POA: Insufficient documentation

## 2021-01-04 DIAGNOSIS — Z888 Allergy status to other drugs, medicaments and biological substances status: Secondary | ICD-10-CM | POA: Insufficient documentation

## 2021-01-04 DIAGNOSIS — Z86718 Personal history of other venous thrombosis and embolism: Secondary | ICD-10-CM | POA: Insufficient documentation

## 2021-01-04 DIAGNOSIS — Z87891 Personal history of nicotine dependence: Secondary | ICD-10-CM | POA: Insufficient documentation

## 2021-01-04 DIAGNOSIS — N1832 Chronic kidney disease, stage 3b: Secondary | ICD-10-CM | POA: Insufficient documentation

## 2021-01-04 DIAGNOSIS — I130 Hypertensive heart and chronic kidney disease with heart failure and stage 1 through stage 4 chronic kidney disease, or unspecified chronic kidney disease: Secondary | ICD-10-CM | POA: Insufficient documentation

## 2021-01-04 DIAGNOSIS — F32A Depression, unspecified: Secondary | ICD-10-CM | POA: Insufficient documentation

## 2021-01-04 DIAGNOSIS — Z8616 Personal history of COVID-19: Secondary | ICD-10-CM | POA: Diagnosis not present

## 2021-01-04 DIAGNOSIS — I2721 Secondary pulmonary arterial hypertension: Secondary | ICD-10-CM | POA: Diagnosis not present

## 2021-01-04 LAB — BASIC METABOLIC PANEL
Anion gap: 12 (ref 5–15)
BUN: 35 mg/dL — ABNORMAL HIGH (ref 6–20)
CO2: 19 mmol/L — ABNORMAL LOW (ref 22–32)
Calcium: 9.8 mg/dL (ref 8.9–10.3)
Chloride: 104 mmol/L (ref 98–111)
Creatinine, Ser: 2.33 mg/dL — ABNORMAL HIGH (ref 0.44–1.00)
GFR, Estimated: 24 mL/min — ABNORMAL LOW (ref >60)
Glucose, Bld: 101 mg/dL — ABNORMAL HIGH (ref 70–99)
Potassium: 3.9 mmol/L (ref 3.5–5.1)
Sodium: 135 mmol/L (ref 135–145)

## 2021-01-04 LAB — BRAIN NATRIURETIC PEPTIDE: B Natriuretic Peptide: 15.7 pg/mL (ref 0.0–100.0)

## 2021-01-04 MED ORDER — TADALAFIL (PAH) 20 MG PO TABS
40.0000 mg | ORAL_TABLET | Freq: Every day | ORAL | 11 refills | Status: DC
Start: 2021-01-04 — End: 2021-01-06

## 2021-01-04 NOTE — Patient Instructions (Addendum)
6 minute walk done today.  Labs done today. We will contact you only if your labs are abnormal.  RESTART Tadalafil 40mg  (2 tablets) by mouth daily.   No other medication changes were made. Please continue all current medications as prescribed.  Your physician recommends that you schedule a follow-up appointment in: 2 months with an echo prior to your exam  Your physician has requested that you have an echocardiogram. Echocardiography is a painless test that uses sound waves to create images of your heart. It provides your doctor with information about the size and shape of your heart and how well your heart's chambers and valves are working. This procedure takes approximately one hour. There are no restrictions for this procedure.   If you have any questions or concerns before your next appointment please send [place] a message through [place] or call our office at [phone].    TO LEAVE A MESSAGE FOR THE NURSE SELECT OPTION 2, PLEASE LEAVE A MESSAGE INCLUDING: . YOUR NAME . DATE OF BIRTH . CALL BACK NUMBER . REASON FOR CALL**this is important as we prioritize the call backs  YOU WILL RECEIVE A CALL BACK THE SAME DAY AS LONG AS YOU CALL BEFORE 4:00 PM   Do the following things EVERYDAY: 1) Weigh yourself in the morning before breakfast. Write it down and keep it in a log. 2) Take your medicines as prescribed 3) Eat low salt foods--Limit salt (sodium) to 2000 mg per day.  4) Stay as active as you can everyday 5) Limit all fluids for the day to less than 2 liters   At the [facility], you and your health needs are our priority. As part of our continuing mission to provide you with exceptional heart care, we have created designated Provider Care Teams. These Care Teams include your primary Cardiologist (physician) and Advanced Practice Providers (APPs- Physician Assistants and Nurse Practitioners) who all work together to provide you with the care you need, when you need  it.   You may see any of the following providers on your designated Care Team at your next follow up: Marland Kitchen Dr Glori Bickers . Dr [name] . Darrick Grinder, NP . Lyda Jester, PA . Audry Riles, PharmD   Please be sure to bring in all your medications bottles to every appointment.

## 2021-01-04 NOTE — Progress Notes (Signed)
Paramedicine Encounter   Patient ID: Dawn Fowler , female,   DOB: [birth year],55 y.o.,  MRN: [id number]   Met patient in clinic today with provider.  Weight @ clinic-252 B/p-120/78 p-97 [id number]  Needs to get her back onTadalafil-28m  Per her insurance it would be $36.60 through that.  Lauren is going to put her on the PAN foundation to open for that option.   KMarylouise Stacks [place]3/05/2021

## 2021-01-04 NOTE — Telephone Encounter (Signed)
Paramedic returned pt signed [employer] application for Eliquis assistance- completed application faxed in to [place] for review- fax confirmation received- awaiting determination  Pt also called CSW to request help having form completed for her apartment site certifying her dog as an emotional support dog- MD signed and CSW returned to pt and apt site via email  Will continue to follow and assist as needed  Jorge Ny, [facility] Desk#: [phone] Cell#: [phone]

## 2021-01-04 NOTE — Progress Notes (Signed)
6 Min Walk Test Completed  Pt ambulated 106.6 meters O2 Sat ranged 91-100% on 8L oxygen HR ranged 91-125 Pt only able to complete 3 minutes,stopped due to fatigue

## 2021-01-04 NOTE — Progress Notes (Signed)
ID:  Dawn Fowler, DOB Mar 09, 1966, MRN [id number]   Provider location: [facility] Type of Visit: Established patient   PCP:  Hayden Rasmussen, MD  Cardiologist:  Dr. Aundra Dubin   History of Present Illness: Dawn Fowler is a 55 y.o. 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  She is on CPAP at home and 6L home oxygen (says she has been on oxygen for "years.").  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  She additionally has significant HTN.  She had been on Opsumit 10 + tadalafil 20 mg daily but ran out of this prior to her 2/21 admission. She also has a history of prior DVT after long train ride, has had 2 negative V/Q scans.  She was hospitalized for COVID-19 at [facility] in 1/21.   She was seen by [facility] after discharge from [place] admission and was noted to be volume overloaded, Lasix was changed to torsemide but she was unable to get to pharmacy to pick up. Subsequently, she developed progressive wt gain, nearly 20 lb and increased dyspnea, prompting her to report back to the ED in 2/21, where she was found to be in acute on chronic diastolic HF and readmitted for IV diuresis and AHF consultation. Echo in 2/21 showed LVEF 55-60%, moderate RV dilation/moderate RV dysfunction. [place] 2/21 showed elevated left and right heart filling pressures with severe pulmonary hypertension. PFTs completed showing severe restriction and severely decreased DLCO. High resolution CT was concerning  for small vessel disease, no ILD, and concerning for PAH.  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 She was continued on IV diuretics and had good response, diuresing 32 lb. She was transitioned  to torsemide 40 mg/20 mg. Opsumit and tadalafil restarted.   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  She is on Eliquis. RHC in 9/21 showed normal RA pressure and PCWP, moderate-severe pulmonary arterial hypertension.   She presents today for followup of CHF/RV failure and pulmonary hypertension. She continues to use home oxygen and CPAP.  She has been having trouble getting her medications.  She is still off tadalafil.  She is off nifedipine CR and telmisartan, but BP is normal.  She uses a walker for balance when her knees are bothering her.  No dyspnea walking slowly on flat ground but wears out after walking about 50 yards.  She is doing more around the house.  No lightheadedness or syncope.  No chest pain.  She has been unable to increase selexipag beyond 600 mcg bid due to diarrhea.  Weight is down 7 lbs.  She is getting ready to start the [facility] Prep class.  Labs (6/21): K 4.5, creatinine 2.09 Labs (8/21): K 4.3, creatinine 2.11 Labs (9/21): K 3.9, creatinine 2.0 Labs (10/21): BNP 58 Labs (12/21): K 3.8, creatinine 2.28 Labs (1/22): BNP 160, K 3.7, creatinine 1.79  6 minute walk (9/21): 168 m 6 minute walk (10/21): 122 m 6 minute walk (3/22): 107 m  PMH: 1. OHS/OSA: Uses home oxygen.  2. CKD stage IV 3. COVID-19 PNA in 12/20 4. Restrictive lung disease: PFTs (2/21) with severe restriction, severely decreased DLCO.  -  High resolution CT chest (2/21): no interstitial lung disease, "small vessel disease."  5. Gout 6. HTN 7. DVT: Remote, after train ride.  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  8. Pulmonary hypertension: RHC (2/21) with mean RA 14, PA 89/33 mean 54, mean PCWP 34, CI 2.22, PVR 3.87 WU => mixed pulmonary arterial/pulmonary venous hypertension.  - V/Q scan (2/21): No evidence for chronic PE.  - Echo (2/21): EF 55-60%, moderately dilated RV with moderately decreased systolic function.  - HIV and rheumatologic serologies negative.   - RHC (9/21): mean RA 9, PA 68/23 mean 40, mean PCWP 11, PVR 5.6 WU, no step up in oxygen saturation so no evidence for left to right shunting.  9. Depression   Current Outpatient Medications  Medication Sig Dispense Refill  . acetaminophen (TYLENOL) 500 MG tablet Take 1,000 mg by mouth every 6 (six) hours as needed for moderate pain or headache.    . allopurinol (ZYLOPRIM) 100 MG tablet Take 100 mg by mouth daily.    Marland Kitchen apixaban (ELIQUIS) 5 MG TABS tablet Take 1 tablet (5 mg total) by mouth 2 (two) times daily. 60 tablet 3  . Biotin w/ Vitamins C & E (HAIR SKIN & NAILS GUMMIES PO) Take 2 tablets by mouth daily.    . Cholecalciferol (VITAMIN D3) 125 MCG (5000 UT) CAPS Take 1 capsule by mouth daily.    . colchicine 0.6 MG tablet Take 0.6 mg by mouth daily as needed (gout flare).    . diphenhydramine-acetaminophen (TYLENOL PM) 25-500 MG TABS tablet Take 2 tablets by mouth at bedtime as needed (sleep).    . fluticasone furoate-vilanterol (BREO ELLIPTA) 200-25 MCG/INH AEPB Inhale 1 puff into the lungs daily. 60 each 5  . gabapentin (NEURONTIN) 100 MG capsule Take 300 mg by mouth at bedtime.    Marland Kitchen ipratropium-albuterol (DUONEB) 0.5-2.5 (3) MG/3ML SOLN Take 3 mLs by nebulization.    . macitentan (OPSUMIT) 10 MG tablet Take 1 tablet (10 mg total) by mouth daily. 30 tablet 11  . Multiple Vitamins-Minerals (ADULT GUMMY) CHEW Chew 2 tablets by mouth daily.    . OXYGEN Inhale 4-8 L into the lungs continuous.    . pantoprazole (PROTONIX) 40 MG tablet Take 40 mg by mouth daily.    . potassium chloride SA (KLOR-CON) 20 MEQ tablet Take 20 mEq by mouth daily.     . Selexipag (UPTRAVI) 600 MCG TABS Take by mouth 2 (two) times daily.    . Selexipag (UPTRAVI) 600 MCG TABS Take 1 capsule by mouth daily.    . sertraline (ZOLOFT) 25 MG tablet Take 1 tablet (25 mg total) by mouth daily. 30 tablet 2  . spironolactone (ALDACTONE) 25 MG tablet TAKE 1/2 TABLET(12.5 MG) BY MOUTH DAILY 45 tablet 3  . tadalafil, PAH,  (ADCIRCA) 20 MG tablet Take 2 tablets (40 mg total) by mouth daily. 60 tablet 11  . torsemide (DEMADEX) 20 MG tablet Take 2 tablets (40 mg total) by mouth 2 (two) times daily. 120 tablet 3  . traMADol (ULTRAM) 50 MG tablet Take 50 mg by mouth every 6 (six) hours as needed for moderate pain or severe pain.      No current facility-administered medications for this encounter.    Allergies  Allergen Reactions  . Adhesive [Tape] Hives    Tolerates paper tape  . Succinylcholine Other (See Comments)    Have trouble waking up  . Tizanidine Hives      Social History  Socioeconomic History  . Marital status: Single    Spouse name: Not on file  . Number of children: Not on file  . Years of education: Not on file  . Highest education level: Not on file  Occupational History  . Not on file  Tobacco Use  . Smoking status: Former Research scientist (life sciences)  . Smokeless tobacco: Never Used  . Tobacco comment: only smoked for fun in highschool not even one a day  Vaping Use  . Vaping Use: Never used  Substance and Sexual Activity  . Alcohol use: Yes    Comment: occ  . Drug use: Not Currently  . Sexual activity: Yes    Partners: Male    Comment: 1st intercourse- 70, partners- 52, current partner- 10 yrs   Other Topics Concern  . Not on file  Social History Narrative  . Not on file   Social Determinants of Health   Financial Resource Strain: High Risk  . Difficulty of Paying Living Expenses: Very hard  Food Insecurity: Food Insecurity Present  . Worried About Charity fundraiser in the Last Year: Sometimes true  . Ran Out of Food in the Last Year: Never true  Transportation Needs: No Transportation Needs  . Lack of Transportation (Medical): No  . Lack of Transportation (Non-Medical): No  Physical Activity: Not on file  Stress: Not on file  Social Connections: Not on file  Intimate Partner Violence: Not on file      Family History  Problem Relation Age of Onset  . COPD Father   . Cancer  Mother        gallbladder and bladder   . Hypertension Brother   . Breast cancer Other   . Diabetes Son   . Breast cancer Cousin   . Breast cancer Cousin   . Pulmonary Hypertension Neg Hx     Vitals:   01/04/21 0850  BP: 120/78  Pulse: 97  SpO2: 97%  Weight: 114.7 kg (252 lb 12.8 oz)   Wt Readings from Last 3 Encounters:  01/04/21 114.7 kg (252 lb 12.8 oz)  01/03/21 113.5 kg (250 lb 3.2 oz)  12/30/20 113.4 kg (250 lb)   Exam:   BP 120/78   Pulse 97   Wt 114.7 kg (252 lb 12.8 oz)   LMP  (LMP Unknown) Comment: pmb last 2-3 months  SpO2 97% Comment: 4 l n/c  BMI 47.77 kg/m   General: NAD Neck: No JVD, no thyromegaly or thyroid nodule.  Lungs: Clear to auscultation bilaterally with normal respiratory effort. CV: Nondisplaced PMI.  Heart regular S1/S2, no S3/S4, no murmur.  No peripheral edema.  No carotid bruit.  Normal pedal pulses.  Abdomen: Soft, nontender, no hepatosplenomegaly, no distention.  Skin: Intact without lesions or rashes.  Neurologic: Alert and oriented x 3.  Psych: Normal affect. Extremities: No clubbing or cyanosis.  HEENT: Normal.    ASSESSMENT & PLAN:  1. Chronic diastolic CHF with significant RV dysfunction: Echo in 2/21 with LVEF 55-60%, moderate RV dilation/moderate RV dysfunction.  [place] 2/18 showed elevated left and right heart filling pressures with severe pulmonary hypertension.  Suspect mixed pulmonary venous/pulmonary arterial hypertension in setting of significant LV diastolic dysfunction (PVR 3.87 WU). Repeat RHC in 9/21 with moderate-severe PAH, normal filling pressures, and PVR 5.6 WU.  No evidence for left=>right shunting.  NYHA class III symptoms, stable.  Weight is down.  - Continue torsemide 40 mg daily, BMET today.  2. Pulmonary hypertension: Suspect that this is a mixed picture  with group 2 and group 3 PH (related to diastolic LV dysfunction and OHS/OSA).  However, concerned for component of group 1 PAH as well. V/Q scan not suggestive  of chronic PEs and high resolution CT chest in 2/21 was not suggestive of ILD. HIV and rheumatologic serologies negative. RHC showed severe mixed pulmonary venous/pulmonary arterial hypertension, repeat RHC after diuresis in 9/21 showed PAH. PFTs completed in 2/21 showed severe restriction, severely decreased DLCO (?restriction due to body habitus). She is on Opsumit and Uptravi 600 mcg bid.  She is out of tadalafil, trying to get it refilled.   6 minute walk was less today, I suspect that deconditioning as well as pulmonary hypertension and CHF play a role in this.  - Continue to maintain oxygen saturation with home O2 and CPAP. Weight loss also would be helpful.  - Continue Opsumit 10 mg daily. - Restart tadalafil 40 mg daily, our HF pharmacist is going to help her with this.  - She has not tolerated more than 600 mcg bid of Uptravi.      - Check BNP today - Needs more exercise, to start the [facility] Prep class.  - Repeat echo at followup to reassess RV and PA pressure estimate.  3. HTN: BP controlled off telmisartan and nifedipine XR.  4. CKD stage 3b - BMET today.  - Followup with nephrology.  5. OHS/OSA: compliant w/ CPAP and home oxygen.  - Needs weight loss. Plans to do [employer] Prep class.  6. 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  As she is at baseline sedentary and has had recurrent DVTs, would continue Eliquis long-term.   7. Depression: Continue sertraline.    Followup in 2 months with echo .    Signed, [name], MD  01/04/2021  [facility] [address] [facility] [place] [address] [phone] (office) [phone] (fax)

## 2021-01-06 ENCOUNTER — Other Ambulatory Visit (HOSPITAL_COMMUNITY): Payer: Self-pay

## 2021-01-06 MED ORDER — TADALAFIL (PAH) 20 MG PO TABS: 40.0000 mg | ORAL_TABLET | Freq: Every day | ORAL | 5 refills | Status: DC

## 2021-01-11 ENCOUNTER — Other Ambulatory Visit (HOSPITAL_COMMUNITY): Payer: Self-pay

## 2021-01-11 NOTE — Progress Notes (Signed)
Paramedicine Encounter    Patient ID: Bryona Foxworthy, female    DOB: Dec 23, 1965, 55 y.o.   MRN: [id number]   Patient Care Team: Hayden Rasmussen, MD as PCP - General (Family Medicine) Larey Dresser, MD as PCP - Advanced Heart Failure (Cardiology)  Patient Active Problem List   Diagnosis Date Noted  . Chronic diastolic (congestive) heart failure ([place]) 06/10/2020  . Restrictive lung disease 05/11/2020  . RVF (right ventricular failure) ([place])   . Exertional dyspnea   . Chronic respiratory failure with hypoxia ([place]) 11/20/2019  . OSA on CPAP 11/20/2019  . Complex care coordination 11/20/2019  . Morbid obesity with BMI of 45.0-49.9, adult ([place]) 11/20/2019  . Pulmonary hypertension ([place]) 10/30/2019  . Gout 10/30/2019  . CKD (chronic kidney disease) stage 3, GFR 30-59 ml/min (HCC) 10/30/2019    Current Outpatient Medications:  .  acetaminophen (TYLENOL) 500 MG tablet, Take 1,000 mg by mouth every 6 (six) hours as needed for moderate pain or headache., Disp: , Rfl:  .  allopurinol (ZYLOPRIM) 100 MG tablet, Take 100 mg by mouth daily., Disp: , Rfl:  .  apixaban (ELIQUIS) 5 MG TABS tablet, Take 1 tablet (5 mg total) by mouth 2 (two) times daily., Disp: 60 tablet, Rfl: 3 .  Biotin w/ Vitamins C & E (HAIR SKIN & NAILS GUMMIES PO), Take 2 tablets by mouth daily., Disp: , Rfl:  .  Cholecalciferol (VITAMIN D3) 125 MCG (5000 UT) CAPS, Take 1 capsule by mouth daily., Disp: , Rfl:  .  colchicine 0.6 MG tablet, Take 0.6 mg by mouth daily as needed (gout flare)., Disp: , Rfl:  .  diphenhydramine-acetaminophen (TYLENOL PM) 25-500 MG TABS tablet, Take 2 tablets by mouth at bedtime as needed (sleep)., Disp: , Rfl:  .  fluticasone furoate-vilanterol (BREO ELLIPTA) 200-25 MCG/INH AEPB, Inhale 1 puff into the lungs daily., Disp: 60 each, Rfl: 5 .  gabapentin (NEURONTIN) 100 MG capsule, Take 300 mg by mouth at bedtime., Disp: , Rfl:  .  ipratropium-albuterol (DUONEB) 0.5-2.5 (3) MG/3ML SOLN, Take 3 mLs by  nebulization., Disp: , Rfl:  .  macitentan (OPSUMIT) 10 MG tablet, Take 1 tablet (10 mg total) by mouth daily., Disp: 30 tablet, Rfl: 11 .  Multiple Vitamins-Minerals (ADULT GUMMY) CHEW, Chew 2 tablets by mouth daily., Disp: , Rfl:  .  OXYGEN, Inhale 4-8 L into the lungs continuous., Disp: , Rfl:  .  pantoprazole (PROTONIX) 40 MG tablet, Take 40 mg by mouth daily., Disp: , Rfl:  .  potassium chloride SA (KLOR-CON) 20 MEQ tablet, Take 20 mEq by mouth daily. , Disp: , Rfl:  .  Selexipag (UPTRAVI) 600 MCG TABS, Take by mouth 2 (two) times daily., Disp: , Rfl:  .  sertraline (ZOLOFT) 25 MG tablet, Take 1 tablet (25 mg total) by mouth daily., Disp: 30 tablet, Rfl: 2 .  spironolactone (ALDACTONE) 25 MG tablet, TAKE 1/2 TABLET(12.5 MG) BY MOUTH DAILY, Disp: 45 tablet, Rfl: 3 .  tadalafil, PAH, (ADCIRCA) 20 MG tablet, Take 2 tablets (40 mg total) by mouth daily., Disp: 60 tablet, Rfl: 5 .  torsemide (DEMADEX) 20 MG tablet, Take 2 tablets (40 mg total) by mouth 2 (two) times daily. (Patient taking differently: Take 40 mg by mouth daily.), Disp: 120 tablet, Rfl: 3 .  traMADol (ULTRAM) 50 MG tablet, Take 50 mg by mouth every 6 (six) hours as needed for moderate pain or severe pain. , Disp: , Rfl:  .  Selexipag (UPTRAVI) 600 MCG TABS, Take 1  capsule by mouth daily., Disp: , Rfl:  Allergies  Allergen Reactions  . Adhesive [Tape] Hives    Tolerates paper tape  . Succinylcholine Other (See Comments)    Have trouble waking up  . Tizanidine Hives      Social History   Socioeconomic History  . Marital status: Single    Spouse name: Not on file  . Number of children: Not on file  . Years of education: Not on file  . Highest education level: Not on file  Occupational History  . Not on file  Tobacco Use  . Smoking status: Former Research scientist (life sciences)  . Smokeless tobacco: Never Used  . Tobacco comment: only smoked for fun in highschool not even one a day  Vaping Use  . Vaping Use: Never used  Substance and  Sexual Activity  . Alcohol use: Yes    Comment: occ  . Drug use: Not Currently  . Sexual activity: Yes    Partners: Male    Comment: 1st intercourse- 19, partners- 71, current partner- 10 yrs   Other Topics Concern  . Not on file  Social History Narrative  . Not on file   Social Determinants of Health   Financial Resource Strain: High Risk  . Difficulty of Paying Living Expenses: Very hard  Food Insecurity: Food Insecurity Present  . Worried About Charity fundraiser in the Last Year: Sometimes true  . Ran Out of Food in the Last Year: Never true  Transportation Needs: No Transportation Needs  . Lack of Transportation (Medical): No  . Lack of Transportation (Non-Medical): No  Physical Activity: Not on file  Stress: Not on file  Social Connections: Not on file  Intimate Partner Violence: Not on file    Physical Exam      Future Appointments  Date Time Provider [place]  03/08/2021  1:00 PM [facility] ECHO OP 1 [facility]  03/08/2021  2:20 PM Larey Dresser, MD [facility] None  04/28/2021 11:30 AM Princess Bruins, MD [facility] None    BP 126/72   Pulse 82   Resp 18   LMP  (LMP Unknown) Comment: pmb last 2-3 months  SpO2 97%   Weight yesterday-250 Last visit weight-252 @ clinic   Pt reports she is doing good. She denies increased sob, no dizziness, she started the PREP program, she likes it so far.  She did get that antibiotic and took it, it was only 3 days of it. She denies having more symptoms of that. Believes her back pain is muscular in nature.   -tadalafil p/u--it was actually delivered today.  -she has a few things at pharmacy that needs to be delivered  She having trouble affording the breo ellipta inhaler, I will get her a patient assist application for this.  meds reviewed. She has everything right now.  She will get a printout of her out of pocket expenses from pharmacy to see where she is at-she needs those for eliquis assist and the trelegy assist  for that inhaler.  Will keep me updated on that.     Marylouise Stacks, [facility] Paramedic  01/11/21

## 2021-01-14 ENCOUNTER — Telehealth (HOSPITAL_COMMUNITY): Payer: Self-pay | Admitting: Pharmacy Technician

## 2021-01-14 NOTE — Telephone Encounter (Signed)
Sent in [place] application via fax.  Will follow up.

## 2021-01-17 NOTE — Progress Notes (Signed)
[facility] [employer] PREP Weekly Session   Patient Details  Name: Dawn Fowler MRN: [id number] Date of Birth: [birth year] Age: 55 y.o. PCP: Hayden Rasmussen, MD  Vitals:   01/17/21 1500  Weight: 252 lb (114.3 kg)     [name] [employer] Weekly seesion - 01/17/21 1600      Weekly Session   Topic Discussed Importance of resistance training;Other ways to be active    Minutes exercised this week 79 minutes    Classes attended to date [place] 01/17/2021, 4:14 PM

## 2021-01-19 ENCOUNTER — Other Ambulatory Visit: Payer: Self-pay | Admitting: Family Medicine

## 2021-01-19 DIAGNOSIS — N950 Postmenopausal bleeding: Secondary | ICD-10-CM

## 2021-01-24 NOTE — Progress Notes (Signed)
[facility] [employer] PREP Weekly Session   Patient Details  Name: Dawn Fowler MRN: [id number] Date of Birth: 05-26-1966 Age: 55 y.o. PCP: Hayden Rasmussen, MD  Vitals:   01/24/21 1430  Weight: 247 lb (112 kg)     [name] [employer] Weekly seesion - 01/24/21 1600      Weekly Session   Topic Discussed Healthy eating tips    Minutes exercised this week 35 minutes    Classes attended to date [place] 01/24/2021, 4:21 PM

## 2021-01-28 ENCOUNTER — Other Ambulatory Visit: Payer: Self-pay | Admitting: Family Medicine

## 2021-01-28 DIAGNOSIS — Z1231 Encounter for screening mammogram for malignant neoplasm of breast: Secondary | ICD-10-CM

## 2021-01-31 NOTE — Progress Notes (Signed)
[facility] [employer] PREP Weekly Session   Patient Details  Name: Dawn Fowler MRN: [id number] Date of Birth: Mar 02, 1966 Age: 55 y.o. PCP: Hayden Rasmussen, MD  There were no vitals filed for this visit.   [name] [employer] Weekly seesion - 01/31/21 1600      Weekly Session   Topic Discussed Health habits   Sugar Demo   Minutes exercised this week 80 minutes    Classes attended to date [place] 01/31/2021, 4:15 PM

## 2021-02-03 ENCOUNTER — Other Ambulatory Visit: Payer: Medicare Other

## 2021-02-03 ENCOUNTER — Telehealth (HOSPITAL_COMMUNITY): Payer: Self-pay | Admitting: Pharmacy Technician

## 2021-02-03 ENCOUNTER — Other Ambulatory Visit (HOSPITAL_COMMUNITY): Payer: Self-pay

## 2021-02-03 NOTE — Progress Notes (Signed)
Paramedicine Encounter    Patient ID: Dawn Fowler, female    DOB: 10-01-1966, 55 y.o.   MRN: [id number]   Patient Care Team: Hayden Rasmussen, MD as PCP - General (Family Medicine) Larey Dresser, MD as PCP - Advanced Heart Failure (Cardiology) Jorge Ny, LCSW as Social Worker (Licensed Clinical Social Worker)  Patient Active Problem List   Diagnosis Date Noted  . Chronic diastolic (congestive) heart failure ([place]) 06/10/2020  . Restrictive lung disease 05/11/2020  . RVF (right ventricular failure) ([place])   . Exertional dyspnea   . Chronic respiratory failure with hypoxia ([place]) 11/20/2019  . OSA on CPAP 11/20/2019  . Complex care coordination 11/20/2019  . Morbid obesity with BMI of 45.0-49.9, adult ([place]) 11/20/2019  . Pulmonary hypertension ([place]) 10/30/2019  . Gout 10/30/2019  . CKD (chronic kidney disease) stage 3, GFR 30-59 ml/min (HCC) 10/30/2019    Current Outpatient Medications:  .  acetaminophen (TYLENOL) 500 MG tablet, Take 1,000 mg by mouth every 6 (six) hours as needed for moderate pain or headache., Disp: , Rfl:  .  allopurinol (ZYLOPRIM) 100 MG tablet, Take 100 mg by mouth daily., Disp: , Rfl:  .  apixaban (ELIQUIS) 5 MG TABS tablet, Take 1 tablet (5 mg total) by mouth 2 (two) times daily., Disp: 60 tablet, Rfl: 3 .  Biotin w/ Vitamins C & E (HAIR SKIN & NAILS GUMMIES PO), Take 2 tablets by mouth daily., Disp: , Rfl:  .  Cholecalciferol (VITAMIN D3) 125 MCG (5000 UT) CAPS, Take 1 capsule by mouth daily., Disp: , Rfl:  .  colchicine 0.6 MG tablet, Take 0.6 mg by mouth daily as needed (gout flare)., Disp: , Rfl:  .  diphenhydramine-acetaminophen (TYLENOL PM) 25-500 MG TABS tablet, Take 2 tablets by mouth at bedtime as needed (sleep)., Disp: , Rfl:  .  fluticasone furoate-vilanterol (BREO ELLIPTA) 200-25 MCG/INH AEPB, Inhale 1 puff into the lungs daily., Disp: 60 each, Rfl: 5 .  gabapentin (NEURONTIN) 100 MG capsule, Take 300 mg by mouth at bedtime., Disp: , Rfl:  .   ipratropium-albuterol (DUONEB) 0.5-2.5 (3) MG/3ML SOLN, Take 3 mLs by nebulization., Disp: , Rfl:  .  macitentan (OPSUMIT) 10 MG tablet, Take 1 tablet (10 mg total) by mouth daily., Disp: 30 tablet, Rfl: 11 .  Multiple Vitamins-Minerals (ADULT GUMMY) CHEW, Chew 2 tablets by mouth daily., Disp: , Rfl:  .  OXYGEN, Inhale 4-8 L into the lungs continuous., Disp: , Rfl:  .  pantoprazole (PROTONIX) 40 MG tablet, Take 40 mg by mouth daily., Disp: , Rfl:  .  potassium chloride SA (KLOR-CON) 20 MEQ tablet, Take 20 mEq by mouth daily. , Disp: , Rfl:  .  Selexipag (UPTRAVI) 600 MCG TABS, Take by mouth 2 (two) times daily., Disp: , Rfl:  .  sertraline (ZOLOFT) 25 MG tablet, Take 1 tablet (25 mg total) by mouth daily., Disp: 30 tablet, Rfl: 2 .  spironolactone (ALDACTONE) 25 MG tablet, TAKE 1/2 TABLET(12.5 MG) BY MOUTH DAILY, Disp: 45 tablet, Rfl: 3 .  tadalafil, PAH, (ADCIRCA) 20 MG tablet, Take 2 tablets (40 mg total) by mouth daily., Disp: 60 tablet, Rfl: 5 .  torsemide (DEMADEX) 20 MG tablet, Take 2 tablets (40 mg total) by mouth 2 (two) times daily. (Patient taking differently: Take 40 mg by mouth daily.), Disp: 120 tablet, Rfl: 3 .  traMADol (ULTRAM) 50 MG tablet, Take 50 mg by mouth every 6 (six) hours as needed for moderate pain or severe pain. , Disp: ,  Rfl:  .  Selexipag (UPTRAVI) 600 MCG TABS, Take 1 capsule by mouth daily., Disp: , Rfl:  Allergies  Allergen Reactions  . Adhesive [Tape] Hives    Tolerates paper tape  . Succinylcholine Other (See Comments)    Have trouble waking up  . Tizanidine Hives      Social History   Socioeconomic History  . Marital status: Single    Spouse name: Not on file  . Number of children: Not on file  . Years of education: Not on file  . Highest education level: Not on file  Occupational History  . Not on file  Tobacco Use  . Smoking status: Former Research scientist (life sciences)  . Smokeless tobacco: Never Used  . Tobacco comment: only smoked for fun in highschool not even  one a day  Vaping Use  . Vaping Use: Never used  Substance and Sexual Activity  . Alcohol use: Yes    Comment: occ  . Drug use: Not Currently  . Sexual activity: Yes    Partners: Male    Comment: 1st intercourse- 2, partners- 55, current partner- 10 yrs   Other Topics Concern  . Not on file  Social History Narrative  . Not on file   Social Determinants of Health   Financial Resource Strain: High Risk  . Difficulty of Paying Living Expenses: Very hard  Food Insecurity: Food Insecurity Present  . Worried About Charity fundraiser in the Last Year: Sometimes true  . Ran Out of Food in the Last Year: Never true  Transportation Needs: No Transportation Needs  . Lack of Transportation (Medical): No  . Lack of Transportation (Non-Medical): No  Physical Activity: Not on file  Stress: Not on file  Social Connections: Not on file  Intimate Partner Violence: Not on file    Physical Exam      Future Appointments  Date Time Provider [place]  02/25/2021  1:00 PM [facility] [place] 1 [facility]  03/08/2021  1:00 PM [place] ECHO OP 1 MC-ECHOLAB [facility]  03/08/2021  2:20 PM Larey Dresser, MD MC-HVSC None  03/24/2021  1:20 PM GI-BCG MM 2 GI-BCGMM GI-[facility]  04/28/2021 11:30 AM Princess Bruins, MD GCG-GCG None    BP (!) 132/98   Pulse 82   Resp 20   Wt 244 lb (110.7 kg)   LMP  (LMP Unknown) Comment: pmb last 2-3 months  SpO2 97%   BMI 46.10 kg/m   Weight yesterday-? Last visit weight-250  Pt reports she is doing alright, she has been going through some stuff with her family that has been stressful.  She has been working out a couple times a week for the [employer].  Her weight is down 6 lbs from our last visit.  She feels better. She feels like her stamina in improving.  She feels like her breathing is doing better.  Pt denies c/p or dizziness.  She has not heard back about the eliquis application-it was sent in for 3/18.  Her insurance called her to discuss meds  and they suggested she use [facility] for free delivery.  She thinks she has been taking torsemide bid instead of daily-per pt her kidney doc decreased it to just daily.  She had forgotten it was reduced.  Outside of that she is managing things on her own, she is very independent. Has transportation, goes to gym. Manages meds. No recent issues or complaints.  I told her to be on lookout for a phone call from  [employer]iqu[employer] about that application.  At this point pt can be d/c from paramedicine. She is comfortable with this plan. Advised her to call me if anything changes.   Patient is now discharged from [facility].  Patient has/has not met the following goals:  Yes :Patient expresses basic understanding of medications and what they are for Yes :Patient able to verbalize heart failure specific dietary/fluid restrictions Yes :Patient is aware of who to call if they have medical concerns or if they need to schedule or change appts Yes :Patient has a scale for daily weights and weighs regularly Yes :Patient able to verbalize concerning symptoms when they should call the HF clinic (weight gain ranges, etc) Yes :Patient has a PCP and has seen within the past year or has upcoming appt Yes :Patient has reliable access to getting their medications Yes :Patient has shown they are able to reorder medications reliably No :Patient has had admission in past 30 days- if yes how many? No :Patient has had admission in past 90 days- if yes how many?  Discharge Comments:       Marylouise Stacks, [facility] Paramedic  02/03/21

## 2021-02-03 NOTE — Telephone Encounter (Signed)
Called [employer] to check the status of the patient's application. Representative stated that the patient has a 3% OOP of $417.32 to be met before she will be approved. After that amount is met, we would send an OOP report from the patient's pharmacy to obtain an approval from [place].  Called and left the patient message.   [name], CPhT

## 2021-02-05 ENCOUNTER — Inpatient Hospital Stay: Admission: RE | Admit: 2021-02-05 | Payer: Medicare Other | Source: Ambulatory Visit

## 2021-02-05 DIAGNOSIS — Z1231 Encounter for screening mammogram for malignant neoplasm of breast: Secondary | ICD-10-CM

## 2021-02-14 NOTE — Progress Notes (Signed)
[facility] PREP Weekly Session   Patient Details  Name: Dawn Fowler MRN: [id number] Date of Birth: September 22, 1966 Age: 55 y.o. PCP: Hayden Rasmussen, MD  Vitals:   02/14/21 1430  Weight: 244 lb 9.6 oz (110.9 kg)     [name] [employer] Weekly seesion - 02/14/21 1600      Weekly Session   Topic Discussed Stress management and problem solving   importance of sleep   Classes attended to date [place] 02/14/2021, 4:10 PM

## 2021-02-21 NOTE — Progress Notes (Signed)
[facility] PREP Weekly Session   Patient Details  Name: Dawn Fowler MRN: [id number] Date of Birth: Feb 07, 1966 Age: 55 y.o. PCP: Hayden Rasmussen, MD  Vitals:   02/21/21 1611  Weight: 245 lb 12.8 oz (111.5 kg)     [name] [employer] Weekly seesion - 02/21/21 1600      Weekly Session   Topic Discussed Expectations and non-scale victories    Minutes exercised this week 70 minutes    Classes attended to date [place] 02/21/2021, 4:12 PM

## 2021-02-25 ENCOUNTER — Other Ambulatory Visit: Payer: Medicare Other

## 2021-02-28 ENCOUNTER — Other Ambulatory Visit: Payer: Self-pay | Admitting: Family Medicine

## 2021-02-28 ENCOUNTER — Ambulatory Visit
Admission: RE | Admit: 2021-02-28 | Discharge: 2021-02-28 | Disposition: A | Discharge status: Home / Self Care | Payer: Medicare Other | Source: Ambulatory Visit | Attending: Family Medicine | Admitting: Family Medicine

## 2021-02-28 DIAGNOSIS — M545 Low back pain, unspecified: Secondary | ICD-10-CM

## 2021-03-03 ENCOUNTER — Telehealth (HOSPITAL_COMMUNITY): Payer: Self-pay | Admitting: Licensed Clinical Social Worker

## 2021-03-03 NOTE — Telephone Encounter (Signed)
Pt called CSW to discuss current need for help with Electric bill.  States she owes $239.36 at this time but no turn off notice.  CSW provided pt with list of local crisis assistance programs for her to call and inquire about assistance.    Pt will follow up regarding community options and update CSW regarding progress.  Will continue to follow and assist as needed  Jorge Ny, [facility] Desk#: [phone] Cell#: [phone]

## 2021-03-07 NOTE — Progress Notes (Signed)
[facility] PREP Weekly Session   Patient Details  Name: Dawn Fowler MRN: [id number] Date of Birth: Apr 11, 1966 Age: 55 y.o. PCP: Hayden Rasmussen, MD  Vitals:   03/07/21 1533  Weight: 247 lb (112 kg)     [name] [employer] Weekly seesion - 03/07/21 1500      Weekly Session   Topic Discussed Finding support    Minutes exercised this week 120 minutes    Classes attended to date [place] 03/07/2021, 3:35 PM

## 2021-03-08 ENCOUNTER — Other Ambulatory Visit (HOSPITAL_COMMUNITY): Payer: Self-pay

## 2021-03-08 ENCOUNTER — Ambulatory Visit (HOSPITAL_BASED_OUTPATIENT_CLINIC_OR_DEPARTMENT_OTHER)
Admission: RE | Admit: 2021-03-08 | Discharge: 2021-03-08 | Disposition: A | Discharge status: Home / Self Care | Payer: Medicare Other | Source: Ambulatory Visit | Attending: Cardiology | Admitting: Cardiology

## 2021-03-08 ENCOUNTER — Ambulatory Visit (HOSPITAL_COMMUNITY)
Admission: RE | Admit: 2021-03-08 | Discharge: 2021-03-08 | Disposition: A | Discharge status: Home / Self Care | Payer: Medicare Other | Source: Ambulatory Visit | Attending: Family Medicine | Admitting: Family Medicine

## 2021-03-08 ENCOUNTER — Other Ambulatory Visit: Payer: Self-pay

## 2021-03-08 ENCOUNTER — Encounter (HOSPITAL_COMMUNITY): Payer: Self-pay | Admitting: Cardiology

## 2021-03-08 VITALS — BP 148/88 | HR 65 | Wt 249.2 lb

## 2021-03-08 DIAGNOSIS — F32A Depression, unspecified: Secondary | ICD-10-CM | POA: Insufficient documentation

## 2021-03-08 DIAGNOSIS — G4733 Obstructive sleep apnea (adult) (pediatric): Secondary | ICD-10-CM | POA: Diagnosis not present

## 2021-03-08 DIAGNOSIS — Z596 Low income: Secondary | ICD-10-CM | POA: Insufficient documentation

## 2021-03-08 DIAGNOSIS — Z7901 Long term (current) use of anticoagulants: Secondary | ICD-10-CM | POA: Diagnosis not present

## 2021-03-08 DIAGNOSIS — Z9981 Dependence on supplemental oxygen: Secondary | ICD-10-CM | POA: Insufficient documentation

## 2021-03-08 DIAGNOSIS — Z888 Allergy status to other drugs, medicaments and biological substances status: Secondary | ICD-10-CM | POA: Diagnosis not present

## 2021-03-08 DIAGNOSIS — Z8616 Personal history of COVID-19: Secondary | ICD-10-CM | POA: Diagnosis not present

## 2021-03-08 DIAGNOSIS — Z5941 Food insecurity: Secondary | ICD-10-CM | POA: Diagnosis not present

## 2021-03-08 DIAGNOSIS — I130 Hypertensive heart and chronic kidney disease with heart failure and stage 1 through stage 4 chronic kidney disease, or unspecified chronic kidney disease: Secondary | ICD-10-CM | POA: Insufficient documentation

## 2021-03-08 DIAGNOSIS — N184 Chronic kidney disease, stage 4 (severe): Secondary | ICD-10-CM | POA: Insufficient documentation

## 2021-03-08 DIAGNOSIS — I5032 Chronic diastolic (congestive) heart failure: Secondary | ICD-10-CM

## 2021-03-08 DIAGNOSIS — I2721 Secondary pulmonary arterial hypertension: Secondary | ICD-10-CM | POA: Diagnosis not present

## 2021-03-08 DIAGNOSIS — Z79899 Other long term (current) drug therapy: Secondary | ICD-10-CM | POA: Insufficient documentation

## 2021-03-08 DIAGNOSIS — I50810 Right heart failure, unspecified: Secondary | ICD-10-CM | POA: Diagnosis not present

## 2021-03-08 DIAGNOSIS — Z87891 Personal history of nicotine dependence: Secondary | ICD-10-CM | POA: Insufficient documentation

## 2021-03-08 DIAGNOSIS — M25569 Pain in unspecified knee: Secondary | ICD-10-CM | POA: Diagnosis not present

## 2021-03-08 DIAGNOSIS — Z7984 Long term (current) use of oral hypoglycemic drugs: Secondary | ICD-10-CM | POA: Insufficient documentation

## 2021-03-08 DIAGNOSIS — Z86718 Personal history of other venous thrombosis and embolism: Secondary | ICD-10-CM | POA: Diagnosis not present

## 2021-03-08 DIAGNOSIS — Z7951 Long term (current) use of inhaled steroids: Secondary | ICD-10-CM | POA: Diagnosis not present

## 2021-03-08 DIAGNOSIS — I272 Pulmonary hypertension, unspecified: Secondary | ICD-10-CM | POA: Diagnosis not present

## 2021-03-08 DIAGNOSIS — I340 Nonrheumatic mitral (valve) insufficiency: Secondary | ICD-10-CM | POA: Insufficient documentation

## 2021-03-08 LAB — BASIC METABOLIC PANEL
Anion gap: 11 (ref 5–15)
BUN: 27 mg/dL — ABNORMAL HIGH (ref 6–20)
CO2: 23 mmol/L (ref 22–32)
Calcium: 9.3 mg/dL (ref 8.9–10.3)
Chloride: 106 mmol/L (ref 98–111)
Creatinine, Ser: 2.03 mg/dL — ABNORMAL HIGH (ref 0.44–1.00)
GFR, Estimated: 29 mL/min — ABNORMAL LOW (ref >60)
Glucose, Bld: 98 mg/dL (ref 70–99)
Potassium: 4.1 mmol/L (ref 3.5–5.1)
Sodium: 140 mmol/L (ref 135–145)

## 2021-03-08 LAB — ECHOCARDIOGRAM COMPLETE
Area-P 1/2: 2.79 cm2
S' Lateral: 3.9 cm

## 2021-03-08 LAB — BRAIN NATRIURETIC PEPTIDE: B Natriuretic Peptide: 116.6 pg/mL — ABNORMAL HIGH (ref 0.0–100.0)

## 2021-03-08 MED ORDER — TORSEMIDE 20 MG PO TABS: ORAL_TABLET | ORAL | 11 refills | Status: DC

## 2021-03-08 MED ORDER — DAPAGLIFLOZIN PROPANEDIOL 10 MG PO TABS: 10.0000 mg | ORAL_TABLET | Freq: Every day | ORAL | 11 refills | Status: DC

## 2021-03-08 MED ORDER — UPTRAVI 800 MCG PO TABS
1.0000 | ORAL_TABLET | Freq: Two times a day (BID) | ORAL | 11 refills | Status: DC
Start: 2021-03-08 — End: 2021-03-11

## 2021-03-08 NOTE — Progress Notes (Signed)
ID:  Dawn Fowler, DOB May 31, 1966, MRN [id number]   Provider location: [place] Advanced Heart Failure Type of Visit: Established patient   PCP:  Hayden Rasmussen, MD  Cardiologist:  Dr. Aundra Dubin   History of Present Illness: Dawn Fowler is a 55 y.o. 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  She is on CPAP at home and 6L home oxygen (says she has been on oxygen for "years.").  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  She additionally has significant HTN.  She had been on Opsumit 10 + tadalafil 20 mg daily but ran out of this prior to her 2/21 admission. She also has a history of prior DVT after long train ride, has had 2 negative V/Q scans.  She was hospitalized for COVID-19 at [facility] in 1/21.   She was seen by [facility] Pulmonology after discharge from [place] admission and was noted to be volume overloaded, Lasix was changed to torsemide but she was unable to get to pharmacy to pick up. Subsequently, she developed progressive wt gain, nearly 20 lb and increased dyspnea, prompting her to report back to the ED in 2/21, where she was found to be in acute on chronic diastolic HF and readmitted for IV diuresis and AHF consultation. Echo in 2/21 showed LVEF 55-60%, moderate RV dilation/moderate RV dysfunction. [place] 2/21 showed elevated left and right heart filling pressures with severe pulmonary hypertension. PFTs completed showing severe restriction and severely decreased DLCO. High resolution CT was concerning  for small vessel disease, no ILD, and concerning for PAH.  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 She was continued on IV diuretics and had good response, diuresing 32 lb. She was transitioned  to torsemide 40 mg/20 mg. Opsumit and tadalafil restarted.   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  She is on Eliquis. RHC in 9/21 showed normal RA pressure and PCWP, moderate-severe pulmonary arterial hypertension.   Echo was done today and reviewed, EF 55% with mildly enlarged RV with mild RV dysfunction, mildly D-shaped septum, PASP 48 with normal IVC.   She presents today for followup of CHF/RV failure and pulmonary hypertension. She continues to use home oxygen and CPAP.  She has been going to the [facility] PREP class.  She feels like this has helped her stamina and mobility.  Weight is down 3 lbs.  Still gets short of breath with moderate exertion but able to use elliptical and exercise bike. No lightheadedness/syncope.  No chest pain.  She is limited by knee pain. She is tolerating current dose of selexipag without diarrhea.   ECG (personally reviewed): NSR, poor RWP  Labs (6/21): K 4.5, creatinine 2.09 Labs (8/21): K 4.3, creatinine 2.11 Labs (9/21): K 3.9, creatinine 2.0 Labs (10/21): BNP 58 Labs (12/21): K 3.8, creatinine 2.28 Labs (1/22): BNP 160, K 3.7, creatinine 1.79 Labs (3/22): BNP 15.7, K 3.9, creatinine 2.33  6 minute walk (9/21): 168 m 6 minute walk (10/21): 122 m 6 minute walk (3/22): 107 m  PMH: 1. OHS/OSA: Uses home oxygen.  2. CKD stage IV 3. COVID-19 PNA in 12/20 4. Restrictive lung disease: PFTs (2/21) with severe restriction, severely  decreased DLCO.  - High resolution CT chest (2/21): no interstitial lung disease, "small vessel disease."  5. Gout 6. HTN 7. DVT: Remote, after train ride.  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  8. Pulmonary hypertension: RHC (2/21) with mean RA 14, PA 89/33 mean 54, mean PCWP 34, CI 2.22, PVR 3.87 WU => mixed pulmonary arterial/pulmonary venous hypertension.  - V/Q scan (2/21): No evidence for chronic PE.  - Echo (2/21): EF 55-60%, moderately dilated RV with moderately decreased  systolic function.  - HIV and rheumatologic serologies negative.  - RHC (9/21): mean RA 9, PA 68/23 mean 40, mean PCWP 11, PVR 5.6 WU, no step up in oxygen saturation so no evidence for left to right shunting.  - Echo (5/22): EF 55% with mildly enlarged RV with mild RV dysfunction, mildly D-shaped septum, PASP 48 with normal IVC. 9. Depression   Current Outpatient Medications  Medication Sig Dispense Refill  . acetaminophen (TYLENOL) 500 MG tablet Take 1,000 mg by mouth every 6 (six) hours as needed for moderate pain or headache.    . allopurinol (ZYLOPRIM) 100 MG tablet Take 100 mg by mouth daily.    Marland Kitchen apixaban (ELIQUIS) 5 MG TABS tablet Take 1 tablet (5 mg total) by mouth 2 (two) times daily. 60 tablet 3  . Biotin w/ Vitamins C & E (HAIR SKIN & NAILS GUMMIES PO) Take 2 tablets by mouth daily.    . Cholecalciferol (VITAMIN D3) 125 MCG (5000 UT) CAPS Take 1 capsule by mouth daily.    . colchicine 0.6 MG tablet Take 0.6 mg by mouth daily as needed (gout flare).    . dapagliflozin propanediol (FARXIGA) 10 MG TABS tablet Take 1 tablet (10 mg total) by mouth daily before breakfast. 30 tablet 11  . diphenhydramine-acetaminophen (TYLENOL PM) 25-500 MG TABS tablet Take 2 tablets by mouth at bedtime as needed (sleep).    . fluticasone furoate-vilanterol (BREO ELLIPTA) 200-25 MCG/INH AEPB Inhale 1 puff into the lungs daily. 60 each 5  . gabapentin (NEURONTIN) 100 MG capsule Take 300 mg by mouth at bedtime.    Marland Kitchen ipratropium-albuterol (DUONEB) 0.5-2.5 (3) MG/3ML SOLN Take 3 mLs by nebulization.    . macitentan (OPSUMIT) 10 MG tablet Take 1 tablet (10 mg total) by mouth daily. 30 tablet 11  . Multiple Vitamins-Minerals (ADULT GUMMY) CHEW Chew 2 tablets by mouth daily.    . OXYGEN Inhale 4-8 L into the lungs continuous.    . pantoprazole (PROTONIX) 40 MG tablet Take 40 mg by mouth daily.    . potassium chloride SA (KLOR-CON) 20 MEQ tablet Take 20 mEq by mouth daily.     . Selexipag (UPTRAVI) 800 MCG TABS  Take 1 tablet (800 mcg total) by mouth 2 (two) times daily. 60 tablet 11  . sertraline (ZOLOFT) 25 MG tablet Take 1 tablet (25 mg total) by mouth daily. 30 tablet 2  . spironolactone (ALDACTONE) 25 MG tablet TAKE 1/2 TABLET(12.5 MG) BY MOUTH DAILY 45 tablet 3  . tadalafil, PAH, (ADCIRCA) 20 MG tablet Take 2 tablets (40 mg total) by mouth daily. 60 tablet 5  . traMADol (ULTRAM) 50 MG tablet Take 50 mg by mouth every 6 (six) hours as needed for moderate pain or severe pain.     Marland Kitchen torsemide (DEMADEX) 20 MG tablet Take 2 tablets (40 mg total) by mouth every morning AND 1 tablet (20 mg total) every evening. 90 tablet 11   No current facility-administered medications for this  encounter.    Allergies  Allergen Reactions  . Adhesive [Tape] Hives    Tolerates paper tape  . Succinylcholine Other (See Comments)    Have trouble waking up  . Tizanidine Hives      Social History   Socioeconomic History  . Marital status: Single    Spouse name: Not on file  . Number of children: Not on file  . Years of education: Not on file  . Highest education level: Not on file  Occupational History  . Not on file  Tobacco Use  . Smoking status: Former Research scientist (life sciences)  . Smokeless tobacco: Never Used  . Tobacco comment: only smoked for fun in highschool not even one a day  Vaping Use  . Vaping Use: Never used  Substance and Sexual Activity  . Alcohol use: Yes    Comment: occ  . Drug use: Not Currently  . Sexual activity: Yes    Partners: Male    Comment: 1st intercourse- 31, partners- 64, current partner- 10 yrs   Other Topics Concern  . Not on file  Social History Narrative  . Not on file   Social Determinants of Health   Financial Resource Strain: High Risk  . Difficulty of Paying Living Expenses: Very hard  Food Insecurity: Food Insecurity Present  . Worried About Charity fundraiser in the Last Year: Sometimes true  . Ran Out of Food in the Last Year: Never true  Transportation Needs: No  Transportation Needs  . Lack of Transportation (Medical): No  . Lack of Transportation (Non-Medical): No  Physical Activity: Not on file  Stress: Not on file  Social Connections: Not on file  Intimate Partner Violence: Not on file      Family History  Problem Relation Age of Onset  . COPD Father   . Cancer Mother        gallbladder and bladder   . Hypertension Brother   . Breast cancer Other   . Diabetes Son   . Breast cancer Cousin   . Breast cancer Cousin   . Pulmonary Hypertension Neg Hx     Vitals:   03/08/21 1403  BP: (!) 148/88  Pulse: 65  SpO2: 97%  Weight: 113 kg (249 lb 3.2 oz)   Wt Readings from Last 3 Encounters:  03/08/21 113 kg (249 lb 3.2 oz)  03/07/21 112 kg (247 lb)  02/21/21 111.5 kg (245 lb 12.8 oz)   Exam:   BP (!) 148/88   Pulse 65   Wt 113 kg (249 lb 3.2 oz)   LMP  (LMP Unknown) Comment: pmb last 2-3 months  SpO2 97% Comment: 4L N/C  BMI 47.09 kg/m  General: NAD Neck: JVP 8 cm, no thyromegaly or thyroid nodule.  Lungs: Clear to auscultation bilaterally with normal respiratory effort. CV: Nondisplaced PMI.  Heart regular S1/S2, no S3/S4, no murmur.  Trace ankle edema.  No carotid bruit.  Normal pedal pulses.  Abdomen: Soft, nontender, no hepatosplenomegaly, no distention.  Skin: Intact without lesions or rashes.  Neurologic: Alert and oriented x 3.  Psych: Normal affect. Extremities: No clubbing or cyanosis.  HEENT: Normal.   ASSESSMENT & PLAN:  1. Chronic diastolic CHF with significant RV dysfunction: Echo in 2/21 with LVEF 55-60%, moderate RV dilation/moderate RV dysfunction.  [place] 2/18 showed elevated left and right heart filling pressures with severe pulmonary hypertension.  Suspect mixed pulmonary venous/pulmonary arterial hypertension in setting of significant LV diastolic dysfunction (PVR 3.87 WU). Repeat RHC in 9/21 with  moderate-severe PAH, normal filling pressures, and PVR 5.6 WU.  No evidence for left=>right shunting.  Echo today  showed EF 55% with mildly enlarged RV with mild RV dysfunction, mildly D-shaped septum, PASP 48 with normal IVC.  NYHA class III symptoms, stable to improved.  Weight is down.  - Decrease torsemide to 40 qam/20 qpm, BMET today.  - Add Farxiga 10 mg daily. BMET 10 days.  2. Pulmonary hypertension: Suspect that this is a mixed picture with group 2 and group 3 PH (related to diastolic LV dysfunction and OHS/OSA).  However, concerned for component of group 1 PAH as well. V/Q scan not suggestive of chronic PEs and high resolution CT chest in 2/21 was not suggestive of ILD. HIV and rheumatologic serologies negative. RHC showed severe mixed pulmonary venous/pulmonary arterial hypertension, repeat RHC after diuresis in 9/21 showed PAH. PFTs completed in 2/21 showed severe restriction, severely decreased DLCO (?restriction due to body habitus).  Echo today showed mild improvement in RV and improvement in estimated PA systolic pressure.  She is on Opsumit, tadalafil and Uptravi 600 mcg bid.   - 6 minute walk next appointment.  - Continue to maintain oxygen saturation with home O2 and CPAP. Weight loss also would be helpful.  - Continue Opsumit 10 mg daily. - Continue tadalafil 40 mg daily.  - She is tolerating Uptravi 600 mgc bid now, I will have her increase to 800 mcg bid.  If she tolerates this, can increase slowly over time.      - Check BNP today - Continue [employer] [facility] class.  3. HTN: BP controlled off telmisartan and nifedipine XR.  4. CKD stage 3 - BMET today.  - Followup with nephrology.  5. OHS/OSA: compliant w/ CPAP and home oxygen.  6. 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  As she is at baseline sedentary and has had recurrent DVTs, would continue Eliquis long-term.   7. Depression: Continue sertraline.    Followup in 3 months.    Signed, [name], MD  03/08/2021  [place] [address] [facility] [place]  [phone] (office) [phone] (fax)

## 2021-03-08 NOTE — Progress Notes (Signed)
  Echocardiogram 2D Echocardiogram has been performed.  Merrie Roof F 03/08/2021, 2:02 PM

## 2021-03-08 NOTE — Patient Instructions (Addendum)
EKG done today.  Labs done today. We will contact you only if your labs are abnormal.  INCREASE Uptravi 865mcg (1 tablet) by mouth 2 times daily.   DECREASE Torsemide to 40mg  (2 tablets) by mouth every morning and 20mg  (1 tablet) by mouth every evening.  START Farxiga 10mg  (1 tablet) by mouth daily.   No other medication changes were made. Please continue all current medications as prescribed.  Your physician recommends that you schedule a follow-up appointment in: 10 days for a lab only appointment and in 3 months for an appointment with Dr. Aundra Dubin  If you have any questions or concerns before your next appointment please send [place] a message through [facility] or call our office at [phone].    TO LEAVE A MESSAGE FOR THE NURSE SELECT OPTION 2, PLEASE LEAVE A MESSAGE INCLUDING: . YOUR NAME . DATE OF BIRTH . CALL BACK NUMBER . REASON FOR CALL**this is important as we prioritize the call backs  YOU WILL RECEIVE A CALL BACK THE SAME DAY AS LONG AS YOU CALL BEFORE 4:00 PM   Do the following things EVERYDAY: 1) Weigh yourself in the morning before breakfast. Write it down and keep it in a log. 2) Take your medicines as prescribed 3) Eat low salt foods--Limit salt (sodium) to 2000 mg per day.  4) Stay as active as you can everyday 5) Limit all fluids for the day to less than 2 liters   At the [facility], you and your health needs are our priority. As part of our continuing mission to provide you with exceptional heart care, we have created designated Provider Care Teams. These Care Teams include your primary Cardiologist (physician) and Advanced Practice Providers (APPs- Physician Assistants and Nurse Practitioners) who all work together to provide you with the care you need, when you need it.   You may see any of the following providers on your designated Care Team at your next follow up: Marland Kitchen Dr Glori Bickers . Dr [name] . Darrick Grinder, NP . Lyda Jester,  PA . Audry Riles, PharmD   Please be sure to bring in all your medications bottles to every appointment.

## 2021-03-09 ENCOUNTER — Telehealth (HOSPITAL_COMMUNITY): Payer: Self-pay | Admitting: Pharmacy Technician

## 2021-03-09 NOTE — Telephone Encounter (Signed)
The patient was seen in clinic yesterday and started on Farxiga. Her current 30 day co-pay is $88, which is unaffordable.  Sent in [place] application via fax.  Will follow up.

## 2021-03-09 NOTE — Telephone Encounter (Signed)
The patient has been instructed regarding the correct time, dose, and frequency of taking this medication.  Farxiga Samples (4 bottles)  LOT [id number] EXP 05/30/23  Eliquis Samples (4 bottles) LOT [id number] EXP 05/24

## 2021-03-11 ENCOUNTER — Other Ambulatory Visit (HOSPITAL_COMMUNITY): Payer: Self-pay | Admitting: Cardiology

## 2021-03-11 ENCOUNTER — Other Ambulatory Visit (HOSPITAL_COMMUNITY): Payer: Self-pay

## 2021-03-11 MED ORDER — UPTRAVI 800 MCG PO TABS: 1.0000 | ORAL_TABLET | Freq: Two times a day (BID) | ORAL | 11 refills | Status: DC

## 2021-03-12 ENCOUNTER — Encounter (HOSPITAL_COMMUNITY): Payer: Self-pay

## 2021-03-14 ENCOUNTER — Other Ambulatory Visit (HOSPITAL_COMMUNITY): Payer: Self-pay | Admitting: *Deleted

## 2021-03-14 MED ORDER — SERTRALINE HCL 25 MG PO TABS: 25.0000 mg | ORAL_TABLET | Freq: Every day | ORAL | 2 refills | Status: DC

## 2021-03-15 ENCOUNTER — Ambulatory Visit
Admission: RE | Admit: 2021-03-15 | Discharge: 2021-03-15 | Disposition: A | Discharge status: Home / Self Care | Payer: Medicare Other | Source: Ambulatory Visit | Attending: Family Medicine | Admitting: Family Medicine

## 2021-03-15 ENCOUNTER — Other Ambulatory Visit: Payer: Self-pay | Admitting: Family Medicine

## 2021-03-15 DIAGNOSIS — M25532 Pain in left wrist: Secondary | ICD-10-CM

## 2021-03-15 DIAGNOSIS — N950 Postmenopausal bleeding: Secondary | ICD-10-CM

## 2021-03-15 DIAGNOSIS — M25511 Pain in right shoulder: Secondary | ICD-10-CM

## 2021-03-16 ENCOUNTER — Telehealth (HOSPITAL_COMMUNITY): Payer: Self-pay | Admitting: Pharmacy Technician

## 2021-03-16 NOTE — Telephone Encounter (Signed)
Advanced Heart Failure Patient Advocate Encounter  Resent prescriber's portion of AZ&Me application via fax.  Will follow up.

## 2021-03-18 ENCOUNTER — Other Ambulatory Visit (HOSPITAL_COMMUNITY): Payer: Medicare Other

## 2021-03-21 ENCOUNTER — Ambulatory Visit (HOSPITAL_COMMUNITY)
Admission: RE | Admit: 2021-03-21 | Discharge: 2021-03-21 | Disposition: A | Discharge status: Home / Self Care | Payer: Medicare Other | Source: Ambulatory Visit | Attending: Cardiology | Admitting: Cardiology

## 2021-03-21 ENCOUNTER — Other Ambulatory Visit: Payer: Self-pay

## 2021-03-21 DIAGNOSIS — I5032 Chronic diastolic (congestive) heart failure: Secondary | ICD-10-CM | POA: Diagnosis not present

## 2021-03-21 LAB — BASIC METABOLIC PANEL
Anion gap: 11 (ref 5–15)
BUN: 37 mg/dL — ABNORMAL HIGH (ref 6–20)
CO2: 25 mmol/L (ref 22–32)
Calcium: 9 mg/dL (ref 8.9–10.3)
Chloride: 104 mmol/L (ref 98–111)
Creatinine, Ser: 2.25 mg/dL — ABNORMAL HIGH (ref 0.44–1.00)
GFR, Estimated: 25 mL/min — ABNORMAL LOW (ref >60)
Glucose, Bld: 111 mg/dL — ABNORMAL HIGH (ref 70–99)
Potassium: 3.8 mmol/L (ref 3.5–5.1)
Sodium: 140 mmol/L (ref 135–145)

## 2021-03-21 NOTE — Progress Notes (Signed)
[facility] PREP Weekly Session   Patient Details  Name: Dawn Fowler MRN: [id number] Date of Birth: 1966-01-12 Age: 55 y.o. PCP: Hayden Rasmussen, MD  Vitals:   03/21/21 1559  Weight: 245 lb 9.6 oz (111.4 kg)     [name] Weekly seesion - 03/21/21 1600      Weekly Session   Topic Discussed Hitting roadblocks   Reviewed goal and acitivity plan for end of program; survey   Minutes exercised this week --   1-2 hours   Classes attended to date 15    Comments --   reports bruising to low back, sore; advised to take it easy, do what she could do w/o pain           Roshana Shuffield B [place] 03/21/2021, 4:04 PM

## 2021-03-22 ENCOUNTER — Telehealth (HOSPITAL_COMMUNITY): Payer: Self-pay | Admitting: Pharmacy Technician

## 2021-03-22 NOTE — Telephone Encounter (Addendum)
Advanced Heart Failure Patient Advocate Encounter  Patient was approved to receive Farxiga from AZ&Me.  Patient ID: [id number] Effective dates: 03/15/21 through 10/29/21  Prescription is in progress and should be received 10-15 business days from the approval date.   Called and spoke with the patient.   [name], CPhT

## 2021-03-22 NOTE — Telephone Encounter (Addendum)
Advanced Heart Failure Patient Advocate Encounter  Sent in OOP to BMS via fax.  Advanced Heart Failure Patient Advocate Encounter  Called [employer] to check the status of the patient's application. Representative stated that the patients 3% OOP that is left to be met is $258.76.

## 2021-03-29 ENCOUNTER — Telehealth (HOSPITAL_COMMUNITY): Payer: Self-pay | Admitting: Licensed Clinical Social Worker

## 2021-03-29 NOTE — Telephone Encounter (Signed)
Pt called CSW to discuss current overdue electric bill- pt owes $365.6 overall but needs to pay $239.46 in order to avoid disconnection on 6/13.  CSW provided pt with list of local rent/utility assistance options and encouraged her to follow up as soon as possible and let us know if these do not work for her.  Will continue to follow and assist as needed  Jorge Ny, [facility] Desk#: [phone] Cell#: [phone]

## 2021-03-31 ENCOUNTER — Other Ambulatory Visit (HOSPITAL_COMMUNITY): Payer: Self-pay | Admitting: *Deleted

## 2021-03-31 ENCOUNTER — Other Ambulatory Visit: Payer: Self-pay | Admitting: Obstetrics & Gynecology

## 2021-03-31 DIAGNOSIS — Z1231 Encounter for screening mammogram for malignant neoplasm of breast: Secondary | ICD-10-CM

## 2021-04-04 NOTE — Progress Notes (Signed)
[facility] PREP Weekly Session   Patient Details  Name: Dawn Fowler MRN: [id number] Date of Birth: 16-Sep-1966 Age: 55 y.o. PCP: Hayden Rasmussen, MD  Vitals:   04/04/21 1558  Weight: 246 lb 12.8 oz (111.9 kg)     [name] Weekly seesion - 04/04/21 1500      Weekly Session   Topic Discussed --   Fit testing completed; discussed goals and activity plan, survey for final assessment visit Wednesday   Minutes exercised this week 75 minutes    Classes attended to date [place] 04/04/2021, 3:59 PM

## 2021-04-05 ENCOUNTER — Ambulatory Visit
Admission: RE | Admit: 2021-04-05 | Discharge: 2021-04-05 | Disposition: A | Discharge status: Home / Self Care | Payer: Medicare Other | Source: Ambulatory Visit

## 2021-04-05 ENCOUNTER — Other Ambulatory Visit: Payer: Self-pay

## 2021-04-05 DIAGNOSIS — Z1231 Encounter for screening mammogram for malignant neoplasm of breast: Secondary | ICD-10-CM

## 2021-04-06 NOTE — Progress Notes (Signed)
[place] Report   Patient Details  Name: Dawn Fowler MRN: [id number] Date of Birth: [birth year] Age: 55 y.o. PCP: Hayden Rasmussen, MD  Vitals:   04/06/21 1445  BP: 130/70  Pulse: 86  SpO2: 96%  Weight: 247 lb (112 kg)      [name] Eval - 04/06/21 1400      Referral    Referring Provider [facility] Start Date 04/06/21   final date of PREP     Measurement   Waist Circumference 53.5 inches    Hip Circumference 57.5 inches    Body fat --   too high to read     Information for Trainer   Goals --   Reviewed goals to continue exercising. Wants to have more energey and be more active     Mobility and Daily Activities   I find it easy to walk up or down two or more flights of stairs. 1    I have no trouble taking out the trash. 2    I do housework such as vacuuming and dusting on my own without difficulty. 2    I can easily lift a gallon of milk (8lbs). 3    I can easily walk a mile. 1    I have no trouble reaching into high cupboards or reaching down to pick up something from the floor. 2    I do not have trouble doing out-door work such as Armed forces logistics/support/administrative officer, raking leaves, or gardening. 1      Mobility and Daily Activities   I feel younger than my age. 2    I feel independent. 4    I feel energetic. 2    I live an active life.  1    I feel strong. 2    I feel healthy. 2    I feel active as other people my age. 1      How fit and strong are you.   Fit and Strong Total Score 26          Past Medical History:  Diagnosis Date  . Acute respiratory failure due to COVID-19 ([place]) 10/30/2019  . Arthritis   . CHF (congestive heart failure) ([place])   . CKD (chronic kidney disease), stage III ([place])   . Complication of anesthesia    hard to wake up after general anesthesia - had trouble waking up after succinylcholine (consider possibility of pseudocholinesterase deficiency)  . COVID-19 virus detected 11/20/2019   Tested positive for Covid in  December/2020 Required hospitalization in December/2020 through January/2021 Received Redemsivir  . Dyspnea   . Family history of adverse reaction to anesthesia    hard to wake up after general anesthesia  . GERD (gastroesophageal reflux disease)   . HLD (hyperlipidemia)   . HTN (hypertension)   . Pre-diabetes    no meds  . Pulmonary HTN ([place])   . Sleep apnea    on CPAP   Past Surgical History:  Procedure Laterality Date  . CARPAL TUNNEL RELEASE Bilateral   . CESAREAN SECTION    . COLONOSCOPY WITH PROPOFOL N/A 03/12/2020   Procedure: COLONOSCOPY WITH PROPOFOL;  Surgeon: Carol Ada, MD;  Location: [facility];  Service: Endoscopy;  Laterality: N/A;  . DILATATION & CURETTAGE/HYSTEROSCOPY WITH MYOSURE N/A 04/08/2020   Procedure: [place];  Surgeon: Princess Bruins, MD;  Location: [facility];  Service: Gynecology;  Laterality: N/A;  request 8:30am OR start-requested through [place] for [facility]  Gyn requests one hour  . HEMOSTASIS CLIP PLACEMENT  03/12/2020   Procedure: HEMOSTASIS CLIP PLACEMENT;  Surgeon: Carol Ada, MD;  Location: [facility];  Service: Endoscopy;;  . LEG SURGERY Right 1988   metal rod  . lumbar surgery    . POLYPECTOMY  03/12/2020   Procedure: POLYPECTOMY;  Surgeon: Carol Ada, MD;  Location: [facility];  Service: Endoscopy;;  . RIGHT HEART CATH N/A 12/18/2019   Procedure: RIGHT HEART CATH;  Surgeon: Larey Dresser, MD;  Location: [place] CV LAB;  Service: Cardiovascular;  Laterality: N/A;  . RIGHT HEART CATH N/A 07/12/2020   Procedure: RIGHT HEART CATH;  Surgeon: Larey Dresser, MD;  Location: [place] CV LAB;  Service: Cardiovascular;  Laterality: N/A;   Social History   Tobacco Use  Smoking Status Former Smoker  Smokeless Tobacco Never Used  Tobacco Comment   only smoked for fun in highschool not even one a day   Fit testing: Cardio march: 156 to 192 Sit to stand: 10 to 12 Bicep curls: 16 to  16  Balance did not change. Encouraged to keep exercising at home and working on balance. Given exercises to do.  Excited to have a friend move in around Sept and has a job Catering manager.  Admits to low motivation. Encourage to work on [employer] for getting up in the morning-mindset.      Barnett Hatter 04/06/2021, 2:49 PM

## 2021-04-19 ENCOUNTER — Telehealth (HOSPITAL_COMMUNITY): Payer: Self-pay | Admitting: *Deleted

## 2021-04-19 NOTE — Telephone Encounter (Signed)
Pt left vm stating she ate mcdonalds and then her head started hurting, she became sweaty, and felt like she was going to vomit. I called pt back to get more information no answer/left vm requesting return call.

## 2021-04-28 ENCOUNTER — Ambulatory Visit: Payer: Medicare Other | Admitting: Obstetrics & Gynecology

## 2021-06-06 ENCOUNTER — Telehealth (HOSPITAL_COMMUNITY): Payer: Self-pay | Admitting: Pharmacy Technician

## 2021-06-06 NOTE — Telephone Encounter (Signed)
Advanced Heart Failure Patient Advocate Encounter  Spoke with patient. She wanted to place a refill for Opsumit, needed phone number to the pharmacy.  Provided [facility] pharmacy phone number [phone] and [facility] phone [phone].  [name], CPhT

## 2021-06-06 NOTE — Telephone Encounter (Signed)
Advanced Heart Failure Patient Advocate Encounter  I received a voicemail from the patient requesting a return call from the pharmacy team. Called patient, no answer. Voicemail was also full.  [name], CPhT

## 2021-06-13 ENCOUNTER — Ambulatory Visit (HOSPITAL_COMMUNITY)
Admission: RE | Admit: 2021-06-13 | Discharge: 2021-06-13 | Disposition: A | Discharge status: Home / Self Care | Payer: Medicare Other | Source: Ambulatory Visit | Attending: Cardiology | Admitting: Cardiology

## 2021-06-13 ENCOUNTER — Other Ambulatory Visit: Payer: Self-pay

## 2021-06-13 ENCOUNTER — Encounter (HOSPITAL_COMMUNITY): Payer: Self-pay | Admitting: Cardiology

## 2021-06-13 VITALS — BP 128/80 | HR 77 | Wt 257.4 lb

## 2021-06-13 DIAGNOSIS — N183 Chronic kidney disease, stage 3 unspecified: Secondary | ICD-10-CM | POA: Diagnosis not present

## 2021-06-13 DIAGNOSIS — I5032 Chronic diastolic (congestive) heart failure: Secondary | ICD-10-CM | POA: Insufficient documentation

## 2021-06-13 DIAGNOSIS — I82501 Chronic embolism and thrombosis of unspecified deep veins of right lower extremity: Secondary | ICD-10-CM | POA: Diagnosis not present

## 2021-06-13 DIAGNOSIS — Z9981 Dependence on supplemental oxygen: Secondary | ICD-10-CM | POA: Diagnosis not present

## 2021-06-13 DIAGNOSIS — G4733 Obstructive sleep apnea (adult) (pediatric): Secondary | ICD-10-CM | POA: Diagnosis not present

## 2021-06-13 DIAGNOSIS — Z87891 Personal history of nicotine dependence: Secondary | ICD-10-CM | POA: Diagnosis not present

## 2021-06-13 DIAGNOSIS — Z7951 Long term (current) use of inhaled steroids: Secondary | ICD-10-CM | POA: Insufficient documentation

## 2021-06-13 DIAGNOSIS — Z596 Low income: Secondary | ICD-10-CM | POA: Diagnosis not present

## 2021-06-13 DIAGNOSIS — Z8249 Family history of ischemic heart disease and other diseases of the circulatory system: Secondary | ICD-10-CM | POA: Diagnosis not present

## 2021-06-13 DIAGNOSIS — Z8616 Personal history of COVID-19: Secondary | ICD-10-CM | POA: Insufficient documentation

## 2021-06-13 DIAGNOSIS — Z5941 Food insecurity: Secondary | ICD-10-CM | POA: Diagnosis not present

## 2021-06-13 DIAGNOSIS — I2721 Secondary pulmonary arterial hypertension: Secondary | ICD-10-CM | POA: Insufficient documentation

## 2021-06-13 DIAGNOSIS — Z79899 Other long term (current) drug therapy: Secondary | ICD-10-CM | POA: Diagnosis not present

## 2021-06-13 DIAGNOSIS — Z7901 Long term (current) use of anticoagulants: Secondary | ICD-10-CM | POA: Insufficient documentation

## 2021-06-13 DIAGNOSIS — Z888 Allergy status to other drugs, medicaments and biological substances status: Secondary | ICD-10-CM | POA: Insufficient documentation

## 2021-06-13 DIAGNOSIS — I130 Hypertensive heart and chronic kidney disease with heart failure and stage 1 through stage 4 chronic kidney disease, or unspecified chronic kidney disease: Secondary | ICD-10-CM | POA: Insufficient documentation

## 2021-06-13 DIAGNOSIS — Z7984 Long term (current) use of oral hypoglycemic drugs: Secondary | ICD-10-CM | POA: Diagnosis not present

## 2021-06-13 DIAGNOSIS — I272 Pulmonary hypertension, unspecified: Secondary | ICD-10-CM

## 2021-06-13 LAB — BASIC METABOLIC PANEL
Anion gap: 7 (ref 5–15)
BUN: 26 mg/dL — ABNORMAL HIGH (ref 6–20)
CO2: 20 mmol/L — ABNORMAL LOW (ref 22–32)
Calcium: 9.4 mg/dL (ref 8.9–10.3)
Chloride: 111 mmol/L (ref 98–111)
Creatinine, Ser: 1.88 mg/dL — ABNORMAL HIGH (ref 0.44–1.00)
GFR, Estimated: 31 mL/min — ABNORMAL LOW (ref >60)
Glucose, Bld: 95 mg/dL (ref 70–99)
Potassium: 4.4 mmol/L (ref 3.5–5.1)
Sodium: 138 mmol/L (ref 135–145)

## 2021-06-13 LAB — BRAIN NATRIURETIC PEPTIDE: B Natriuretic Peptide: 198.3 pg/mL — ABNORMAL HIGH (ref 0.0–100.0)

## 2021-06-13 IMAGING — US US RENAL
1 series · 14 of 25 positions shown · non-contrast
Comparison: None.

CLINICAL DATA: 54-year-old female with stage III B chronic kidney
disease.

EXAM:
RENAL / URINARY TRACT ULTRASOUND COMPLETE

[Series 1: us renal · 0.25mm/px · 14 of 33 slices shown]
[im 1/33]
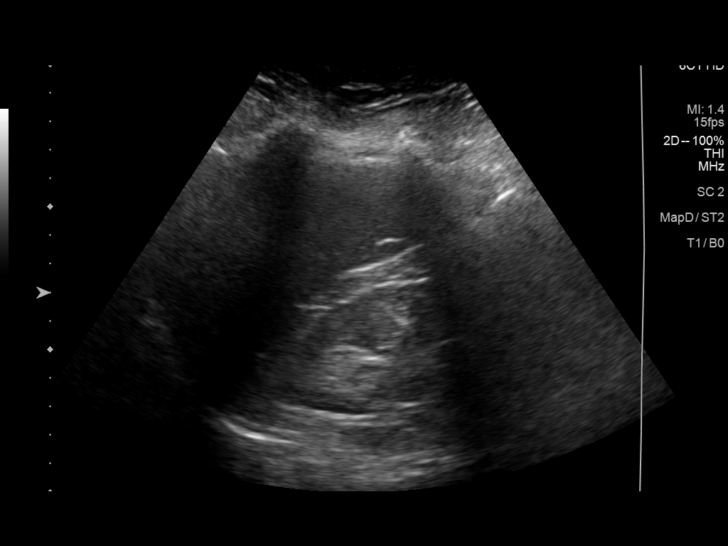
[im 3/33]
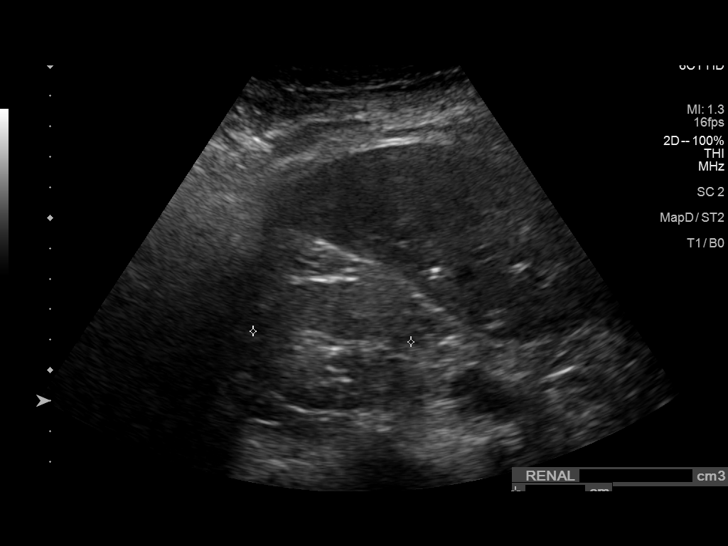
[im 6/33]
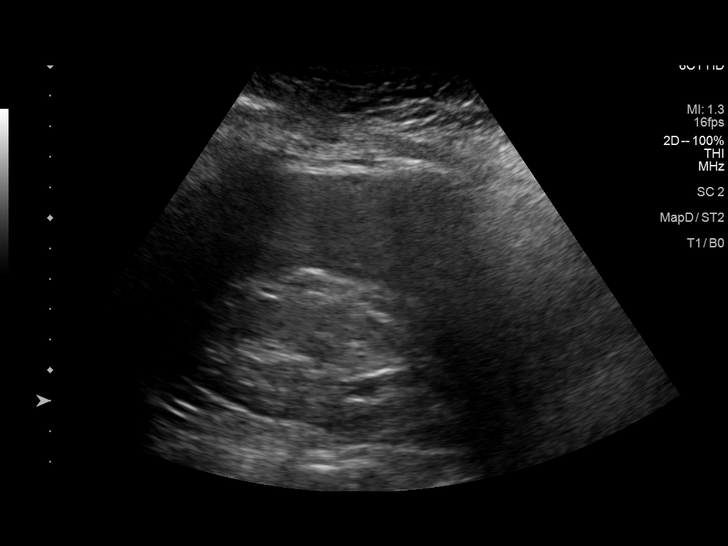
[im 9/33]
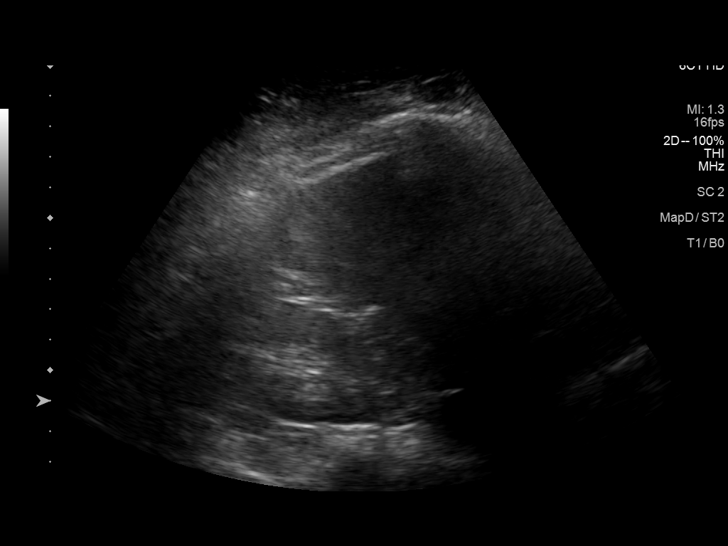
[im 11/33]
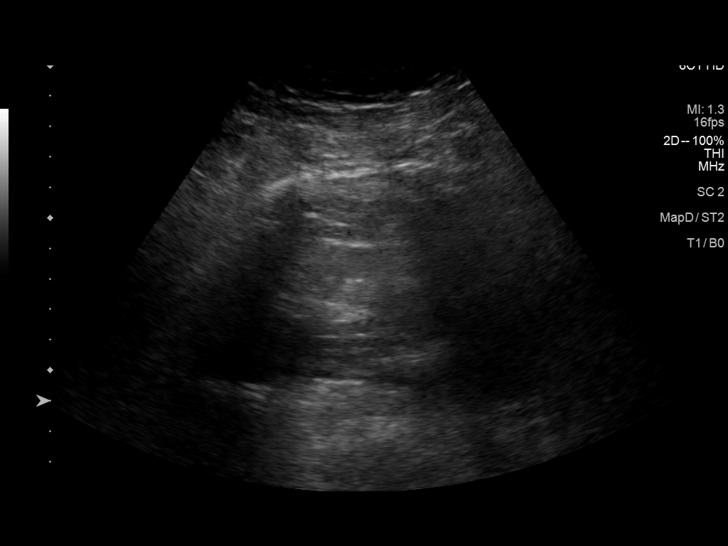
[im 13/33]
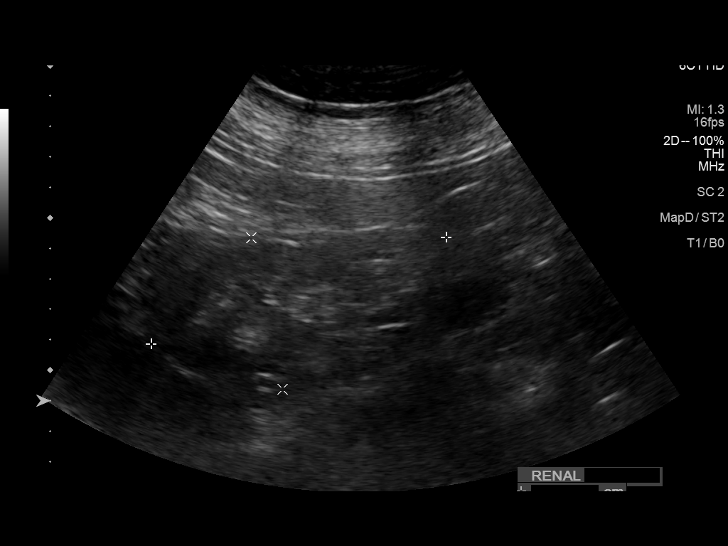
[im 15/33]
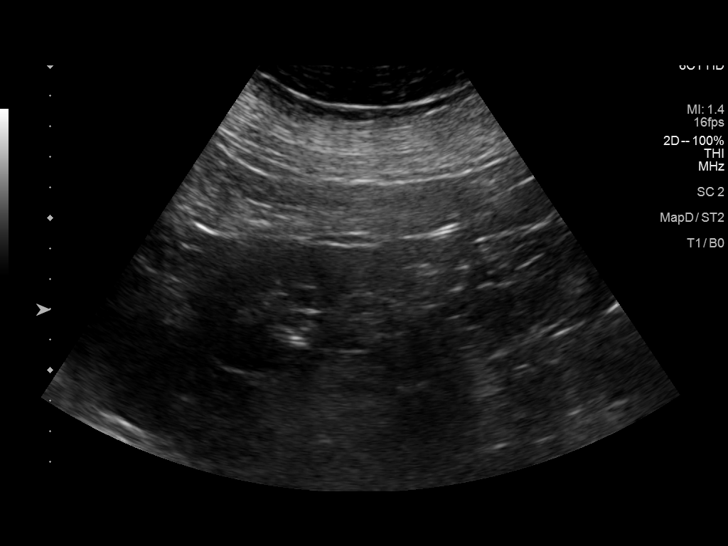
[im 18/33]
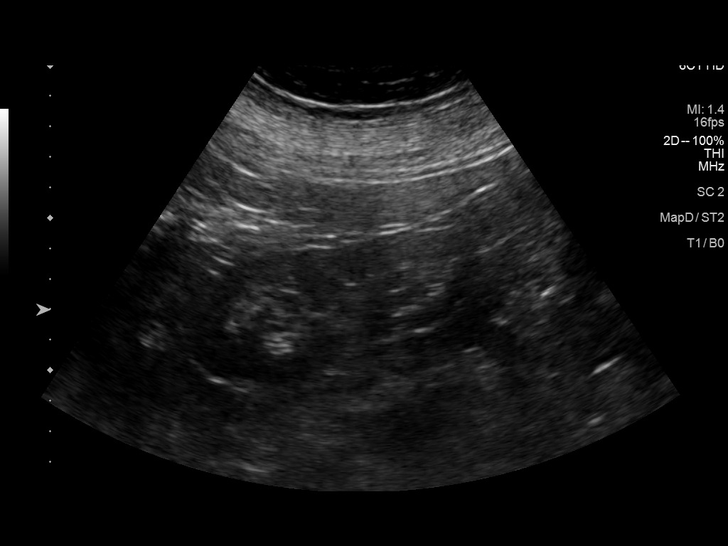
[im 21/33]
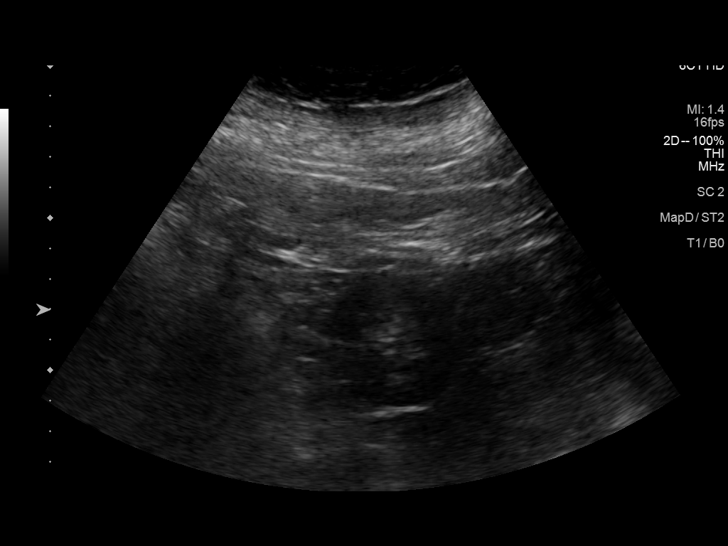
[im 22/33]
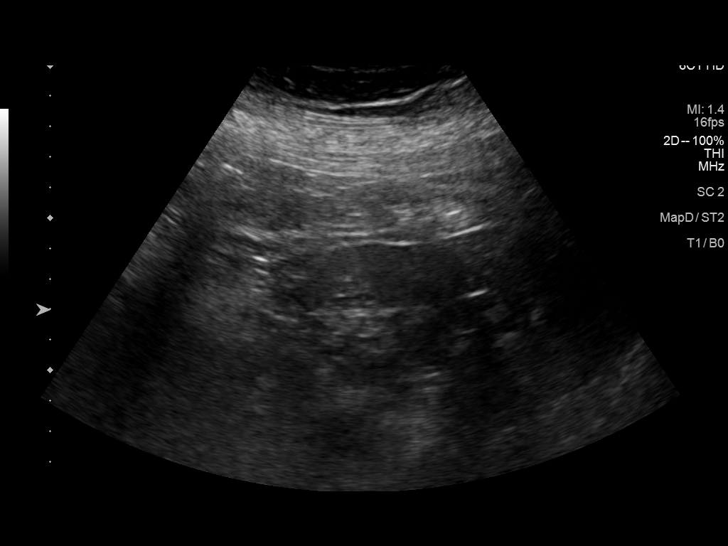
[im 25/33]
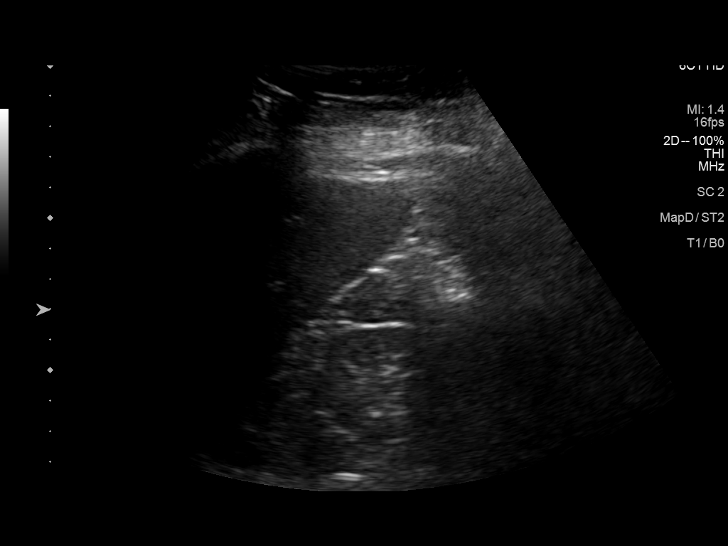
[im 27/33]
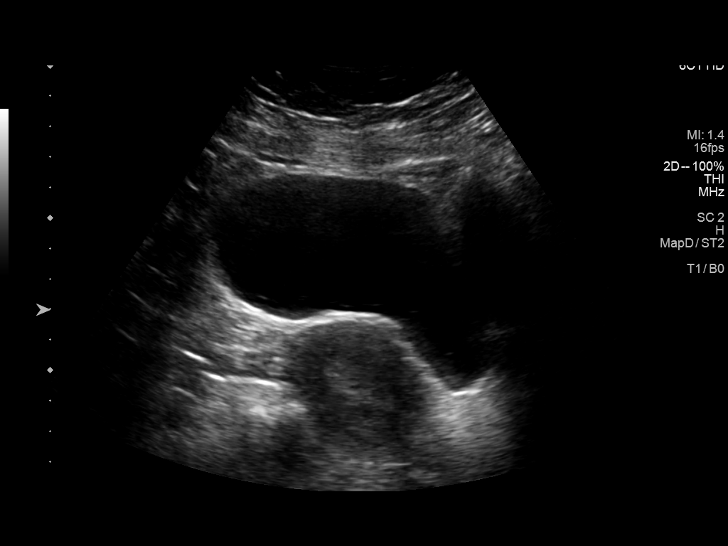
[im 30/33]
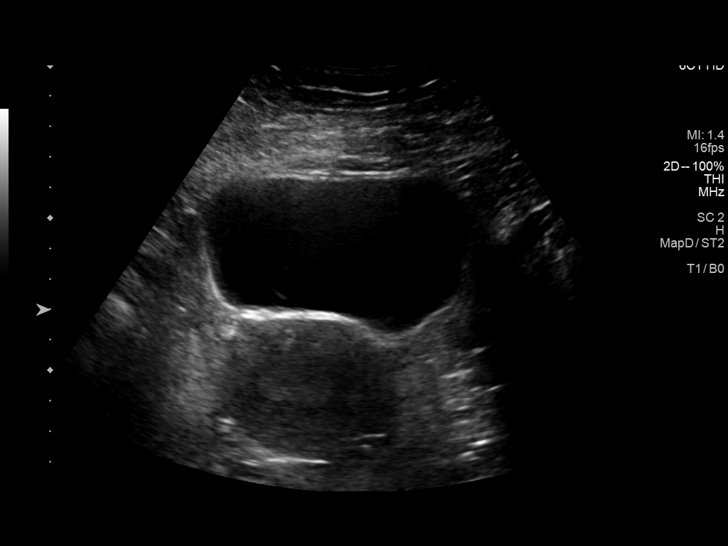
[im 33/33]
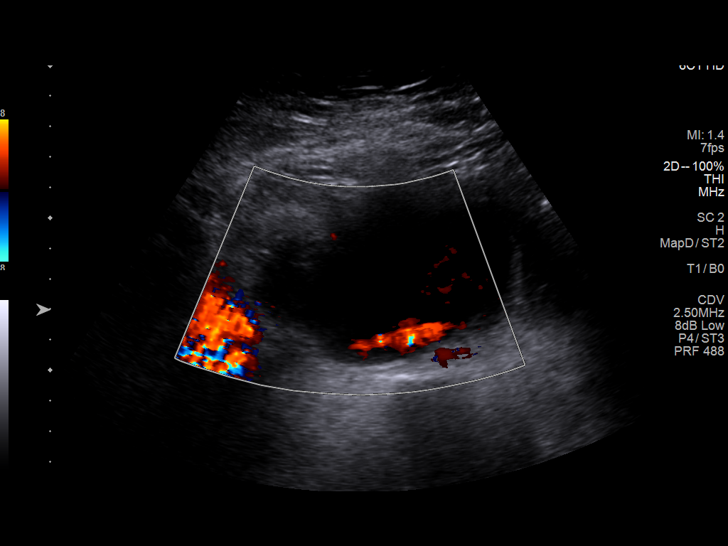

[14 of 25 positions shown; findings below may reference images not displayed]

FINDINGS: Right Kidney:

Renal measurements: 9.9 x 4.0 x 5.2 cm = volume: 107 mL. Mildly
echogenic right renal cortex (image 2). No right hydronephrosis or
renal lesion.

Left Kidney:

Renal measurements: 10.3 x 5.1 x 5.2 cm = volume: 142 mL. Mildly
echogenic left kidney similar to the right (image 14). No left
hydronephrosis or renal lesion.

Bladder:

Appears normal for degree of bladder distention. Both ureteral jets
detected with Doppler.

Other:

None.
IMPRESSION: Mildly echogenic kidneys compatible with chronic medical renal
disease.

## 2021-06-13 MED ORDER — TORSEMIDE 20 MG PO TABS
ORAL_TABLET | ORAL | 11 refills | Status: DC
Start: 2021-06-13 — End: 2021-06-13

## 2021-06-13 MED ORDER — TORSEMIDE 20 MG PO TABS: ORAL_TABLET | ORAL | 11 refills | Status: DC

## 2021-06-13 NOTE — Patient Instructions (Addendum)
EKG done today.  Labs done today. We will contact you only if your labs are abnormal.  INCREASE Torsemide to 40mg  (2 tablets) by mouth 2 times daily for 3 days THEN DECREASE back down to 20mg  (1 tablet) by mouth every morning and in 40mg  (2 tablets) by mouth daily.   No other medication changes were made. Please continue all current medications as prescribed.  Your physician recommends that you schedule a follow-up appointment in: 2 weeks with our [facility] here in our office.  If you have any questions or concerns before your next appointment please send [place] a message through [place] or call our office at [phone].    TO LEAVE A MESSAGE FOR THE NURSE SELECT OPTION 2, PLEASE LEAVE A MESSAGE INCLUDING: YOUR NAME DATE OF BIRTH CALL BACK NUMBER REASON FOR CALL**this is important as we prioritize the call backs  YOU WILL RECEIVE A CALL BACK THE SAME DAY AS LONG AS YOU CALL BEFORE 4:00 PM   Do the following things EVERYDAY: Weigh yourself in the morning before breakfast. Write it down and keep it in a log. Take your medicines as prescribed Eat low salt foods--Limit salt (sodium) to 2000 mg per day.  Stay as active as you can everyday Limit all fluids for the day to less than 2 liters   At the [facility], you and your health needs are our priority. As part of our continuing mission to provide you with exceptional heart care, we have created designated Provider Care Teams. These Care Teams include your primary Cardiologist (physician) and Advanced Practice Providers (APPs- Physician Assistants and Nurse Practitioners) who all work together to provide you with the care you need, when you need it.   You may see any of the following providers on your designated Care Team at your next follow up: Dr Glori Bickers Dr Haynes Kerns, NP Lyda Jester, [place] Audry Riles, PharmD   Please be sure to bring in all your medications bottles to every appointment.

## 2021-06-14 NOTE — Progress Notes (Signed)
ID:  Dawn Fowler, DOB 1965-12-20, MRN [id number]   Provider location: [place] Advanced Heart Failure Type of Visit: Established patient   PCP:  Hayden Rasmussen, MD  Cardiologist:  Dr. Aundra Dubin   History of Present Illness: Dawn Fowler is a 55 y.o. 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  She is on CPAP at home and 6L home oxygen (says she has been on oxygen for "years.").  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  She additionally has significant HTN.  She had been on Opsumit 10 + tadalafil 20 mg daily but ran out of this prior to her 2/21 admission. She also has a history of prior DVT after long train ride, has had 2 negative V/Q scans.  She was hospitalized for COVID-19 at [facility] in 1/21.    She was seen by [facility] Pulmonology after discharge from [place] admission and was noted to be volume overloaded, Lasix was changed to torsemide but she was unable to get to pharmacy to pick up. Subsequently, she developed progressive wt gain, nearly 20 lb and increased dyspnea, prompting her to report back to the ED in 2/21, where she was found to be in acute on chronic diastolic HF and readmitted for IV diuresis and AHF consultation. Echo in 2/21 showed LVEF 55-60%, moderate RV dilation/moderate RV dysfunction. [place] 2/21 showed elevated left and right heart filling pressures with severe pulmonary hypertension. PFTs completed showing severe restriction and severely decreased DLCO. High resolution CT was concerning  for small vessel disease, no ILD, and concerning for PAH.  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 She was continued on IV diuretics and had good response, diuresing 32 lb. She was transitioned  to torsemide 40 mg/20 mg. Opsumit and tadalafil restarted.   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  She is on Eliquis. RHC in 9/21 showed normal RA pressure and PCWP, moderate-severe pulmonary arterial hypertension.   Echo 5/22 showed EF 55% with mildly enlarged RV with mild RV dysfunction, mildly D-shaped septum, PASP 48 with normal IVC.   She presents today for followup of CHF/RV failure and pulmonary hypertension. She continues to use home oxygen and CPAP.  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 She has had increased dyspnea and chest tightness with housework like cleaning bathroom, cooking, laundary.  She is short of breath walking about 50 feet.  She uses a walker.  No lightheadedness/syncope.  Of note, she has not been taking torsemide for about 2 wks (ran out).    ECG (personally reviewed): NSR, normal  Labs (6/21): K 4.5, creatinine 2.09 Labs (8/21): K 4.3, creatinine 2.11 Labs (9/21): K 3.9, creatinine 2.0 Labs (10/21): BNP 58 Labs (12/21): K 3.8, creatinine 2.28 Labs (1/22): BNP 160, K 3.7, creatinine 1.79 Labs (3/22): BNP 15.7, K 3.9, creatinine 2.33 Labs (5/22): K 3.8, creatinine 2.25, BNP 117  6 minute walk (9/21): 168 m 6 minute walk (10/21): 122 m 6 minute walk (3/22): 107 m  PMH: 1. OHS/OSA: Uses home oxygen.  2. CKD stage IV 3. COVID-19 PNA in 12/20 4. Restrictive lung disease: PFTs (2/21) with  severe restriction, severely decreased DLCO.  - High resolution CT chest (2/21): no interstitial lung disease, "small vessel disease."  5. Gout 6. HTN 7. DVT: Remote, after train ride.  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  8. Pulmonary hypertension: RHC (2/21) with mean RA 14, PA 89/33 mean 54, mean PCWP 34, CI 2.22, PVR 3.87 WU => mixed pulmonary arterial/pulmonary venous hypertension.  - V/Q scan (2/21): No evidence for chronic PE.  - Echo (2/21): EF 55-60%, moderately dilated RV with  moderately decreased systolic function.  - HIV and rheumatologic serologies negative.  - RHC (9/21): mean RA 9, PA 68/23 mean 40, mean PCWP 11, PVR 5.6 WU, no step up in oxygen saturation so no evidence for left to right shunting.  - Echo (5/22): EF 55% with mildly enlarged RV with mild RV dysfunction, mildly D-shaped septum, PASP 48 with normal IVC. 9. Depression   Current Outpatient Medications  Medication Sig Dispense Refill   acetaminophen (TYLENOL) 500 MG tablet Take 1,000 mg by mouth every 6 (six) hours as needed for moderate pain or headache.     allopurinol (ZYLOPRIM) 100 MG tablet Take 100 mg by mouth daily.     apixaban (ELIQUIS) 5 MG TABS tablet Take 1 tablet (5 mg total) by mouth 2 (two) times daily. 60 tablet 3   Calcium Carbonate Antacid (TUMS E-X PO) Take 1 tablet by mouth as needed.     Cholecalciferol (VITAMIN D3) 125 MCG (5000 UT) CAPS Take 1 capsule by mouth daily.     colchicine 0.6 MG tablet Take 0.6 mg by mouth daily as needed (gout flare).     dapagliflozin propanediol (FARXIGA) 10 MG TABS tablet Take 1 tablet (10 mg total) by mouth daily before breakfast. 30 tablet 11   diphenhydramine-acetaminophen (TYLENOL PM) 25-500 MG TABS tablet Take 2 tablets by mouth at bedtime as needed (sleep).     fluticasone furoate-vilanterol (BREO ELLIPTA) 200-25 MCG/INH AEPB Inhale 1 puff into the lungs daily. 60 each 5   gabapentin (NEURONTIN) 100 MG capsule Take 300 mg by mouth at bedtime.     ipratropium-albuterol (DUONEB) 0.5-2.5 (3) MG/3ML SOLN Take 3 mLs by nebulization.     macitentan (OPSUMIT) 10 MG tablet Take 1 tablet (10 mg total) by mouth daily. 30 tablet 11   OXYGEN Inhale 4-8 L into the lungs continuous.     pantoprazole (PROTONIX) 40 MG tablet Take 40 mg by mouth daily.     potassium chloride SA (KLOR-CON) 20 MEQ tablet Take 20 mEq by mouth daily.      Selexipag (UPTRAVI) 800 MCG TABS Take 1 tablet (800 mcg total) by mouth 2 (two) times daily. 60 tablet 11   sertraline  (ZOLOFT) 25 MG tablet Take 1 tablet (25 mg total) by mouth daily. 30 tablet 2   Simethicone (GAS-X PO) Take 1 tablet by mouth as needed.     spironolactone (ALDACTONE) 25 MG tablet TAKE 1/2 TABLET(12.5 MG) BY MOUTH DAILY 45 tablet 3   tadalafil, PAH, (ADCIRCA) 20 MG tablet Take 2 tablets (40 mg total) by mouth daily. 60 tablet 5   torsemide (DEMADEX) 20 MG tablet Take 1 tablet (20 mg total) by mouth every morning AND 2 tablets (40 mg total) every evening. 90 tablet 11   traMADol (ULTRAM) 50 MG tablet Take 50 mg by mouth every 6 (six) hours as needed for moderate pain or severe pain.  (Patient not taking: Reported on 06/13/2021)     No current facility-administered  medications for this encounter.    Allergies  Allergen Reactions   Adhesive [Tape] Hives    Tolerates paper tape   Succinylcholine Other (See Comments)    Have trouble waking up   Tizanidine Hives      Social History   Socioeconomic History   Marital status: Single    Spouse name: Not on file   Number of children: Not on file   Years of education: Not on file   Highest education level: Not on file  Occupational History   Not on file  Tobacco Use   Smoking status: Former   Smokeless tobacco: Never   Tobacco comments:    only smoked for fun in highschool not even one a day  Vaping Use   Vaping Use: Never used  Substance and Sexual Activity   Alcohol use: Yes    Comment: occ   Drug use: Not Currently   Sexual activity: Yes    Partners: Male    Comment: 1st intercourse- 28, partners- 65, current partner- 10 yrs   Other Topics Concern   Not on file  Social History Narrative   Not on file   Social Determinants of Health   Financial Resource Strain: High Risk   Difficulty of Paying Living Expenses: Very hard  Food Insecurity: Food Insecurity Present   Worried About Charity fundraiser in the Last Year: Sometimes true   Ran Out of Food in the Last Year: Never true  Transportation Needs: No Transportation Needs    Lack of Transportation (Medical): No   Lack of Transportation (Non-Medical): No  Physical Activity: Not on file  Stress: Not on file  Social Connections: Not on file  Intimate Partner Violence: Not on file      Family History  Problem Relation Age of Onset   COPD Father    Cancer Mother        gallbladder and bladder    Hypertension Brother    Breast cancer Other    Diabetes Son    Breast cancer Cousin    Breast cancer Cousin    Pulmonary Hypertension Neg Hx     Vitals:   06/13/21 1411  BP: 128/80  Pulse: 77  SpO2: 96%  Weight: 116.8 kg (257 lb 6.4 oz)   Wt Readings from Last 3 Encounters:  06/13/21 116.8 kg (257 lb 6.4 oz)  04/06/21 112 kg (247 lb)  04/04/21 111.9 kg (246 lb 12.8 oz)   Exam:   BP 128/80   Pulse 77   Wt 116.8 kg (257 lb 6.4 oz)   LMP  (LMP Unknown) Comment: pmb last 2-3 months  SpO2 96% Comment: 4L n/c  BMI 48.64 kg/m  General: NAD Neck: JVP 10-11 cm, no thyromegaly or thyroid nodule.  Lungs: Clear to auscultation bilaterally with normal respiratory effort. CV: Nondisplaced PMI.  Heart regular S1/S2 with widely split S2, no S3/S4, no murmur.  Trace ankle edema.  No carotid bruit.  Normal pedal pulses.  Abdomen: Soft, nontender, no hepatosplenomegaly, no distention.  Skin: Intact without lesions or rashes.  Neurologic: Alert and oriented x 3.  Psych: Normal affect. Extremities: No clubbing or cyanosis.  HEENT: Normal.   ASSESSMENT & PLAN:  1. Chronic diastolic CHF with significant RV dysfunction: Echo in 2/21 with LVEF 55-60%, moderate RV dilation/moderate RV dysfunction.  [place] 2/18 showed elevated left and right heart filling pressures with severe pulmonary hypertension.  Suspect mixed pulmonary venous/pulmonary arterial hypertension in setting of significant LV diastolic dysfunction (PVR 3.87  WU).  Repeat RHC in 9/21 with moderate-severe PAH, normal filling pressures, and PVR 5.6 WU.  No evidence for left=>right shunting.  Echo in 5/22 showed  EF 55% with mildly enlarged RV with mild RV dysfunction, mildly D-shaped septum, PASP 48 with normal IVC.  NYHA class III symptoms today, worse than prior.  She is volume overloaded on exam and weight is up 8 lbs. I suspect CHF exacerbation was triggered by missing torsemide for about 2 wks.   - Restart torsemide at 40 mg bid x 3 days then 20 qam/40 qpm (takes higher dose in afternoon due to work),  BMET/BNP today and BMET in 10 days.   - Continue Farxiga 10 mg daily.  2. Pulmonary hypertension: Suspect that this is a mixed picture with group 2 and group 3 PH (related to diastolic LV dysfunction and OHS/OSA).  However, concerned for component of group 1 PAH as well.  V/Q scan not suggestive of chronic PEs and high resolution CT chest in 2/21 was not suggestive of ILD. HIV and rheumatologic serologies negative.  RHC showed severe mixed pulmonary venous/pulmonary arterial hypertension, repeat RHC after diuresis in 9/21 showed PAH. PFTs completed in 2/21 showed severe restriction, severely decreased DLCO (?restriction due to body habitus).  Echo in 5/22 showed mild improvement in RV and improvement in estimated PA systolic pressure.  She is on Opsumit, tadalafil and Uptravi 800 mcg bid.   - 6 minute walk next appointment => will not get today with significant volume overload.  - Continue to maintain oxygen saturation with home O2 and CPAP. Weight loss also would be helpful.  - Continue Opsumit 10 mg daily. - Continue tadalafil 40 mg daily.  - She is tolerating Uptravi 800 mcg bid now, I will have her continue to increase slowly over time once volume status is better-controlled. .      - Check BNP today 3. HTN: BP controlled on current regimen.  4. CKD stage 3 - BMET today.  - Followup with nephrology.  5. OHS/OSA: compliant w/ CPAP and home oxygen.  6. 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  As she is at baseline sedentary and has had recurrent DVTs, would continue Eliquis long-term.     Followup in 2 wks with NP/PA to reassess volume.     Signed, [name], MD  06/14/2021  [facility] [address] [facility] and [place] [address] [phone] (office) [phone] (fax)

## 2021-06-20 ENCOUNTER — Telehealth: Payer: Self-pay

## 2021-06-20 NOTE — Telephone Encounter (Signed)
-----   Message from [place], MD sent at 06/14/2021  1:05 PM EDT ----- Regarding: Follow-up This patient was on recall but not scheduled. Please schedule for routine follow-up with me for her OSA and PH

## 2021-06-20 NOTE — Telephone Encounter (Signed)
Called and lvm in regards to scheduling pt for an overdue appointment, gave patient a call back number.

## 2021-06-21 ENCOUNTER — Telehealth: Payer: Self-pay | Admitting: Pulmonary Disease

## 2021-06-21 NOTE — Telephone Encounter (Signed)
Follow-up Received: 1 week ago Margaretha Seeds, MD  P [facility] Triage Pool This patient was on recall but not scheduled.  Please schedule for routine follow-up with me for her OSA and PH    Attempted to call pt but unable to reach. Left pt a message for her to return call.  When pt returns call, please schedule pt a f/u with Dr. Loanne Drilling at her next avail. Routing to front desk pool to help get f/u scheduled.

## 2021-06-25 NOTE — Progress Notes (Signed)
ID:  Dawn Fowler, DOB 09-19-1966, MRN [id number]   Provider location: [facility] Type of Visit: Established patient   PCP:  Hayden Rasmussen, MD  Cardiologist:  Dr. Aundra Dubin   History of Present Illness: Dawn Fowler is a 55 y.o. 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  She is on CPAP at home and 6L home oxygen (says she has been on oxygen for "years.").  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  She additionally has significant HTN.  She had been on Opsumit 10 + tadalafil 20 mg daily but ran out of this prior to her 2/21 admission. She also has a history of prior DVT after long train ride, has had 2 negative V/Q scans.  She was hospitalized for COVID-19 at [facility] in 1/21.    She was seen by [facility] after discharge from [place] admission and was noted to be volume overloaded, Lasix was changed to torsemide but she was unable to get to pharmacy to pick up. Subsequently, she developed progressive wt gain, nearly 20 lb and increased dyspnea, prompting her to report back to the ED in 2/21, where she was found to be in acute on chronic diastolic HF and readmitted for IV diuresis and AHF consultation. Echo in 2/21 showed LVEF 55-60%, moderate RV dilation/moderate RV dysfunction. [place] 2/21 showed elevated left and right heart filling pressures with severe pulmonary hypertension. PFTs completed showing severe restriction and severely decreased DLCO. High resolution CT was concerning  for small vessel disease, no ILD, and concerning for PAH.  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 She was continued on IV diuretics and had good response, diuresing 32 lb. She was transitioned  to torsemide 40 mg/20 mg. Opsumit and tadalafil restarted.   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  She is on Eliquis. RHC in 9/21 showed normal RA pressure and PCWP, moderate-severe pulmonary arterial hypertension.   Echo 5/22 showed EF 55% with mildly enlarged RV with mild RV dysfunction, mildly D-shaped septum, PASP 48 with normal IVC.   Today she returns for HF follow up. She is down almost 10 lbs since last visit after re-starting her torsemide. She continues to use home oxygen and CPAP. No longer SOB walking on flat ground but is dyspneic with house-keeping. Her main complaint today is her trigger finger pain and left knee pain. Denies CP, dizziness, edema, or PND/Orthopnea. Appetite ok. No fever or chills. She has not been weighing at home. Taking all medications. She is working part time at a call center. She thinks she may a UTI/yeast infection. Her last menstrual period was 4 months ago, asking to for pregnancy test.  ECG (personally reviewed): none ordered today.  Labs (6/21): K 4.5, creatinine 2.09 Labs (8/21): K 4.3, creatinine 2.11 Labs (9/21): K 3.9, creatinine 2.0 Labs (10/21): BNP 58 Labs (12/21): K 3.8, creatinine 2.28 Labs (1/22): BNP 160, K 3.7, creatinine 1.79 Labs (3/22): BNP 15.7, K 3.9, creatinine 2.33 Labs (5/22): K 3.8, creatinine 2.25, BNP 117 Labs (8/22): K 4.4, creatinine 1.88  6 minute walk (9/21): 168 m 6 minute walk (10/21): 122 m 6 minute walk (  3/22): 71 m  PMH: 1. OHS/OSA: Uses home oxygen.  2. CKD stage IV 3. COVID-19 PNA in 12/20 4. Restrictive lung disease: PFTs (2/21) with severe restriction, severely decreased DLCO.  - High resolution CT chest (2/21): no interstitial lung disease, "small vessel disease."  5. Gout 6. HTN 7. DVT: Remote, after train ride.  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  8. Pulmonary hypertension: RHC (2/21) with mean RA 14, PA 89/33 mean 54, mean PCWP 34, CI 2.22, PVR 3.87 WU  => mixed pulmonary arterial/pulmonary venous hypertension.  - V/Q scan (2/21): No evidence for chronic PE.  - Echo (2/21): EF 55-60%, moderately dilated RV with moderately decreased systolic function.  - HIV and rheumatologic serologies negative.  - RHC (9/21): mean RA 9, PA 68/23 mean 40, mean PCWP 11, PVR 5.6 WU, no step up in oxygen saturation so no evidence for left to right shunting.  - Echo (5/22): EF 55% with mildly enlarged RV with mild RV dysfunction, mildly D-shaped septum, PASP 48 with normal IVC. 9. Depression  Current Outpatient Medications  Medication Sig Dispense Refill   acetaminophen (TYLENOL) 500 MG tablet Take 1,000 mg by mouth every 6 (six) hours as needed for moderate pain or headache.     allopurinol (ZYLOPRIM) 100 MG tablet Take 100 mg by mouth daily.     apixaban (ELIQUIS) 5 MG TABS tablet Take 1 tablet (5 mg total) by mouth 2 (two) times daily. 60 tablet 3   Calcium Carbonate Antacid (TUMS E-X PO) Take 1 tablet by mouth as needed.     Cholecalciferol (VITAMIN D3) 125 MCG (5000 UT) CAPS Take 1 capsule by mouth daily.     colchicine 0.6 MG tablet Take 0.6 mg by mouth daily as needed (gout flare).     dapagliflozin propanediol (FARXIGA) 10 MG TABS tablet Take 1 tablet (10 mg total) by mouth daily before breakfast. 30 tablet 11   diclofenac Sodium (VOLTAREN) 1 % GEL Apply 2 g topically as needed.     diphenhydramine-acetaminophen (TYLENOL PM) 25-500 MG TABS tablet Take 2 tablets by mouth at bedtime as needed (sleep).     ipratropium-albuterol (DUONEB) 0.5-2.5 (3) MG/3ML SOLN Take 3 mLs by nebulization.     macitentan (OPSUMIT) 10 MG tablet Take 1 tablet (10 mg total) by mouth daily. 30 tablet 11   OXYGEN Inhale 4-8 L into the lungs continuous.     pantoprazole (PROTONIX) 40 MG tablet Take 40 mg by mouth daily.     potassium chloride SA (KLOR-CON) 20 MEQ tablet Take 20 mEq by mouth daily.      Selexipag (UPTRAVI) 800 MCG TABS Take 1 tablet (800 mcg total) by mouth 2 (two)  times daily. 60 tablet 11   sertraline (ZOLOFT) 25 MG tablet Take 1 tablet (25 mg total) by mouth daily. 30 tablet 2   Simethicone (GAS-X PO) Take 1 tablet by mouth as needed.     spironolactone (ALDACTONE) 25 MG tablet TAKE 1/2 TABLET(12.5 MG) BY MOUTH DAILY 45 tablet 3   tadalafil, PAH, (ADCIRCA) 20 MG tablet Take 2 tablets (40 mg total) by mouth daily. 60 tablet 5   torsemide (DEMADEX) 20 MG tablet Take 1 tablet (20 mg total) by mouth every morning AND 2 tablets (40 mg total) every evening. 90 tablet 11   No current facility-administered medications for this encounter.   Allergies  Allergen Reactions   Adhesive [Tape] Hives    Tolerates paper tape   Succinylcholine Other (See  Comments)    Have trouble waking up   Tizanidine Hives   Social History   Socioeconomic History   Marital status: Single    Spouse name: Not on file   Number of children: Not on file   Years of education: Not on file   Highest education level: Not on file  Occupational History   Not on file  Tobacco Use   Smoking status: Former   Smokeless tobacco: Never   Tobacco comments:    only smoked for fun in highschool not even one a day  Vaping Use   Vaping Use: Never used  Substance and Sexual Activity   Alcohol use: Yes    Comment: occ   Drug use: Not Currently   Sexual activity: Yes    Partners: Male    Comment: 1st intercourse- 74, partners- 89, current partner- 10 yrs   Other Topics Concern   Not on file  Social History Narrative   Not on file   Social Determinants of Health   Financial Resource Strain: High Risk   Difficulty of Paying Living Expenses: Very hard  Food Insecurity: Food Insecurity Present   Worried About Charity fundraiser in the Last Year: Sometimes true   Ran Out of Food in the Last Year: Never true  Transportation Needs: No Transportation Needs   Lack of Transportation (Medical): No   Lack of Transportation (Non-Medical): No  Physical Activity: Not on file  Stress: Not  on file  Social Connections: Not on file  Intimate Partner Violence: Not on file   Family History  Problem Relation Age of Onset   COPD Father    Cancer Mother        gallbladder and bladder    Hypertension Brother    Breast cancer Other    Diabetes Son    Breast cancer Cousin    Breast cancer Cousin    Pulmonary Hypertension Neg Hx    BP (!) 150/90   Pulse 66   Wt 112.9 kg (248 lb 12.8 oz)   LMP  (LMP Unknown) Comment: pmb last 2-3 months  SpO2 95% Comment: 2l n/c  BMI 47.01 kg/m   Wt Readings from Last 3 Encounters:  06/27/21 112.9 kg (248 lb 12.8 oz)  06/13/21 116.8 kg (257 lb 6.4 oz)  04/06/21 112 kg (247 lb)   Exam:   BP (!) 150/90   Pulse 66   Wt 112.9 kg (248 lb 12.8 oz)   LMP  (LMP Unknown) Comment: pmb last 2-3 months  SpO2 95% Comment: 2l n/c  BMI 47.01 kg/m   General:  NAD. No resp difficulty, walked into clinic with walker on oxygen HEENT: Normal Neck: Supple. No JVD. Carotids 2+ bilat; no bruits. No lymphadenopathy or thryomegaly appreciated. Cor: PMI nondisplaced. Regular rate & rhythm. No rubs, gallops or murmurs. Lungs: Clear Abdomen: Obese, nontender, nondistended. No hepatosplenomegaly. No bruits or masses. Good bowel sounds. Extremities: No cyanosis, clubbing, rash, edema Neuro: Alert & oriented x 3, cranial nerves grossly intact. Moves all 4 extremities w/o difficulty. Affect pleasant.   ASSESSMENT & PLAN:  1. Chronic diastolic CHF with significant RV dysfunction: Echo in 2/21 with LVEF 55-60%, moderate RV dilation/moderate RV dysfunction.  [place] 2/18 showed elevated left and right heart filling pressures with severe pulmonary hypertension.  Suspect mixed pulmonary venous/pulmonary arterial hypertension in setting of significant LV diastolic dysfunction (PVR 3.87 WU).  Repeat RHC in 9/21 with moderate-severe PAH, normal filling pressures, and PVR 5.6 WU.  No evidence  for left=>right shunting.  Echo in 5/22 showed EF 55% with mildly enlarged RV with  mild RV dysfunction, mildly D-shaped septum, PASP 48 with normal IVC.  NYHA class II-early III symptoms today. She is not volume overloaded on exam, weight is down 9 lbs.  - Continue torsemide at  20 qam/40 qpm (takes higher dose in afternoon due to work). BMET today. - Continue Farxiga 10 mg daily.  - Continue spiro 12.5 mg daily. Consider increasing pending kidney function. 2. Pulmonary hypertension: Suspect that this is a mixed picture with group 2 and group 3 PH (related to diastolic LV dysfunction and OHS/OSA).  However, concerned for component of group 1 PAH as well.  V/Q scan not suggestive of chronic PEs and high resolution CT chest in 2/21 was not suggestive of ILD. HIV and rheumatologic serologies negative.  RHC showed severe mixed pulmonary venous/pulmonary arterial hypertension, repeat RHC after diuresis in 9/21 showed PAH. PFTs completed in 2/21 showed severe restriction, severely decreased DLCO (?restriction due to body habitus).  Echo in 5/22 showed mild improvement in RV and improvement in estimated PA systolic pressure.  She is on Opsumit, tadalafil and Uptravi 800 mcg bid.   - She declines 6 minute walk today, she will need one at her next appointment. - Continue to maintain oxygen saturation with home O2 and CPAP. Weight loss also would be helpful.  - Continue Opsumit 10 mg daily. - Continue tadalafil 40 mg daily.  - She is tolerating Uptravi 800 mcg bid now, she will continue to increase slowly over time once volume status is better-controlled.      3. HTN: Elevated today. I have asked her to check her BP at home and log.   4. CKD stage 3 - BMET today.  - Followup with nephrology.  5. OHS/OSA: compliant w/ CPAP and home oxygen.  6. 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  As she is at baseline sedentary and has had recurrent DVTs, would continue Eliquis long-term.   7. Dysuria: Will check u/a and give her PRN diflucan. She is requesting a urine pregnancy test, will order  this. We discussed OTC emergency contraception. She will need to follow up with her GYN.  Followup in 2-3 months with Dr. Aundra Dubin.     Signed, Rafael Bihari, FNP  06/27/2021  [facility] [address] Heart and [place] [address] [phone] (office) [phone] (fax)

## 2021-06-27 ENCOUNTER — Other Ambulatory Visit: Payer: Self-pay

## 2021-06-27 ENCOUNTER — Encounter (HOSPITAL_COMMUNITY): Payer: Self-pay

## 2021-06-27 ENCOUNTER — Ambulatory Visit (HOSPITAL_COMMUNITY)
Admission: RE | Admit: 2021-06-27 | Discharge: 2021-06-27 | Disposition: A | Discharge status: Home / Self Care | Payer: Medicare Other | Source: Ambulatory Visit | Attending: Family Medicine | Admitting: Family Medicine

## 2021-06-27 ENCOUNTER — Other Ambulatory Visit (HOSPITAL_COMMUNITY): Payer: Self-pay | Admitting: Family Medicine

## 2021-06-27 VITALS — BP 150/90 | HR 66 | Wt 248.8 lb

## 2021-06-27 DIAGNOSIS — I5032 Chronic diastolic (congestive) heart failure: Secondary | ICD-10-CM

## 2021-06-27 DIAGNOSIS — I10 Essential (primary) hypertension: Secondary | ICD-10-CM | POA: Diagnosis not present

## 2021-06-27 DIAGNOSIS — Z5941 Food insecurity: Secondary | ICD-10-CM | POA: Insufficient documentation

## 2021-06-27 DIAGNOSIS — Z8616 Personal history of COVID-19: Secondary | ICD-10-CM | POA: Diagnosis not present

## 2021-06-27 DIAGNOSIS — Z7984 Long term (current) use of oral hypoglycemic drugs: Secondary | ICD-10-CM | POA: Diagnosis not present

## 2021-06-27 DIAGNOSIS — I272 Pulmonary hypertension, unspecified: Secondary | ICD-10-CM

## 2021-06-27 DIAGNOSIS — I82501 Chronic embolism and thrombosis of unspecified deep veins of right lower extremity: Secondary | ICD-10-CM | POA: Insufficient documentation

## 2021-06-27 DIAGNOSIS — Z596 Low income: Secondary | ICD-10-CM | POA: Diagnosis not present

## 2021-06-27 DIAGNOSIS — Z79899 Other long term (current) drug therapy: Secondary | ICD-10-CM | POA: Insufficient documentation

## 2021-06-27 DIAGNOSIS — Z7901 Long term (current) use of anticoagulants: Secondary | ICD-10-CM | POA: Diagnosis not present

## 2021-06-27 DIAGNOSIS — Z888 Allergy status to other drugs, medicaments and biological substances status: Secondary | ICD-10-CM | POA: Diagnosis not present

## 2021-06-27 DIAGNOSIS — N183 Chronic kidney disease, stage 3 unspecified: Secondary | ICD-10-CM | POA: Diagnosis not present

## 2021-06-27 DIAGNOSIS — G4733 Obstructive sleep apnea (adult) (pediatric): Secondary | ICD-10-CM | POA: Diagnosis not present

## 2021-06-27 DIAGNOSIS — I130 Hypertensive heart and chronic kidney disease with heart failure and stage 1 through stage 4 chronic kidney disease, or unspecified chronic kidney disease: Secondary | ICD-10-CM | POA: Insufficient documentation

## 2021-06-27 DIAGNOSIS — Z8249 Family history of ischemic heart disease and other diseases of the circulatory system: Secondary | ICD-10-CM | POA: Diagnosis not present

## 2021-06-27 DIAGNOSIS — R300 Dysuria: Secondary | ICD-10-CM | POA: Diagnosis not present

## 2021-06-27 DIAGNOSIS — J9611 Chronic respiratory failure with hypoxia: Secondary | ICD-10-CM

## 2021-06-27 DIAGNOSIS — I2721 Secondary pulmonary arterial hypertension: Secondary | ICD-10-CM | POA: Insufficient documentation

## 2021-06-27 DIAGNOSIS — Z86718 Personal history of other venous thrombosis and embolism: Secondary | ICD-10-CM

## 2021-06-27 LAB — URINALYSIS, ROUTINE W REFLEX MICROSCOPIC
Bilirubin Urine: NEGATIVE
Glucose, UA: 150 mg/dL — AB
Hgb urine dipstick: NEGATIVE
Ketones, ur: NEGATIVE mg/dL
Nitrite: NEGATIVE
Protein, ur: 100 mg/dL — AB
Specific Gravity, Urine: 1.012 (ref 1.005–1.030)
pH: 5 (ref 5.0–8.0)

## 2021-06-27 LAB — BASIC METABOLIC PANEL
Anion gap: 9 (ref 5–15)
BUN: 36 mg/dL — ABNORMAL HIGH (ref 6–20)
CO2: 25 mmol/L (ref 22–32)
Calcium: 9.7 mg/dL (ref 8.9–10.3)
Chloride: 105 mmol/L (ref 98–111)
Creatinine, Ser: 2.16 mg/dL — ABNORMAL HIGH (ref 0.44–1.00)
GFR, Estimated: 27 mL/min — ABNORMAL LOW (ref >60)
Glucose, Bld: 90 mg/dL (ref 70–99)
Potassium: 3.9 mmol/L (ref 3.5–5.1)
Sodium: 139 mmol/L (ref 135–145)

## 2021-06-27 MED ORDER — FLUCONAZOLE 150 MG PO TABS: 150.0000 mg | ORAL_TABLET | ORAL | 0 refills | Status: DC | PRN

## 2021-06-27 MED ORDER — ULIPRISTAL ACETATE 30 MG PO TABS: 1.0000 | ORAL_TABLET | ORAL | 0 refills | Status: DC | PRN

## 2021-06-27 NOTE — Patient Instructions (Addendum)
Take Diflucan 150 mg x 1 as needed for yeast infection  Labs done today, your results will be available in MyChart, we will contact you for abnormal readings.  Your physician recommends that you schedule a follow-up appointment in: 2-3 months with Dr. Aundra Dubin  If you have any questions or concerns before your next appointment please send [place] a message through [facility] or call our office at [phone].    TO LEAVE A MESSAGE FOR THE NURSE SELECT OPTION 2, PLEASE LEAVE A MESSAGE INCLUDING: YOUR NAME DATE OF BIRTH CALL BACK NUMBER REASON FOR CALL**this is important as we prioritize the call backs  YOU WILL RECEIVE A CALL BACK THE SAME DAY AS LONG AS YOU CALL BEFORE 4:00 PM  At the [facility], you and your health needs are our priority. As part of our continuing mission to provide you with exceptional heart care, we have created designated Provider Care Teams. These Care Teams include your primary Cardiologist (physician) and Advanced Practice Providers (APPs- Physician Assistants and Nurse Practitioners) who all work together to provide you with the care you need, when you need it.   You may see any of the following providers on your designated Care Team at your next follow up: Dr Glori Bickers Dr [name] Dr Patrice Paradise, NP Lyda Jester, [place] Ginnie Smart Audry Riles, PharmD   Please be sure to bring in all your medications bottles to every appointment.

## 2021-06-27 NOTE — Progress Notes (Signed)
Medication Samples have been provided to the patient.  Drug name: Eliquis        Strength: 5 mg        Qty: 4  LOT: [id number]  Exp.Date: 04/2023  Dosing instructions: Take 1 tablet Twice daily   The patient has been instructed regarding the correct time, dose, and frequency of taking this medication, including desired effects and most common side effects.   Dawn Fowler Charie Pinkus 2:49 PM 06/27/2021

## 2021-07-21 ENCOUNTER — Telehealth: Payer: Self-pay | Admitting: Pulmonary Disease

## 2021-07-21 NOTE — Telephone Encounter (Signed)
[facility] Telephone Encounter  Reports shortness of breath has improved after diuresis. She has lost 10 lbs in fluid. Still is short of breath but admits that she is working a sedentary job and has not been active in many weeks. She is also not currently taking her Breo.   Assessment/Plan  Deconditioning Asthma, not in exacerbation --Restart Breo --Continue nebulizers q6h PRN --Increase activity. Declined pulm rehab but may be interested in home rehab option --Keep follow up with me in Oct

## 2021-07-21 NOTE — Telephone Encounter (Signed)
Pt states she is very SOB on exertion, also vomited yesterday. Did covid test, negative. Pt states she had lost weight, her cardiologist increased fluid pill, now on normal dose. Feels fluid has decreased, but is very SOB on exertion. Please advise [phone]

## 2021-07-21 NOTE — Telephone Encounter (Signed)
Per secure chat with Dr Loanne Drilling- need to inquire if she is coughing or wheezing at all. Called pt to get more info and had to [facility]. Will forward back to triage to try her again tomorrow.

## 2021-07-21 NOTE — Telephone Encounter (Signed)
Primary Pulmonologist: Dr. Loanne Drilling Last office visit and with whom: Dr. Loanne Drilling o 09/20/2020 What do we see them for (pulmonary problems): OSA on CPAP, Pulm HTN, Chronic respiratory failure with Hypoxia Last OV assessment/plan: see below  Was appointment offered to patient (explain)?    Assessment & Plan:    Discussion: 55 year old female never smoker who presents for follow-up for severe pulmonary hypertension and severe OSA.  Since her last visit she has had a sleep study which recommended nocturnal oxygen normal AHI.  She is adamant that this does not represent her usual sleep and the CPAP does provide benefit so we will continue this therapy.  Cardiology is managing her pulmonary hypertension medications and was recently started on Uptravi which she is tolerating fairly.  She is not a lung transplant candidate due to her BMI.  We will focus our efforts on weight loss and possible surgery.  Due to her pulmonary hypertension she would be high risk in the OR however if she is able to lose a significant amount of weight, I believe her respiratory status would benefit.   Severe Pulmonary Hypertension Group II, III Unable to rule out group I (neg autoimmune serologies) Not a transplant candidate due to BMI Chronic hypoxemic respiratory failure NYHA class III symptoms, unchanged --Continue supplemental oxygen as prescribed --Continue medications per Cardiology: Opsumit, tadalfil, uptravi (ERA, PD5, prostacyclin) --Re-refer to [facility]    Severe OSA on CPAP --Continue CPAP on current settings  --Advised against driving when sleepy   Moderately severe asthma Restrictive defect likely related to chronic diastolic heart failure --Continue Breo --Continue nebs PRN   Chronic diastolic heart failure, RV dysfunction --Continue diuretics per Cardiology   Recurrent DVT Hx of RLE DVT in 05/2020 and 2017. Diagnosed peri-travel however given recurrence, may need to consider  lifelong anticoagulation. Cardiology agrees with long-term anticoagulation. --Continue Eliquis  Follow-up in 3 months    Reason for call: I called and spoke with patient regarding message. Patient stated breathing became worse about 2 weeks ago. Cardiologist upped Torsemide to 20mg  in AM and 40mg  at night. This has helped but patient is very frustrated as she cannot do daily activities anymore like laundry, cleaning and walking her dog due to becoming SOB and having to rest. She is pretty much Home-bound and is very depressed at this point. She was recently diagnosed with Scoliosis and referred to plastic surgery for options. She does have an OV on 08/02/21 but is just looking for anymore recs from Dr. Loanne Drilling. She is using all meds as prescribed and using oxygen 4-8L. Will route to Dr. Loanne Drilling.  Dr. Loanne Drilling, please advise. Thanks!  (examples of things to ask: : When did symptoms start? Fever? Cough? Productive? Color to sputum? More sputum than usual? Wheezing? Have you needed increased oxygen? Are you taking your respiratory medications? What over the counter measures have you tried?)  Allergies  Allergen Reactions   Adhesive [Tape] Hives    Tolerates paper tape   Succinylcholine Other (See Comments)    Have trouble waking up   Tizanidine Hives    Immunization History  Administered Date(s) Administered   Fluad Quad(high Dose 65+) 09/20/2020   Influenza Whole 08/20/2019   Influenza,inj,Quad PF,6+ Mos 06/25/2018   PFIZER(Purple Top)SARS-COV-2 Vaccination 02/02/2020, 02/24/2020   Pneumococcal Polysaccharide-23 07/17/2014

## 2021-07-26 ENCOUNTER — Telehealth (HOSPITAL_COMMUNITY): Payer: Self-pay | Admitting: Cardiology

## 2021-07-26 NOTE — Telephone Encounter (Signed)
Patient left message requesting samples of eleiquis until she can pay for co pay. Samples left for patient and pt aware.  Medication Samples have been provided to the patient.  Drug name: eliquis       Strength: 5 mg        Qty: 14  LOT: [id number]  Exp.Date: 05/2023  Dosing instructions: one tab twice a day  The patient has been instructed regarding the correct time, dose, and frequency of taking this medication, including desired effects and most common side effects.   Kerry Dory 9:52 AM 07/26/2021

## 2021-07-29 ENCOUNTER — Ambulatory Visit: Payer: Medicare Other | Admitting: Obstetrics & Gynecology

## 2021-08-02 ENCOUNTER — Other Ambulatory Visit: Payer: Self-pay

## 2021-08-02 ENCOUNTER — Ambulatory Visit (INDEPENDENT_AMBULATORY_CARE_PROVIDER_SITE_OTHER): Payer: Medicare Other | Admitting: Pulmonary Disease

## 2021-08-02 ENCOUNTER — Encounter (HOSPITAL_COMMUNITY): Payer: Self-pay

## 2021-08-02 ENCOUNTER — Encounter: Payer: Self-pay | Admitting: Pulmonary Disease

## 2021-08-02 VITALS — BP 114/64 | HR 99 | Temp 97.8°F | Ht 61.0 in | Wt 254.2 lb

## 2021-08-02 DIAGNOSIS — B379 Candidiasis, unspecified: Secondary | ICD-10-CM | POA: Diagnosis not present

## 2021-08-02 DIAGNOSIS — G4733 Obstructive sleep apnea (adult) (pediatric): Secondary | ICD-10-CM

## 2021-08-02 DIAGNOSIS — J9611 Chronic respiratory failure with hypoxia: Secondary | ICD-10-CM

## 2021-08-02 DIAGNOSIS — I272 Pulmonary hypertension, unspecified: Secondary | ICD-10-CM | POA: Diagnosis not present

## 2021-08-02 DIAGNOSIS — Z9989 Dependence on other enabling machines and devices: Secondary | ICD-10-CM

## 2021-08-02 MED ORDER — FLUCONAZOLE 150 MG PO TABS: 150.0000 mg | ORAL_TABLET | ORAL | 0 refills | Status: DC | PRN

## 2021-08-02 NOTE — Progress Notes (Addendum)
Subjective:   PATIENT ID: Dawn Fowler GENDER: female DOB: 02/21/1966, MRN: [id number]   Chief Complaint  Patient presents with   Follow-up    Pt states she has had some fluid build-up since last office visit. States since last call with JE, pt is doing okay but states that her weight has increased and also states that she has had some increased SOB.   Reason for Visit: Follow-up visit  Ms. 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  Synopsis: Previously followed for chronic hypoxemic failure 2/2 PH and severe OSA at the [facility] in [place], [place]. Work-up included RHC 12/2016 RA 19 PA 76/40/48 PCWP 20 CO 4.02L/min CI 1.9, V/Q scan (neg), PFTs 06/2018 (mod restrictive defect), CT Chest (no evidence of ILD). Started on Opsumit and tadalafil. Moved to [place] in 2020. She establishing care in Pulmonary clinic, she was found to be in volume overload. She eventually was admitted for acute diastolic heart failure for IV diuretics. She underwent repeat work-up for her PH including RHC 12/2019 which demonstrated elevated right and left heart filling pressure with severe PH, V/Q scan (neg), PFT 12/2019 (severe restriction and reduced DLCO), HRCT (no evidence of ILD) and negative serologies.  08/20/20 Since our last visit, she had a sleep study completed however did not sleep without interruption and felt the experience was overall poor.  She did not feel the sleep study was representative of what she did at home.  Otherwise she reports her dyspnea is at baseline and that she has been compliant with therapies including her home oxygen 4 L at rest and 8 L on exertion.  She continues to wear her CPAP which she finds comfortable and improves her dyspnea and quality of sleep.  When she does not wear her CPAP she  notices a difference in her breathing. She has been participating PT and noticed her SpO2 to low 80s on 6L via [place]. She recovers within 2 minutes with rest with sats to upper 90s. Cardiology has added uptravi to her current regiment of Opsumit and tadalfil. She has diarrhea that is controlled lomotil. She has been referred to weight loss clinic but not heard from it. She is considering a breast reduction.  Denies dizziness, chest pain, cough, wheezing or leg swelling.  No recent infections.  She also has questions about Covid and shingles vaccines.  08/02/21 Since our last visit, she has had back issues. She has been referred to Plastics to evaluate for breast reduction surgery as she feels this significantly affects her breathing. She has restarted Breo and is compliant with her oxygen 8L on exertion. Compliant with CPAP which provide clinically benefit with use including improved quality of sleep and energy. She is able to ambulate within the house but has difficulty with upper body activities that cause her to be winded. 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 She is compliant with her Uptravi and Opsumit. Cardiology started her on Farxiga and she reports now having a yeast infection that began 2-3 days ago with itching and odor.   Social history: Social stressors with her son and daughter-in-law has been contributing to her depression Friend planning to move in for support  Past Medical History:  Diagnosis Date  Acute respiratory failure due to COVID-19 [facility]) 10/30/2019   Arthritis    CHF (congestive heart failure) (HCC)    CKD (chronic kidney disease), stage III (HCC)    Complication of anesthesia    hard to wake up after general anesthesia - had trouble waking up after succinylcholine (consider possibility of pseudocholinesterase deficiency)   COVID-19 virus detected 11/20/2019   Tested  positive for Covid in December/2020 Required hospitalization in December/2020 through January/2021 Received Redemsivir   Dyspnea    Family history of adverse reaction to anesthesia    hard to wake up after general anesthesia   GERD (gastroesophageal reflux disease)    HLD (hyperlipidemia)    HTN (hypertension)    Pre-diabetes    no meds   Pulmonary HTN (HCC)    Sleep apnea    on CPAP    Outpatient Medications Prior to Visit  Medication Sig Dispense Refill   acetaminophen (TYLENOL) 500 MG tablet Take 1,000 mg by mouth every 6 (six) hours as needed for moderate pain or headache.     allopurinol (ZYLOPRIM) 100 MG tablet Take 100 mg by mouth daily.     apixaban (ELIQUIS) 5 MG TABS tablet Take 1 tablet (5 mg total) by mouth 2 (two) times daily. 60 tablet 3   Calcium Carbonate Antacid (TUMS E-X PO) Take 1 tablet by mouth as needed.     Cholecalciferol (VITAMIN D3) 125 MCG (5000 UT) CAPS Take 1 capsule by mouth daily.     colchicine 0.6 MG tablet Take 0.6 mg by mouth daily as needed (gout flare).     dapagliflozin propanediol (FARXIGA) 10 MG TABS tablet Take 1 tablet (10 mg total) by mouth daily before breakfast. 30 tablet 11   diclofenac Sodium (VOLTAREN) 1 % GEL Apply 2 g topically as needed.     diphenhydramine-acetaminophen (TYLENOL PM) 25-500 MG TABS tablet Take 2 tablets by mouth at bedtime as needed (sleep).     fluconazole (DIFLUCAN) 150 MG tablet Take 1 tablet (150 mg total) by mouth as needed. Take as needed for yeast infection symptoms 1 tablet 0   ipratropium-albuterol (DUONEB) 0.5-2.5 (3) MG/3ML SOLN Take 3 mLs by nebulization.     macitentan (OPSUMIT) 10 MG tablet Take 1 tablet (10 mg total) by mouth daily. 30 tablet 11   OXYGEN Inhale 4-8 L into the lungs continuous.     pantoprazole (PROTONIX) 40 MG tablet Take 40 mg by mouth daily.     potassium chloride SA (KLOR-CON) 20 MEQ tablet Take 20 mEq by mouth daily.      Selexipag (UPTRAVI) 800 MCG TABS Take 1 tablet (800 mcg total)  by mouth 2 (two) times daily. 60 tablet 11   sertraline (ZOLOFT) 25 MG tablet Take 1 tablet (25 mg total) by mouth daily. 30 tablet 2   Simethicone (GAS-X PO) Take 1 tablet by mouth as needed.     spironolactone (ALDACTONE) 25 MG tablet TAKE 1/2 TABLET(12.5 MG) BY MOUTH DAILY 45 tablet 3   tadalafil, PAH, (ADCIRCA) 20 MG tablet Take 2 tablets (40 mg total) by mouth daily. 60 tablet 5   torsemide (DEMADEX) 20 MG tablet Take 1 tablet (20 mg total) by mouth every morning AND 2 tablets (40 mg total) every evening. 90 tablet 11   No facility-administered medications prior to visit.    Review of Systems  Constitutional:  Negative for chills, diaphoresis, fever, malaise/fatigue and weight loss.  HENT:  Negative for congestion.   Respiratory:  Positive for shortness of breath.  Negative for cough, hemoptysis, sputum production and wheezing.   Cardiovascular:  Negative for chest pain, palpitations and leg swelling.  Musculoskeletal:  Positive for back pain.    Objective:   Vitals:   08/02/21 0901  BP: 114/64  Pulse: 99  Temp: 97.8 F (36.6 C)  TempSrc: Oral  SpO2: 96%  Weight: 254 lb 3.2 oz (115.3 kg)  Height: 5\' 1"  (1.549 m)  Body mass index is 48.03 kg/m.     Physical Exam: General: Well-appearing, no acute distress, euvolemic HENT: Learned, AT Eyes: EOMI, no scleral icterus Respiratory: Clear to auscultation bilaterally.  No crackles, wheezing or rales Cardiovascular: RRR, -M/R/G, no JVD Extremities:-Edema,-tenderness Neuro: AAO x4, CNII-XII grossly intact Psych: Depressed mood, normal affect  Data Reviewed:  Imaging:  12/25/2014-VQ scan-mild obstructive airways disease, very low probability of PE  12/18/2014-CTA anterior-no evidence of PE, cardiomegaly, dependent airspace trapping which may be related to morbid obesity or small airways disease ^Unable to view images but can read radiology report  07/26/18 CT Chest WO-(report only) enlarged MPA and centroal pulmonary arteries  consistent with hx of pulmonary hypertension.  Stable predominantly basilar mosaic attenuation reflects either small vessel or small airways disease.  Persistent predominant subcarinal, paraesophageal and bilateral infrahilar lymph node enlargement unchanged from 4 months ago.  Stable small perifissural nodule likely representing intranodular lymph node.  Other concerning parenchymal lesions not seen. -Report does not mention any interstitial lung disease.  05/11/2020- CXR cardiomegaly. No pulmonary edema or infiltrate  12/21/19 -VQ scan- no evidence of chronic PE  12/21/19 CT Chest - No parenchymal abnormalities. Mosaic attenuation. Enlarged PAH  PFT: 02/11/2015-office spirometry-FVC 2.14 L, FEV1 1.78, ratio 83  12/22/19  FVC 1.43 (48%) FEV1 1.27 (54%) Ratio 87  TLC 64% DLCO 32% Interpretation: No evidence of obstructive defect however significant response to bronchodilators suggestive of asthma. Co-comitant severe restrictive lung defect with reduced gas exchange also present.  Echo 12/21/2014 LV ejection fraction 60 to 65%, moderate pulmonary hypertension, moderate diastolic dysfunction  11/18/39  LV EF 55-60%, moderately dilated RV with decreased systolic function    7/40/81 RHC Procedural Findings: Hemodynamics (mmHg) RA mean 14 RV 87/21 PA 89/33, mean 54 PCWP mean 34 Oxygen saturations: SVC 63% PA 61% AO 94% Cardiac Output (Fick) 5.17  Cardiac Index (Fick) 2.22 PVR 3.87 WU  CPAP Compliance 07/02/21-07/31/21 Usage days 100% >4 hours 100% AHI 0.9 on CPAP 9 cm H20  Assessment & Plan:   Discussion: 55 year old female never smoker who presents for follow-up for severe OSA and asthma management. Following Cardiology for pulmonary hypertension. Recent weight gain and worsening shortness of breath. Patient has not taken additional diuretic.   For long term management of her respiratory symptoms, reasonable to pursue weight loss/breast reduction surgery given her co-comitant  restrictive defect related to chest wall compliance. She has been referred to Plastics and awaiting evaluation. Counseled to continue bronchodilators and diuretics per Cardiology.   Severe Pulmonary Hypertension Group II, III -Unable to rule out group I (neg autoimmune serologies). Not a transplant candidate due to BMI Chronic hypoxemic respiratory failure NYHA class III symptoms, unchanged --CONTINUE supplemental oxygen for goal >88% --Continue medications per Cardiology: Opsumit, tadalfil, uptravi (ERA, PD5, prostacyclin) --Take additional diuretics as directed by Cardiology  Severe OSA on CPAP - Compliant  Patient uses NIV for more than four hours nightly for more then 4 hours per night on 70% of nights during the last three months of usage. --Continue CPAP nightly --Counseled NOT to drive if/when sleepy  Moderately severe asthma Restrictive defect likely related to chronic diastolic heart failure --CONTINUE Breo ONE puff daily --Continue nebs PRN  Chronic diastolic heart failure, RV dysfunction --Continue diuretics per Cardiology  Recurrent DVT Hx of RLE DVT in 05/2020 and 2017. Diagnosed peri-travel however given recurrence, may need to consider lifelong anticoagulation. Cardiology agrees with long-term anticoagulation. --Continue Eliquis  Yeast infection --Diflucan once --Do not take colchicine while taking this  Health Maintenance Immunization History  Administered Date(s) Administered   Fluad Quad(high Dose 65+) 09/20/2020   Influenza Whole 08/20/2019   Influenza,inj,Quad PF,6+ Mos 06/25/2018   PFIZER(Purple Top)SARS-COV-2 Vaccination 02/02/2020, 02/24/2020   Pneumococcal Polysaccharide-23 07/17/2014   CT Lung Screen -never smoker, would not qualify  I have spent a total time of 35-minutes on the day of the appointment reviewing prior documentation, coordinating care and discussing medical diagnosis and plan with the patient/family. Past medical history, allergies,  medications were reviewed. Pertinent imaging, labs and tests included in this note have been reviewed and interpreted independently by me.  [place], MD [facility] 08/02/2021 8:36 AM  Office Number [phone]

## 2021-08-02 NOTE — Patient Instructions (Addendum)
Severe Pulmonary Hypertension Group II, III Unable to rule out group I (neg autoimmune serologies) Not a transplant candidate due to BMI Chronic hypoxemic respiratory failure NYHA class III symptoms, unchanged --CONTINUE supplemental oxygen for goal >88% --Continue medications per Cardiology: Opsumit, tadalfil, uptravi (ERA, PD5, prostacyclin) --Take additional diuretics for weight gain  Severe OSA on CPAP --Continue CPAP on current settings  --Advised against driving when sleepy  Moderately severe asthma Restrictive defect likely related to chronic diastolic heart failure --CONTINUE Breo --Continue nebs PRN  Yeast infection --Diflucan once --Do not take colchicine while taking this  Follow-up with me in 6 months

## 2021-08-05 ENCOUNTER — Institutional Professional Consult (permissible substitution): Payer: Medicare Other | Admitting: Plastic Surgery

## 2021-08-12 ENCOUNTER — Telehealth (HOSPITAL_COMMUNITY): Payer: Self-pay | Admitting: Licensed Clinical Social Worker

## 2021-08-12 NOTE — Telephone Encounter (Signed)
CSW consulted to reach out to pt about finding a new PCP.  CSW spoke with pt and send list of [facility] PCPs so she can find one located conveniently to her and set up an intial appt.  Pt reports no further needs at this time  Jorge Ny, [place] Worker [facility] Desk#: [phone] Cell#: [phone]

## 2021-08-18 ENCOUNTER — Other Ambulatory Visit (HOSPITAL_COMMUNITY): Payer: Self-pay

## 2021-08-18 MED ORDER — SERTRALINE HCL 25 MG PO TABS: 25.0000 mg | ORAL_TABLET | Freq: Every day | ORAL | 2 refills | Status: DC

## 2021-09-02 ENCOUNTER — Ambulatory Visit: Payer: Medicare Other | Admitting: Plastic Surgery

## 2021-09-02 ENCOUNTER — Encounter: Payer: Self-pay | Admitting: Plastic Surgery

## 2021-09-02 ENCOUNTER — Other Ambulatory Visit: Payer: Self-pay

## 2021-09-02 VITALS — BP 129/86 | Ht 61.0 in | Wt 258.6 lb

## 2021-09-02 DIAGNOSIS — M546 Pain in thoracic spine: Secondary | ICD-10-CM | POA: Diagnosis not present

## 2021-09-02 DIAGNOSIS — G8929 Other chronic pain: Secondary | ICD-10-CM

## 2021-09-02 DIAGNOSIS — N62 Hypertrophy of breast: Secondary | ICD-10-CM

## 2021-09-02 NOTE — Progress Notes (Signed)
Referring Provider Melina Schools, MD [address] [place] [place],  [place] [address]   CC:  Breast hypertrophy and back pain  Dawn Fowler is an 55 y.o. female.  HPI: Mammary Hyperplasia: The patient is a 55 y.o. female with a history of mammary hyperplasia for several years.  She has extremely large breasts causing symptoms that include the following: Back pain in the upper and lower back, including neck pain. She pulls or pins her bra straps to provide better lift and relief of the pressure and pain. She notices relief by holding her breast up manually.  Her shoulder straps cause grooves and pain and pressure that requires padding for relief. Pain medication is sometimes required with motrin and tylenol.  Activities that are hindered by enlarged breasts include: exercise and running.  In addition she notes that she is on home oxygen and feels that her large breasts restricts her breathing.  She has a diagnosis of pulmonary hypertension and is on 4 to 8 L of home oxygen.  She has discussed breast reduction with her pulmonologist Dr. Loanne Drilling who she states is supportive of her having breast reduction surgery.  Dates that this is also been discussed with Dr. Aundra Dubin  who is her cardiologist.  Dr. Marigene Ehlers also manages her Eliquis.  She has tried supportive clothing as well as fitted bras without improvement.  She denies any tobacco use.  Her breasts are extremely large and fairly symmetric.  She has hyperpigmentation of the inframammary area on both sides.  Preoperative bra size = DDD cup.   Mammogram history: Normal mammogram June 2022.   Tobacco use:  none.   The patient expresses the desire to pursue surgical intervention.   Allergies  Allergen Reactions   Adhesive [Tape] Hives    Tolerates paper tape   Succinylcholine Other (See Comments)    Have trouble waking up   Tizanidine Hives    Outpatient Encounter Medications as of 09/02/2021  Medication Sig Note   acetaminophen  (TYLENOL) 500 MG tablet Take 1,000 mg by mouth every 6 (six) hours as needed for moderate pain or headache.    allopurinol (ZYLOPRIM) 100 MG tablet Take 100 mg by mouth daily.    apixaban (ELIQUIS) 5 MG TABS tablet Take 1 tablet (5 mg total) by mouth 2 (two) times daily.    Biotin w/ Vitamins C & E (HAIR SKIN & NAILS GUMMIES PO) Take 2 each by mouth daily.    Calcium Carbonate Antacid (TUMS E-X PO) Take 1 tablet by mouth as needed.    Cholecalciferol (VITAMIN D3) 125 MCG (5000 UT) CAPS Take 1 capsule by mouth daily.    colchicine 0.6 MG tablet Take 0.6 mg by mouth daily as needed (gout flare).    dapagliflozin propanediol (FARXIGA) 10 MG TABS tablet Take 1 tablet (10 mg total) by mouth daily before breakfast.    diclofenac Sodium (VOLTAREN) 1 % GEL Apply 2 g topically as needed.    diphenhydramine-acetaminophen (TYLENOL PM) 25-500 MG TABS tablet Take 2 tablets by mouth at bedtime as needed (sleep).    fluconazole (DIFLUCAN) 150 MG tablet Take 1 tablet (150 mg total) by mouth as needed. Take as needed for yeast infection symptoms    ipratropium-albuterol (DUONEB) 0.5-2.5 (3) MG/3ML SOLN Take 3 mLs by nebulization.    macitentan (OPSUMIT) 10 MG tablet Take 1 tablet (10 mg total) by mouth daily.    OXYGEN Inhale 4-8 L into the lungs continuous.    pantoprazole (PROTONIX) 40 MG tablet  Take 40 mg by mouth daily.    potassium chloride SA (KLOR-CON) 20 MEQ tablet Take 20 mEq by mouth daily.  11/11/2020: .   Selexipag (UPTRAVI) 800 MCG TABS Take 1 tablet (800 mcg total) by mouth 2 (two) times daily.    sertraline (ZOLOFT) 25 MG tablet Take 1 tablet (25 mg total) by mouth daily.    Simethicone (GAS-X PO) Take 1 tablet by mouth as needed.    spironolactone (ALDACTONE) 25 MG tablet TAKE 1/2 TABLET(12.5 MG) BY MOUTH DAILY    tadalafil, PAH, (ADCIRCA) 20 MG tablet Take 2 tablets (40 mg total) by mouth daily.    torsemide (DEMADEX) 20 MG tablet Take 1 tablet (20 mg total) by mouth every morning AND 2 tablets  (40 mg total) every evening.    No facility-administered encounter medications on file as of 09/02/2021.     Past Medical History:  Diagnosis Date   Acute respiratory failure due to COVID-19 [facility]) 10/30/2019   Arthritis    CHF (congestive heart failure) (HCC)    CKD (chronic kidney disease), stage III (HCC)    Complication of anesthesia    hard to wake up after general anesthesia - had trouble waking up after succinylcholine (consider possibility of pseudocholinesterase deficiency)   COVID-19 virus detected 11/20/2019   Tested positive for Covid in December/2020 Required hospitalization in December/2020 through January/2021 Received Redemsivir   Dyspnea    Family history of adverse reaction to anesthesia    hard to wake up after general anesthesia   GERD (gastroesophageal reflux disease)    HLD (hyperlipidemia)    HTN (hypertension)    Pre-diabetes    no meds   Pulmonary HTN ([place])    Sleep apnea    on CPAP    Past Surgical History:  Procedure Laterality Date   CARPAL TUNNEL RELEASE Bilateral    CESAREAN SECTION     COLONOSCOPY WITH PROPOFOL N/A 03/12/2020   Procedure: COLONOSCOPY WITH PROPOFOL;  Surgeon: Carol Ada, MD;  Location: [facility];  Service: Endoscopy;  Laterality: N/A;   DILATATION & CURETTAGE/HYSTEROSCOPY WITH MYOSURE N/A 04/08/2020   Procedure: [place];  Surgeon: Princess Bruins, MD;  Location: [facility];  Service: Gynecology;  Laterality: N/A;  request 8:30am OR start-requested through [facility] for [place] requests one hour   HEMOSTASIS CLIP PLACEMENT  03/12/2020   Procedure: HEMOSTASIS CLIP PLACEMENT;  Surgeon: Carol Ada, MD;  Location: [facility];  Service: Endoscopy;;   LEG SURGERY Right 1988   metal rod   lumbar surgery     POLYPECTOMY  03/12/2020   Procedure: POLYPECTOMY;  Surgeon: Carol Ada, MD;  Location: [facility];  Service: Endoscopy;;   RIGHT HEART CATH N/A 12/18/2019   Procedure: RIGHT HEART  CATH;  Surgeon: Larey Dresser, MD;  Location: [facility];  Service: Cardiovascular;  Laterality: N/A;   RIGHT HEART CATH N/A 07/12/2020   Procedure: RIGHT HEART CATH;  Surgeon: Larey Dresser, MD;  Location: [facility];  Service: Cardiovascular;  Laterality: N/A;    Family History  Problem Relation Age of Onset   COPD Father    Cancer Mother        gallbladder and bladder    Hypertension Brother    Breast cancer Other    Diabetes Son    Breast cancer Cousin    Breast cancer Cousin    Pulmonary Hypertension Neg Hx     Social history: No tobacco use, no illicit drug use, presents  with caretaker.  She works from home.   Review of Systems General: Denies fevers, chills, weight loss CV: Denies chest pain, shortness of breath, palpitations   Physical Exam Vitals with BMI 09/02/2021 08/02/2021 06/27/2021  Height 5\' 1"  5\' 1"  -  Weight 258 lbs 10 oz 254 lbs 3 oz 248 lbs 13 oz  BMI 95.28 41.32 -  Systolic 440 102 725  Diastolic 86 64 90  Pulse - 99 66    General:  No acute distress,  Alert and oriented, Non-Toxic, Normal speech and affect Breast: Patient has very large breasts.  She has severe ptosis.  No breast masses were easily palpable on exam.  The sternal to nipple distance on the right is 45 cm and the left is 45 cm.  The IMF distance is 19 cm bilaterally.  Assessment/Plan The patient has bilateral symptomatic macromastia.  She is a good candidate for a breast reduction but does have significant comorbidities.  There is a good chance she would require free nipple graft at the time of reduction.  She is interested in pursuing surgical treatment.  She has tried supportive garments and fitted bras with no relief.  The details of breast reduction surgery were discussed.  I explained the procedure in detail along the with the expected scars.  The risks were discussed in detail and include bleeding, infection, damage to surrounding structures, need for additional  procedures, nipple loss, change in nipple sensation, persistent pain, contour irregularities and asymmetries.  I explained that breast feeding is often not possible after breast reduction surgery.  We discussed the expected postoperative course with an overall recovery period of about 1 month.  She demonstrated full understanding of all risks.  We discussed her personal risk factors that include obesity and pulmonary hypertension, and need for anticoagulation.  The patient is interested in pursuing surgical treatment.  I anticipate approximately 1100 g of tissue removed from each side.   It appears that her pulmonologist is aware that she is considering the surgery and is supportive.  We additionally would like to make sure her cardiologist is supportive of her proceeding with this surgery.  She states he is the prescribing physician for her Eliquis so I would like his recommendations on handling this perioperatively.  We discussed that she is at high risk for complications or even death with the surgery compared to other patients based on her cardiac and pulmonary status but she is very interested in proceeding because she feels this will improve her breathing.   Pictures were obtained of the patient and placed in the chart with the patient's or guardian's permission.    Time based coding: 31 minutes were spent with the patient.  Greater than 50% was spent on counseling cordination of care.    Dawn Fowler 09/02/2021, 1:01 PM

## 2021-09-07 NOTE — Progress Notes (Signed)
I think that surgery would be ok as long as her symptoms are minimal.  She will need to continue her pulmonary hypertension meds.  RV looked better on last echo.  She could hold Eliquis 2 days prior.  She needs appointment with APP in our clinic to make sure volume status is stable prior to undergoing surgery.   Nira Conn, could you or someone else please arrange APP clinic visit for preop assessment?

## 2021-09-12 ENCOUNTER — Institutional Professional Consult (permissible substitution): Payer: Medicare Other | Admitting: Plastic Surgery

## 2021-09-12 ENCOUNTER — Encounter (HOSPITAL_COMMUNITY): Payer: Self-pay

## 2021-09-14 ENCOUNTER — Other Ambulatory Visit (HOSPITAL_COMMUNITY): Payer: Self-pay | Admitting: *Deleted

## 2021-09-14 MED ORDER — SERTRALINE HCL 25 MG PO TABS: 25.0000 mg | ORAL_TABLET | Freq: Every day | ORAL | 2 refills | Status: DC

## 2021-09-14 MED ORDER — TADALAFIL (PAH) 20 MG PO TABS: 40.0000 mg | ORAL_TABLET | Freq: Every day | ORAL | 5 refills | Status: DC

## 2021-09-14 MED ORDER — SPIRONOLACTONE 25 MG PO TABS: ORAL_TABLET | ORAL | 3 refills | Status: DC

## 2021-09-14 MED ORDER — POTASSIUM CHLORIDE CRYS ER 20 MEQ PO TBCR: 20.0000 meq | EXTENDED_RELEASE_TABLET | Freq: Every day | ORAL | 3 refills | Status: DC

## 2021-09-26 ENCOUNTER — Telehealth (HOSPITAL_COMMUNITY): Payer: Self-pay | Admitting: *Deleted

## 2021-09-26 ENCOUNTER — Telehealth (HOSPITAL_COMMUNITY): Payer: Self-pay | Admitting: Vascular Surgery

## 2021-09-26 NOTE — Telephone Encounter (Signed)
Pt left vm qith after hours answering service to r/s appt to a Friday. I called pt to r/s and no asnwer. I left vm requesting returm call.

## 2021-09-26 NOTE — Telephone Encounter (Signed)
Returned pt call to move 11/29 appt w/ [facility] yo a Friday, appt moved to 122 @ 12, asked pt to call back to confirm appt change

## 2021-09-27 ENCOUNTER — Encounter (HOSPITAL_COMMUNITY): Payer: Medicare Other | Admitting: Cardiology

## 2021-09-30 ENCOUNTER — Ambulatory Visit (HOSPITAL_COMMUNITY)
Admission: RE | Admit: 2021-09-30 | Discharge: 2021-09-30 | Disposition: A | Discharge status: Home / Self Care | Payer: Medicare Other | Source: Ambulatory Visit | Attending: Cardiology | Admitting: Cardiology

## 2021-09-30 VITALS — BP 122/80 | HR 81 | Wt 253.2 lb

## 2021-09-30 DIAGNOSIS — I5032 Chronic diastolic (congestive) heart failure: Secondary | ICD-10-CM | POA: Diagnosis present

## 2021-09-30 DIAGNOSIS — Z5941 Food insecurity: Secondary | ICD-10-CM | POA: Diagnosis not present

## 2021-09-30 DIAGNOSIS — Z87891 Personal history of nicotine dependence: Secondary | ICD-10-CM | POA: Diagnosis not present

## 2021-09-30 DIAGNOSIS — G4733 Obstructive sleep apnea (adult) (pediatric): Secondary | ICD-10-CM | POA: Insufficient documentation

## 2021-09-30 DIAGNOSIS — Z596 Low income: Secondary | ICD-10-CM | POA: Diagnosis not present

## 2021-09-30 DIAGNOSIS — Z8616 Personal history of COVID-19: Secondary | ICD-10-CM | POA: Diagnosis not present

## 2021-09-30 DIAGNOSIS — Z86718 Personal history of other venous thrombosis and embolism: Secondary | ICD-10-CM | POA: Diagnosis not present

## 2021-09-30 DIAGNOSIS — I130 Hypertensive heart and chronic kidney disease with heart failure and stage 1 through stage 4 chronic kidney disease, or unspecified chronic kidney disease: Secondary | ICD-10-CM | POA: Insufficient documentation

## 2021-09-30 LAB — BASIC METABOLIC PANEL
Anion gap: 11 (ref 5–15)
BUN: 43 mg/dL — ABNORMAL HIGH (ref 6–20)
CO2: 24 mmol/L (ref 22–32)
Calcium: 9.8 mg/dL (ref 8.9–10.3)
Chloride: 104 mmol/L (ref 98–111)
Creatinine, Ser: 2.32 mg/dL — ABNORMAL HIGH (ref 0.44–1.00)
GFR, Estimated: 24 mL/min — ABNORMAL LOW (ref >60)
Glucose, Bld: 104 mg/dL — ABNORMAL HIGH (ref 70–99)
Potassium: 4.1 mmol/L (ref 3.5–5.1)
Sodium: 139 mmol/L (ref 135–145)

## 2021-09-30 LAB — BRAIN NATRIURETIC PEPTIDE: B Natriuretic Peptide: 41.8 pg/mL (ref 0.0–100.0)

## 2021-09-30 MED ORDER — TORSEMIDE 20 MG PO TABS: 40.0000 mg | ORAL_TABLET | Freq: Two times a day (BID) | ORAL | 6 refills | Status: DC

## 2021-09-30 NOTE — Patient Instructions (Signed)
Medication Changes:  Increase Torsemide to 40mg  Twice daily   Lab Work:  Labs done today, your results will be available in MyChart, we will contact you for abnormal readings.  Repeat Labs in 10 days  Testing/Procedures:  None  Referrals:  None  Special Instructions // Education:    Follow-Up in: 6 weeks  At the [facility], you and your health needs are our priority. We have a designated team specialized in the treatment of Heart Failure. This Care Team includes your primary Heart Failure Specialized Cardiologist (physician), Advanced Practice Providers (APPs- Physician Assistants and Nurse Practitioners), and Pharmacist who all work together to provide you with the care you need, when you need it.   You may see any of the following providers on your designated Care Team at your next follow up:  Dr Glori Bickers Dr Haynes Kerns, NP Lyda Jester, [place] [facility] [place], [place] Audry Riles, PharmD   Please be sure to bring in all your medications bottles to every appointment.   Need to Contact us:  If you have any questions or concerns before your next appointment please send [place] a message through [place] or call our office at [phone].    TO LEAVE A MESSAGE FOR THE NURSE SELECT OPTION 2, PLEASE LEAVE A MESSAGE INCLUDING: YOUR NAME DATE OF BIRTH CALL BACK NUMBER REASON FOR CALL**this is important as we prioritize the call backs  YOU WILL RECEIVE A CALL BACK THE SAME DAY AS LONG AS YOU CALL BEFORE 4:00 PM

## 2021-10-02 NOTE — Progress Notes (Signed)
ID:  Dawn Fowler, DOB 1966/07/23, MRN [id number]   Provider location: [place] Advanced Heart Failure Type of Visit: Established patient   PCP:  Hayden Rasmussen, MD  Cardiologist:  Dr. Aundra Dubin   History of Present Illness: Dawn Fowler is a 55 y.o. 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  She is on CPAP at home and 6L home oxygen (says she has been on oxygen for "years.").  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  She additionally has significant HTN.  She had been on Opsumit 10 + tadalafil 20 mg daily but ran out of this prior to her 2/21 admission. She also has a history of prior DVT after long train ride, has had 2 negative V/Q scans.  She was hospitalized for COVID-19 at [facility] in 1/21.    She was seen by [facility] Pulmonology after discharge from [place] admission and was noted to be volume overloaded, Lasix was changed to torsemide but she was unable to get to pharmacy to pick up. Subsequently, she developed progressive wt gain, nearly 20 lb and increased dyspnea, prompting her to report back to the ED in 2/21, where she was found to be in acute on chronic diastolic HF and readmitted for IV diuresis and AHF consultation. Echo in 2/21 showed LVEF 55-60%, moderate RV dilation/moderate RV dysfunction. [place] 2/21 showed elevated left and right heart filling pressures with severe pulmonary hypertension. PFTs completed showing severe restriction and severely decreased DLCO. High resolution CT was concerning  for small vessel disease, no ILD, and concerning for PAH.  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 She was continued on IV diuretics and had good response, diuresing 32 lb. She was transitioned  to torsemide 40 mg/20 mg. Opsumit and tadalafil restarted.   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  She is on Eliquis. RHC in 9/21 showed normal RA pressure and PCWP, moderate-severe pulmonary arterial hypertension.   Echo 5/22 showed EF 55% with mildly enlarged RV with mild RV dysfunction, mildly D-shaped septum, PASP 48 with normal IVC.   She presents today for followup of CHF/RV failure and pulmonary hypertension. She continues to use home oxygen and CPAP.  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  She is symptomatically stable, walks with a walker on flat ground without dyspnea.  She uses a walker when out of the house but can get around in the house without walker.  Weight is up 5 lbs. No chest pain.  No lightheadedness.      Labs (6/21): K 4.5, creatinine 2.09 Labs (8/21): K 4.3, creatinine 2.11 Labs (9/21): K 3.9, creatinine 2.0 Labs (10/21): BNP 58 Labs (12/21): K 3.8, creatinine 2.28 Labs (1/22): BNP 160, K 3.7, creatinine 1.79 Labs (3/22): BNP 15.7, K 3.9, creatinine 2.33 Labs (5/22): K 3.8, creatinine 2.25, BNP 117 Labs (8/22): K 3.9, creatinine 2.16, BNP 198  6 minute walk (9/21): 168 m 6 minute walk (10/21): 122 m 6 minute walk (3/22): 107 m  PMH: 1. OHS/OSA: Uses home oxygen.  2. CKD stage IV 3. COVID-19 PNA in 12/20 4. Restrictive lung disease: PFTs (2/21) with  severe restriction, severely decreased DLCO.  - High resolution CT chest (2/21): no interstitial lung disease, "small vessel disease."  5. Gout 6. HTN 7. DVT: Remote, after train ride.  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  8. Pulmonary hypertension: RHC (2/21) with mean RA 14, PA 89/33 mean 54, mean PCWP 34, CI 2.22, PVR 3.87 WU => mixed pulmonary arterial/pulmonary venous hypertension.  - V/Q scan (2/21): No evidence for chronic PE.  - Echo (2/21): EF 55-60%, moderately dilated RV with moderately decreased systolic function.  - HIV and  rheumatologic serologies negative.  - RHC (9/21): mean RA 9, PA 68/23 mean 40, mean PCWP 11, PVR 5.6 WU, no step up in oxygen saturation so no evidence for left to right shunting.  - Echo (5/22): EF 55% with mildly enlarged RV with mild RV dysfunction, mildly D-shaped septum, PASP 48 with normal IVC. 9. Depression   Current Outpatient Medications  Medication Sig Dispense Refill   acetaminophen (TYLENOL) 500 MG tablet Take 1,000 mg by mouth every 6 (six) hours as needed for moderate pain or headache.     allopurinol (ZYLOPRIM) 100 MG tablet Take 100 mg by mouth daily.     apixaban (ELIQUIS) 5 MG TABS tablet Take 1 tablet (5 mg total) by mouth 2 (two) times daily. 60 tablet 3   Biotin w/ Vitamins C & E (HAIR SKIN & NAILS GUMMIES PO) Take 2 each by mouth daily.     Calcium Carbonate Antacid (TUMS E-X PO) Take 1 tablet by mouth as needed.     Cholecalciferol (VITAMIN D3) 125 MCG (5000 UT) CAPS Take 1 capsule by mouth daily.     colchicine 0.6 MG tablet Take 0.6 mg by mouth daily as needed (gout flare).     dapagliflozin propanediol (FARXIGA) 10 MG TABS tablet Take 1 tablet (10 mg total) by mouth daily before breakfast. 30 tablet 11   diclofenac Sodium (VOLTAREN) 1 % GEL Apply 2 g topically as needed.     diphenhydramine-acetaminophen (TYLENOL PM) 25-500 MG TABS tablet Take 2 tablets by mouth at bedtime as needed (sleep).     ipratropium-albuterol (DUONEB) 0.5-2.5 (3) MG/3ML SOLN Take 3 mLs by nebulization.     macitentan (OPSUMIT) 10 MG tablet Take 1 tablet (10 mg total) by mouth daily. 30 tablet 11   OXYGEN Inhale 4-8 L into the lungs continuous.     pantoprazole (PROTONIX) 40 MG tablet Take 40 mg by mouth daily.     potassium chloride SA (KLOR-CON) 20 MEQ tablet Take 1 tablet (20 mEq total) by mouth daily. 30 tablet 3   Selexipag (UPTRAVI) 800 MCG TABS Take 1 tablet (800 mcg total) by mouth 2 (two) times daily. 60 tablet 11   sertraline (ZOLOFT) 25 MG tablet Take 1 tablet (25 mg total) by mouth  daily. 30 tablet 2   Simethicone (GAS-X PO) Take 1 tablet by mouth as needed.     spironolactone (ALDACTONE) 25 MG tablet TAKE 1/2 TABLET(12.5 MG) BY MOUTH DAILY 45 tablet 3   tadalafil, PAH, (ADCIRCA) 20 MG tablet Take 2 tablets (40 mg total) by mouth daily. 60 tablet 5   fluconazole (DIFLUCAN) 150 MG tablet Take 1 tablet (150 mg total) by mouth as needed. Take as needed for yeast infection symptoms (Patient not taking: Reported on 09/30/2021) 1 tablet 0   torsemide (DEMADEX) 20 MG tablet Take 2 tablets (40 mg total) by mouth 2 (two) times daily. 60 tablet 6   No current facility-administered  medications for this encounter.    Allergies  Allergen Reactions   Adhesive [Tape] Hives    Tolerates paper tape   Succinylcholine Other (See Comments)    Have trouble waking up   Tizanidine Hives      Social History   Socioeconomic History   Marital status: Single    Spouse name: Not on file   Number of children: Not on file   Years of education: Not on file   Highest education level: Not on file  Occupational History   Not on file  Tobacco Use   Smoking status: Former   Smokeless tobacco: Never   Tobacco comments:    only smoked for fun in highschool not even one a day  Vaping Use   Vaping Use: Never used  Substance and Sexual Activity   Alcohol use: Yes    Comment: occ   Drug use: Not Currently   Sexual activity: Yes    Partners: Male    Comment: 1st intercourse- 6, partners- 33, current partner- 10 yrs   Other Topics Concern   Not on file  Social History Narrative   Not on file   Social Determinants of Health   Financial Resource Strain: High Risk   Difficulty of Paying Living Expenses: Very hard  Food Insecurity: Food Insecurity Present   Worried About Charity fundraiser in the Last Year: Sometimes true   Ran Out of Food in the Last Year: Never true  Transportation Needs: No Transportation Needs   Lack of Transportation (Medical): No   Lack of Transportation  (Non-Medical): No  Physical Activity: Not on file  Stress: Not on file  Social Connections: Not on file  Intimate Partner Violence: Not on file      Family History  Problem Relation Age of Onset   COPD Father    Cancer Mother        gallbladder and bladder    Hypertension Brother    Breast cancer Other    Diabetes Son    Breast cancer Cousin    Breast cancer Cousin    Pulmonary Hypertension Neg Hx     Vitals:   09/30/21 1208  BP: 122/80  Pulse: 81  SpO2: 97%  Weight: 114.9 kg (253 lb 3.2 oz)   Wt Readings from Last 3 Encounters:  09/30/21 114.9 kg (253 lb 3.2 oz)  09/02/21 117.3 kg (258 lb 9.6 oz)  08/02/21 115.3 kg (254 lb 3.2 oz)   Exam:   BP 122/80   Pulse 81   Wt 114.9 kg (253 lb 3.2 oz)   LMP  (LMP Unknown) Comment: pmb last 2-3 months  SpO2 97%   BMI 47.84 kg/m  General: NAD Neck: No JVD, no thyromegaly or thyroid nodule.  Lungs: Clear to auscultation bilaterally with normal respiratory effort. CV: Nondisplaced PMI.  Heart regular S1/S2, no S3/S4, no murmur.  No peripheral edema.  No carotid bruit.  Normal pedal pulses.  Abdomen: Soft, nontender, no hepatosplenomegaly, no distention.  Skin: Intact without lesions or rashes.  Neurologic: Alert and oriented x 3.  Psych: Normal affect. Extremities: No clubbing or cyanosis.  HEENT: Normal.   ASSESSMENT & PLAN:  1. Chronic diastolic CHF with significant RV dysfunction: Echo in 2/21 with LVEF 55-60%, moderate RV dilation/moderate RV dysfunction.  [place] 2/18 showed elevated left and right heart filling pressures with severe pulmonary hypertension.  Suspect mixed pulmonary venous/pulmonary arterial hypertension in setting of significant LV diastolic dysfunction (PVR 3.87 WU).  Repeat RHC in  9/21 with moderate-severe PAH, normal filling pressures, and PVR 5.6 WU.  No evidence for left=>right shunting.  Echo in 5/22 showed EF 55% with mildly enlarged RV with mild RV dysfunction, mildly D-shaped septum, PASP 48 with  normal IVC.  NYHA class III symptoms today, similar to prior.  Weight is up 5 lbs but she does not look markedly volume overloaded.   - Continue torsemide 20 qam/40 qpm and check BMET today.    - Continue Farxiga 10 mg daily.  2. Pulmonary hypertension: Suspect that this is a mixed picture with group 2 and group 3 PH (related to diastolic LV dysfunction and OHS/OSA).  However, concerned for component of group 1 PAH as well.  V/Q scan not suggestive of chronic PEs and high resolution CT chest in 2/21 was not suggestive of ILD. HIV and rheumatologic serologies negative.  RHC showed severe mixed pulmonary venous/pulmonary arterial hypertension, repeat RHC after diuresis in 9/21 showed PAH. PFTs completed in 2/21 showed severe restriction, severely decreased DLCO (?restriction due to body habitus).  Echo in 5/22 showed mild improvement in RV and improvement in estimated PA systolic pressure.  She is on Opsumit, tadalafil and Uptravi 800 mcg bid.   - We will do 6 minute walk today.  - Continue to maintain oxygen saturation with home O2 and CPAP. Weight loss also would be helpful.  She is planning on a breast reduction surgery which may decrease lung restriction.  - Continue Opsumit 10 mg daily. - Continue tadalafil 40 mg daily.  - She is tolerating Uptravi 800 mcg bid now, she has not tolerate uptitration.  - Check BNP today 3. HTN: BP controlled on current regimen.  4. CKD stage 3 - BMET today.  - Followup with nephrology.  5. OHS/OSA: compliant w/ CPAP and home oxygen.  6. 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  As she is at baseline sedentary and has had recurrent DVTs, would continue Eliquis long-term.   7. Restrictive lung disease: Large breasts play a role, she is planned for breast reduction surgery.  I think that this would be a reasonable thing for her.  I think she could tolerate this surgery from a cardiac standpoint but should also be cleared by pulmonary given OHS/OSA.   Followup  in 6 wks with APP.   Signed, [name], MD  10/02/2021  [place] [address] [facility] [place] [address] [phone] (office) [phone] (fax)

## 2021-10-11 ENCOUNTER — Other Ambulatory Visit (HOSPITAL_COMMUNITY): Payer: Self-pay

## 2021-10-11 ENCOUNTER — Telehealth (HOSPITAL_COMMUNITY): Payer: Self-pay | Admitting: Pharmacist

## 2021-10-11 NOTE — Telephone Encounter (Signed)
Advanced Heart Failure Patient Advocate Encounter  Prior Authorizations for Uptravi, Opsumit and tadalafil have been approved.     Effective dates: 10/30/21 through 10/29/22  Audry Riles, PharmD, BCPS, BCCP, CPP Heart Failure Clinic Pharmacist [phone]

## 2021-10-11 NOTE — Telephone Encounter (Signed)
Patient Advocate Encounter   Received notification from [employer] that prior authorizations for Opsumit, Uptravi and tadalafil are required.   PAs submitted on CoverMyMeds   Will continue to follow.   Audry Riles, PharmD, BCPS, BCCP, CPP Heart Failure Clinic Pharmacist [phone]

## 2021-10-19 ENCOUNTER — Encounter (HOSPITAL_COMMUNITY): Payer: Self-pay | Admitting: Cardiology

## 2021-11-01 ENCOUNTER — Ambulatory Visit: Payer: Medicare Other | Admitting: Primary Care

## 2021-11-03 ENCOUNTER — Ambulatory Visit: Payer: Medicare Other | Admitting: Obstetrics & Gynecology

## 2021-11-03 DIAGNOSIS — Z0289 Encounter for other administrative examinations: Secondary | ICD-10-CM

## 2021-11-07 ENCOUNTER — Other Ambulatory Visit: Payer: Self-pay

## 2021-11-07 ENCOUNTER — Encounter: Payer: Self-pay | Admitting: Internal Medicine

## 2021-11-07 ENCOUNTER — Ambulatory Visit (INDEPENDENT_AMBULATORY_CARE_PROVIDER_SITE_OTHER): Payer: Medicare Other | Admitting: Internal Medicine

## 2021-11-07 ENCOUNTER — Telehealth: Payer: Self-pay | Admitting: Pulmonary Disease

## 2021-11-07 ENCOUNTER — Ambulatory Visit (INDEPENDENT_AMBULATORY_CARE_PROVIDER_SITE_OTHER): Payer: Medicare Other

## 2021-11-07 VITALS — BP 140/90 | HR 78 | Temp 98.9°F | Ht 61.0 in | Wt 255.0 lb

## 2021-11-07 DIAGNOSIS — J069 Acute upper respiratory infection, unspecified: Secondary | ICD-10-CM | POA: Diagnosis not present

## 2021-11-07 DIAGNOSIS — K219 Gastro-esophageal reflux disease without esophagitis: Secondary | ICD-10-CM | POA: Diagnosis not present

## 2021-11-07 DIAGNOSIS — J9611 Chronic respiratory failure with hypoxia: Secondary | ICD-10-CM | POA: Diagnosis not present

## 2021-11-07 DIAGNOSIS — R0602 Shortness of breath: Secondary | ICD-10-CM

## 2021-11-07 DIAGNOSIS — I272 Pulmonary hypertension, unspecified: Secondary | ICD-10-CM

## 2021-11-07 LAB — POCT INFLUENZA A/B
Influenza A, POC: NEGATIVE
Influenza B, POC: NEGATIVE

## 2021-11-07 LAB — POC COVID19 BINAXNOW: SARS Coronavirus 2 Ag: NEGATIVE

## 2021-11-07 MED ORDER — FAMOTIDINE 20 MG PO TABS: 20.0000 mg | ORAL_TABLET | Freq: Two times a day (BID) | ORAL | 3 refills | Status: DC

## 2021-11-07 NOTE — Patient Instructions (Addendum)
Please schedule follow up scheduled with myself in 2-3 weeks.  If my schedule is not open yet, we will contact you with a reminder closer to that time. Please call [phone] if you haven't heard from [place] a month before.   Follow up with Dr. Loanne Drilling about your surgical evaluation in a couple of weeks once you are feeling better.   Use nasal water based gel like AYR ocean or KY jelly. Use that on a Q-tip inside your nose, do not use anything oil based or petroleum jelly based.   I have sent pepcid to your pharmacy for reflux.   Keep down plenty of fluids.  Increase your albuterol nebulizer use, you can use this up to 4 times a day as needed for shortness of breath.

## 2021-11-07 NOTE — Telephone Encounter (Signed)
Spoke with pt  She is c/o cough/congestion over the past wk  She says she coughed up some green/bloody sputum today after brushing her teeth  She states she feels more SOB  No fever, aches  Covid test was neg  OV with Dr Shearon Stalls was scheduled for today for eval

## 2021-11-07 NOTE — Progress Notes (Signed)
Dawn Fowler    [id number]    [birth year]  Primary Care Physician:Richter, Maebelle Munroe, MD Date of Appointment: 11/07/2021 Established Patient Visit  Chief complaint:   Chief Complaint  Patient presents with   Acute Visit    Cough and congestion     HPI: Dawn Fowler is a 56 y.o. woman with severe pulmonary hypertension, severe OSA, recurrent DVT on anticoagulation and stage III CDK. Sees Dr. Loanne Drilling. She is on pulmonary vasodilators and sees Dr Aundra Dubin.   Interval Updates: Here for sick visit. Body aches, coughing, congestion, bloody nose and coughing up blood. Sore throat.  Symptoms started new years eve.  Chronically on 4-6L [place]. She has chronic dry nose and intermittent epistaxis  Having diarrhea, taking immodium  Keeping down enough fluids and eating ok. No nausea or vomiting.    55 yo [place] daughter came to visit prior to symptom onset- possible sick contact there.   Using castor oil in her nares for moisturization  I have reviewed the patient's family social and past medical history and updated as appropriate.   Past Medical History:  Diagnosis Date   Acute respiratory failure due to COVID-19 [facility]) 10/30/2019   Arthritis    CHF (congestive heart failure) (HCC)    CKD (chronic kidney disease), stage III (HCC)    Complication of anesthesia    hard to wake up after general anesthesia - had trouble waking up after succinylcholine (consider possibility of pseudocholinesterase deficiency)   COVID-19 virus detected 11/20/2019   Tested positive for Covid in December/2020 Required hospitalization in December/2020 through January/2021 Received Redemsivir   Dyspnea    Family history of adverse reaction to anesthesia    hard to wake up after general anesthesia   GERD (gastroesophageal reflux disease)    HLD (hyperlipidemia)    HTN (hypertension)    Pre-diabetes    no meds   Pulmonary HTN ([place])    Sleep apnea    on CPAP    Past Surgical History:  Procedure  Laterality Date   CARPAL TUNNEL RELEASE Bilateral    CESAREAN SECTION     COLONOSCOPY WITH PROPOFOL N/A 03/12/2020   Procedure: COLONOSCOPY WITH PROPOFOL;  Surgeon: Carol Ada, MD;  Location: [facility];  Service: Endoscopy;  Laterality: N/A;   DILATATION & CURETTAGE/HYSTEROSCOPY WITH MYOSURE N/A 04/08/2020   Procedure: [place];  Surgeon: Princess Bruins, MD;  Location: [place];  Service: Gynecology;  Laterality: N/A;  request 8:30am OR start-requested through [place] for [place] requests one hour   HEMOSTASIS CLIP PLACEMENT  03/12/2020   Procedure: HEMOSTASIS CLIP PLACEMENT;  Surgeon: Carol Ada, MD;  Location: [facility];  Service: Endoscopy;;   LEG SURGERY Right 1988   metal rod   lumbar surgery     POLYPECTOMY  03/12/2020   Procedure: POLYPECTOMY;  Surgeon: Carol Ada, MD;  Location: [facility];  Service: Endoscopy;;   RIGHT HEART CATH N/A 12/18/2019   Procedure: RIGHT HEART CATH;  Surgeon: Larey Dresser, MD;  Location: [place];  Service: Cardiovascular;  Laterality: N/A;   RIGHT HEART CATH N/A 07/12/2020   Procedure: RIGHT HEART CATH;  Surgeon: Larey Dresser, MD;  Location: [place] [facility];  Service: Cardiovascular;  Laterality: N/A;    Family History  Problem Relation Age of Onset   COPD Father    Cancer Mother        gallbladder and bladder    Hypertension Brother  Breast cancer Other    Diabetes Son    Breast cancer Cousin    Breast cancer Cousin    Pulmonary Hypertension Neg Hx     Social History   Occupational History   Not on file  Tobacco Use   Smoking status: Former   Smokeless tobacco: Never   Tobacco comments:    only smoked for fun in highschool not even one a day  Vaping Use   Vaping Use: Never used  Substance and Sexual Activity   Alcohol use: Yes    Comment: occ   Drug use: Not Currently   Sexual activity: Yes    Partners: Male    Comment: 1st intercourse- 20, partners-  61, current partner- 10 yrs      Physical Exam: Blood pressure 140/90, pulse 78, temperature 98.9 F (37.2 C), temperature source Oral, height 5\' 1"  (1.549 m), weight 255 lb (115.7 kg), SpO2 94 %.  Gen:      No acute distress, appears ill and fatigued ENT:  on nasal cannula no nasal polyps, mucus membranes moist Lungs:    No increased respiratory effort, symmetric chest wall excursion, clear to auscultation bilaterally, no wheezes or crackles CV:         Regular rate and rhythm; no murmurs, rubs, or gallops.  No pedal edema   Data Reviewed: Imaging: I have personally reviewed the chest xray today which shows cardiomegaly. Pulmonary vascular congestion, maybe mild RUL atelectasis but no pneumonia.   PFTs:  PFT Results Latest Ref Rng & Units 12/21/2019  FVC-Pre L 1.25  FVC-Predicted Pre % 42  FVC-Post L 1.43  FVC-Predicted Post % 48  Pre FEV1/FVC % % 87  Post FEV1/FCV % % 89  FEV1-Pre L 1.09  FEV1-Predicted Pre % 46  FEV1-Post L 1.27  DLCO uncorrected ml/min/mmHg 7.00  DLCO UNC% % 32  DLCO corrected ml/min/mmHg 6.84  DLCO COR %Predicted % 31  DLVA Predicted % 66  TLC L 3.35  TLC % Predicted % 64  RV % Predicted % 99   I have personally reviewed the patient's PFTs and they show restriction to ventilation with +BD response.   Labs:  Immunization status: Immunization History  Administered Date(s) Administered   Fluad Quad(high Dose 65+) 09/20/2020   Influenza Whole 08/20/2019   Influenza,inj,Quad PF,6+ Mos 06/25/2018   PFIZER(Purple Top)SARS-COV-2 Vaccination 02/02/2020, 02/24/2020, 09/27/2020   Pneumococcal Polysaccharide-23 07/17/2014    External Records Personally Reviewed: cardiology, pulmonary  Assessment:  Dawn Fowler is a 56 y.o. woman with   Acute viral illness  Acute on Chronic HFpEF and RV failure Pulmonary Hypertension on Pulmonary vasodilator therapy Chronic respiratory failure OHS/OSA GERD   Plan/Recommendations:  I ordered a rapid flu and  covid test today. Both were negative.  Chest xray reviewed - no significant change, no lobar pneumonia.  Recommend pepcid for GERD - can discuss PPI trial if no improvement She does not need to be hospitalized - she is at baseline oxygen support.  Start taking albuterol nebs up to 4 times a day as needed for dyspnea related to URI  Use water based gel in nares for moisture - stop oil based products.   Follow up with Dr. Loanne Drilling about pre-operative evaluation for breast reduction surgery at next visit.   I spent 30 minutes on 11/07/2021 in care of this patient including face to face time and non-face to face time spent charting, review of outside records, and coordination of care.   Return to Care: Return in about  2 weeks (around 11/21/2021).   Lenice Llamas, MD Pulmonary and [place]

## 2021-11-08 ENCOUNTER — Telehealth: Payer: Self-pay | Admitting: Internal Medicine

## 2021-11-08 NOTE — Telephone Encounter (Signed)
Spoke with pt and reviewed AVS from last visit which stated famotine (pepic) which available OTC and albuterol nebs which pt states she does not need a refill for. Pt also stated Dr. Shearon Stalls told her she did not need OV on the 11/10/21 so visit was canceled. Nothing further needed at this time.

## 2021-11-11 ENCOUNTER — Ambulatory Visit: Payer: Medicare Other | Admitting: Primary Care

## 2021-11-14 ENCOUNTER — Other Ambulatory Visit (HOSPITAL_COMMUNITY): Payer: Self-pay

## 2021-11-14 ENCOUNTER — Telehealth (HOSPITAL_COMMUNITY): Payer: Self-pay | Admitting: Pharmacy Technician

## 2021-11-14 MED ORDER — OPSUMIT 10 MG PO TABS: 10.0000 mg | ORAL_TABLET | Freq: Every day | ORAL | 3 refills | Status: DC

## 2021-11-14 MED ORDER — TADALAFIL (PAH) 20 MG PO TABS: 40.0000 mg | ORAL_TABLET | Freq: Every day | ORAL | 3 refills | Status: DC

## 2021-11-14 NOTE — Telephone Encounter (Signed)
Advanced Heart Failure Patient Advocate Encounter  Patient was approved for a TAF grant that will cover the cost of Uptravi, Opsumit and Tadalafil.   Member Number [id number]  Group Number [id number] Coverage from date 10/30/2021 Coverage to date 10/29/2022 Claims Processing [place] PCN: [id number] BIN: [id number] Processing: 08 Phone: [phone] Fax: [phone]  [facility] RX request to send to [place].  [name], CPhT

## 2021-11-15 ENCOUNTER — Encounter (HOSPITAL_COMMUNITY): Payer: Medicare Other

## 2021-11-25 ENCOUNTER — Ambulatory Visit (HOSPITAL_COMMUNITY)
Admission: RE | Admit: 2021-11-25 | Discharge: 2021-11-25 | Disposition: A | Discharge status: Home / Self Care | Payer: Medicare Other | Source: Ambulatory Visit | Attending: Family Medicine | Admitting: Family Medicine

## 2021-11-25 ENCOUNTER — Encounter (HOSPITAL_COMMUNITY): Payer: Self-pay

## 2021-11-25 ENCOUNTER — Ambulatory Visit (INDEPENDENT_AMBULATORY_CARE_PROVIDER_SITE_OTHER): Payer: Medicare Other | Admitting: Plastic Surgery

## 2021-11-25 ENCOUNTER — Other Ambulatory Visit: Payer: Self-pay

## 2021-11-25 ENCOUNTER — Encounter: Payer: Self-pay | Admitting: Plastic Surgery

## 2021-11-25 VITALS — BP 169/96 | HR 91 | Ht 61.0 in | Wt 257.0 lb

## 2021-11-25 VITALS — BP 132/80 | HR 86 | Wt 257.6 lb

## 2021-11-25 DIAGNOSIS — Y939 Activity, unspecified: Secondary | ICD-10-CM | POA: Insufficient documentation

## 2021-11-25 DIAGNOSIS — I10 Essential (primary) hypertension: Secondary | ICD-10-CM | POA: Diagnosis not present

## 2021-11-25 DIAGNOSIS — J984 Other disorders of lung: Secondary | ICD-10-CM | POA: Insufficient documentation

## 2021-11-25 DIAGNOSIS — N62 Hypertrophy of breast: Secondary | ICD-10-CM | POA: Insufficient documentation

## 2021-11-25 DIAGNOSIS — J9611 Chronic respiratory failure with hypoxia: Secondary | ICD-10-CM

## 2021-11-25 DIAGNOSIS — Z8616 Personal history of COVID-19: Secondary | ICD-10-CM | POA: Insufficient documentation

## 2021-11-25 DIAGNOSIS — Z6841 Body Mass Index (BMI) 40.0 and over, adult: Secondary | ICD-10-CM | POA: Insufficient documentation

## 2021-11-25 DIAGNOSIS — S025XXA Fracture of tooth (traumatic), initial encounter for closed fracture: Secondary | ICD-10-CM | POA: Insufficient documentation

## 2021-11-25 DIAGNOSIS — I272 Pulmonary hypertension, unspecified: Secondary | ICD-10-CM | POA: Diagnosis not present

## 2021-11-25 DIAGNOSIS — Z7901 Long term (current) use of anticoagulants: Secondary | ICD-10-CM | POA: Insufficient documentation

## 2021-11-25 DIAGNOSIS — I130 Hypertensive heart and chronic kidney disease with heart failure and stage 1 through stage 4 chronic kidney disease, or unspecified chronic kidney disease: Secondary | ICD-10-CM | POA: Diagnosis not present

## 2021-11-25 DIAGNOSIS — E662 Morbid (severe) obesity with alveolar hypoventilation: Secondary | ICD-10-CM | POA: Insufficient documentation

## 2021-11-25 DIAGNOSIS — Z79899 Other long term (current) drug therapy: Secondary | ICD-10-CM | POA: Diagnosis not present

## 2021-11-25 DIAGNOSIS — N183 Chronic kidney disease, stage 3 unspecified: Secondary | ICD-10-CM

## 2021-11-25 DIAGNOSIS — N184 Chronic kidney disease, stage 4 (severe): Secondary | ICD-10-CM | POA: Insufficient documentation

## 2021-11-25 DIAGNOSIS — Z9981 Dependence on supplemental oxygen: Secondary | ICD-10-CM | POA: Insufficient documentation

## 2021-11-25 DIAGNOSIS — Z8249 Family history of ischemic heart disease and other diseases of the circulatory system: Secondary | ICD-10-CM | POA: Diagnosis not present

## 2021-11-25 DIAGNOSIS — Z86718 Personal history of other venous thrombosis and embolism: Secondary | ICD-10-CM | POA: Diagnosis not present

## 2021-11-25 DIAGNOSIS — G4733 Obstructive sleep apnea (adult) (pediatric): Secondary | ICD-10-CM

## 2021-11-25 DIAGNOSIS — Z7984 Long term (current) use of oral hypoglycemic drugs: Secondary | ICD-10-CM | POA: Diagnosis not present

## 2021-11-25 DIAGNOSIS — Z9989 Dependence on other enabling machines and devices: Secondary | ICD-10-CM | POA: Insufficient documentation

## 2021-11-25 DIAGNOSIS — I2721 Secondary pulmonary arterial hypertension: Secondary | ICD-10-CM | POA: Insufficient documentation

## 2021-11-25 DIAGNOSIS — I5032 Chronic diastolic (congestive) heart failure: Secondary | ICD-10-CM

## 2021-11-25 LAB — BASIC METABOLIC PANEL
Anion gap: 8 (ref 5–15)
BUN: 27 mg/dL — ABNORMAL HIGH (ref 6–20)
CO2: 22 mmol/L (ref 22–32)
Calcium: 9.3 mg/dL (ref 8.9–10.3)
Chloride: 110 mmol/L (ref 98–111)
Creatinine, Ser: 1.94 mg/dL — ABNORMAL HIGH (ref 0.44–1.00)
GFR, Estimated: 30 mL/min — ABNORMAL LOW (ref >60)
Glucose, Bld: 93 mg/dL (ref 70–99)
Potassium: 4.7 mmol/L (ref 3.5–5.1)
Sodium: 140 mmol/L (ref 135–145)

## 2021-11-25 LAB — BRAIN NATRIURETIC PEPTIDE: B Natriuretic Peptide: 275.5 pg/mL — ABNORMAL HIGH (ref 0.0–100.0)

## 2021-11-25 MED ORDER — APIXABAN 5 MG PO TABS: 5.0000 mg | ORAL_TABLET | Freq: Two times a day (BID) | ORAL | 9 refills | Status: DC

## 2021-11-25 NOTE — H&P (View-Only) (Signed)
CC:  Breast hypertrophy and back pain   Dawn Fowler is an 56 y.o. female.  HPI: Mammary Hyperplasia: The patient is a 56 y.o. female with a history of mammary hyperplasia for several years.  She has extremely large breasts causing symptoms that include the following: Back pain in the upper and lower back, including neck pain. She pulls or pins her bra straps to provide better lift and relief of the pressure and pain. She notices relief by holding her breast up manually.  Her shoulder straps cause grooves and pain and pressure that requires padding for relief. Pain medication is sometimes required with motrin and tylenol.  Activities that are hindered by enlarged breasts include: exercise and running.  In addition she notes that she is on home oxygen and feels that her large breasts restricts her breathing.  She has a diagnosis of pulmonary hypertension and is on 4 to 8 L of home oxygen.  She has discussed breast reduction with her pulmonologist Dr. Loanne Drilling who she states is supportive of her having breast reduction surgery.   Dr. Aundra Dubin  who is her cardiologist.  Dr. Marigene Ehlers also manages her Eliquis has stated 48 hours off Eliquis is acceptable.  She has tried supportive clothing as well as fitted bras without improvement.  She denies any tobacco use.   Her breasts are extremely large and fairly symmetric.  She has hyperpigmentation of the inframammary area on both sides.  Preoperative bra size = DDD cup.   Mammogram history: Normal mammogram June 2022.   Tobacco use:  none.   The patient expresses the desire to pursue surgical intervention.         Allergies  Allergen Reactions   Adhesive [Tape] Hives      Tolerates paper tape   Succinylcholine Other (See Comments)      Have trouble waking up   Tizanidine Hives           Outpatient Encounter Medications as of 09/02/2021  Medication Sig Note   acetaminophen (TYLENOL) 500 MG tablet Take 1,000 mg by mouth every 6 (six) hours as needed for  moderate pain or headache.     allopurinol (ZYLOPRIM) 100 MG tablet Take 100 mg by mouth daily.     apixaban (ELIQUIS) 5 MG TABS tablet Take 1 tablet (5 mg total) by mouth 2 (two) times daily.     Biotin w/ Vitamins C & E (HAIR SKIN & NAILS GUMMIES PO) Take 2 each by mouth daily.     Calcium Carbonate Antacid (TUMS E-X PO) Take 1 tablet by mouth as needed.     Cholecalciferol (VITAMIN D3) 125 MCG (5000 UT) CAPS Take 1 capsule by mouth daily.     colchicine 0.6 MG tablet Take 0.6 mg by mouth daily as needed (gout flare).     dapagliflozin propanediol (FARXIGA) 10 MG TABS tablet Take 1 tablet (10 mg total) by mouth daily before breakfast.     diclofenac Sodium (VOLTAREN) 1 % GEL Apply 2 g topically as needed.     diphenhydramine-acetaminophen (TYLENOL PM) 25-500 MG TABS tablet Take 2 tablets by mouth at bedtime as needed (sleep).     fluconazole (DIFLUCAN) 150 MG tablet Take 1 tablet (150 mg total) by mouth as needed. Take as needed for yeast infection symptoms     ipratropium-albuterol (DUONEB) 0.5-2.5 (3) MG/3ML SOLN Take 3 mLs by nebulization.     macitentan (OPSUMIT) 10 MG tablet Take 1 tablet (10 mg total) by mouth daily.  OXYGEN Inhale 4-8 L into the lungs continuous.     pantoprazole (PROTONIX) 40 MG tablet Take 40 mg by mouth daily.     potassium chloride SA (KLOR-CON) 20 MEQ tablet Take 20 mEq by mouth daily.  11/11/2020: .   Selexipag (UPTRAVI) 800 MCG TABS Take 1 tablet (800 mcg total) by mouth 2 (two) times daily.     sertraline (ZOLOFT) 25 MG tablet Take 1 tablet (25 mg total) by mouth daily.     Simethicone (GAS-X PO) Take 1 tablet by mouth as needed.     spironolactone (ALDACTONE) 25 MG tablet TAKE 1/2 TABLET(12.5 MG) BY MOUTH DAILY     tadalafil, PAH, (ADCIRCA) 20 MG tablet Take 2 tablets (40 mg total) by mouth daily.     torsemide (DEMADEX) 20 MG tablet Take 1 tablet (20 mg total) by mouth every morning AND 2 tablets (40 mg total) every evening.      No facility-administered  encounter medications on file as of 09/02/2021.          Past Medical History:  Diagnosis Date   Acute respiratory failure due to COVID-19 [facility]) 10/30/2019   Arthritis     CHF (congestive heart failure) (HCC)     CKD (chronic kidney disease), stage III (HCC)     Complication of anesthesia      hard to wake up after general anesthesia - had trouble waking up after succinylcholine (consider possibility of pseudocholinesterase deficiency)   COVID-19 virus detected 11/20/2019    Tested positive for Covid in December/2020 Required hospitalization in December/2020 through January/2021 Received Redemsivir   Dyspnea     Family history of adverse reaction to anesthesia      hard to wake up after general anesthesia   GERD (gastroesophageal reflux disease)     HLD (hyperlipidemia)     HTN (hypertension)     Pre-diabetes      no meds   Pulmonary HTN ([place])     Sleep apnea      on CPAP           Past Surgical History:  Procedure Laterality Date   CARPAL TUNNEL RELEASE Bilateral     CESAREAN SECTION       COLONOSCOPY WITH PROPOFOL N/A 03/12/2020    Procedure: COLONOSCOPY WITH PROPOFOL;  Surgeon: Carol Ada, MD;  Location: [facility];  Service: Endoscopy;  Laterality: N/A;   DILATATION & CURETTAGE/HYSTEROSCOPY WITH MYOSURE N/A 04/08/2020    Procedure: [place];  Surgeon: Princess Bruins, MD;  Location: [place];  Service: Gynecology;  Laterality: N/A;  request 8:30am OR start-requested through [place] for [place] requests one hour   HEMOSTASIS CLIP PLACEMENT   03/12/2020    Procedure: HEMOSTASIS CLIP PLACEMENT;  Surgeon: Carol Ada, MD;  Location: [facility];  Service: Endoscopy;;   LEG SURGERY Right 1988    metal rod   lumbar surgery       POLYPECTOMY   03/12/2020    Procedure: POLYPECTOMY;  Surgeon: Carol Ada, MD;  Location: [facility];  Service: Endoscopy;;   RIGHT HEART CATH N/A 12/18/2019    Procedure: RIGHT HEART CATH;   Surgeon: Larey Dresser, MD;  Location: [place] [facility];  Service: Cardiovascular;  Laterality: N/A;   RIGHT HEART CATH N/A 07/12/2020    Procedure: RIGHT HEART CATH;  Surgeon: Larey Dresser, MD;  Location: [place] [facility];  Service: Cardiovascular;  Laterality: N/A;           Family  History  Problem Relation Age of Onset   COPD Father     Cancer Mother          gallbladder and bladder    Hypertension Brother     Breast cancer Other     Diabetes Son     Breast cancer Cousin     Breast cancer Cousin     Pulmonary Hypertension Neg Hx        Social history: No tobacco use, no illicit drug use, presents with caretaker.  She works from home.     Review of Systems General: Denies fevers, chills, weight loss CV: Denies chest pain, shortness of breath, palpitations     Physical Exam Vitals with BMI 09/02/2021 08/02/2021 06/27/2021  Height 5\' 1"  5\' 1"  -  Weight 258 lbs 10 oz 254 lbs 3 oz 248 lbs 13 oz  BMI 37.09 64.38 -  Systolic 381 840 375  Diastolic 86 64 90  Pulse - 99 66    General:  No acute distress,  Alert and oriented, Non-Toxic, Normal speech and affect Breast: Patient has very large breasts.  She has severe ptosis.  No breast masses were easily palpable on exam.  The sternal to nipple distance on the right is 45 cm and the left is 45 cm.  The IMF distance is 19 cm bilaterally.   Assessment/Plan Risks and benefits discussed and the patient wishes to proceed despite discussing that this could have a high risk of all complications and even death due to her comorbidities.  She wants to proceed since she thinks this may help her breathing and mobility and ultimately improve her morbidity.

## 2021-11-25 NOTE — Progress Notes (Signed)
ID:  Dawn Fowler, DOB 1966-05-06, MRN [id number]   Provider location: [place] [facility] Type of Visit: Established patient   PCP:  Hayden Rasmussen, MD  Cardiologist:  Dr. Aundra Dubin   History of Present Illness: Dawn Fowler is a 56 y.o. 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  She is on CPAP at home and 6L home oxygen (says she has been on oxygen for "years.").  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  She additionally has significant HTN.  She had been on Opsumit 10 + tadalafil 20 mg daily but ran out of this prior to her 2/21 admission. She also has a history of prior DVT after long train ride, has had 2 negative V/Q scans.  She was hospitalized for COVID-19 at [facility] in 1/21.    She was seen by [facility] Pulmonology after discharge from [place] admission and was noted to be volume overloaded, Lasix was changed to torsemide but she was unable to get to pharmacy to pick up. Subsequently, she developed progressive wt gain, nearly 20 lb and increased dyspnea, prompting her to report back to the ED in 2/21, where she was found to be in acute on chronic diastolic HF and readmitted for IV diuresis and AHF consultation. Echo in 2/21 showed LVEF 55-60%, moderate RV dilation/moderate RV dysfunction. [place] 2/21 showed elevated left and right heart filling pressures with severe pulmonary hypertension. PFTs completed showing severe restriction and severely decreased DLCO. High resolution CT was concerning  for small vessel disease, no ILD, and concerning for PAH.  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 She was continued on IV diuretics and had good response, diuresing 32 lb. She was transitioned  to torsemide 40 mg/20 mg. Opsumit and tadalafil restarted.   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  She is on Eliquis. RHC in 9/21 showed normal RA pressure and PCWP, moderate-severe pulmonary arterial hypertension.   Echo 5/22 showed EF 55% with mildly enlarged RV with mild RV dysfunction, mildly D-shaped septum, PASP 48 with normal IVC.   Follow up 12/22, weight was slightly up but stable NYHA III symptoms, did not appear volume overloaded, bnp 42. No medication adjustments.   Today she returns for HF follow up with her friend. Recovering from [place] 2 weeks ago. She remains mildly dyspneic walking on flat ground with her rolling walker. Continues to use home O2 and CPAP. 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 She has her breast reduction surgery scheduled for next month. Denies CP, dizziness, edema, or PND/Orthopnea. Appetite ok. No fever or chills.Taking all medications.   Labs (6/21): K 4.5, creatinine 2.09 Labs (8/21): K 4.3, creatinine 2.11 Labs (9/21): K 3.9, creatinine 2.0 Labs (10/21): BNP 58 Labs (12/21): K 3.8, creatinine 2.28 Labs (1/22): BNP 160, K 3.7, creatinine 1.79 Labs (3/22): BNP 15.7, K 3.9, creatinine 2.33 Labs (5/22): K 3.8, creatinine 2.25, BNP 117 Labs (8/22): K 3.9, creatinine 2.16, BNP 198 Labs (12/22): K 4.1, creatinine 2.32  6 minute walk (9/21): 168 m 6 minute walk (10/21): 122 m 6 minute walk (3/22): 107 m  PMH: 1. OHS/OSA: Uses home oxygen.  2. CKD stage IV 3. COVID-19 PNA in 12/20 4. Restrictive lung disease: PFTs (2/21) with severe restriction, severely decreased DLCO.  - High resolution CT chest (2/21): no interstitial lung disease, "small vessel disease."  5. Gout 6. HTN 7. DVT: Remote, after train ride.  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  8. Pulmonary hypertension: RHC (2/21) with mean RA 14, PA 89/33 mean 54, mean PCWP 34, CI 2.22, PVR 3.87 WU => mixed pulmonary arterial/pulmonary  venous hypertension.  - V/Q scan (2/21): No evidence for chronic PE.  - Echo (2/21): EF 55-60%, moderately dilated RV with moderately decreased systolic function.  - HIV and rheumatologic serologies negative.  - RHC (9/21): mean RA 9, PA 68/23 mean 40, mean PCWP 11, PVR 5.6 WU, no step up in oxygen saturation so no evidence for left to right shunting.  - Echo (5/22): EF 55% with mildly enlarged RV with mild RV dysfunction, mildly D-shaped septum, PASP 48 with normal IVC. 9. Depression   Current Outpatient Medications  Medication Sig Dispense Refill   acetaminophen (TYLENOL) 500 MG tablet Take 1,000 mg by mouth every 6 (six) hours as needed for moderate pain or headache.     allopurinol (ZYLOPRIM) 100 MG tablet Take 100 mg by mouth daily.     apixaban (ELIQUIS) 5 MG TABS tablet Take 1 tablet (5 mg total) by mouth 2 (two) times daily. 60 tablet 3   Biotin w/ Vitamins C & E (HAIR SKIN & NAILS GUMMIES PO) Take 2 each by mouth daily.     Calcium Carbonate Antacid (TUMS E-X PO) Take 1 tablet by mouth as needed.     Cholecalciferol (VITAMIN D3) 125 MCG (5000 UT) CAPS Take 1 capsule by mouth daily.     colchicine 0.6 MG tablet Take 0.6 mg by mouth daily as needed (gout flare).     dapagliflozin propanediol (FARXIGA) 10 MG TABS tablet Take 1 tablet (10 mg total) by mouth daily before breakfast. 30 tablet 11   diclofenac Sodium (VOLTAREN) 1 % GEL Apply 2 g topically as needed.     diphenhydramine-acetaminophen (TYLENOL PM) 25-500 MG TABS tablet Take 2 tablets by mouth at bedtime as needed (sleep).     famotidine (PEPCID) 20 MG tablet Take 1 tablet (20 mg total) by mouth 2 (two) times daily. 30 tablet 3   ipratropium-albuterol (DUONEB) 0.5-2.5 (3) MG/3ML SOLN Take 3 mLs by nebulization.     macitentan (OPSUMIT) 10 MG tablet Take 1 tablet (10 mg total) by mouth daily. 90 tablet 3   OXYGEN Inhale 4-8 L into the lungs continuous.     potassium chloride SA (KLOR-CON) 20 MEQ tablet Take 1 tablet (20 mEq  total) by mouth daily. 30 tablet 3   Selexipag (UPTRAVI) 800 MCG TABS Take 1 tablet (800 mcg total) by mouth 2 (two) times daily. 60 tablet 11   sertraline (ZOLOFT) 25 MG tablet Take 1 tablet (25 mg total) by mouth daily. 30 tablet 2   Simethicone (GAS-X PO) Take 1 tablet by mouth as needed.     spironolactone (ALDACTONE) 25 MG tablet TAKE 1/2 TABLET(12.5 MG) BY MOUTH DAILY 45 tablet 3   tadalafil, PAH, (ADCIRCA) 20 MG tablet Take 2 tablets (40 mg total) by mouth daily. 180 tablet 3   torsemide (DEMADEX) 20 MG tablet Take 2 tablets (40 mg total) by mouth 2 (two) times daily. 60 tablet 6   No current facility-administered medications for this encounter.   Allergies  Allergen Reactions  Adhesive [Tape] Hives    Tolerates paper tape   Succinylcholine Other (See Comments)    Have trouble waking up   Tizanidine Hives   Social History   Socioeconomic History   Marital status: Single    Spouse name: Not on file   Number of children: Not on file   Years of education: Not on file   Highest education level: Not on file  Occupational History   Not on file  Tobacco Use   Smoking status: Former   Smokeless tobacco: Never   Tobacco comments:    only smoked for fun in highschool not even one a day  Vaping Use   Vaping Use: Never used  Substance and Sexual Activity   Alcohol use: Yes    Comment: occ   Drug use: Not Currently   Sexual activity: Yes    Partners: Male    Comment: 1st intercourse- 56, partners- 4, current partner- 10 yrs   Other Topics Concern   Not on file  Social History Narrative   Not on file   Social Determinants of Health   Financial Resource Strain: Not on file  Food Insecurity: Not on file  Transportation Needs: Not on file  Physical Activity: Not on file  Stress: Not on file  Social Connections: Not on file  Intimate Partner Violence: Not on file   Family History  Problem Relation Age of Onset   COPD Father    Cancer Mother        gallbladder and  bladder    Hypertension Brother    Breast cancer Other    Diabetes Son    Breast cancer Cousin    Breast cancer Cousin    Pulmonary Hypertension Neg Hx    Wt Readings from Last 3 Encounters:  11/25/21 116.8 kg (257 lb 9.6 oz)  11/07/21 115.7 kg (255 lb)  09/30/21 114.9 kg (253 lb 3.2 oz)   PHYSICAL EXAM: BP 132/80    Pulse 86    Wt 116.8 kg (257 lb 9.6 oz)    LMP  (LMP Unknown) Comment: pmb last 2-3 months   SpO2 90%    BMI 48.67 kg/m  General:  NAD. No resp difficulty, walked into clinic with walker on oxygen HEENT: Normal Neck: Supple. No JVD. Carotids 2+ bilat; no bruits. No lymphadenopathy or thryomegaly appreciated. Cor: PMI nondisplaced. Regular rate & rhythm. No rubs, gallops or murmurs. Lungs: Clear Abdomen: Obese, nontender, nondistended. No hepatosplenomegaly. No bruits or masses. Good bowel sounds. Extremities: No cyanosis, clubbing, rash, edema Neuro: Alert & oriented x 3, cranial nerves grossly intact. Moves all 4 extremities w/o difficulty. Affect pleasant.  ASSESSMENT & PLAN: 1. Chronic diastolic CHF with significant RV dysfunction: Echo in 2/21 with LVEF 55-60%, moderate RV dilation/moderate RV dysfunction.  [place] 2/18 showed elevated left and right heart filling pressures with severe pulmonary hypertension.  Suspect mixed pulmonary venous/pulmonary arterial hypertension in setting of significant LV diastolic dysfunction (PVR 3.87 WU).  Repeat RHC in 9/21 with moderate-severe PAH, normal filling pressures, and PVR 5.6 WU.  No evidence for left=>right shunting.  Echo in 5/22 showed EF 55% with mildly enlarged RV with mild RV dysfunction, mildly D-shaped septum, PASP 48 with normal IVC.  NYHA class III symptoms today, similar to prior.  Weight is up 4 lbs but she does not look markedly volume overloaded.   - Continue torsemide 20 qam/40 qpm and check BMET/BNP today.    - Continue Farxiga 10 mg daily. No GU symptoms. - Continue  spironolactone 12.5 mg daily. 2. Pulmonary  hypertension: Suspect that this is a mixed picture with group 2 and group 3 PH (related to diastolic LV dysfunction and OHS/OSA).  However, concerned for component of group 1 PAH as well.  V/Q scan not suggestive of chronic PEs and high resolution CT chest in 2/21 was not suggestive of ILD. HIV and rheumatologic serologies negative.  RHC showed severe mixed pulmonary venous/pulmonary arterial hypertension, repeat RHC after diuresis in 9/21 showed PAH. PFTs completed in 2/21 showed severe restriction, severely decreased DLCO (?restriction due to body habitus).  Echo in 5/22 showed mild improvement in RV and improvement in estimated PA systolic pressure.  She is on Opsumit, tadalafil and Uptravi 800 mcg bid.   - Continue to maintain oxygen saturation with home O2 and CPAP. Weight loss also would be helpful.  She is planning on a breast reduction surgery which may decrease lung restriction.  - Continue Opsumit 10 mg daily. - Continue tadalafil 40 mg daily.  - She is tolerating Uptravi 800 mcg bid now, she has not tolerate uptitration.  - Check BNP today 3. HTN: BP controlled on current regimen.  4. CKD stage 3. - BMET today.  - Followup with nephrology.  5. OHS/OSA: Compliant w/ CPAP and home oxygen.  6. 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  As she is at baseline sedentary and has had recurrent DVTs, would continue Eliquis long-term.   7. Restrictive lung disease: Large breasts play a role, she is planned for breast reduction surgery.  I think that this would be a reasonable thing for her.  I think she could tolerate this surgery from a cardiac standpoint, but should also be cleared by pulmonary given OHS/OSA.  8. Fractured tooth: Needs dental follow up. I advised her TO disclose this today during her pre-op eval with anesthesia.  Followup in 2-3 months with Dr. Aundra Dubin  Signed, Rafael Bihari, FNP  11/25/2021  [facility] [address] [facility]  [place] [address] [phone] (office) [phone] (fax)

## 2021-11-25 NOTE — Patient Instructions (Signed)
Medication Changes:  No changes  Lab Work:  Labs done today, your results will be available in MyChart, we will contact you for abnormal readings.   Testing/Procedures:  none  Referrals:  none  Special Instructions // Education:  none  Follow-Up in: 3 months with Dr. Aundra Dubin  At the [facility], you and your health needs are our priority. We have a designated team specialized in the treatment of Heart Failure. This Care Team includes your primary Heart Failure Specialized Cardiologist (physician), Advanced Practice Providers (APPs- Physician Assistants and Nurse Practitioners), and Pharmacist who all work together to provide you with the care you need, when you need it.   You may see any of the following providers on your designated Care Team at your next follow up:  Dr Glori Bickers Dr Haynes Kerns, NP Lyda Jester, [place] [facility] [place], [place] Audry Riles, PharmD   Please be sure to bring in all your medications bottles to every appointment.   Need to Contact us:  If you have any questions or concerns before your next appointment please send [place] a message through [place] or call our office at [phone].    TO LEAVE A MESSAGE FOR THE NURSE SELECT OPTION 2, PLEASE LEAVE A MESSAGE INCLUDING: YOUR NAME DATE OF BIRTH CALL BACK NUMBER REASON FOR CALL**this is important as we prioritize the call backs  YOU WILL RECEIVE A CALL BACK THE SAME DAY AS LONG AS YOU CALL BEFORE 4:00 PM

## 2021-11-25 NOTE — Progress Notes (Signed)
CC:  Breast hypertrophy and back pain   Dawn Fowler is an 56 y.o. female.  HPI: Mammary Hyperplasia: The patient is a 56 y.o. female with a history of mammary hyperplasia for several years.  She has extremely large breasts causing symptoms that include the following: Back pain in the upper and lower back, including neck pain. She pulls or pins her bra straps to provide better lift and relief of the pressure and pain. She notices relief by holding her breast up manually.  Her shoulder straps cause grooves and pain and pressure that requires padding for relief. Pain medication is sometimes required with motrin and tylenol.  Activities that are hindered by enlarged breasts include: exercise and running.  In addition she notes that she is on home oxygen and feels that her large breasts restricts her breathing.  She has a diagnosis of pulmonary hypertension and is on 4 to 8 L of home oxygen.  She has discussed breast reduction with her pulmonologist Dr. Loanne Drilling who she states is supportive of her having breast reduction surgery.   Dr. Aundra Dubin  who is her cardiologist.  Dr. Marigene Ehlers also manages her Eliquis has stated 48 hours off Eliquis is acceptable.  She has tried supportive clothing as well as fitted bras without improvement.  She denies any tobacco use.   Her breasts are extremely large and fairly symmetric.  She has hyperpigmentation of the inframammary area on both sides.  Preoperative bra size = DDD cup.   Mammogram history: Normal mammogram June 2022.   Tobacco use:  none.   The patient expresses the desire to pursue surgical intervention.         Allergies  Allergen Reactions   Adhesive [Tape] Hives      Tolerates paper tape   Succinylcholine Other (See Comments)      Have trouble waking up   Tizanidine Hives           Outpatient Encounter Medications as of 09/02/2021  Medication Sig Note   acetaminophen (TYLENOL) 500 MG tablet Take 1,000 mg by mouth every 6 (six) hours as needed for  moderate pain or headache.     allopurinol (ZYLOPRIM) 100 MG tablet Take 100 mg by mouth daily.     apixaban (ELIQUIS) 5 MG TABS tablet Take 1 tablet (5 mg total) by mouth 2 (two) times daily.     Biotin w/ Vitamins C & E (HAIR SKIN & NAILS GUMMIES PO) Take 2 each by mouth daily.     Calcium Carbonate Antacid (TUMS E-X PO) Take 1 tablet by mouth as needed.     Cholecalciferol (VITAMIN D3) 125 MCG (5000 UT) CAPS Take 1 capsule by mouth daily.     colchicine 0.6 MG tablet Take 0.6 mg by mouth daily as needed (gout flare).     dapagliflozin propanediol (FARXIGA) 10 MG TABS tablet Take 1 tablet (10 mg total) by mouth daily before breakfast.     diclofenac Sodium (VOLTAREN) 1 % GEL Apply 2 g topically as needed.     diphenhydramine-acetaminophen (TYLENOL PM) 25-500 MG TABS tablet Take 2 tablets by mouth at bedtime as needed (sleep).     fluconazole (DIFLUCAN) 150 MG tablet Take 1 tablet (150 mg total) by mouth as needed. Take as needed for yeast infection symptoms     ipratropium-albuterol (DUONEB) 0.5-2.5 (3) MG/3ML SOLN Take 3 mLs by nebulization.     macitentan (OPSUMIT) 10 MG tablet Take 1 tablet (10 mg total) by mouth daily.  OXYGEN Inhale 4-8 L into the lungs continuous.     pantoprazole (PROTONIX) 40 MG tablet Take 40 mg by mouth daily.     potassium chloride SA (KLOR-CON) 20 MEQ tablet Take 20 mEq by mouth daily.  11/11/2020: .   Selexipag (UPTRAVI) 800 MCG TABS Take 1 tablet (800 mcg total) by mouth 2 (two) times daily.     sertraline (ZOLOFT) 25 MG tablet Take 1 tablet (25 mg total) by mouth daily.     Simethicone (GAS-X PO) Take 1 tablet by mouth as needed.     spironolactone (ALDACTONE) 25 MG tablet TAKE 1/2 TABLET(12.5 MG) BY MOUTH DAILY     tadalafil, PAH, (ADCIRCA) 20 MG tablet Take 2 tablets (40 mg total) by mouth daily.     torsemide (DEMADEX) 20 MG tablet Take 1 tablet (20 mg total) by mouth every morning AND 2 tablets (40 mg total) every evening.      No facility-administered  encounter medications on file as of 09/02/2021.          Past Medical History:  Diagnosis Date   Acute respiratory failure due to COVID-19 [facility]) 10/30/2019   Arthritis     CHF (congestive heart failure) (HCC)     CKD (chronic kidney disease), stage III (HCC)     Complication of anesthesia      hard to wake up after general anesthesia - had trouble waking up after succinylcholine (consider possibility of pseudocholinesterase deficiency)   COVID-19 virus detected 11/20/2019    Tested positive for Covid in December/2020 Required hospitalization in December/2020 through January/2021 Received Redemsivir   Dyspnea     Family history of adverse reaction to anesthesia      hard to wake up after general anesthesia   GERD (gastroesophageal reflux disease)     HLD (hyperlipidemia)     HTN (hypertension)     Pre-diabetes      no meds   Pulmonary HTN ([place])     Sleep apnea      on CPAP           Past Surgical History:  Procedure Laterality Date   CARPAL TUNNEL RELEASE Bilateral     CESAREAN SECTION       COLONOSCOPY WITH PROPOFOL N/A 03/12/2020    Procedure: COLONOSCOPY WITH PROPOFOL;  Surgeon: Carol Ada, MD;  Location: [facility];  Service: Endoscopy;  Laterality: N/A;   DILATATION & CURETTAGE/HYSTEROSCOPY WITH MYOSURE N/A 04/08/2020    Procedure: [place];  Surgeon: Princess Bruins, MD;  Location: [facility];  Service: Gynecology;  Laterality: N/A;  request 8:30am OR start-requested through [place] for [place] requests one hour   HEMOSTASIS CLIP PLACEMENT   03/12/2020    Procedure: HEMOSTASIS CLIP PLACEMENT;  Surgeon: Carol Ada, MD;  Location: [facility];  Service: Endoscopy;;   LEG SURGERY Right 1988    metal rod   lumbar surgery       POLYPECTOMY   03/12/2020    Procedure: POLYPECTOMY;  Surgeon: Carol Ada, MD;  Location: [facility];  Service: Endoscopy;;   RIGHT HEART CATH N/A 12/18/2019    Procedure: RIGHT HEART CATH;   Surgeon: Larey Dresser, MD;  Location: [facility];  Service: Cardiovascular;  Laterality: N/A;   RIGHT HEART CATH N/A 07/12/2020    Procedure: RIGHT HEART CATH;  Surgeon: Larey Dresser, MD;  Location: [facility];  Service: Cardiovascular;  Laterality: N/A;           Family  History  Problem Relation Age of Onset   COPD Father     Cancer Mother          gallbladder and bladder    Hypertension Brother     Breast cancer Other     Diabetes Son     Breast cancer Cousin     Breast cancer Cousin     Pulmonary Hypertension Neg Hx        Social history: No tobacco use, no illicit drug use, presents with caretaker.  She works from home.     Review of Systems General: Denies fevers, chills, weight loss CV: Denies chest pain, shortness of breath, palpitations     Physical Exam Vitals with BMI 09/02/2021 08/02/2021 06/27/2021  Height 5\' 1"  5\' 1"  -  Weight 258 lbs 10 oz 254 lbs 3 oz 248 lbs 13 oz  BMI 63.81 77.11 -  Systolic [phone]  Diastolic 86 64 90  Pulse - 99 66    General:  No acute distress,  Alert and oriented, Non-Toxic, Normal speech and affect Breast: Patient has very large breasts.  She has severe ptosis.  No breast masses were easily palpable on exam.  The sternal to nipple distance on the right is 45 cm and the left is 45 cm.  The IMF distance is 19 cm bilaterally.   Assessment/Plan Risks and benefits discussed and the patient wishes to proceed despite discussing that this could have a high risk of all complications and even death due to her comorbidities.  She wants to proceed since she thinks this may help her breathing and mobility and ultimately improve her morbidity.

## 2021-11-27 ENCOUNTER — Encounter (HOSPITAL_COMMUNITY): Payer: Self-pay | Admitting: Cardiology

## 2021-11-28 ENCOUNTER — Encounter (HOSPITAL_COMMUNITY): Payer: Self-pay | Admitting: Cardiology

## 2021-11-29 ENCOUNTER — Telehealth (HOSPITAL_COMMUNITY): Payer: Self-pay | Admitting: Cardiology

## 2021-11-29 DIAGNOSIS — I5032 Chronic diastolic (congestive) heart failure: Secondary | ICD-10-CM

## 2021-11-29 MED ORDER — TORSEMIDE 20 MG PO TABS: 40.0000 mg | ORAL_TABLET | Freq: Two times a day (BID) | ORAL | 6 refills | Status: DC

## 2021-11-29 NOTE — Telephone Encounter (Signed)
Patient called.  Patient aware.  

## 2021-11-29 NOTE — Telephone Encounter (Signed)
-----   Message from Rafael Bihari, [place] sent at 11/25/2021  6:52 PM EST ----- BNP elevated. Please increase Lasix to 40 mg bid. Repeat BMET in 2 weeks

## 2021-11-30 ENCOUNTER — Telehealth (HOSPITAL_COMMUNITY): Payer: Self-pay | Admitting: Licensed Clinical Social Worker

## 2021-11-30 NOTE — Telephone Encounter (Signed)
CSW received request to mail patient an Advanced Directive form. CSW placed in the mail and available as needed. Dawn Fowler, Dawn Fowler, Dawn Fowler

## 2021-12-02 ENCOUNTER — Ambulatory Visit: Payer: Medicare Other | Admitting: Internal Medicine

## 2021-12-16 ENCOUNTER — Other Ambulatory Visit (HOSPITAL_COMMUNITY): Payer: Medicare Other

## 2021-12-16 ENCOUNTER — Encounter (HOSPITAL_COMMUNITY): Payer: Self-pay | Admitting: Plastic Surgery

## 2021-12-16 ENCOUNTER — Other Ambulatory Visit: Payer: Self-pay

## 2021-12-16 ENCOUNTER — Ambulatory Visit (HOSPITAL_COMMUNITY)
Admission: RE | Admit: 2021-12-16 | Discharge: 2021-12-16 | Disposition: A | Discharge status: Home / Self Care | Payer: Medicare Other | Source: Ambulatory Visit | Attending: Internal Medicine | Admitting: Internal Medicine

## 2021-12-16 DIAGNOSIS — I5032 Chronic diastolic (congestive) heart failure: Secondary | ICD-10-CM | POA: Insufficient documentation

## 2021-12-16 LAB — BASIC METABOLIC PANEL
Anion gap: 11 (ref 5–15)
BUN: 35 mg/dL — ABNORMAL HIGH (ref 6–20)
CO2: 21 mmol/L — ABNORMAL LOW (ref 22–32)
Calcium: 9.7 mg/dL (ref 8.9–10.3)
Chloride: 108 mmol/L (ref 98–111)
Creatinine, Ser: 2.02 mg/dL — ABNORMAL HIGH (ref 0.44–1.00)
GFR, Estimated: 29 mL/min — ABNORMAL LOW (ref >60)
Glucose, Bld: 99 mg/dL (ref 70–99)
Potassium: 4.1 mmol/L (ref 3.5–5.1)
Sodium: 140 mmol/L (ref 135–145)

## 2021-12-16 NOTE — Pre-Procedure Instructions (Addendum)
Surgical Instructions    Your procedure is scheduled on Tuesday, December 20, 2021 at 12:15 PM.  Report to [facility] Main Entrance "A" at 9:45 A.M., then check in with the Admitting office.  Call this number if you have problems the morning of surgery:  [phone]   If you have any questions prior to your surgery date call [phone]: Open Monday-Friday 8am-4pm    Remember:  Do not eat or drink after midnight the night before your surgery    Take these medicines the morning of surgery with A SIP OF WATER:  allopurinol (ZYLOPRIM) macitentan (OPSUMIT)  Selexipag (UPTRAVI)  IF NEEDED: acetaminophen (TYLENOL)  Colchicine famotidine (PEPCID)  Follow your surgeon's instructions on when to stop apixaban (ELIQUIS).  If no instructions were given by your surgeon then you will need to call the office to get those instructions.     As of today, STOP taking any Aspirin (unless otherwise instructed by your surgeon) Aleve, Naproxen, Ibuprofen, Motrin, Advil, Goody's, BC's, diclofenac Sodium (VOLTAREN) 1 % GEL, all herbal medications, fish oil, and all vitamins.  Do not take dapagliflozin propanediol (FARXIGA) the day before surgery.                     Do NOT Smoke (Tobacco/Vaping) for 24 hours prior to your procedure.  If you use a CPAP at night, you may bring your mask/headgear for your overnight stay.   Contacts, glasses, piercing's, hearing aid's, dentures or partials may not be worn into surgery, please bring cases for these belongings.    For patients admitted to the hospital, discharge time will be determined by your treatment team.   Patients discharged the day of surgery will not be allowed to drive home, and someone needs to stay with them for 24 hours.  NO VISITORS WILL BE ALLOWED IN PRE-OP WHERE PATIENTS ARE PREPPED FOR SURGERY.  ONLY 1 SUPPORT PERSON MAY BE PRESENT IN THE WAITING ROOM WHILE YOU ARE IN SURGERY.  IF YOU ARE TO BE ADMITTED, ONCE YOU ARE IN YOUR ROOM YOU  WILL BE ALLOWED TWO (2) VISITORS. (1) VISITOR MAY STAY OVERNIGHT BUT MUST ARRIVE TO THE ROOM BY 8pm.  Minor children may have two parents present. Special consideration for safety and communication needs will be reviewed on a case by case basis.  Day of Surgery: Shower with mild soap. Do not wear jewelry, make up, nail polish, gel polish, artificial nails, or any other type of covering on natural nails including finger and toenails. If patients have artificial nails, gel coating, etc. that need to be removed by a nail salon please have this removed prior to surgery. Surgery may need to be canceled/delayed if the surgeon/anesthesiologist feels like the patient is unable to be adequately monitored. Do not wear lotions, powders, perfumes, or deodorant. Do not shave 48 hours prior to surgery. Do not bring valuables to the hospital. [facility] is not responsible for any belongings or valuables. Wear Clean/Comfortable clothing the morning of surgery Remember to brush your teeth WITH YOUR REGULAR TOOTHPASTE.   Please read over the following fact sheets that you were given.  Patient states she had a positive home COVID test on 11/06/21. She is on 4-8L of continuous O2. Patient also has sleep apnea and uses a CPAP at night.  Patient states she is not diabetic and does not recall why she is taking [place].

## 2021-12-19 ENCOUNTER — Encounter (HOSPITAL_COMMUNITY): Payer: Self-pay | Admitting: Plastic Surgery

## 2021-12-19 NOTE — Progress Notes (Signed)
Anesthesia Chart Review:  Case: [id number] Date/Time: 12/20/21 1200   Procedure: bilateral breast reduction with likely free nipple graft (Bilateral: Breast) - 4 hours   Anesthesia type: General   Pre-op diagnosis: Breast Hypertrophy   Location: [facility] OR ROOM 05 / [facility]   Surgeons: Lennice Sites, MD       DISCUSSION: Patient is a 56 year old female scheduled for the above procedure.    History includes former smoker (in highschool only "for fun"), pulmonary hypertension, HTN, HLD, chronic diastolic CHF, dyspnea, OSA/OHS (uses CPAP & O2), home oxygen (4L at rest, up to 8L with activity), pre-diabetes, CKD (stage II, SCr ranged ~ 1.4-2.3 since 2016), DVT (remote DVT after train ride; RLE DVT 06/09/20 after long car trip, on long term Eliquis), COVID-19 (hospitalization 10/28/20-11/03/19; + home test ~ 12/10/21). Succinylcholine is listed as an allergy ("have trouble waking up")--she reported that she does have multiple family member that have confirmation testing of pseudocholinesterase deficiency; patient believes she has as well but was unsure if she had confirmation testing.    Her primary pulmonologist is Margaretha Seeds, MD. Last visit 08/02/21. She noted patient had been referred to Plastics to evaluate for breast reduction surgery as she felt her large breasts were significantly effecting her breathing. She was restarted on Breo and was compliant with oxygen (up to 8L with exertion) and CPAP. 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 She wrote, "For long term management of her respiratory symptoms, reasonable to pursue weight loss/breast reduction surgery given her co-comitant restrictive defect related to chest wall compliance. She has been referred to Plastics and  awaiting evaluation. Counseled to continue bronchodilators and diuretics per Cardiology." Of note, she had an acute pulmonology visit since then with Lenice Llamas, MD on 11/07/21 for cough and congestion. Symptoms started 10/29/21. COVID and influenza tests were negative (although patient reported to PAT RN that her 11/06/21 home COVID test was positive). CXR did not show PNA. Did not require hospitalization for viral illness, as she was at baseline oxygen support. Continue albuterol nebs as needed, water based gel in nares for moisture. Suggested pre-operative visit with Dr. Loanne Drilling, although she has not had a new pulmonary visit since then and Dr. Loanne Drilling had previously indicated it was "reasonable to pursue" the planned surgery.   She did have cardiology follow-up with preoperative evaluation (see below).    - Last evaluated at [facility] on 11/25/21 by Allena Katz, [place] for follow-up chronic diastolic CHF with RV dysfunction and pulmonary hypertension. He pulmonary hypertension if felt liked mixed picture with group 2 and group 3 PH related to diastolic LV dysfunction and OHS/OSA; however, concern about component of group 1 PAH as well. Work-up included VQ scan which did not suggest chronic PE; high resolution CT chest did not suggest ILD in 12/2019; HIV and rheumatologic serologies negative; 12/2019 RHC showed "showed severe mixed pulmonary venous/pulmonary arterial hypertension, repeat RHC after diuresis in 9/21 showed PAH. PFTs completed in 2/21 showed severe restriction, severely decreased DLCO (?restriction due to body habitus).  Echo in 5/22 showed mild improvement in RV and improvement in estimated PA systolic pressure." She is managed on Opsumit, tadalafil, and Uptravi. She is on on home O2 and CPAP. She is also on Eliquis for recurrent DVT. At  that visit, her weight up 4 lbs but did not look markedly volume overloaded; however, Lasix increased to 40 mg BID due to elevated BNP. In regards  to surgery, she added: "7. Restrictive lung disease: Large breasts play a role, she is planned for breast reduction surgery.  I think that this would be a reasonable thing for her.  I think she could tolerate this surgery from a cardiac standpoint, but should also be cleared by pulmonary given OHS/OSA.  8. Fractured tooth: Needs dental follow up. I advised her TO disclose this today during her pre-op eval with anesthesia." Follow-up in 2-3 months planned with Dr. Aundra Dubin.   Dr. Erin Hearing discussed that "she is at high risk for complications or even death with the surgery compared to other patients based on her cardiac and pulmonary status but she is very interested in proceeding because she feels this will improve her breathing." He wrote an order for pre-operative anesthesia consult; however, she was a same day work-up, PAT appointments were full when she eventually called to schedule on 12/16/21. I  did call and speak with Ms. Scearce. She reported that she feels at her baseline from a cardiopulmonary standpoint. She says that a few days after she saw Dr. Shearon Stalls, she had a positive home COVID-19 test, so positive test was likely on 12/10/21 and not 12/07/21 as she initially reported. 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 No noticeable conversational dyspnea. No coughing or hoarseness noted. She uses 4L O2 at rest and up to 8 L O2 with activity. She does have chronic DOE, sometimes even with ambulating short distances. She uses albuterol as needed and denied need for regular use. She has not required higher than usual oxygen. She denied any new or progressive respiratory symptoms or edema. She says she is compliant with her medication regimen. She does not recall needing to reschedule another pulmonology visit prior to surgery. She did report instructions to hold Eliquis for a "couple" days prior  to surgery. She believes her last dose was on 12/16/21.   Above reviewed with anesthesiologist Renold Don, MD. Since Dr. Loanne Drilling had not been notified that surgery was now scheduled, he wanted to confirm preoperative input. Dr. Loanne Drilling is actually one of the pulmonologists covering [facility] this week. I called and spoke with her. I updated her on patient's acute visit with Dr. Shearon Stalls in January and also last cardiology visit and preoperative input. I notified that patient reported that she feels back at her baseline. Dr. Loanne Drilling, classified Pryor Ochoa as "High Risk" from a pulmonary standpoint for planned surgery, but if no acute asthma/respiratory exacerbation then felt it was reasonable to proceed. She will need non-invasive ventilation (uses CPAP at home) post-operative and would be at increased risk for post-operative ventilator. Consider post-operative Hospitalist or Pulmonary consults depending on post-operative findings and anticipated level of care after surgery. (Patient reported overnight stay is planned.) If pulmonology is called, then she requested that provider be notified that Dr. Loanne Drilling is her primary pulmonologist.    Anesthesia team to evaluate on the day of surgery.    VS: Ht 5\' 1"  (1.549 m)    Wt 112.9 kg    LMP  (LMP Unknown) Comment: pmb last 2-3 months   BMI 47.05 kg/m    PROVIDERS: Hayden Rasmussen, MD is PCP  [name], MD is HF cardiologist Margaretha Seeds,  MD is pulmonologist.  Donato Heinz, MD is nephrologist. 12/20/20 office visit scanned under Media tab.    LABS: Additional labs on the day of surgery as indicated. Most recent labs results include: Lab Results  Component Value Date   WBC 6.2 06/30/2020   HGB 13.6 07/12/2020   HCT 40.0 07/12/2020   PLT 245 06/30/2020   GLUCOSE 99 12/16/2021   NA 140 12/16/2021   K 4.1 12/16/2021   CL 108 12/16/2021   CREATININE 2.02 (H) 12/16/2021   BUN 35 (H) 12/16/2021   CO2 21 (L) 12/16/2021  BMET was done  per [facility] Clinic, and Creatinine felt stable.    OTHER: Sleep Study (NPSG) 07/01/20: IMPRESSIONS - No significant obstructive sleep apnea occurred during this study (AHI = 3.2/h). - No significant central sleep apnea occurred during this study (CAI = 0.0/h). - The patient arrived on O2 6L. Study was begun on 4L and titrated up to 5L at 1:25 AM. - Oxygen desaturation was noted during this study (Min O2 = 85.0%). Mean sat 89.7%. Time with O2 sat 88% or less was 114.5 minutes. - The patient snored with moderate snoring volume. - EKG findings include PVCs. - Clinically significant periodic limb movements did not occur during sleep. No significant associated arousals. RECOMMENDATIONS - Supplemental O2 at 6L is appropriate during sleep for dx Nocturnal Hypoxemia... - Reportedly previous sleep study at [facility] in [place], [place] showed severe OSA. Per 09/20/20 [facility] follow-up, "Since our last visit, she had a sleep study completed however did not sleep without interruption and felt the experience was overall poor.  She did not feel the sleep study was representative of what she did at home.  Otherwise she reports her dyspnea is at baseline and that she has been compliant with therapies including her home oxygen 4 L at rest and 8 L on exertion.  She continues to wear her CPAP which she finds comfortable and improves her dyspnea and quality of sleep.  When she does not wear her CPAP she notices a difference in her breathing."   PFTs 12/21/19: FVC 1.25 (42%), post 1.43 (48%) FEV1 1.09 (46%), post 1.27 (54%) DLCO unc 7.00 (32%), cor 6.84 (31%) Interpretation: No evidence of obstructive defect however significant response to bronchodilators suggestive of asthma. Co-comitant restrictive lung defect with reduced gas exchange also present.    IMAGES: CXR 11/07/21: FINDINGS: - Cardiomediastinal silhouette unchanged in size and contour. Fullness in the central vessels again demonstrated. No  interlobular septal thickening. - Coarsened interstitial markings with no new confluent airspace disease. - No pneumothorax or pleural effusion. - No acute displaced fracture. Degenerative changes of the spine. IMPRESSION: Similar appearance of the chest x-ray, with no evidence of acute cardiopulmonary disease   CT Chest High resolution 12/21/19: IMPRESSION: 1. No findings to suggest interstitial lung disease. 2. There is a spectrum of findings concerning for small vessel disease in the lungs, as detailed above. 3. Dilatation of the pulmonic trunk (4.2 cm in diameter), concerning for pulmonary arterial hypertension start that compatible with reported clinical history of pulmonary arterial hypertension. 4. Hepatic steatosis. Aortic Atherosclerosis (ICD10-I70.0) and Emphysema (ICD10-J43.9).     EKG: 06/13/21: Normal sinus rhythm     CV: Echo 03/08/21: IMPRESSIONS   1. Left ventricular ejection fraction, by estimation, is 55%. The left  ventricle has normal function. The left ventricle has no regional wall  motion abnormalities. Left ventricular diastolic parameters are consistent  with Grade I diastolic dysfunction  (impaired relaxation).   2.  Mildly D-shaped interventricular septum suggestive of a degree of RV  pressure/volume overload. Right ventricular systolic function is mildly  reduced. The right ventricular size is mildly enlarged. There is  moderately elevated pulmonary artery  systolic pressure.   3. Left atrial size was mildly dilated.   4. Right atrial size was mildly dilated.   5. The mitral valve is abnormal. Mild mitral valve regurgitation. No  evidence of mitral stenosis.   6. The aortic valve is tricuspid. Aortic valve regurgitation is not  visualized. No aortic stenosis is present.   7. The inferior vena cava is normal in size with greater than 50%  respiratory variability, suggesting right atrial pressure of 3 mmHg.  - Comparison echo 12/18/19: LVEF 55-60%,  grade 1 DD, RVSF mildly reduced, RV side moderately enlarged, severely elevated PASP, estimated RVSP 76.9 mmHg, D-shaped IV septum suggesting RV pressure/volume overload, trial MR   RHC 07/12/20: Hemodynamics (mmHg) RA mean 9 RV 67/11 PA 68/23, mean 40 PCWP mean 11  Oxygen saturations: SVC 65% IVC 66% RA 70% RV 66% PA 65% AO 99%  Cardiac Output (Fick) 5.22  Cardiac Index (Fick) 2.46  PVR 5.6 WU  CONCLUSION: 1. Moderate to severe pulmonary arterial hypertension, PVR 5.6 WU.  2. Near-normal filling pressures.  3. No step up in oxygen saturation to suggest left to right shunting.    Will work on starting [facility] 12/18/19 elevated right and left hear filling pressures, severe pulmonary hypertension likely mixed pulmonary venous and pulmonary arterial hypertension, preserved CO, needs ongoing diuresis.   RLE Venous US 06/09/20: Summary:  RIGHT:  - Findings consistent with acute deep vein thrombosis involving the right  popliteal vein, and right posterior tibial veins.  - No cystic structure found in the popliteal fossa.     LEFT:  - No evidence of common femoral vein obstruction.      Past Medical History:  Diagnosis Date   Acute respiratory failure due to COVID-19 [facility]) 10/30/2019   Anxiety    Arthritis    CHF (congestive heart failure) (HCC)    CKD (chronic kidney disease), stage III (HCC)    Complication of anesthesia    hard to wake up after general anesthesia - had trouble waking up after succinylcholine (consider possibility of pseudocholinesterase deficiency)   COVID-19 virus detected 11/20/2019   Tested positive for Covid in December/2020 Required hospitalization in December/2020 through January/2021 Received Redemsivir   Depression    Dyspnea    Family history of adverse reaction to anesthesia    hard to wake up after general anesthesia   GERD (gastroesophageal reflux disease)    HLD (hyperlipidemia)    HTN (hypertension)    Pre-diabetes     no meds   Pulmonary HTN ([facility])    Sleep apnea    on CPAP    Past Surgical History:  Procedure Laterality Date   CARPAL TUNNEL RELEASE Bilateral    CESAREAN SECTION     COLONOSCOPY WITH PROPOFOL N/A 03/12/2020   Procedure: COLONOSCOPY WITH PROPOFOL;  Surgeon: Carol Ada, MD;  Location: [facility];  Service: Endoscopy;  Laterality: N/A;   DILATATION & CURETTAGE/HYSTEROSCOPY WITH MYOSURE N/A 04/08/2020   Procedure: [place];  Surgeon: Princess Bruins, MD;  Location: [facility];  Service: Gynecology;  Laterality: N/A;  request 8:30am OR start-requested through [facility] for [place] requests one hour   HEMOSTASIS CLIP PLACEMENT  03/12/2020   Procedure: HEMOSTASIS CLIP PLACEMENT;  Surgeon: Carol Ada, MD;  Location:  [facility];  Service: Endoscopy;;   LEG SURGERY Right 1988   metal rod   lumbar surgery     POLYPECTOMY  03/12/2020   Procedure: POLYPECTOMY;  Surgeon: Carol Ada, MD;  Location: [facility];  Service: Endoscopy;;   RIGHT HEART CATH N/A 12/18/2019   Procedure: RIGHT HEART CATH;  Surgeon: Larey Dresser, MD;  Location: [facility];  Service: Cardiovascular;  Laterality: N/A;   RIGHT HEART CATH N/A 07/12/2020   Procedure: RIGHT HEART CATH;  Surgeon: Larey Dresser, MD;  Location: [facility];  Service: Cardiovascular;  Laterality: N/A;    MEDICATIONS: No current facility-administered medications for this encounter.    acetaminophen (TYLENOL) 500 MG tablet   allopurinol (ZYLOPRIM) 100 MG tablet   apixaban (ELIQUIS) 5 MG TABS tablet   Calcium Carbonate Antacid (TUMS E-X PO)   Cholecalciferol (VITAMIN D3) 125 MCG (5000 UT) CAPS   colchicine 0.6 MG tablet   dapagliflozin propanediol (FARXIGA) 10 MG TABS tablet   diclofenac Sodium (VOLTAREN) 1 % GEL   diphenhydramine-acetaminophen (TYLENOL PM) 25-500 MG TABS tablet   famotidine (PEPCID) 20 MG tablet   ipratropium-albuterol (DUONEB) 0.5-2.5 (3) MG/3ML SOLN    macitentan (OPSUMIT) 10 MG tablet   OXYGEN   potassium chloride SA (KLOR-CON) 20 MEQ tablet   Selexipag (UPTRAVI) 800 MCG TABS   sertraline (ZOLOFT) 25 MG tablet   simethicone (MYLICON) 80 MG chewable tablet   spironolactone (ALDACTONE) 25 MG tablet   tadalafil, PAH, (ADCIRCA) 20 MG tablet   torsemide (DEMADEX) 20 MG tablet   Biotin w/ Vitamins C & E (HAIR SKIN & NAILS GUMMIES PO)    Myra Gianotti, PA-C Surgical Short Stay/Anesthesiology [facility] Phone [phone] [facility] Phone [phone] 12/19/2021 3:54 PM

## 2021-12-19 NOTE — Anesthesia Preprocedure Evaluation (Addendum)
Anesthesia Evaluation  Patient identified by MRN, date of birth, ID band Patient awake    Reviewed: Allergy & Precautions, NPO status , Patient's Chart, lab work & pertinent test results  History of Anesthesia Complications (+) PSEUDOCHOLINESTERASE DEFICIENCY and history of anesthetic complications  Airway Mallampati: II  TM Distance: >3 FB Neck ROM: Full    Dental  (+) Teeth Intact, Dental Advisory Given   Pulmonary shortness of breath, with exertion, at rest and Long-Term Oxygen Therapy, sleep apnea and Continuous Positive Airway Pressure Ventilation , former smoker,    Pulmonary exam normal        Cardiovascular hypertension, pulmonary hypertension+CHF and + DVT  Normal cardiovascular exam   Echo 03/08/21: 1. Left ventricular ejection fraction, by estimation, is 55%. The left ventricle has normal function. The left ventricle has no regional wall motion abnormalities. Left ventricular diastolic parameters are consistent with Grade I diastolic dysfunction  (impaired relaxation). 2. Mildly D-shaped interventricular septum suggestive of a degree of RV pressure/volume overload. Right ventricular systolic function is mildly reduced. The right ventricular size is mildly enlarged. There is moderately elevated pulmonary artery  systolic pressure. 3. Left atrial size was mildly dilated. 4. Right atrial size was mildly dilated. 5. The mitral valve is abnormal. Mild mitral valve regurgitation. No evidence of mitral stenosis. 6. The aortic valve is tricuspid. Aortic valve regurgitation is not visualized. No aortic stenosis is present. 7. The inferior vena cava is normal in size with greater than 50% respiratory variability, suggesting right atrial pressure of 3 mmHg.   Neuro/Psych negative neurological ROS     GI/Hepatic Neg liver ROS, GERD  ,  Endo/Other  Morbid obesity  Renal/GU CRFRenal disease (CKD3)  negative genitourinary    Musculoskeletal negative musculoskeletal ROS (+)   Abdominal   Peds  Hematology negative hematology ROS (+)   Anesthesia Other Findings   Reproductive/Obstetrics                           Anesthesia Physical Anesthesia Plan  ASA: 4  Anesthesia Plan: General   Post-op Pain Management: Ofirmev IV (intra-op)* and Ketamine IV*   Induction: Intravenous  PONV Risk Score and Plan: 3 and Ondansetron, Dexamethasone, Treatment may vary due to age or medical condition and Midazolam  Airway Management Planned: Oral ETT  Additional Equipment: None  Intra-op Plan:   Post-operative Plan: Extubation in OR  Informed Consent: I have reviewed the patients History and Physical, chart, labs and discussed the procedure including the risks, benefits and alternatives for the proposed anesthesia with the patient or authorized representative who has indicated his/her understanding and acceptance.     Dental advisory given  Plan Discussed with:   Anesthesia Plan Comments: (See PAT note written 12/19/2021 by Myra Gianotti, PA-C.  Pulmonologist is Dr. Loanne Drilling. She is one of the pulmonlogists covering [facility] this week. I called and spoke with her on 12/19/21. She classified Pryor Ochoa as "High Risk" from a pulmonary standpoint for planned surgery, but if no acute asthma/respiratory exacerbation then felt it was reasonable to proceed. Patient recommended non-invasive ventilation (uses CPAP at home) post-operative and would be at increased risk for post-operative ventilator. Consider post-operative Hospitalist or Pulmonary consults depending on post-operative findings and anticipated level of care after surgery. If pulmonology is called, then she requested that provider be notified that Dr. Loanne Drilling is her primary pulmonologist.    HF Cardiologist is Dr. Aundra Dubin. Last evaluated at [facility] on 11/25/21 by [place],  Janett Billow, FNP for follow-up chronic diastolic CHF  with RV dysfunction and pulmonary hypertension. Preoperative assessment included: "7. Restrictive lung disease: Large breasts play a role, she is planned for breast reduction surgery. I think that this would be a reasonable thing for her. I think she could tolerate this surgery from a cardiac standpoint,but should also be cleared by pulmonary given OHS/OSA. 8. Fractured tooth: Needs dental follow up. I advised her TO disclose this today during her pre-op eval with anesthesia."  )       Anesthesia Quick Evaluation

## 2021-12-20 ENCOUNTER — Encounter (HOSPITAL_COMMUNITY): Payer: Self-pay | Admitting: Plastic Surgery

## 2021-12-20 ENCOUNTER — Observation Stay (HOSPITAL_COMMUNITY)
Admission: RE | Admit: 2021-12-20 | Discharge: 2021-12-21 | Disposition: A | Discharge status: Home / Self Care | Payer: Medicare Other | Attending: Plastic Surgery | Admitting: Plastic Surgery

## 2021-12-20 ENCOUNTER — Ambulatory Visit (HOSPITAL_COMMUNITY): Payer: Medicare Other | Admitting: Vascular Surgery

## 2021-12-20 ENCOUNTER — Encounter (HOSPITAL_COMMUNITY)
Admission: RE | Disposition: A | Discharge status: Home / Self Care | Payer: Self-pay | Source: Home / Self Care | Attending: Plastic Surgery

## 2021-12-20 ENCOUNTER — Ambulatory Visit (HOSPITAL_BASED_OUTPATIENT_CLINIC_OR_DEPARTMENT_OTHER): Payer: Medicare Other | Admitting: Vascular Surgery

## 2021-12-20 ENCOUNTER — Other Ambulatory Visit: Payer: Self-pay

## 2021-12-20 DIAGNOSIS — I130 Hypertensive heart and chronic kidney disease with heart failure and stage 1 through stage 4 chronic kidney disease, or unspecified chronic kidney disease: Secondary | ICD-10-CM

## 2021-12-20 DIAGNOSIS — M542 Cervicalgia: Secondary | ICD-10-CM | POA: Insufficient documentation

## 2021-12-20 DIAGNOSIS — M549 Dorsalgia, unspecified: Secondary | ICD-10-CM | POA: Diagnosis not present

## 2021-12-20 DIAGNOSIS — Z79899 Other long term (current) drug therapy: Secondary | ICD-10-CM | POA: Insufficient documentation

## 2021-12-20 DIAGNOSIS — I509 Heart failure, unspecified: Secondary | ICD-10-CM | POA: Diagnosis not present

## 2021-12-20 DIAGNOSIS — Z8616 Personal history of COVID-19: Secondary | ICD-10-CM | POA: Diagnosis not present

## 2021-12-20 DIAGNOSIS — N183 Chronic kidney disease, stage 3 unspecified: Secondary | ICD-10-CM | POA: Diagnosis not present

## 2021-12-20 DIAGNOSIS — N62 Hypertrophy of breast: Secondary | ICD-10-CM

## 2021-12-20 DIAGNOSIS — G8929 Other chronic pain: Secondary | ICD-10-CM

## 2021-12-20 DIAGNOSIS — Z7901 Long term (current) use of anticoagulants: Secondary | ICD-10-CM | POA: Diagnosis not present

## 2021-12-20 DIAGNOSIS — Z9981 Dependence on supplemental oxygen: Secondary | ICD-10-CM | POA: Insufficient documentation

## 2021-12-20 DIAGNOSIS — M25519 Pain in unspecified shoulder: Secondary | ICD-10-CM | POA: Diagnosis not present

## 2021-12-20 DIAGNOSIS — M546 Pain in thoracic spine: Secondary | ICD-10-CM

## 2021-12-20 HISTORY — PX: BREAST REDUCTION SURGERY: SHX8

## 2021-12-20 HISTORY — DX: Anxiety disorder, unspecified: F41.9

## 2021-12-20 HISTORY — DX: Other disorders of plasma-protein metabolism, not elsewhere classified: E88.09

## 2021-12-20 HISTORY — DX: Depression, unspecified: F32.A

## 2021-12-20 LAB — CBC
HCT: 47.5 % — ABNORMAL HIGH (ref 36.0–46.0)
Hemoglobin: 15.1 g/dL — ABNORMAL HIGH (ref 12.0–15.0)
MCH: 29.7 pg (ref 26.0–34.0)
MCHC: 31.8 g/dL (ref 30.0–36.0)
MCV: 93.5 fL (ref 80.0–100.0)
Platelets: 268 10*3/uL (ref 150–400)
RBC: 5.08 MIL/uL (ref 3.87–5.11)
RDW: 15.9 % — ABNORMAL HIGH (ref 11.5–15.5)
WBC: 3.7 10*3/uL — ABNORMAL LOW (ref 4.0–10.5)
nRBC: 0 % (ref 0.0–0.2)

## 2021-12-20 SURGERY — MAMMOPLASTY, REDUCTION
Anesthesia: General | Site: Breast | Laterality: Bilateral

## 2021-12-20 MED ORDER — LIDOCAINE 2% (20 MG/ML) 5 ML SYRINGE
INTRAMUSCULAR | Status: DC | PRN
  Administered 2021-12-20: 30 mg via INTRAVENOUS

## 2021-12-20 MED ORDER — OXYCODONE HCL 5 MG/5ML PO SOLN: 5.0000 mg | Freq: Once | ORAL | Status: DC | PRN

## 2021-12-20 MED ORDER — EPINEPHRINE PF 1 MG/ML IJ SOLN
INTRAMUSCULAR | Status: DC | PRN
Start: 2021-12-20 — End: 2021-12-20
  Administered 2021-12-20: 1 mg via SUBCUTANEOUS

## 2021-12-20 MED ORDER — KETAMINE HCL 10 MG/ML IJ SOLN
INTRAMUSCULAR | Status: DC | PRN
  Administered 2021-12-20: 10 mg via INTRAVENOUS
  Administered 2021-12-20: 30 mg via INTRAVENOUS
  Administered 2021-12-20: 10 mg via INTRAVENOUS

## 2021-12-20 MED ORDER — HYDROMORPHONE HCL 1 MG/ML IJ SOLN
INTRAMUSCULAR | Status: AC
  Filled 2021-12-20: qty 0.5

## 2021-12-20 MED ORDER — SODIUM CHLORIDE (PF) 0.9 % IJ SOLN
INTRAMUSCULAR | Status: DC | PRN
  Administered 2021-12-20: 100 mL

## 2021-12-20 MED ORDER — HYDROMORPHONE HCL 1 MG/ML IJ SOLN
1.0000 mg | INTRAMUSCULAR | Status: DC | PRN
  Administered 2021-12-21: 1 mg via INTRAVENOUS
  Filled 2021-12-20: qty 1

## 2021-12-20 MED ORDER — LACTATED RINGERS IV SOLN: INTRAVENOUS | Status: DC | PRN

## 2021-12-20 MED ORDER — DOCUSATE SODIUM 100 MG PO CAPS
100.0000 mg | ORAL_CAPSULE | Freq: Two times a day (BID) | ORAL | Status: DC
  Administered 2021-12-20 – 2021-12-21 (×2): 100 mg via ORAL
  Filled 2021-12-20 (×2): qty 1

## 2021-12-20 MED ORDER — CHLORHEXIDINE GLUCONATE CLOTH 2 % EX PADS: 6.0000 | MEDICATED_PAD | Freq: Once | CUTANEOUS | Status: DC

## 2021-12-20 MED ORDER — CHLORHEXIDINE GLUCONATE 0.12 % MT SOLN
15.0000 mL | OROMUCOSAL | Status: AC
  Filled 2021-12-20: qty 15

## 2021-12-20 MED ORDER — OXYCODONE HCL 5 MG PO TABS: 5.0000 mg | ORAL_TABLET | Freq: Once | ORAL | Status: DC | PRN

## 2021-12-20 MED ORDER — MIDAZOLAM HCL 2 MG/2ML IJ SOLN
INTRAMUSCULAR | Status: DC | PRN
Start: 2021-12-20 — End: 2021-12-20
  Administered 2021-12-20 (×2): 1 mg via INTRAVENOUS

## 2021-12-20 MED ORDER — 0.9 % SODIUM CHLORIDE (POUR BTL) OPTIME
TOPICAL | Status: DC | PRN
  Administered 2021-12-20: 1000 mL

## 2021-12-20 MED ORDER — ACETAMINOPHEN 10 MG/ML IV SOLN
INTRAVENOUS | Status: DC | PRN
  Administered 2021-12-20: 1000 mg via INTRAVENOUS

## 2021-12-20 MED ORDER — BUPIVACAINE LIPOSOME 1.3 % IJ SUSP
INTRAMUSCULAR | Status: AC
  Filled 2021-12-20: qty 20

## 2021-12-20 MED ORDER — KETAMINE HCL 100 MG/ML IJ SOLN
INTRAMUSCULAR | Status: AC
  Filled 2021-12-20: qty 1

## 2021-12-20 MED ORDER — MINERAL OIL LIGHT OIL
TOPICAL_OIL | Status: AC
  Administered 2021-12-20: 1 via TOPICAL
  Filled 2021-12-20: qty 40

## 2021-12-20 MED ORDER — ACETAMINOPHEN 500 MG PO TABS
1000.0000 mg | ORAL_TABLET | Freq: Four times a day (QID) | ORAL | Status: DC
  Administered 2021-12-20 – 2021-12-21 (×4): 1000 mg via ORAL
  Filled 2021-12-20 (×3): qty 2

## 2021-12-20 MED ORDER — CEFAZOLIN SODIUM-DEXTROSE 2-4 GM/100ML-% IV SOLN
2.0000 g | INTRAVENOUS | Status: AC
  Administered 2021-12-20 (×2): 2 g via INTRAVENOUS
  Filled 2021-12-20: qty 100

## 2021-12-20 MED ORDER — CEFAZOLIN SODIUM-DEXTROSE 2-4 GM/100ML-% IV SOLN
INTRAVENOUS | Status: AC
  Filled 2021-12-20: qty 100

## 2021-12-20 MED ORDER — OXYCODONE HCL 5 MG PO TABS
5.0000 mg | ORAL_TABLET | ORAL | Status: DC | PRN
  Administered 2021-12-20: 5 mg via ORAL
  Administered 2021-12-21: 10 mg via ORAL
  Administered 2021-12-21: 5 mg via ORAL
  Filled 2021-12-20 (×2): qty 1
  Filled 2021-12-20: qty 2

## 2021-12-20 MED ORDER — KETAMINE HCL 50 MG/5ML IJ SOSY
PREFILLED_SYRINGE | INTRAMUSCULAR | Status: AC
  Filled 2021-12-20: qty 5

## 2021-12-20 MED ORDER — ONDANSETRON HCL 4 MG/2ML IJ SOLN: 4.0000 mg | Freq: Four times a day (QID) | INTRAMUSCULAR | Status: DC | PRN

## 2021-12-20 MED ORDER — ONDANSETRON HCL 4 MG/2ML IJ SOLN
INTRAMUSCULAR | Status: AC
  Filled 2021-12-20: qty 4

## 2021-12-20 MED ORDER — ONDANSETRON 4 MG PO TBDP
4.0000 mg | ORAL_TABLET | Freq: Four times a day (QID) | ORAL | Status: DC | PRN
Start: 2021-12-20 — End: 2021-12-21
  Administered 2021-12-21: 4 mg via ORAL
  Filled 2021-12-20: qty 1

## 2021-12-20 MED ORDER — AMISULPRIDE (ANTIEMETIC) 5 MG/2ML IV SOLN: 10.0000 mg | Freq: Once | INTRAVENOUS | Status: DC | PRN

## 2021-12-20 MED ORDER — SODIUM BICARBONATE 4.2 % IV SOLN
INTRAVENOUS | Status: DC
  Filled 2021-12-20 (×2): qty 50

## 2021-12-20 MED ORDER — SODIUM CHLORIDE 0.9 % IV SOLN: INTRAVENOUS | Status: DC

## 2021-12-20 MED ORDER — FENTANYL CITRATE (PF) 250 MCG/5ML IJ SOLN
INTRAMUSCULAR | Status: AC
  Filled 2021-12-20: qty 5

## 2021-12-20 MED ORDER — FENTANYL CITRATE (PF) 100 MCG/2ML IJ SOLN
INTRAMUSCULAR | Status: AC
  Filled 2021-12-20: qty 2

## 2021-12-20 MED ORDER — PHENYLEPHRINE HCL-NACL 20-0.9 MG/250ML-% IV SOLN
INTRAVENOUS | Status: DC | PRN
  Administered 2021-12-20: 75 ug/min via INTRAVENOUS

## 2021-12-20 MED ORDER — ONDANSETRON HCL 4 MG/2ML IJ SOLN: 4.0000 mg | Freq: Once | INTRAMUSCULAR | Status: DC | PRN

## 2021-12-20 MED ORDER — EPHEDRINE 5 MG/ML INJ
INTRAVENOUS | Status: AC
  Filled 2021-12-20: qty 5

## 2021-12-20 MED ORDER — FENTANYL CITRATE (PF) 100 MCG/2ML IJ SOLN
25.0000 ug | INTRAMUSCULAR | Status: DC | PRN
  Administered 2021-12-20: 25 ug via INTRAVENOUS

## 2021-12-20 MED ORDER — PHENYLEPHRINE 40 MCG/ML (10ML) SYRINGE FOR IV PUSH (FOR BLOOD PRESSURE SUPPORT)
PREFILLED_SYRINGE | INTRAVENOUS | Status: DC | PRN
  Administered 2021-12-20: 120 ug via INTRAVENOUS

## 2021-12-20 MED ORDER — PHENYLEPHRINE 40 MCG/ML (10ML) SYRINGE FOR IV PUSH (FOR BLOOD PRESSURE SUPPORT)
PREFILLED_SYRINGE | INTRAVENOUS | Status: AC
  Filled 2021-12-20: qty 10

## 2021-12-20 MED ORDER — ROCURONIUM BROMIDE 10 MG/ML (PF) SYRINGE
PREFILLED_SYRINGE | INTRAVENOUS | Status: DC | PRN
  Administered 2021-12-20: 100 mg via INTRAVENOUS

## 2021-12-20 MED ORDER — DEXMEDETOMIDINE HCL IN NACL 200 MCG/50ML IV SOLN
INTRAVENOUS | Status: DC | PRN
  Administered 2021-12-20 (×2): 10 ug via INTRAVENOUS

## 2021-12-20 MED ORDER — SODIUM CHLORIDE 0.9 % IV SOLN
INTRAVENOUS | Status: DC | PRN
  Administered 2021-12-20: 40 mL

## 2021-12-20 MED ORDER — LIDOCAINE 2% (20 MG/ML) 5 ML SYRINGE
INTRAMUSCULAR | Status: AC
  Filled 2021-12-20: qty 15

## 2021-12-20 MED ORDER — DEXAMETHASONE SODIUM PHOSPHATE 10 MG/ML IJ SOLN
INTRAMUSCULAR | Status: AC
  Filled 2021-12-20: qty 2

## 2021-12-20 MED ORDER — CHLORHEXIDINE GLUCONATE 0.12 % MT SOLN
OROMUCOSAL | Status: AC
  Administered 2021-12-20: 15 mL via OROMUCOSAL
  Filled 2021-12-20: qty 15

## 2021-12-20 MED ORDER — ACETAMINOPHEN 10 MG/ML IV SOLN
INTRAVENOUS | Status: AC
  Filled 2021-12-20: qty 100

## 2021-12-20 MED ORDER — SUGAMMADEX SODIUM 200 MG/2ML IV SOLN
INTRAVENOUS | Status: DC | PRN
  Administered 2021-12-20: 400 mg via INTRAVENOUS

## 2021-12-20 MED ORDER — FENTANYL CITRATE (PF) 250 MCG/5ML IJ SOLN
INTRAMUSCULAR | Status: DC | PRN
  Administered 2021-12-20: 50 ug via INTRAVENOUS
  Administered 2021-12-20 (×2): 100 ug via INTRAVENOUS
  Administered 2021-12-20: 50 ug via INTRAVENOUS
  Administered 2021-12-20: 100 ug via INTRAVENOUS

## 2021-12-20 MED ORDER — PROPOFOL 10 MG/ML IV BOLUS
INTRAVENOUS | Status: DC | PRN
  Administered 2021-12-20: 160 mg via INTRAVENOUS

## 2021-12-20 MED ORDER — ROCURONIUM BROMIDE 10 MG/ML (PF) SYRINGE
PREFILLED_SYRINGE | INTRAVENOUS | Status: AC
  Filled 2021-12-20: qty 10

## 2021-12-20 MED ORDER — SODIUM CHLORIDE 0.9 % IV SOLN: INTRAVENOUS | Status: DC | PRN

## 2021-12-20 MED ORDER — ENOXAPARIN SODIUM 40 MG/0.4ML IJ SOSY
40.0000 mg | PREFILLED_SYRINGE | INTRAMUSCULAR | Status: DC
  Administered 2021-12-21: 40 mg via SUBCUTANEOUS
  Filled 2021-12-20: qty 0.4

## 2021-12-20 SURGICAL SUPPLY — 69 items
APL PRP STRL LF DISP 70% ISPRP (MISCELLANEOUS) ×2
APL SKNCLS STERI-STRIP NONHPOA (GAUZE/BANDAGES/DRESSINGS) ×2
BAG COUNTER SPONGE SURGICOUNT (BAG) ×1 IMPLANT
BAG SPNG CNTER NS LX DISP (BAG) ×1
BALL CTTN LRG ABS STRL LF (GAUZE/BANDAGES/DRESSINGS) ×3
BENZOIN TINCTURE PRP APPL 2/3 (GAUZE/BANDAGES/DRESSINGS) ×4 IMPLANT
BINDER BREAST 3XL (GAUZE/BANDAGES/DRESSINGS) ×1 IMPLANT
BIOPATCH RED 1 DISK 7.0 (GAUZE/BANDAGES/DRESSINGS) ×4 IMPLANT
BLADE SURG 10 STRL SS (BLADE) ×12 IMPLANT
BLADE SURG 15 STRL LF DISP TIS (BLADE) ×2 IMPLANT
BLADE SURG 15 STRL SS (BLADE) ×4
BNDG GAUZE ELAST 4 BULKY (GAUZE/BANDAGES/DRESSINGS) ×4 IMPLANT
CHLORAPREP W/TINT 26 (MISCELLANEOUS) ×4 IMPLANT
CLSR STERI-STRIP ANTIMIC 1/2X4 (GAUZE/BANDAGES/DRESSINGS) ×4 IMPLANT
COTTONBALL LRG STERILE PKG (GAUZE/BANDAGES/DRESSINGS) ×3 IMPLANT
DRAIN CHANNEL 15F RND FF W/TCR (WOUND CARE) ×4 IMPLANT
DRAPE HALF SHEET 40X57 (DRAPES) ×2 IMPLANT
DRAPE LAPAROSCOPIC ABDOMINAL (DRAPES) ×2 IMPLANT
DRSG MEPILEX BORDER 4X12 (GAUZE/BANDAGES/DRESSINGS) ×2 IMPLANT
DRSG MEPITEL 4X7.2 (GAUZE/BANDAGES/DRESSINGS) ×1 IMPLANT
DRSG MEPITEL 8X12 (GAUZE/BANDAGES/DRESSINGS) ×1 IMPLANT
DRSG PAD ABDOMINAL 8X10 ST (GAUZE/BANDAGES/DRESSINGS) ×3 IMPLANT
ELECT BLADE 4.0 EZ CLEAN MEGAD (MISCELLANEOUS) ×2
ELECT REM PT RETURN 9FT ADLT (ELECTROSURGICAL) ×2
ELECTRODE BLDE 4.0 EZ CLN MEGD (MISCELLANEOUS) ×1 IMPLANT
ELECTRODE REM PT RTRN 9FT ADLT (ELECTROSURGICAL) ×1 IMPLANT
EVACUATOR SILICONE 100CC (DRAIN) ×4 IMPLANT
GAUZE XEROFORM 5X9 LF (GAUZE/BANDAGES/DRESSINGS) ×1 IMPLANT
GLOVE SURG ENC MOIS LTX SZ7.5 (GLOVE) IMPLANT
GLOVE SURG ENC TEXT LTX SZ7.5 (GLOVE) ×2 IMPLANT
GLOVE SURG UNDER POLY LF SZ7.5 (GLOVE) ×4 IMPLANT
GOWN STRL REUS W/ TWL LRG LVL3 (GOWN DISPOSABLE) ×2 IMPLANT
GOWN STRL REUS W/TWL LRG LVL3 (GOWN DISPOSABLE) ×4
GOWN STRL REUS W/TWL XL LVL3 (GOWN DISPOSABLE) ×4 IMPLANT
KIT BASIN OR (CUSTOM PROCEDURE TRAY) ×2 IMPLANT
MARKER SKIN DUAL TIP RULER LAB (MISCELLANEOUS) ×2 IMPLANT
NDL HYPO 25GX1X1/2 BEV (NEEDLE) ×1 IMPLANT
NDL SAFETY ECLIPSE 18X1.5 (NEEDLE) IMPLANT
NDL SPNL 18GX3.5 QUINCKE PK (NEEDLE) ×1 IMPLANT
NEEDLE HYPO 18GX1.5 SHARP (NEEDLE)
NEEDLE HYPO 25GX1X1/2 BEV (NEEDLE) ×4 IMPLANT
NEEDLE SPNL 18GX3.5 QUINCKE PK (NEEDLE) ×2 IMPLANT
NS IRRIG 1000ML POUR BTL (IV SOLUTION) ×2 IMPLANT
PACK GENERAL/GYN (CUSTOM PROCEDURE TRAY) ×2 IMPLANT
PENCIL SMOKE EVACUATOR (MISCELLANEOUS) ×2 IMPLANT
PIN SAFETY STERILE (MISCELLANEOUS) ×2 IMPLANT
SHEET MEDIUM DRAPE 40X70 STRL (DRAPES) IMPLANT
SPONGE T-LAP 18X18 ~~LOC~~+RFID (SPONGE) ×5 IMPLANT
STAPLER INSORB 30 2030 C-SECTI (MISCELLANEOUS) ×2 IMPLANT
STAPLER VISISTAT 35W (STAPLE) ×2 IMPLANT
STRIP CLOSURE SKIN 1/2X4 (GAUZE/BANDAGES/DRESSINGS) ×6 IMPLANT
SUT CHROMIC 4 0 PS 2 18 (SUTURE) ×2 IMPLANT
SUT MNCRL AB 4-0 PS2 18 (SUTURE) ×1 IMPLANT
SUT MON AB 5-0 PS2 18 (SUTURE) ×3 IMPLANT
SUT PDS AB 3-0 SH 27 (SUTURE) ×10 IMPLANT
SUT PDS AB 4-0 SH 27 (SUTURE) ×4 IMPLANT
SUT SILK 2 0 SH (SUTURE) ×2 IMPLANT
SUT SILK 3 0 SH CR/8 (SUTURE) ×2 IMPLANT
SUT VIC AB 2-0 SH 27 (SUTURE) ×10
SUT VIC AB 2-0 SH 27XBRD (SUTURE) ×2 IMPLANT
SUT VIC AB 4-0 PS2 27 (SUTURE) ×2 IMPLANT
SUT VLOC 90 P-14 23 (SUTURE) ×4 IMPLANT
SYR 50ML LL SCALE MARK (SYRINGE) ×4 IMPLANT
SYR CONTROL 10ML LL (SYRINGE) ×4 IMPLANT
TOWEL GREEN STERILE FF (TOWEL DISPOSABLE) ×4 IMPLANT
TRAY FOLEY W/BAG SLVR 16FR (SET/KITS/TRAYS/PACK) ×2
TRAY FOLEY W/BAG SLVR 16FR ST (SET/KITS/TRAYS/PACK) IMPLANT
TUBE CONNECTING 12X1/4 (SUCTIONS) ×1 IMPLANT
UNDERPAD 30X36 HEAVY ABSORB (UNDERPADS AND DIAPERS) ×4 IMPLANT

## 2021-12-20 NOTE — Op Note (Addendum)
Operative Note   DATE OF OPERATION: 12/20/2021  LOCATION: [facility]   SURGICAL DEPARTMENT: Plastic Surgery  PREOPERATIVE DIAGNOSES: Bilateral symptomatic macromastia.  POSTOPERATIVE DIAGNOSES:  same  PROCEDURE: Bilateral breast reduction with free nipple graft.  SURGEON: Heywood Footman, MD  ASSISTANT: Izola Price, RNFA  ANESTHESIA: General.  COMPLICATIONS: None.   INDICATIONS FOR PROCEDURE:  The patient, Dawn Fowler is a 56 y.o. female born on [birth year], is here for treatment of bilateral symptomatic macromastia. MRN: [id number]  CONSENT:  Informed consent was obtained directly from the patient. Risks, benefits and alternatives were fully discussed. Specific risks including but not limited to bleeding, infection, hematoma, seroma, scarring, pain, infection, contracture, asymmetry, wound healing problems, nipple necrosis, nipple numbness and need for further surgery were all discussed. The patient did have an ample opportunity to have questions answered to satisfaction.   DESCRIPTION OF PROCEDURE:  The patient was marked preoperatively for a Wise pattern skin excision.  The patient was taken to the operating room. SCDs were placed and antibiotics were given. General anesthesia was administered.The patient's operative site was prepped and draped in a sterile fashion. A time out was performed and all information was confirmed to be correct.  Right Breast: The breast was infiltrated with 1 : 100,000 epinephrine solution to help with hemostasis.  The nipple was marked with a cookie cutter and excised in preparation for grafting.  The excision was then performed with cautery.  Hemostasis was obtained and the wound was stapled closed.  Left breast:  The breast was infiltrated with 1 : 100,000 epinephrine solution to help with hemostasis.  The nipple was marked with a cookie cutter and excised in preparation for grafting.  The excision was then performed with  cautery.  Hemostasis was obtained and the wound was stapled closed.  Patient was then checked for size and symmetry.  Minor modifications were made.  This resulted in a total of 1949 g removed from the right side and 1819 g removed from the left side.  The inframammary incision was closed with a combination of buried in-sorb staples and a running 3-0 V-lock 90 suture.  The vertical and limbs were closed with interrupted buried 4-0 PDS and a running 4-0 PDS suture.  Nipple location was marked with a 56mm cookie cutter and deepithelialized.  Nipple grafts were then thinned out and inset with 4-0 Chromic.  Xeroform and a cotton balls were used to fashion and bolster that was secured with 3-0 Nylon.  Steri-Strips were then applied along with a soft dressing and Ace wrap.  The first assistant was essential for optimal positioning and retraction throughout the case.  There was increased procedural difficulty and time spent compared to typical breast reductions due to the large breast size and need for free nipples based on measurements.  The patient tolerated the procedure well.  There were no complications. The patient was allowed to wake from anesthesia, extubated and taken to the recovery room in satisfactory condition.  I was present for the entire procedure.

## 2021-12-20 NOTE — Transfer of Care (Signed)
Immediate Anesthesia Transfer of Care Note  Patient: Dawn Fowler  Procedure(s) Performed: bilateral breast reduction with free nipple graft (Bilateral: Breast)  Patient Location: PACU  Anesthesia Type:General  Level of Consciousness: awake and alert   Airway & Oxygen Therapy: Patient Spontanous Breathing and Patient connected to face mask oxygen  Post-op Assessment: Report given to RN and Post -op Vital signs reviewed and stable  Post vital signs: Reviewed and stable  Last Vitals:  Vitals Value Taken Time  BP 130/101 12/20/21 1651  Temp    Pulse 85 12/20/21 1654  Resp 16 12/20/21 1654  SpO2 94 % 12/20/21 1654  Vitals shown include unvalidated device data.  Last Pain:  Vitals:   12/20/21 0955  TempSrc:   PainSc: 0-No pain      Patients Stated Pain Goal: 0 (40/69/86 1483)  Complications: No notable events documented.

## 2021-12-20 NOTE — Anesthesia Postprocedure Evaluation (Signed)
Anesthesia Post Note  Patient: Dawn Fowler  Procedure(s) Performed: bilateral breast reduction with free nipple graft (Bilateral: Breast)     Patient location during evaluation: PACU Anesthesia Type: General Level of consciousness: awake and alert, patient cooperative and oriented Pain management: pain level controlled Vital Signs Assessment: post-procedure vital signs reviewed and stable Respiratory status: spontaneous breathing, nonlabored ventilation, respiratory function stable and patient connected to nasal cannula oxygen Cardiovascular status: blood pressure returned to baseline and stable Postop Assessment: no apparent nausea or vomiting and adequate PO intake Anesthetic complications: no Comments: Pt says she's comfortable, breathing is back to her baseline, asking for something to drink   No notable events documented.  Last Vitals:  Vitals:   12/20/21 1137 12/20/21 1650  BP: (!) 187/114 (!) 130/101  Pulse:  85  Resp:  15  Temp:  36.5 C  SpO2:  94%    Last Pain:  Vitals:   12/20/21 1650  TempSrc:   PainSc: 7                  Negan Grudzien,E. Zayonna Ayuso

## 2021-12-20 NOTE — Anesthesia Procedure Notes (Signed)
Procedure Name: Intubation Date/Time: 12/20/2021 12:34 PM Performed by: Eligha Bridegroom, CRNA Pre-anesthesia Checklist: Patient identified, Emergency Drugs available, Suction available, Patient being monitored and Timeout performed Patient Re-evaluated:Patient Re-evaluated prior to induction Oxygen Delivery Method: Circle system utilized Preoxygenation: Pre-oxygenation with 100% oxygen Induction Type: IV induction Ventilation: Mask ventilation without difficulty and Oral airway inserted - appropriate to patient size Laryngoscope Size: Mac and 4 Grade View: Grade I Tube type: Oral Tube size: 7.0 mm Number of attempts: 1 Airway Equipment and Method: Stylet Placement Confirmation: ETT inserted through vocal cords under direct vision, positive ETCO2 and breath sounds checked- equal and bilateral Secured at: 21 cm Tube secured with: Tape Dental Injury: Teeth and Oropharynx as per pre-operative assessment

## 2021-12-20 NOTE — Interval H&P Note (Signed)
History and Physical Interval Note:  12/20/2021 11:29 AM  Dawn Fowler  has presented today for surgery, with the diagnosis of Breast Hypertrophy.  The various methods of treatment have been discussed with the patient and family. After consideration of risks, benefits and other options for treatment, the patient has consented to  Procedure(s) with comments: bilateral breast reduction with likely free nipple graft (Bilateral) - 4 hours as a surgical intervention.  The patient's history has been reviewed, patient examined, no change in status, stable for surgery.  I have reviewed the patient's chart and labs.  Questions were answered to the patient's satisfaction.     Lennice Sites

## 2021-12-20 NOTE — Progress Notes (Signed)
Anesthesia made aware of BP. No new orders at this time.

## 2021-12-21 ENCOUNTER — Other Ambulatory Visit: Payer: Self-pay

## 2021-12-21 ENCOUNTER — Emergency Department (HOSPITAL_COMMUNITY)
Admission: EM | Admit: 2021-12-21 | Discharge: 2021-12-22 | Disposition: A | Discharge status: Home / Self Care | Payer: Medicare Other | Source: Home / Self Care | Attending: Emergency Medicine | Admitting: Emergency Medicine

## 2021-12-21 ENCOUNTER — Encounter (HOSPITAL_COMMUNITY): Payer: Self-pay | Admitting: Plastic Surgery

## 2021-12-21 DIAGNOSIS — G8918 Other acute postprocedural pain: Secondary | ICD-10-CM | POA: Insufficient documentation

## 2021-12-21 DIAGNOSIS — E162 Hypoglycemia, unspecified: Secondary | ICD-10-CM | POA: Insufficient documentation

## 2021-12-21 DIAGNOSIS — R42 Dizziness and giddiness: Secondary | ICD-10-CM | POA: Insufficient documentation

## 2021-12-21 DIAGNOSIS — R55 Syncope and collapse: Secondary | ICD-10-CM | POA: Insufficient documentation

## 2021-12-21 LAB — BASIC METABOLIC PANEL
Anion gap: 14 (ref 5–15)
Anion gap: 13 (ref 5–15)
BUN: 32 mg/dL — ABNORMAL HIGH (ref 6–20)
BUN: 40 mg/dL — ABNORMAL HIGH (ref 6–20)
CO2: 17 mmol/L — ABNORMAL LOW (ref 22–32)
CO2: 19 mmol/L — ABNORMAL LOW (ref 22–32)
Calcium: 9.2 mg/dL (ref 8.9–10.3)
Calcium: 9.5 mg/dL (ref 8.9–10.3)
Chloride: 105 mmol/L (ref 98–111)
Chloride: 104 mmol/L (ref 98–111)
Creatinine, Ser: 1.78 mg/dL — ABNORMAL HIGH (ref 0.44–1.00)
Creatinine, Ser: 2.01 mg/dL — ABNORMAL HIGH (ref 0.44–1.00)
GFR, Estimated: 33 mL/min — ABNORMAL LOW (ref >60)
GFR, Estimated: 29 mL/min — ABNORMAL LOW (ref >60)
Glucose, Bld: 141 mg/dL — ABNORMAL HIGH (ref 70–99)
Glucose, Bld: 139 mg/dL — ABNORMAL HIGH (ref 70–99)
Potassium: 4.2 mmol/L (ref 3.5–5.1)
Potassium: 3.8 mmol/L (ref 3.5–5.1)
Sodium: 136 mmol/L (ref 135–145)
Sodium: 136 mmol/L (ref 135–145)

## 2021-12-21 LAB — CBC
HCT: 44 % (ref 36.0–46.0)
HCT: 43.4 % (ref 36.0–46.0)
Hemoglobin: 14 g/dL (ref 12.0–15.0)
Hemoglobin: 13.4 g/dL (ref 12.0–15.0)
MCH: 30.2 pg (ref 26.0–34.0)
MCH: 29.4 pg (ref 26.0–34.0)
MCHC: 31.8 g/dL (ref 30.0–36.0)
MCHC: 30.9 g/dL (ref 30.0–36.0)
MCV: 94.8 fL (ref 80.0–100.0)
MCV: 95.2 fL (ref 80.0–100.0)
Platelets: 252 10*3/uL (ref 150–400)
Platelets: 297 10*3/uL (ref 150–400)
RBC: 4.64 MIL/uL (ref 3.87–5.11)
RBC: 4.56 MIL/uL (ref 3.87–5.11)
RDW: 16.2 % — ABNORMAL HIGH (ref 11.5–15.5)
RDW: 16.3 % — ABNORMAL HIGH (ref 11.5–15.5)
WBC: 10.1 10*3/uL (ref 4.0–10.5)
WBC: 11.2 10*3/uL — ABNORMAL HIGH (ref 4.0–10.5)
nRBC: 0 % (ref 0.0–0.2)
nRBC: 0 % (ref 0.0–0.2)

## 2021-12-21 LAB — CBG MONITORING, ED: Glucose-Capillary: 140 mg/dL — ABNORMAL HIGH (ref 70–99)

## 2021-12-21 MED ORDER — CHLORHEXIDINE GLUCONATE CLOTH 2 % EX PADS
6.0000 | MEDICATED_PAD | Freq: Every day | CUTANEOUS | Status: DC
  Administered 2021-12-21: 6 via TOPICAL

## 2021-12-21 MED ORDER — ALLOPURINOL 100 MG PO TABS: 100.0000 mg | ORAL_TABLET | Freq: Every day | ORAL | 2 refills | Status: DC

## 2021-12-21 MED ORDER — ONDANSETRON 4 MG PO TBDP: 4.0000 mg | ORAL_TABLET | Freq: Four times a day (QID) | ORAL | 0 refills | Status: DC | PRN

## 2021-12-21 MED ORDER — DOCUSATE SODIUM 100 MG PO CAPS: 100.0000 mg | ORAL_CAPSULE | Freq: Two times a day (BID) | ORAL | 0 refills | Status: DC

## 2021-12-21 MED ORDER — OXYCODONE HCL 5 MG PO TABS: 5.0000 mg | ORAL_TABLET | ORAL | 0 refills | Status: DC | PRN

## 2021-12-21 MED ORDER — ONDANSETRON 4 MG PO TBDP
4.0000 mg | ORAL_TABLET | Freq: Once | ORAL | Status: AC
  Administered 2021-12-21: 4 mg via ORAL
  Filled 2021-12-21: qty 1

## 2021-12-21 NOTE — ED Provider Notes (Signed)
[facility] Emergency Department Provider Note MRN:  [id number]  Arrival date & time: 12/22/21     Chief Complaint   Dizziness and Near Syncope   History of Present Illness   Dawn Fowler is a 56 y.o. year-old female presents to the ED with chief complaint of syncope.  She underwent breast reduction surgery yesterday and was released from the hospital earlier today.  She states that upon arriving home and getting out of the car she passed out.  She states that she thinks that her oxygen tank had run out.  She normally wears 4 to 8 L 24/7.  She states that she feels fine now.  She did not sustain any injuries when she passed out.  She does complain of some postoperative pain.  She states that her surgical drains are draining well.  History provided by patient.  Review of Systems  Pertinent review of systems noted in HPI.    Physical Exam   Vitals:   12/21/21 2300 12/22/21 0000  BP: (!) 124/94 (!) 141/109  Pulse: 86 96  Resp: 20 17  Temp:    SpO2: 98% 100%    CONSTITUTIONAL:  well-appearing, NAD NEURO:  Alert and oriented x 3, CN 3-12 grossly intact EYES:  eyes equal and reactive ENT/NECK:  Supple, no stridor  CARDIO:  normal rate, regular rhythm, appears well-perfused, drains are in place and draining appropriately PULM:  No respiratory distress GI/GU:  non-distended,  MSK/SPINE:  No gross deformities, no edema, moves all extremities  SKIN:  no rash, atraumatic   *Additional and/or pertinent findings included in MDM below  Diagnostic and Interventional Summary    EKG Interpretation  Date/Time:  Wednesday December 21 2021 20:06:14 EST Ventricular Rate:  94 PR Interval:  182 QRS Duration: 80 QT Interval:  368 QTC Calculation: 460 R Axis:   96 Text Interpretation: Normal sinus rhythm Rightward axis Confirmed by Lajean Saver [phone]) on 12/21/2021 10:01:17 PM       Labs Reviewed  BASIC METABOLIC PANEL - Abnormal; Notable for the  following components:      Result Value   CO2 19 (*)    Glucose, Bld 139 (*)    BUN 40 (*)    Creatinine, Ser 2.01 (*)    GFR, Estimated 29 (*)    All other components within normal limits  CBC - Abnormal; Notable for the following components:   WBC 11.2 (*)    RDW 16.3 (*)    All other components within normal limits  CBG MONITORING, ED - Abnormal; Notable for the following components:   Glucose-Capillary 140 (*)    All other components within normal limits  URINALYSIS, ROUTINE W REFLEX MICROSCOPIC    No orders to display    Medications  oxyCODONE (Oxy IR/ROXICODONE) immediate release tablet 5 mg (has no administration in time range)  famotidine (PEPCID) tablet 20 mg (has no administration in time range)  ondansetron (ZOFRAN-ODT) disintegrating tablet 4 mg (4 mg Oral Given 12/21/21 2039)     Procedures  /  Critical Care Procedures  ED Course and Medical Decision Making  I have reviewed the triage vital signs, the nursing notes, and pertinent available records from the EMR.  Complexity of Problems Addressed: High Complexity: Acute illness/injury posing a threat to life or bodily function, requiring emergent diagnostic workup, evaluation, and treatment as below.  ED Course: After considering the following differential, bleeding, fluid loss, hypoglycemia, hypotension, I ordered labs, EKG.. I personally interpreted the labs which are  notable for no large change in HGB, currently 13.4 .  Case was discussed with the on-call for plastic surgery, green PA-C, who states that patient can follow-up in clinic tomorrow.  When I went to discharge the patient, patient asked that I call Dr. Erin Hearing, and gave me his phone number.  I discussed the case with him, and he was also agreeable with plan for discharge.  Social Determinants Affecting Care:     Consultants: I discussed the case with Dr. Erin Hearing, from plastic surgery, who recommends discharge.  Treatment and Plan: Patient had a  syncopal episode upon discharge from the hospital today.  She states that she thinks that her oxygen tank ran out.  She is normally on 4 to 8 L of oxygen 24/7.  She states that her drains have been draining normally.  She is not tachycardic nor hypotensive, I doubt excessive blood loss.  She has been stable while she has been in the emergency department.  After consultation with plastic surgery, feel that patient is stable for discharge home.  Emergency department workup does not suggest an emergent condition requiring admission or immediate intervention beyond  what has been performed at this time. The patient is safe for discharge and has  been instructed to return immediately for worsening symptoms, change in  symptoms or any other concerns    Final Clinical Impressions(s) / ED Diagnoses     ICD-10-CM   1. Syncope and collapse  R55       ED Discharge Orders     None         Discharge Instructions Discussed with and Provided to Patient:   Discharge Instructions   None      Montine Circle, PA-C 12/22/21 0044    Merryl Hacker, MD 12/22/21 [phone]

## 2021-12-21 NOTE — Plan of Care (Signed)
  Problem: Nutrition: Goal: Adequate nutrition will be maintained Outcome: Progressing   Problem: Pain Managment: Goal: General experience of comfort will improve Outcome: Progressing   Problem: Safety: Goal: Ability to remain free from injury will improve Outcome: Progressing   

## 2021-12-21 NOTE — ED Triage Notes (Addendum)
Pt presents to the ED from home via [facility] with complaints of nausea and syncopal episode. Pt states she had breast reduction surgery today, had just gotten home and was transferring from the car to the wheelchair and had syncopal episode, witnessed by family, woke up after 45 seconds and was fully a/ox4, denies hitting head, family informed EMS that she fell back into the wheelchair. Pt continues to complain of nausea. JP drains in place without complication at this time. Pt wears 4-8L of O2 at home. Pt was given oxycodone prior to leaving the hospital this afternoon for pain control.

## 2021-12-21 NOTE — Discharge Instructions (Signed)
May remove dressing tomorrow and shower with drain in.  Have water hit back.  Leave steri strips in place unless they are falling off and then you may remove.

## 2021-12-22 LAB — SURGICAL PATHOLOGY

## 2021-12-22 MED ORDER — OXYCODONE HCL 5 MG PO TABS
5.0000 mg | ORAL_TABLET | Freq: Once | ORAL | Status: AC
  Administered 2021-12-22: 5 mg via ORAL
  Filled 2021-12-22: qty 1

## 2021-12-22 MED ORDER — FAMOTIDINE 20 MG PO TABS
20.0000 mg | ORAL_TABLET | Freq: Once | ORAL | Status: AC
  Administered 2021-12-22: 20 mg via ORAL
  Filled 2021-12-22: qty 1

## 2021-12-23 ENCOUNTER — Telehealth: Payer: Self-pay

## 2021-12-23 NOTE — Telephone Encounter (Signed)
Patient left voicemail stating she does not have transportation for her appointments and would like to speak to someone about it.

## 2021-12-23 NOTE — Telephone Encounter (Signed)
Called and spoke with the patient regarding the message below.  Patient stated that she does not have a ride to her next appointment with [place], and she wanted to know if we can  set up a ride for her to come to her next appointment.    She stated that her Cardiologist setup rides for her with her insurance when she doesn't have a ride.    Informed the patient that we do not setup rides for our patient's.  Asked her to call her Cardiologist and ask them who she can call.  Patient verbalized understanding and stated that she will give the Cardiologist office a call.//AB/CMA

## 2021-12-26 ENCOUNTER — Encounter: Payer: Self-pay | Admitting: Plastic Surgery

## 2021-12-26 ENCOUNTER — Other Ambulatory Visit: Payer: Self-pay

## 2021-12-26 ENCOUNTER — Ambulatory Visit (INDEPENDENT_AMBULATORY_CARE_PROVIDER_SITE_OTHER): Payer: Medicare Other | Admitting: Plastic Surgery

## 2021-12-26 DIAGNOSIS — N62 Hypertrophy of breast: Secondary | ICD-10-CM

## 2021-12-26 NOTE — Discharge Summary (Signed)
Physician Discharge Summary  Patient ID: Dawn Fowler MRN: [id number] DOB/AGE: 05/22/1966 56 y.o.  Admit date: 12/20/2021 Discharge date: 12/21/2021  Admission Diagnoses:  Discharge Diagnoses:  Principal Problem:   Breast hypertrophy   Discharged Condition: good  Hospital Course: Patient had bilateral breast reduction with free nipple graft.  She was admitted for observation overnight since she had complex history and used home O2.  She was discharged without incident on 12/21/2021.  Consults: None  Significant Diagnostic Studies: none  Treatments: analgesia: Dilaudid  Discharge Exam: Blood pressure (!) 151/84, pulse 100, temperature 97.8 F (36.6 C), temperature source Axillary, resp. rate 20, height 5\' 1"  (1.549 m), weight 112.9 kg, SpO2 90 %. Breasts:  Incisions intact, no hematoma or seroma, bolster dressings intact.  Disposition: Discharge disposition: 01-Home or Self Care       Discharge Instructions     Call MD for:  difficulty breathing, headache or visual disturbances   Complete by: As directed    Call MD for:  extreme fatigue   Complete by: As directed    Call MD for:  hives   Complete by: As directed    Call MD for:  persistant dizziness or light-headedness   Complete by: As directed    Call MD for:  persistant nausea and vomiting   Complete by: As directed    Call MD for:  redness, tenderness, or signs of infection (pain, swelling, redness, odor or green/yellow discharge around incision site)   Complete by: As directed    Call MD for:  severe uncontrolled pain   Complete by: As directed    Call MD for:  temperature >100.4   Complete by: As directed    Diet - low sodium heart healthy   Complete by: As directed    Increase activity slowly   Complete by: As directed       Allergies as of 12/21/2021       Reactions   Adhesive [tape] Hives   Tolerates paper tape   Succinylcholine Other (See Comments)   Have trouble waking up   Tizanidine Hives         Medication List     TAKE these medications    acetaminophen 500 MG tablet Commonly known as: TYLENOL Take 1,000 mg by mouth every 6 (six) hours as needed for moderate pain or headache.   allopurinol 100 MG tablet Commonly known as: ZYLOPRIM Take 100 mg by mouth daily. What changed: Another medication with the same name was added. Make sure you understand how and when to take each.   allopurinol 100 MG tablet Commonly known as: Zyloprim Take 1 tablet (100 mg total) by mouth daily. What changed: You were already taking a medication with the same name, and this prescription was added. Make sure you understand how and when to take each.   apixaban 5 MG Tabs tablet Commonly known as: Eliquis Take 1 tablet (5 mg total) by mouth 2 (two) times daily.   colchicine 0.6 MG tablet Take 0.6 mg by mouth daily as needed (gout flare).   dapagliflozin propanediol 10 MG Tabs tablet Commonly known as: Farxiga Take 1 tablet (10 mg total) by mouth daily before breakfast. What changed: when to take this   diclofenac Sodium 1 % Gel Commonly known as: VOLTAREN Apply 2 g topically 3 (three) times daily as needed (pain.).   diphenhydramine-acetaminophen 25-500 MG Tabs tablet Commonly known as: TYLENOL PM Take 2 tablets by mouth at bedtime as needed (sleep).   docusate sodium  100 MG capsule Commonly known as: COLACE Take 1 capsule (100 mg total) by mouth 2 (two) times daily.   famotidine 20 MG tablet Commonly known as: PEPCID Take 1 tablet (20 mg total) by mouth 2 (two) times daily. What changed:  when to take this reasons to take this   [place] Take 2 each by mouth daily.   ipratropium-albuterol 0.5-2.5 (3) MG/3ML Soln Commonly known as: DUONEB Take 3 mLs by nebulization every 6 (six) hours as needed (wheezing/shortness of breath).   ondansetron 4 MG disintegrating tablet Commonly known as: ZOFRAN-ODT Take 1 tablet (4 mg total) by mouth every 6 (six)  hours as needed for nausea.   Opsumit 10 MG tablet Generic drug: macitentan Take 1 tablet (10 mg total) by mouth daily.   oxyCODONE 5 MG immediate release tablet Commonly known as: Roxicodone Take 1 tablet (5 mg total) by mouth every 4 (four) hours as needed for severe pain.   OXYGEN Inhale 2-8 L into the lungs continuous.   potassium chloride SA 20 MEQ tablet Commonly known as: KLOR-CON M Take 1 tablet (20 mEq total) by mouth daily.   sertraline 25 MG tablet Commonly known as: Zoloft Take 1 tablet (25 mg total) by mouth daily. What changed: when to take this   simethicone 80 MG chewable tablet Commonly known as: MYLICON Chew 41-740 mg by mouth 3 (three) times daily as needed (gas/flatulence).   spironolactone 25 MG tablet Commonly known as: ALDACTONE TAKE 1/2 TABLET(12.5 MG) BY MOUTH DAILY   tadalafil (PAH) 20 MG tablet Commonly known as: ADCIRCA Take 2 tablets (40 mg total) by mouth daily. What changed:  how much to take when to take this   torsemide 20 MG tablet Commonly known as: DEMADEX Take 2 tablets (40 mg total) by mouth 2 (two) times daily.   TUMS E-X PO Take 1-2 tablets by mouth 3 (three) times daily as needed (gas/abdominal pain.).   Uptravi 800 MCG Tabs Generic drug: Selexipag Take 1 tablet (800 mcg total) by mouth 2 (two) times daily.   Vitamin D3 125 MCG (5000 UT) Caps Take 5,000 Units by mouth in the morning.        Dr. Lennice Sites [address], [place] [address]  Signed: Lennice Sites 12/26/2021, 2:31 PM

## 2021-12-26 NOTE — Discharge Summary (Signed)
Duplicate note entered in error

## 2021-12-26 NOTE — Progress Notes (Signed)
Patient is status post bilateral breast reduction.  She went to the ER when she was lightheaded but work-up was negative.  Physical exam Incision clean dry intact, nipple grafts intact  Assessment and plan Patient is doing well status post bilateral breast reduction.  We will continue to follow her.

## 2021-12-27 NOTE — Telephone Encounter (Signed)
We can provide her with a letter. She can either pick up in office or front desk can fax  Have her remain out of work for 3-4 weeks, expected return on 01/19/22, no lift/push/pull > 15 lbs until 01/31/22

## 2022-01-02 ENCOUNTER — Ambulatory Visit: Payer: Medicare Other | Admitting: Plastic Surgery

## 2022-01-02 ENCOUNTER — Telehealth: Payer: Self-pay

## 2022-01-02 ENCOUNTER — Encounter: Payer: Self-pay | Admitting: Plastic Surgery

## 2022-01-02 ENCOUNTER — Other Ambulatory Visit: Payer: Self-pay

## 2022-01-02 ENCOUNTER — Ambulatory Visit (INDEPENDENT_AMBULATORY_CARE_PROVIDER_SITE_OTHER): Payer: Medicare Other | Admitting: Plastic Surgery

## 2022-01-02 DIAGNOSIS — M546 Pain in thoracic spine: Secondary | ICD-10-CM

## 2022-01-02 DIAGNOSIS — N62 Hypertrophy of breast: Secondary | ICD-10-CM

## 2022-01-02 DIAGNOSIS — G8929 Other chronic pain: Secondary | ICD-10-CM

## 2022-01-02 NOTE — Progress Notes (Signed)
Patient is status post bilateral breast reduction on 12/20/2021.  She is doing well.  She has not had any other episodes of lightheadedness. ? ?Physical exam ?Incisions clean dry and intact, good initial shape and symmetry, free nipple grafts intact ? ?Assessment and plan ?Tied off ends of sutures removed as well as remaining Steri-Strips.  Patient will see [place] back in 2 weeks for recheck. ?

## 2022-01-04 ENCOUNTER — Telehealth (HOSPITAL_COMMUNITY): Payer: Self-pay | Admitting: *Deleted

## 2022-01-04 ENCOUNTER — Telehealth: Payer: Self-pay

## 2022-01-04 NOTE — Telephone Encounter (Signed)
She needs appointment to sort this out.  Should be seen this week ideally, work in with me on Friday.  ?

## 2022-01-04 NOTE — Telephone Encounter (Addendum)
Pt called c/o shortness of breath, 20lb weight gain since breast reduction surgery, and her right leg is painful and swollen. Surgery was 12/21/21.  Pt said she has been off opsumit since discharge.  ? ?Please advise  ? ?Routed to [place]  ?

## 2022-01-04 NOTE — Telephone Encounter (Signed)
Patient called to say she is having fluid retention in her right leg and it's swollen.  She also wanted to let us know that her left breast is tight and uncomfortable.  Patient said the skin on the nipples of both of her breasts is peeling back and it's cracked and she can see "white meat".  She said she is changing the pads over them frequently because they have old blood on them.  Please call. ?

## 2022-01-05 ENCOUNTER — Telehealth (HOSPITAL_COMMUNITY): Payer: Self-pay | Admitting: Pharmacy Technician

## 2022-01-05 NOTE — Telephone Encounter (Signed)
Patient has been scheduled for tomorrow morning.

## 2022-01-05 NOTE — Telephone Encounter (Signed)
Pt contacted appt scheduled.  ?

## 2022-01-05 NOTE — Telephone Encounter (Signed)
Patient called in checking the status of the message below.  ?

## 2022-01-05 NOTE — Telephone Encounter (Signed)
Advanced Heart Failure Patient Advocate Encounter ? ?Received a message that the patient was out of Opsumit. Called [facility], representative stated that they did not receive the Opsumit rx that was sent in Jan, only the Tadalafil. Provided verbal rx. Requested that they get the patient's referral from the hub to be able to mail out. They already have the grant information and the PA approval information. Called and updated patient.  ? ?Will follow up with [facility] regarding shipment. Of note, patient will likely not restart Opsumit until the we get fluid until control.  ?

## 2022-01-06 ENCOUNTER — Other Ambulatory Visit: Payer: Self-pay

## 2022-01-06 ENCOUNTER — Ambulatory Visit (INDEPENDENT_AMBULATORY_CARE_PROVIDER_SITE_OTHER): Payer: Medicare Other | Admitting: Plastic Surgery

## 2022-01-06 ENCOUNTER — Encounter (HOSPITAL_COMMUNITY): Payer: Self-pay | Admitting: Cardiology

## 2022-01-06 ENCOUNTER — Ambulatory Visit (HOSPITAL_COMMUNITY)
Admission: RE | Admit: 2022-01-06 | Discharge: 2022-01-06 | Disposition: A | Discharge status: Home / Self Care | Payer: Medicare Other | Source: Ambulatory Visit | Attending: Cardiology | Admitting: Cardiology

## 2022-01-06 ENCOUNTER — Encounter: Payer: Self-pay | Admitting: Plastic Surgery

## 2022-01-06 VITALS — BP 160/90 | HR 71 | Wt 264.6 lb

## 2022-01-06 DIAGNOSIS — Z9889 Other specified postprocedural states: Secondary | ICD-10-CM | POA: Insufficient documentation

## 2022-01-06 DIAGNOSIS — R9431 Abnormal electrocardiogram [ECG] [EKG]: Secondary | ICD-10-CM | POA: Insufficient documentation

## 2022-01-06 DIAGNOSIS — Z86718 Personal history of other venous thrombosis and embolism: Secondary | ICD-10-CM | POA: Diagnosis not present

## 2022-01-06 DIAGNOSIS — I2721 Secondary pulmonary arterial hypertension: Secondary | ICD-10-CM | POA: Insufficient documentation

## 2022-01-06 DIAGNOSIS — J984 Other disorders of lung: Secondary | ICD-10-CM | POA: Insufficient documentation

## 2022-01-06 DIAGNOSIS — Z7901 Long term (current) use of anticoagulants: Secondary | ICD-10-CM | POA: Insufficient documentation

## 2022-01-06 DIAGNOSIS — Z9981 Dependence on supplemental oxygen: Secondary | ICD-10-CM | POA: Diagnosis not present

## 2022-01-06 DIAGNOSIS — I272 Pulmonary hypertension, unspecified: Secondary | ICD-10-CM | POA: Diagnosis not present

## 2022-01-06 DIAGNOSIS — Z7984 Long term (current) use of oral hypoglycemic drugs: Secondary | ICD-10-CM | POA: Insufficient documentation

## 2022-01-06 DIAGNOSIS — I5032 Chronic diastolic (congestive) heart failure: Secondary | ICD-10-CM | POA: Insufficient documentation

## 2022-01-06 DIAGNOSIS — N62 Hypertrophy of breast: Secondary | ICD-10-CM

## 2022-01-06 DIAGNOSIS — Z6841 Body Mass Index (BMI) 40.0 and over, adult: Secondary | ICD-10-CM | POA: Diagnosis not present

## 2022-01-06 DIAGNOSIS — I130 Hypertensive heart and chronic kidney disease with heart failure and stage 1 through stage 4 chronic kidney disease, or unspecified chronic kidney disease: Secondary | ICD-10-CM | POA: Diagnosis not present

## 2022-01-06 DIAGNOSIS — Z79899 Other long term (current) drug therapy: Secondary | ICD-10-CM | POA: Insufficient documentation

## 2022-01-06 DIAGNOSIS — Z8616 Personal history of COVID-19: Secondary | ICD-10-CM | POA: Insufficient documentation

## 2022-01-06 DIAGNOSIS — N184 Chronic kidney disease, stage 4 (severe): Secondary | ICD-10-CM | POA: Insufficient documentation

## 2022-01-06 DIAGNOSIS — E662 Morbid (severe) obesity with alveolar hypoventilation: Secondary | ICD-10-CM | POA: Diagnosis not present

## 2022-01-06 DIAGNOSIS — Z9989 Dependence on other enabling machines and devices: Secondary | ICD-10-CM | POA: Diagnosis not present

## 2022-01-06 LAB — BASIC METABOLIC PANEL
Anion gap: 11 (ref 5–15)
BUN: 36 mg/dL — ABNORMAL HIGH (ref 6–20)
CO2: 23 mmol/L (ref 22–32)
Calcium: 9.1 mg/dL (ref 8.9–10.3)
Chloride: 106 mmol/L (ref 98–111)
Creatinine, Ser: 2.19 mg/dL — ABNORMAL HIGH (ref 0.44–1.00)
GFR, Estimated: 26 mL/min — ABNORMAL LOW (ref >60)
Glucose, Bld: 84 mg/dL (ref 70–99)
Potassium: 4.1 mmol/L (ref 3.5–5.1)
Sodium: 140 mmol/L (ref 135–145)

## 2022-01-06 LAB — BRAIN NATRIURETIC PEPTIDE: B Natriuretic Peptide: 689.8 pg/mL — ABNORMAL HIGH (ref 0.0–100.0)

## 2022-01-06 MED ORDER — DOXYCYCLINE HYCLATE 50 MG PO CAPS: 100.0000 mg | ORAL_CAPSULE | Freq: Two times a day (BID) | ORAL | 0 refills | Status: AC

## 2022-01-06 MED ORDER — TORSEMIDE 20 MG PO TABS: ORAL_TABLET | ORAL | 11 refills | Status: DC

## 2022-01-06 MED ORDER — POTASSIUM CHLORIDE CRYS ER 20 MEQ PO TBCR: 20.0000 meq | EXTENDED_RELEASE_TABLET | Freq: Two times a day (BID) | ORAL | 11 refills | Status: DC

## 2022-01-06 NOTE — Patient Instructions (Signed)
Medication Changes: ? ?Increase Potassium to 20 Meq Twice daily ? ?Increase Torsemide to 40 mg in the morning and 60 mg in the evening for the next 3 days, then take 20 mg in the morning and 60 mg in the evening. ? ?Lab Work: ? ?Labs done today, your results will be available in MyChart, we will contact you for abnormal readings. ? ? ?Testing/Procedures: ? ?none ? ?Referrals: ? ?none ? ?Special Instructions // Education: ? ?WEAR YOUR COMPRESSION STOCKINGS AND PLEASE MAKE SURE THAT YOU HAVE A FULL TANK OF OXYGEN WITH YOU FOR YOUR APPOINTMENTS ? ?Follow-Up in: 2 WEEKS ? ?At the [facility], you and your health needs are our priority. We have a designated team specialized in the treatment of Heart Failure. This Care Team includes your primary Heart Failure Specialized Cardiologist (physician), Advanced Practice Providers (APPs- Physician Assistants and Nurse Practitioners), and Pharmacist who all work together to provide you with the care you need, when you need it.  ? ?You may see any of the following providers on your designated Care Team at your next follow up: ? ?Dr Glori Bickers ?Dr [name] ?Darrick Grinder, NP ?Lyda Jester, PA ?Jessica Milford,NP ?Marlyce Huge, PA ?Audry Riles, PharmD ? ? ?Please be sure to bring in all your medications bottles to every appointment.  ? ?Need to Contact us: ? ?If you have any questions or concerns before your next appointment please send [place] a message through [place] or call our office at [phone].   ? ?TO LEAVE A MESSAGE FOR THE NURSE SELECT OPTION 2, PLEASE LEAVE A MESSAGE INCLUDING: ?YOUR NAME ?DATE OF BIRTH ?CALL BACK NUMBER ?REASON FOR CALL**this is important as we prioritize the call backs ? ?YOU WILL RECEIVE A CALL BACK THE SAME DAY AS LONG AS YOU CALL BEFORE 4:00 PM ? ? ?

## 2022-01-06 NOTE — Progress Notes (Signed)
Patient is status post bilateral breast reduction on 12/20/2021.  She notices some increased pain and swelling of the left breast.  She also notes that her weight is up a little bit and she has some edema in her right leg. ? ?Physical exam ?Extremity: Very subtle swelling of the right lower extremity compared to the left no pain on palpation, ?Breast: Breasts are relatively symmetric.  Incisions are intact.  Nipple areola complexes with only a small amount of dermatochalasis.  Very slight erythema left greater than right.  No significant undrained fluid collection noted on physical exam. ? ?Assessment and plan ?-Patient's weight is up and she has some extremity swelling.  She is going to see cardiology today. ?-Regarding the patient's breast she has mild increase swelling on the left compared to the right.  I do not think she has any significant undrained fluid collection.  Started on doxycycline in case she has some grade infection starting on the left. ?

## 2022-01-07 NOTE — Progress Notes (Signed)
ID:  Dawn Fowler, DOB [birth year], MRN [id number]   Provider location: [place] [facility] Type of Visit: Established patient   PCP:  Hayden Rasmussen, MD  Cardiologist:  Dr. Aundra Dubin   History of Present Illness: Dawn Fowler is a 56 y.o. 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  She is on CPAP at home and 6L home oxygen (says she has been on oxygen for "years.").  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  She additionally has significant HTN.  She had been on Opsumit 10 + tadalafil 20 mg daily but ran out of this prior to her 2/21 admission. She also has a history of prior DVT after long train ride, has had 2 negative V/Q scans.  She was hospitalized for COVID-19 at [facility] in 1/21.    She was seen by [facility]monology after discharge from [place] admission and was noted to be volume overloaded, Lasix was changed to torsemide but she was unable to get to pharmacy to pick up. Subsequently, she developed progressive wt gain, nearly 20 lb and increased dyspnea, prompting her to report back to the ED in 2/21, where she was found to be in acute on chronic diastolic HF and readmitted for IV diuresis and AHF consultation. Echo in 2/21 showed LVEF 55-60%, moderate RV dilation/moderate RV dysfunction. [place] 2/21 showed elevated left and right heart filling pressures with severe pulmonary hypertension. PFTs completed showing severe restriction and severely decreased DLCO. High resolution CT was concerning  for small vessel disease, no ILD, and concerning for PAH.  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 She was continued on IV diuretics and had good response, diuresing 32 lb. She was transitioned  to torsemide 40 mg/20 mg. Opsumit and tadalafil restarted.   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  She is on Eliquis. RHC in 9/21 showed normal RA pressure and PCWP, moderate-severe pulmonary arterial hypertension.   Echo 5/22 showed EF 55% with mildly enlarged RV with mild RV dysfunction, mildly D-shaped septum, PASP 48 with normal IVC.   In 2/23, she had bilateral breast reduction surgery.   She presents today for followup of CHF/RV failure and pulmonary hypertension. Since her surgery, she reports worsening dyspnea and edema. She continues to use 4L home oxygen and CPAP.  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  She is short of breath walking around her house.  Short of breath with housework.  No chest pain.  No lightheadedness.  Weight is up 7 lbs.    Labs (6/21): K 4.5, creatinine 2.09 Labs (8/21): K 4.3, creatinine 2.11 Labs (9/21): K 3.9, creatinine 2.0 Labs (10/21): BNP 58 Labs (12/21): K 3.8, creatinine 2.28 Labs (1/22): BNP 160, K 3.7, creatinine 1.79 Labs (3/22): BNP 15.7, K 3.9, creatinine 2.33 Labs (5/22): K 3.8, creatinine 2.25, BNP 117 Labs (8/22): K 3.9, creatinine 2.16, BNP 198 Labs (1/23): BNP 276 Labs (2/23): K 3.8, creatinine 2.01  ECG (personally reviewed): NSR, left axis deviation, low voltage.   6 minute walk (9/21): 168 m 6 minute walk (10/21): 122 m 6 minute walk (3/22): 107 m  PMH: 1. OHS/OSA: Uses home oxygen.  2. CKD stage IV 3. COVID-19 PNA in 12/20 4. Restrictive lung disease: PFTs (2/21) with severe restriction, severely decreased DLCO.  - High resolution CT chest (2/21): no interstitial lung disease, "small vessel disease."  - Breast reduction surgery 2/23 5. Gout 6. HTN 7. DVT: Remote, after train ride.  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  8. Pulmonary hypertension: RHC (2/21) with mean RA 14, PA 89/33 mean 54, mean PCWP 34, CI 2.22, PVR 3.87 WU => mixed pulmonary  arterial/pulmonary venous hypertension.  - V/Q scan (2/21): No evidence for chronic PE.  - Echo (2/21): EF 55-60%, moderately dilated RV with moderately decreased systolic function.  - HIV and rheumatologic serologies negative.  - RHC (9/21): mean RA 9, PA 68/23 mean 40, mean PCWP 11, PVR 5.6 WU, no step up in oxygen saturation so no evidence for left to right shunting.  - Echo (5/22): EF 55% with mildly enlarged RV with mild RV dysfunction, mildly D-shaped septum, PASP 48 with normal IVC. 9. Depression   Current Outpatient Medications  Medication Sig Dispense Refill   acetaminophen (TYLENOL) 500 MG tablet Take 1,000 mg by mouth every 6 (six) hours as needed for moderate pain or headache.     allopurinol (ZYLOPRIM) 100 MG tablet Take 1 tablet (100 mg total) by mouth daily. 30 tablet 2   apixaban (ELIQUIS) 5 MG TABS tablet Take 1 tablet (5 mg total) by mouth 2 (two) times daily. 60 tablet 9   Calcium Carbonate Antacid (TUMS E-X PO) Take 1-2 tablets by mouth 3 (three) times daily as needed (gas/abdominal pain.).     Cholecalciferol (VITAMIN D3) 125 MCG (5000 UT) CAPS Take 5,000 Units by mouth in the morning.     colchicine 0.6 MG tablet Take 0.6 mg by mouth daily as needed (gout flare).     dapagliflozin propanediol (FARXIGA) 10 MG TABS tablet Take 1 tablet (10 mg total) by mouth daily before breakfast. 30 tablet 11   diclofenac Sodium (VOLTAREN) 1 % GEL Apply 2 g topically 3 (three) times daily as needed (pain.).     diphenhydramine-acetaminophen (TYLENOL PM) 25-500 MG TABS tablet Take 2 tablets by mouth at bedtime as needed (sleep).     docusate sodium (COLACE) 100 MG capsule Take 1 capsule (100 mg total) by mouth 2 (two) times daily. 10 capsule 0   doxycycline (VIBRAMYCIN) 50 MG capsule Take 2 capsules (100 mg total) by mouth 2 (two) times daily for 10 days. 40 capsule 0   famotidine (PEPCID) 20 MG tablet Take 1 tablet (20 mg total) by mouth 2 (two) times daily. 30 tablet 3    ipratropium-albuterol (DUONEB) 0.5-2.5 (3) MG/3ML SOLN Take 3 mLs by nebulization every 6 (six) hours as needed (wheezing/shortness of breath).     ondansetron (ZOFRAN-ODT) 4 MG disintegrating tablet Take 1 tablet (4 mg total) by mouth every 6 (six) hours as needed for nausea. 20 tablet 0   oxyCODONE (ROXICODONE) 5 MG immediate release tablet Take 1 tablet (5 mg total) by mouth every 4 (four) hours as needed for severe pain. 30 tablet 0   OXYGEN Inhale 2-8 L into the lungs continuous.     Selexipag (UPTRAVI) 800 MCG TABS Take 1 tablet (800 mcg total) by mouth 2 (two) times daily. 60 tablet 11   sertraline (ZOLOFT) 25 MG tablet Take 1 tablet (25 mg total) by mouth daily. 30 tablet 2   simethicone (MYLICON) 80 MG chewable tablet  Chew 80-160 mg by mouth 3 (three) times daily as needed (gas/flatulence).     spironolactone (ALDACTONE) 25 MG tablet TAKE 1/2 TABLET(12.5 MG) BY MOUTH DAILY 45 tablet 3   tadalafil, PAH, (ADCIRCA) 20 MG tablet Take 20 mg by mouth 2 (two) times daily.     macitentan (OPSUMIT) 10 MG tablet Take 1 tablet (10 mg total) by mouth daily. (Patient not taking: Reported on 01/06/2022) 90 tablet 3   potassium chloride SA (KLOR-CON M) 20 MEQ tablet Take 1 tablet (20 mEq total) by mouth 2 (two) times daily. 60 tablet 11   torsemide (DEMADEX) 20 MG tablet Take 1 tablet (20 mg total) by mouth every morning AND 3 tablets (60 mg total) every evening. 100 tablet 11   No current facility-administered medications for this encounter.    Allergies  Allergen Reactions   Adhesive [Tape] Hives    Tolerates paper tape   Succinylcholine Other (See Comments)    Have trouble waking up   Tizanidine Hives      Social History   Socioeconomic History   Marital status: Single    Spouse name: Not on file   Number of children: Not on file   Years of education: Not on file   Highest education level: Not on file  Occupational History   Not on file  Tobacco Use   Smoking status: Former    Smokeless tobacco: Never   Tobacco comments:    only smoked for fun in highschool not even one a day  Vaping Use   Vaping Use: Never used  Substance and Sexual Activity   Alcohol use: Yes    Comment: occ   Drug use: Not Currently   Sexual activity: Yes    Partners: Male    Comment: 1st intercourse- 28, partners- 76, current partner- 10 yrs   Other Topics Concern   Not on file  Social History Narrative   Not on file   Social Determinants of Health   Financial Resource Strain: Not on file  Food Insecurity: Not on file  Transportation Needs: Not on file  Physical Activity: Not on file  Stress: Not on file  Social Connections: Not on file  Intimate Partner Violence: Not on file      Family History  Problem Relation Age of Onset   COPD Father    Cancer Mother        gallbladder and bladder    Hypertension Brother    Breast cancer Other    Diabetes Son    Breast cancer Cousin    Breast cancer Cousin    Pulmonary Hypertension Neg Hx     Vitals:   01/06/22 1138  BP: (!) 160/90  Pulse: 71  SpO2: 98%  Weight: 120 kg (264 lb 9.6 oz)   Wt Readings from Last 3 Encounters:  01/06/22 120 kg (264 lb 9.6 oz)  12/21/21 112 kg (247 lb)  12/20/21 112.9 kg (249 lb)   Exam:   BP (!) 160/90    Pulse 71    Wt 120 kg (264 lb 9.6 oz)    LMP  (LMP Unknown) Comment: pmb last 2-3 months   SpO2 98% Comment: 4 l N/C   BMI 50.00 kg/m  General: NAD Neck: JVP 10-12 cm, no thyromegaly or thyroid nodule.  Lungs: Distant BS CV: Nondisplaced PMI.  Heart regular S1/S2, no S3/S4, no murmur.  1+ edema to knees.  No carotid bruit.  Normal pedal pulses.  Abdomen: Soft, nontender, no hepatosplenomegaly, no distention.  Skin: Intact without lesions or rashes.  Neurologic: Alert and oriented x 3.  Psych: Normal affect. Extremities: No clubbing or cyanosis.  HEENT: Normal.   ASSESSMENT & PLAN:  1. Chronic diastolic CHF with significant RV dysfunction: Echo in 2/21 with LVEF 55-60%, moderate RV  dilation/moderate RV dysfunction.  [place] 2/18 showed elevated left and right heart filling pressures with severe pulmonary hypertension.  Suspect mixed pulmonary venous/pulmonary arterial hypertension in setting of significant LV diastolic dysfunction (PVR 3.87 WU).  Repeat RHC in 9/21 with moderate-severe PAH, normal filling pressures, and PVR 5.6 WU.  No evidence for left=>right shunting.  Echo in 5/22 showed EF 55% with mildly enlarged RV with mild RV dysfunction, mildly D-shaped septum, PASP 48 with normal IVC.  NYHA class IIIb symptoms with volume overload on exam and weight up 7 lbs.  Symptoms worse since surgery, suspect she gained significant volume around the time of her breast reduction surgery.   - Increase torsemide to 40 qam/60 qpm x 3 days then 20 qam/60 qpm after that.  She wants to take the higher dose of torsemide in the afternoon as it interrupts her work if she takes it in the morning.  - Increase KCl to 20 mEq bid.  - Continue Farxiga 10 mg daily.  - Wear compression stockings.  2. Pulmonary hypertension: Suspect that this is a mixed picture with group 2 and group 3 PH (related to diastolic LV dysfunction and OHS/OSA).  However, concerned for component of group 1 PAH as well.  V/Q scan not suggestive of chronic PEs and high resolution CT chest in 2/21 was not suggestive of ILD. HIV and rheumatologic serologies negative.  RHC showed severe mixed pulmonary venous/pulmonary arterial hypertension, repeat RHC after diuresis in 9/21 showed PAH. PFTs completed in 2/21 showed severe restriction, severely decreased DLCO (?restriction due to body habitus).  Echo in 5/22 showed mild improvement in RV and improvement in estimated PA systolic pressure.  She is on Opsumit, tadalafil and Uptravi 800 mcg bid.   - 6 minute walk next appointment (unstable today with volume overload).  - Continue to maintain oxygen saturation with home O2 and CPAP. Weight loss also would be helpful.  She has had a breast  reduction surgery which may decrease lung restriction.  - Continue Opsumit 10 mg daily (has been out for several days, pharmacist working to restart). - Continue tadalafil 40 mg daily.  - She is tolerating Uptravi 800 mcg bid now, she has not tolerate uptitration.  - Check BNP today 3. HTN: BP high today but short of breath walking coming in. Will recheck next appt.  4. CKD stage 3 - BMET today.  - Followup with nephrology.  5. OHS/OSA: Now s/p breast reduction surgery which hopefully will help with OHS.  compliant w/ CPAP and home oxygen.  6. 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  As she is at baseline sedentary and has had recurrent DVTs, would continue Eliquis long-term.   7. Restrictive lung disease: Now s/p breast reduction surgery.   Followup in 1 week with APP to reassess volume.   Signed, [name], MD  01/07/2022  [facility] [address] [facility] [place] [address] [phone] (office) [phone] (fax)

## 2022-01-13 ENCOUNTER — Encounter (HOSPITAL_COMMUNITY): Payer: Self-pay | Admitting: Cardiology

## 2022-01-13 ENCOUNTER — Ambulatory Visit (HOSPITAL_COMMUNITY)
Admission: RE | Admit: 2022-01-13 | Discharge: 2022-01-13 | Disposition: A | Discharge status: Home / Self Care | Payer: Medicare Other | Source: Ambulatory Visit | Attending: Cardiology | Admitting: Cardiology

## 2022-01-13 ENCOUNTER — Encounter: Payer: Medicare Other | Admitting: Surgical

## 2022-01-13 ENCOUNTER — Ambulatory Visit: Payer: Medicare Other | Admitting: Plastic Surgery

## 2022-01-13 ENCOUNTER — Encounter: Payer: Self-pay | Admitting: Plastic Surgery

## 2022-01-13 ENCOUNTER — Other Ambulatory Visit: Payer: Self-pay

## 2022-01-13 VITALS — BP 130/80 | HR 72 | Wt 259.4 lb

## 2022-01-13 DIAGNOSIS — Z9989 Dependence on other enabling machines and devices: Secondary | ICD-10-CM | POA: Diagnosis not present

## 2022-01-13 DIAGNOSIS — Z8616 Personal history of COVID-19: Secondary | ICD-10-CM | POA: Insufficient documentation

## 2022-01-13 DIAGNOSIS — Z9981 Dependence on supplemental oxygen: Secondary | ICD-10-CM | POA: Diagnosis not present

## 2022-01-13 DIAGNOSIS — G4733 Obstructive sleep apnea (adult) (pediatric): Secondary | ICD-10-CM | POA: Diagnosis not present

## 2022-01-13 DIAGNOSIS — N184 Chronic kidney disease, stage 4 (severe): Secondary | ICD-10-CM | POA: Diagnosis not present

## 2022-01-13 DIAGNOSIS — Z7901 Long term (current) use of anticoagulants: Secondary | ICD-10-CM | POA: Insufficient documentation

## 2022-01-13 DIAGNOSIS — Z86718 Personal history of other venous thrombosis and embolism: Secondary | ICD-10-CM | POA: Insufficient documentation

## 2022-01-13 DIAGNOSIS — I5032 Chronic diastolic (congestive) heart failure: Secondary | ICD-10-CM | POA: Diagnosis not present

## 2022-01-13 DIAGNOSIS — Z09 Encounter for follow-up examination after completed treatment for conditions other than malignant neoplasm: Secondary | ICD-10-CM | POA: Diagnosis not present

## 2022-01-13 DIAGNOSIS — I2721 Secondary pulmonary arterial hypertension: Secondary | ICD-10-CM | POA: Insufficient documentation

## 2022-01-13 DIAGNOSIS — I130 Hypertensive heart and chronic kidney disease with heart failure and stage 1 through stage 4 chronic kidney disease, or unspecified chronic kidney disease: Secondary | ICD-10-CM | POA: Insufficient documentation

## 2022-01-13 DIAGNOSIS — J984 Other disorders of lung: Secondary | ICD-10-CM | POA: Insufficient documentation

## 2022-01-13 DIAGNOSIS — Z79899 Other long term (current) drug therapy: Secondary | ICD-10-CM | POA: Insufficient documentation

## 2022-01-13 DIAGNOSIS — I50810 Right heart failure, unspecified: Secondary | ICD-10-CM | POA: Diagnosis not present

## 2022-01-13 DIAGNOSIS — I5033 Acute on chronic diastolic (congestive) heart failure: Secondary | ICD-10-CM | POA: Diagnosis present

## 2022-01-13 DIAGNOSIS — N62 Hypertrophy of breast: Secondary | ICD-10-CM

## 2022-01-13 LAB — BASIC METABOLIC PANEL
Anion gap: 8 (ref 5–15)
BUN: 33 mg/dL — ABNORMAL HIGH (ref 6–20)
CO2: 27 mmol/L (ref 22–32)
Calcium: 8.8 mg/dL — ABNORMAL LOW (ref 8.9–10.3)
Chloride: 107 mmol/L (ref 98–111)
Creatinine, Ser: 1.94 mg/dL — ABNORMAL HIGH (ref 0.44–1.00)
GFR, Estimated: 30 mL/min — ABNORMAL LOW (ref >60)
Glucose, Bld: 112 mg/dL — ABNORMAL HIGH (ref 70–99)
Potassium: 4 mmol/L (ref 3.5–5.1)
Sodium: 142 mmol/L (ref 135–145)

## 2022-01-13 LAB — BRAIN NATRIURETIC PEPTIDE: B Natriuretic Peptide: 543.1 pg/mL — ABNORMAL HIGH (ref 0.0–100.0)

## 2022-01-13 NOTE — Progress Notes (Signed)
Patient is status post bilateral breast reduction with free nipple on 12/20/2021.  She notes some pain in her drain sites and is concerned about her nipples. ? ?Physical exam ?Typical epidermolysis of bilateral nipples.  Incisions intact. ? ?Assessment and plan  ?Will see the patient back in 3 to 4 weeks for recheck. ?

## 2022-01-13 NOTE — Patient Instructions (Signed)
Increase torsemide to 40 mg in the morning and 60 mg in the evening for 3 days, then go to 20 mg in the morning and 60 mg in the evening. ? ?Labs done today, your results will be available in MyChart, we will contact you for abnormal readings. ? ?Your physician recommends that you schedule a follow-up appointment in: 3 weeks ? ?If you have any questions or concerns before your next appointment please send [place] a message through [place] or call our office at [phone].   ? ?TO LEAVE A MESSAGE FOR THE NURSE SELECT OPTION 2, PLEASE LEAVE A MESSAGE INCLUDING: ?YOUR NAME ?DATE OF BIRTH ?CALL BACK NUMBER ?REASON FOR CALL**this is important as we prioritize the call backs ? ?YOU WILL RECEIVE A CALL BACK THE SAME DAY AS LONG AS YOU CALL BEFORE 4:00 PM ? ?At the [facility], you and your health needs are our priority. As part of our continuing mission to provide you with exceptional heart care, we have created designated Provider Care Teams. These Care Teams include your primary Cardiologist (physician) and Advanced Practice Providers (APPs- Physician Assistants and Nurse Practitioners) who all work together to provide you with the care you need, when you need it.  ? ?You may see any of the following providers on your designated Care Team at your next follow up: ?Dr Glori Bickers ?Dr [name] ?Darrick Grinder, NP ?Lyda Jester, PA ?Jessica Milford,NP ?Marlyce Huge, PA ?Audry Riles, PharmD ? ? ?Please be sure to bring in all your medications bottles to every appointment.  ? ? ?

## 2022-01-15 ENCOUNTER — Other Ambulatory Visit (HOSPITAL_COMMUNITY): Payer: Self-pay | Admitting: Cardiology

## 2022-01-15 NOTE — Progress Notes (Signed)
?  ? ? ?ID:  Dawn Fowler, DOB 19-Mar-1966, MRN [id number]   ?Provider location: [place] [facility] ?Type of Visit: Established patient  ? ?PCP:  Hayden Rasmussen, MD  ?Cardiologist:  Dr. Aundra Dubin ?  ?History of Present Illness: ?Dawn Fowler is a 56 y.o. 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  She is on CPAP at home and 6L home oxygen (says she has been on oxygen for "years.").  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  She additionally has significant HTN.  She had been on Opsumit 10 + tadalafil 20 mg daily but ran out of this prior to her 2/21 admission. She also has a history of prior DVT after long train ride, has had 2 negative V/Q scans.  She was hospitalized for COVID-19 at [facility] in 1/21.  ?  ?She was seen by [facility] Pulmonology after discharge from [place] admission and was noted to be volume overloaded, Lasix was changed to torsemide but she was unable to get to pharmacy to pick up. Subsequently, she developed progressive wt gain, nearly 20 lb and increased dyspnea, prompting her to report back to the ED in 2/21, where she was found to be in acute on chronic diastolic HF and readmitted for IV diuresis and AHF consultation. Echo in 2/21 showed LVEF 55-60%, moderate RV dilation/moderate RV dysfunction. [place] 2/21 showed elevated left and right heart filling pressures with severe pulmonary hypertension. PFTs completed showing severe restriction and severely decreased DLCO. High resolution CT was concerning  for small vessel disease, no ILD, and concerning for PAH.  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 She was continued on IV diuretics and had good response, diuresing 32 lb. She was transitioned  to torsemide 40 mg/20 mg. Opsumit and tadalafil restarted.  ? 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  She is on Eliquis. RHC in 9/21 showed normal RA pressure and PCWP, moderate-severe pulmonary arterial hypertension.  ? ?Echo 5/22 showed EF 55% with mildly enlarged RV with mild RV dysfunction, mildly D-shaped septum, PASP 48 with normal IVC.  ? ?In 2/23, she had bilateral breast reduction surgery.  ? ?She presents today for followup of CHF/RV failure and pulmonary hypertension. Post-op breast reduction surgery, she developed significant volume overload. I increased her torsemide at last appointment.  Today, weight is down 5 lbs.  Breathing is better.  She is still short of breath if she walks about 100 feet or up an incline/stairs.  No orthopnea/PND.  No lightheadedness.    ? ?Labs (6/21): K 4.5, creatinine 2.09 ?Labs (8/21): K 4.3, creatinine 2.11 ?Labs (9/21): K 3.9, creatinine 2.0 ?Labs (10/21): BNP 58 ?Labs (12/21): K 3.8, creatinine 2.28 ?Labs (1/22): BNP 160, K 3.7, creatinine 1.79 ?Labs (3/22): BNP 15.7, K 3.9, creatinine 2.33 ?Labs (5/22): K 3.8, creatinine 2.25, BNP 117 ?Labs (8/22): K 3.9, creatinine 2.16, BNP 198 ?Labs (1/23): BNP 276 ?Labs (2/23): K 3.8, creatinine 2.01 ?Labs (3/23): K 4.1, creatinine 2.19 ? ?6 minute walk (9/21): 168 m ?6 minute walk (10/21): 122 m ?6 minute walk (3/22): 107 m ? ?PMH: ?1. OHS/OSA: Uses home oxygen.  ?2. CKD stage IV ?3. COVID-19 PNA in  12/20 ?4. Restrictive lung disease: PFTs (2/21) with severe restriction, severely decreased DLCO.  ?- High resolution CT chest (2/21): no interstitial lung disease, "small vessel disease."  ?- Breast reduction surgery 2/23 ?5. Gout ?6. HTN ?7. DVT: Remote, after train ride.  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  ?8. Pulmonary hypertension: RHC (2/21) with mean RA 14, PA 89/33 mean 54, mean PCWP 34, CI 2.22, PVR 3.87 WU => mixed pulmonary arterial/pulmonary venous hypertension.  ?- V/Q scan  (2/21): No evidence for chronic PE.  ?- Echo (2/21): EF 55-60%, moderately dilated RV with moderately decreased systolic function.  ?- HIV and rheumatologic serologies negative.  ?- RHC (9/21): mean RA 9, PA 68/23 mean 40, mean PCWP 11, PVR 5.6 WU, no step up in oxygen saturation so no evidence for left to right shunting.  ?- Echo (5/22): EF 55% with mildly enlarged RV with mild RV dysfunction, mildly D-shaped septum, PASP 48 with normal IVC. ?9. Depression ? ? ?Current Outpatient Medications  ?Medication Sig Dispense Refill  ? acetaminophen (TYLENOL) 500 MG tablet Take 1,000 mg by mouth every 6 (six) hours as needed for moderate pain or headache.    ? allopurinol (ZYLOPRIM) 100 MG tablet Take 1 tablet (100 mg total) by mouth daily. 30 tablet 2  ? apixaban (ELIQUIS) 5 MG TABS tablet Take 1 tablet (5 mg total) by mouth 2 (two) times daily. 60 tablet 9  ? Calcium Carbonate Antacid (TUMS E-X PO) Take 1-2 tablets by mouth 3 (three) times daily as needed (gas/abdominal pain.).    ? Cholecalciferol (VITAMIN D3) 125 MCG (5000 UT) CAPS Take 5,000 Units by mouth in the morning.    ? colchicine 0.6 MG tablet Take 0.6 mg by mouth daily as needed (gout flare).    ? dapagliflozin propanediol (FARXIGA) 10 MG TABS tablet Take 1 tablet (10 mg total) by mouth daily before breakfast. 30 tablet 11  ? diclofenac Sodium (VOLTAREN) 1 % GEL Apply 2 g topically 3 (three) times daily as needed (pain.).    ? diphenhydramine-acetaminophen (TYLENOL PM) 25-500 MG TABS tablet Take 2 tablets by mouth at bedtime as needed (sleep).    ? docusate sodium (COLACE) 100 MG capsule Take 1 capsule (100 mg total) by mouth 2 (two) times daily. 10 capsule 0  ? doxycycline (VIBRAMYCIN) 50 MG capsule Take 2 capsules (100 mg total) by mouth 2 (two) times daily for 10 days. 40 capsule 0  ? famotidine (PEPCID) 20 MG tablet Take 1 tablet (20 mg total) by mouth 2 (two) times daily. 30 tablet 3  ? ipratropium-albuterol (DUONEB) 0.5-2.5 (3) MG/3ML SOLN Take 3 mLs by  nebulization every 6 (six) hours as needed (wheezing/shortness of breath).    ? macitentan (OPSUMIT) 10 MG tablet Take 1 tablet (10 mg total) by mouth daily. 90 tablet 3  ? ondansetron (ZOFRAN-ODT) 4 MG disintegrating tablet Take 1 tablet (4 mg total) by mouth every 6 (six) hours as needed for nausea. 20 tablet 0  ? OXYGEN Inhale 2-8 L into the lungs continuous.    ? potassium chloride SA (KLOR-CON M) 20 MEQ tablet Take 1 tablet (20 mEq total) by mouth 2 (two) times daily. 60 tablet 11  ? Selexipag (UPTRAVI) 800 MCG TABS Take 1 tablet (800 mcg total) by mouth 2 (two) times daily. 60 tablet 11  ? sertraline (ZOLOFT) 25 MG tablet Take 1 tablet (25 mg total) by mouth daily. 30 tablet 2  ? simethicone (MYLICON) 80 MG chewable tablet  Chew 80-160 mg by mouth 3 (three) times daily as needed (gas/flatulence).    ? spironolactone (ALDACTONE) 25 MG tablet TAKE 1/2 TABLET(12.5 MG) BY MOUTH DAILY 45 tablet 3  ? tadalafil, PAH, (ADCIRCA) 20 MG tablet Take 20 mg by mouth 2 (two) times daily.    ? torsemide (DEMADEX) 20 MG tablet Take 1 tablet (20 mg total) by mouth every morning AND 3 tablets (60 mg total) every evening. 100 tablet 11  ? ?No current facility-administered medications for this encounter.  ? ? ?Allergies  ?Allergen Reactions  ? Adhesive [Tape] Hives  ?  Tolerates paper tape  ? Succinylcholine Other (See Comments)  ?  Have trouble waking up  ? Tizanidine Hives  ? ? ?  ?Social History  ? ?Socioeconomic History  ? Marital status: Single  ?  Spouse name: Not on file  ? Number of children: Not on file  ? Years of education: Not on file  ? Highest education level: Not on file  ?Occupational History  ? Not on file  ?Tobacco Use  ? Smoking status: Former  ? Smokeless tobacco: Never  ? Tobacco comments:  ?  only smoked for fun in highschool not even one a day  ?Vaping Use  ? Vaping Use: Never used  ?Substance and Sexual Activity  ? Alcohol use: Yes  ?  Comment: occ  ? Drug use: Not Currently  ? Sexual activity: Yes  ?   Partners: Male  ?  Comment: 1st intercourse- 17, partners- 41, current partner- 10 yrs   ?Other Topics Concern  ? Not on file  ?Social History Narrative  ? Not on file  ? ?Social Determinants of Health  ? ?F

## 2022-01-17 ENCOUNTER — Encounter (HOSPITAL_COMMUNITY): Payer: Medicare Other

## 2022-01-20 ENCOUNTER — Ambulatory Visit: Payer: Medicare Other | Admitting: Obstetrics & Gynecology

## 2022-01-20 NOTE — Telephone Encounter (Signed)
Advanced Heart Failure Patient Advocate Encounter ? ?Called and spoke with [facility]. Opsumit was shipped and delivered to patient on 03/14. Representative stated that the patient could call next week to order both Uptravi and Opsumit. Called and confirmed with the patient that it was delivered. Relayed refill information as well. ? ?Tadalafil was filled in Feb for a 90 day supply. ? ?[name], CPhT ? ?

## 2022-01-23 ENCOUNTER — Telehealth: Payer: Self-pay | Admitting: Surgical

## 2022-01-23 NOTE — Telephone Encounter (Signed)
Patient sent MyChart message about discomfort, requested appointment to be seen to have sutures removed.  Called patient this a.m. at 9 AM and informed her that I could see her today/this a.m. until 11:30 AM.  She reported she would have to take off work, she was going to see if that was possible and give [place] a call back.  I discussed with her if it was not possible to call our office and we can schedule her for an appointment later this week.  Patient was understanding and appreciative. ?

## 2022-01-27 ENCOUNTER — Other Ambulatory Visit: Payer: Self-pay

## 2022-01-27 ENCOUNTER — Ambulatory Visit (INDEPENDENT_AMBULATORY_CARE_PROVIDER_SITE_OTHER): Payer: Medicare Other | Admitting: Surgical

## 2022-01-27 DIAGNOSIS — N62 Hypertrophy of breast: Secondary | ICD-10-CM

## 2022-01-27 DIAGNOSIS — M546 Pain in thoracic spine: Secondary | ICD-10-CM

## 2022-01-27 DIAGNOSIS — G8929 Other chronic pain: Secondary | ICD-10-CM

## 2022-01-27 NOTE — Progress Notes (Signed)
56 year old female here for follow-up after bilateral breast reduction via free nipple graft with Dr. Erin Hearing on 12/20/2021.  She is approximately 5 weeks postop.  She is here for evaluation of tenderness of bilateral lateral breasts, reports she has noticed some sutures protruding through the incisions.  She is otherwise doing well. ? ?Chaperone present on exam ?On exam bilateral NAC grafts are healing well, she has some scabbing centrally of bilateral nipples, otherwise she is beginning to pigment and epithelialized nicely.  I do not appreciate any subcutaneous fluid collections with palpation.  No foul odor is noted.  She does have a wound of the left lateral breast that is approximately 3 x 36 mm.  She also has a rash within the folds of the left lateral breast.  There is no signs of infection on exam. ? ?Recommend continue with Vaseline and gauze dressing changes to bilateral nipple areolar grafts.  Recommend Vaseline to left lateral breast incisional wound.  Continue with compressive garments.  Avoid strenuous activities.  Recommend following up in a few weeks for reevaluation. ?

## 2022-02-03 ENCOUNTER — Encounter (HOSPITAL_COMMUNITY): Payer: Medicare Other

## 2022-02-03 NOTE — Progress Notes (Incomplete)
?  ? ? ?ID:  Dawn Fowler, DOB [birth year], MRN [id number]   ?Provider location: [facility] ?Type of Visit: Established patient  ? ?PCP:  Hayden Rasmussen, MD  ?Cardiologist:  Dr. Aundra Dubin ?  ?History of Present Illness: ?Dawn Fowler is a 56 y.o. 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  She is on CPAP at home and 6L home oxygen (says she has been on oxygen for "years.").  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  She additionally has significant HTN.  She had been on Opsumit 10 + tadalafil 20 mg daily but ran out of this prior to her 2/21 admission. She also has a history of prior DVT after long train ride, has had 2 negative V/Q scans.  She was hospitalized for COVID-19 at [facility] in 1/21.  ?  ?She was seen by [facility] after discharge from [place] admission and was noted to be volume overloaded, Lasix was changed to torsemide but she was unable to get to pharmacy to pick up. Subsequently, she developed progressive wt gain, nearly 20 lb and increased dyspnea, prompting her to report back to the ED in 2/21, where she was found to be in acute on chronic diastolic HF and readmitted for IV diuresis and AHF consultation. Echo in 2/21 showed LVEF 55-60%, moderate RV dilation/moderate RV dysfunction. [place] 2/21 showed elevated left and right heart filling pressures with severe pulmonary hypertension. PFTs completed showing severe restriction and severely decreased DLCO. High resolution CT was concerning  for small vessel disease, no ILD, and concerning for PAH.  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 She was continued on IV diuretics and had good response, diuresing 32 lb. She was transitioned  to torsemide 40 mg/20 mg. Opsumit and tadalafil restarted.  ? 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  She is on Eliquis. RHC in 9/21 showed normal RA pressure and PCWP, moderate-severe pulmonary arterial hypertension.  ? ?Echo 5/22 showed EF 55% with mildly enlarged RV with mild RV dysfunction, mildly D-shaped septum, PASP 48 with normal IVC.  ? ?In 2/23, she had bilateral breast reduction surgery.  ? ?She presents today for followup of CHF/RV failure and pulmonary hypertension. Post-op breast reduction surgery, she developed significant volume overload. I increased her torsemide at last appointment.  Today, weight is down 5 lbs.  Breathing is better.  She is still short of breath if she walks about 100 feet or up an incline/stairs.  No orthopnea/PND.  No lightheadedness.    ? ?Labs (6/21): K 4.5, creatinine 2.09 ?Labs (8/21): K 4.3, creatinine 2.11 ?Labs (9/21): K 3.9, creatinine 2.0 ?Labs (10/21): BNP 58 ?Labs (12/21): K 3.8, creatinine 2.28 ?Labs (1/22): BNP 160, K 3.7, creatinine 1.79 ?Labs (3/22): BNP 15.7, K 3.9, creatinine 2.33 ?Labs (5/22): K 3.8, creatinine 2.25, BNP 117 ?Labs (8/22): K 3.9, creatinine 2.16, BNP 198 ?Labs (1/23): BNP 276 ?Labs (2/23): K 3.8, creatinine 2.01 ?Labs (3/23): K 4.1, creatinine 2.19 ? ?6 minute walk (9/21): 168 m ?6 minute walk (10/21): 122 m ?6 minute walk (3/22): 107 m ? ?PMH: ?1. OHS/OSA: Uses home oxygen.  ?2. CKD stage IV ?3. COVID-19 PNA in  12/20 ?4. Restrictive lung disease: PFTs (2/21) with severe restriction, severely decreased DLCO.  ?- High resolution CT chest (2/21): no interstitial lung disease, "small vessel disease."  ?- Breast reduction surgery 2/23 ?5. Gout ?6. HTN ?7. DVT: Remote, after train ride.  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  ?8. Pulmonary hypertension: RHC (2/21) with mean RA 14, PA 89/33 mean 54, mean PCWP 34, CI 2.22, PVR 3.87 WU => mixed pulmonary arterial/pulmonary venous hypertension.  ?- V/Q scan  (2/21): No evidence for chronic PE.  ?- Echo (2/21): EF 55-60%, moderately dilated RV with moderately decreased systolic function.  ?- HIV and rheumatologic serologies negative.  ?- RHC (9/21): mean RA 9, PA 68/23 mean 40, mean PCWP 11, PVR 5.6 WU, no step up in oxygen saturation so no evidence for left to right shunting.  ?- Echo (5/22): EF 55% with mildly enlarged RV with mild RV dysfunction, mildly D-shaped septum, PASP 48 with normal IVC. ?9. Depression ? ? ?Current Outpatient Medications  ?Medication Sig Dispense Refill  ? acetaminophen (TYLENOL) 500 MG tablet Take 1,000 mg by mouth every 6 (six) hours as needed for moderate pain or headache.    ? allopurinol (ZYLOPRIM) 100 MG tablet Take 1 tablet (100 mg total) by mouth daily. 30 tablet 2  ? apixaban (ELIQUIS) 5 MG TABS tablet Take 1 tablet (5 mg total) by mouth 2 (two) times daily. 60 tablet 9  ? Calcium Carbonate Antacid (TUMS E-X PO) Take 1-2 tablets by mouth 3 (three) times daily as needed (gas/abdominal pain.).    ? Cholecalciferol (VITAMIN D3) 125 MCG (5000 UT) CAPS Take 5,000 Units by mouth in the morning.    ? colchicine 0.6 MG tablet Take 0.6 mg by mouth daily as needed (gout flare).    ? dapagliflozin propanediol (FARXIGA) 10 MG TABS tablet Take 1 tablet (10 mg total) by mouth daily before breakfast. 30 tablet 11  ? diclofenac Sodium (VOLTAREN) 1 % GEL Apply 2 g topically 3 (three) times daily as needed (pain.).    ? diphenhydramine-acetaminophen (TYLENOL PM) 25-500 MG TABS tablet Take 2 tablets by mouth at bedtime as needed (sleep).    ? docusate sodium (COLACE) 100 MG capsule Take 1 capsule (100 mg total) by mouth 2 (two) times daily. 10 capsule 0  ? famotidine (PEPCID) 20 MG tablet Take 1 tablet (20 mg total) by mouth 2 (two) times daily. 30 tablet 3  ? ipratropium-albuterol (DUONEB) 0.5-2.5 (3) MG/3ML SOLN Take 3 mLs by nebulization every 6 (six) hours as needed (wheezing/shortness of breath).    ? macitentan (OPSUMIT) 10 MG tablet Take 1 tablet  (10 mg total) by mouth daily. 90 tablet 3  ? ondansetron (ZOFRAN-ODT) 4 MG disintegrating tablet Take 1 tablet (4 mg total) by mouth every 6 (six) hours as needed for nausea. 20 tablet 0  ? OXYGEN Inhale 2-8 L into the lungs continuous.    ? potassium chloride SA (KLOR-CON M) 20 MEQ tablet Take 1 tablet (20 mEq total) by mouth 2 (two) times daily. 60 tablet 11  ? Selexipag (UPTRAVI) 800 MCG TABS Take 1 tablet (800 mcg total) by mouth 2 (two) times daily. 60 tablet 11  ? sertraline (ZOLOFT) 25 MG tablet Take 1 tablet (25 mg total) by mouth daily. 30 tablet 2  ? simethicone (MYLICON) 80 MG chewable tablet Chew 80-160 mg by mouth 3 (three) times daily as needed (gas/flatulence).    ? spironolactone (ALDACTONE) 25 MG tablet TAKE 1/2 TABLET(12.5 MG)  BY MOUTH DAILY 45 tablet 3  ? tadalafil, PAH, (ADCIRCA) 20 MG tablet Take 20 mg by mouth 2 (two) times daily.    ? torsemide (DEMADEX) 20 MG tablet Take 1 tablet (20 mg total) by mouth every morning AND 3 tablets (60 mg total) every evening. 100 tablet 11  ? ?No current facility-administered medications for this visit.  ? ? ?Allergies  ?Allergen Reactions  ? Adhesive [Tape] Hives  ?  Tolerates paper tape  ? Succinylcholine Other (See Comments)  ?  Have trouble waking up  ? Tizanidine Hives  ? ? ?  ?Social History  ? ?Socioeconomic History  ? Marital status: Single  ?  Spouse name: Not on file  ? Number of children: Not on file  ? Years of education: Not on file  ? Highest education level: Not on file  ?Occupational History  ? Not on file  ?Tobacco Use  ? Smoking status: Former  ? Smokeless tobacco: Never  ? Tobacco comments:  ?  only smoked for fun in highschool not even one a day  ?Vaping Use  ? Vaping Use: Never used  ?Substance and Sexual Activity  ? Alcohol use: Yes  ?  Comment: occ  ? Drug use: Not Currently  ? Sexual activity: Yes  ?  Partners: Male  ?  Comment: 1st intercourse- 31, partners- 27, current partner- 10 yrs   ?Other Topics Concern  ? Not on file  ?Social  History Narrative  ? Not on file  ? ?Social Determinants of Health  ? ?Financial Resource Strain: Not on file  ?Food Insecurity: Not on file  ?Transportation Needs: Not on file  ?Physical Activity: Not on

## 2022-02-07 ENCOUNTER — Encounter (HOSPITAL_COMMUNITY): Payer: Self-pay | Admitting: Cardiology

## 2022-02-08 ENCOUNTER — Other Ambulatory Visit (HOSPITAL_COMMUNITY): Payer: Self-pay | Admitting: Cardiology

## 2022-02-08 ENCOUNTER — Encounter (HOSPITAL_COMMUNITY): Payer: Self-pay | Admitting: *Deleted

## 2022-02-10 ENCOUNTER — Ambulatory Visit: Payer: Medicare Other | Admitting: Plastic Surgery

## 2022-02-13 ENCOUNTER — Ambulatory Visit (INDEPENDENT_AMBULATORY_CARE_PROVIDER_SITE_OTHER): Payer: Medicare Other | Admitting: Plastic Surgery

## 2022-02-13 DIAGNOSIS — N62 Hypertrophy of breast: Secondary | ICD-10-CM

## 2022-02-13 NOTE — Progress Notes (Signed)
Status post bilateral breast reduction on 12/20/2021.  She was concerned that she had some drainage from her left nipple. ? ?Physical exam ?Bilateral breast reasonably symmetric, scant fibrous exudate from the left nipple, both nipples are healing well or free nipple graft.  Some fascia necrosis palpable on physical exam left greater than right. ? ?Assessment and plan ?Patient is healing well after a very large bilateral breast reduction with free nipple graft.  We will continue to follow her and check her again in several weeks. ?

## 2022-02-15 ENCOUNTER — Encounter (HOSPITAL_COMMUNITY): Payer: Medicare Other | Admitting: Cardiology

## 2022-02-17 ENCOUNTER — Ambulatory Visit (HOSPITAL_COMMUNITY)
Admission: RE | Admit: 2022-02-17 | Discharge: 2022-02-17 | Disposition: A | Discharge status: Home / Self Care | Payer: Medicare Other | Source: Ambulatory Visit | Attending: Family Medicine | Admitting: Family Medicine

## 2022-02-17 ENCOUNTER — Encounter (HOSPITAL_COMMUNITY): Payer: Self-pay

## 2022-02-17 VITALS — BP 140/80 | HR 100 | Wt 247.4 lb

## 2022-02-17 DIAGNOSIS — J984 Other disorders of lung: Secondary | ICD-10-CM | POA: Diagnosis not present

## 2022-02-17 DIAGNOSIS — I50810 Right heart failure, unspecified: Secondary | ICD-10-CM | POA: Diagnosis not present

## 2022-02-17 DIAGNOSIS — I5032 Chronic diastolic (congestive) heart failure: Secondary | ICD-10-CM

## 2022-02-17 DIAGNOSIS — Z6841 Body Mass Index (BMI) 40.0 and over, adult: Secondary | ICD-10-CM | POA: Diagnosis not present

## 2022-02-17 DIAGNOSIS — I2721 Secondary pulmonary arterial hypertension: Secondary | ICD-10-CM | POA: Diagnosis not present

## 2022-02-17 DIAGNOSIS — G4733 Obstructive sleep apnea (adult) (pediatric): Secondary | ICD-10-CM | POA: Diagnosis not present

## 2022-02-17 DIAGNOSIS — I5033 Acute on chronic diastolic (congestive) heart failure: Secondary | ICD-10-CM | POA: Insufficient documentation

## 2022-02-17 DIAGNOSIS — Z86718 Personal history of other venous thrombosis and embolism: Secondary | ICD-10-CM

## 2022-02-17 DIAGNOSIS — J9611 Chronic respiratory failure with hypoxia: Secondary | ICD-10-CM

## 2022-02-17 DIAGNOSIS — I272 Pulmonary hypertension, unspecified: Secondary | ICD-10-CM

## 2022-02-17 DIAGNOSIS — Z79899 Other long term (current) drug therapy: Secondary | ICD-10-CM | POA: Diagnosis not present

## 2022-02-17 DIAGNOSIS — Z7901 Long term (current) use of anticoagulants: Secondary | ICD-10-CM | POA: Insufficient documentation

## 2022-02-17 DIAGNOSIS — F32A Depression, unspecified: Secondary | ICD-10-CM | POA: Diagnosis not present

## 2022-02-17 DIAGNOSIS — N183 Chronic kidney disease, stage 3 unspecified: Secondary | ICD-10-CM

## 2022-02-17 DIAGNOSIS — I10 Essential (primary) hypertension: Secondary | ICD-10-CM

## 2022-02-17 DIAGNOSIS — I130 Hypertensive heart and chronic kidney disease with heart failure and stage 1 through stage 4 chronic kidney disease, or unspecified chronic kidney disease: Secondary | ICD-10-CM | POA: Insufficient documentation

## 2022-02-17 LAB — BASIC METABOLIC PANEL
Anion gap: 8 (ref 5–15)
BUN: 31 mg/dL — ABNORMAL HIGH (ref 6–20)
CO2: 25 mmol/L (ref 22–32)
Calcium: 9.5 mg/dL (ref 8.9–10.3)
Chloride: 106 mmol/L (ref 98–111)
Creatinine, Ser: 1.98 mg/dL — ABNORMAL HIGH (ref 0.44–1.00)
GFR, Estimated: 29 mL/min — ABNORMAL LOW (ref >60)
Glucose, Bld: 107 mg/dL — ABNORMAL HIGH (ref 70–99)
Potassium: 4.1 mmol/L (ref 3.5–5.1)
Sodium: 139 mmol/L (ref 135–145)

## 2022-02-17 LAB — BRAIN NATRIURETIC PEPTIDE: B Natriuretic Peptide: 279.4 pg/mL — ABNORMAL HIGH (ref 0.0–100.0)

## 2022-02-17 MED ORDER — SERTRALINE HCL 50 MG PO TABS: 50.0000 mg | ORAL_TABLET | Freq: Every day | ORAL | 2 refills | Status: DC

## 2022-02-17 MED ORDER — TORSEMIDE 20 MG PO TABS: ORAL_TABLET | ORAL | 11 refills | Status: DC

## 2022-02-17 MED ORDER — TORSEMIDE 20 MG PO TABS: 40.0000 mg | ORAL_TABLET | Freq: Two times a day (BID) | ORAL | 2 refills | Status: DC

## 2022-02-17 NOTE — Patient Instructions (Signed)
Labs done today. We will contact you only if your labs are abnormal. ? ?INCREASE Zoloft to '50mg'$  (1 tablet) by mouth daily. ? ?INCREASE Torsemide to '40mg'$  (2 tablets) by mouth every morning and '60mg'$  (3 tablets) by mouth every evening for 3 days THEN DECREASE to '40mg'$  (2 tablets) by mouth 2 times daily.  ? ?No other medication changes were made. Please continue all current medications as prescribed. ? ?[facility] to [facility] They will contact you to schedule an apointment.  ? ?Your physician recommends that you schedule a follow-up appointment in: 10-14 days for a lab only appointment, 3-4 weeks with our NP/[facility] here in our office and in 3-4 months with Dr. Aundra Dubin. ? ?If you have any questions or concerns before your next appointment please send [place] a message through [place] or call our office at [phone].   ? ?TO LEAVE A MESSAGE FOR THE NURSE SELECT OPTION 2, PLEASE LEAVE A MESSAGE INCLUDING: ?YOUR NAME ?DATE OF BIRTH ?CALL BACK NUMBER ?REASON FOR CALL**this is important as we prioritize the call backs ? ?YOU WILL RECEIVE A CALL BACK THE SAME DAY AS LONG AS YOU CALL BEFORE 4:00 PM ? ? ?Do the following things EVERYDAY: ?Weigh yourself in the morning before breakfast. Write it down and keep it in a log. ?Take your medicines as prescribed ?Eat low salt foods--Limit salt (sodium) to 2000 mg per day.  ?Stay as active as you can everyday ?Limit all fluids for the day to less than 2 liters ? ? ?At the [facility], you and your health needs are our priority. As part of our continuing mission to provide you with exceptional heart care, we have created designated Provider Care Teams. These Care Teams include your primary Cardiologist (physician) and Advanced Practice Providers (APPs- Physician Assistants and Nurse Practitioners) who all work together to provide you with the care you need, when you need it.  ? ?You may see any of the following providers on your designated  Care Team at your next follow up: ?Dr Glori Bickers ?Dr [name] ?Darrick Grinder, NP ?Lyda Jester, PA ?Audry Riles, PharmD ? ? ?Please be sure to bring in all your medications bottles to every appointment.  ? ?

## 2022-02-17 NOTE — Progress Notes (Addendum)
?  ? ? ?ID:  Dawn Fowler, DOB May 29, 1966, MRN [id number]   ?Provider location: [facility] ?Type of Visit: Established patient  ? ?PCP:  Hayden Rasmussen, MD  ?Cardiologist:  Dr. Aundra Dubin ?  ?History of Present Illness: ?Dawn Fowler is a 56 y.o. 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  She is on CPAP at home and 6L home oxygen (says she has been on oxygen for "years.").  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  She additionally has significant HTN.  She had been on Opsumit 10 + tadalafil 20 mg daily but ran out of this prior to her 2/21 admission. She also has a history of prior DVT after long train ride, has had 2 negative V/Q scans.  She was hospitalized for COVID-19 at [facility] in 1/21.  ?  ?She was seen by [facility] after discharge from [place] admission and was noted to be volume overloaded, Lasix was changed to torsemide but she was unable to get to pharmacy to pick up. Subsequently, she developed progressive wt gain, nearly 20 lb and increased dyspnea, prompting her to report back to the ED in 2/21, where she was found to be in acute on chronic diastolic HF and readmitted for IV diuresis and AHF consultation. Echo in 2/21 showed LVEF 55-60%, moderate RV dilation/moderate RV dysfunction. [place] 2/21 showed elevated left and right heart filling pressures with severe pulmonary hypertension. PFTs completed showing severe restriction and severely decreased DLCO. High resolution CT was concerning  for small vessel disease, no ILD, and concerning for PAH.  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 She was continued on IV diuretics and had good response, diuresing 32 lb. She was transitioned  to torsemide 40 mg/20 mg. Opsumit and tadalafil restarted.  ? 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  She is on Eliquis. RHC in 9/21 showed normal RA pressure and PCWP, moderate-severe pulmonary arterial hypertension.  ? ?Echo 5/22 showed EF 55% with mildly enlarged RV with mild RV dysfunction, mildly D-shaped septum, PASP 48 with normal IVC.  ? ?In 2/23, she had bilateral breast reduction surgery. She developed significant volume overload and her torsemide was increased. ? ?Follow up 3/23 she was volume overloaded and torsemide increased.  ? ?Today she returns for HF follow up with her sister. Overall feeling more SOB after walking about 100 feet. Feels "arteries tight" with activity. Remains on 4 L oxygen. Noticed swelling is some better. Denies palpitations, abnormal bleeding, dizziness, edema, or PND/Orthopnea. Appetite ok. No fever or chills. Weight at home 245 pounds. Not consistently taking torsemide evening dosing, says she is up all night urinating and having incontinence episodes. Eats lunch meat sandwiches and packaged chickens salads. Tries not to eat out more than once a week. ? ?Labs (6/21): K 4.5, creatinine 2.09 ?Labs (8/21): K 4.3, creatinine 2.11 ?Labs (9/21): K 3.9, creatinine 2.0 ?Labs (10/21): BNP 58 ?Labs (12/21): K 3.8, creatinine 2.28 ?Labs (1/22): BNP 160, K 3.7, creatinine 1.79 ?Labs (3/22): BNP 15.7, K 3.9, creatinine 2.33 ?Labs (5/22): K 3.8, creatinine 2.25, BNP 117 ?Labs (8/22): K 3.9, creatinine 2.16, BNP 198 ?Labs (1/23): BNP 276 ?Labs (2/23):  K 3.8, creatinine 2.01 ?Labs (3/23): K 4.1, creatinine 2.19 ? ?6 minute walk (9/21): 168 m ?6 minute walk (10/21): 122 m ?6 minute walk (3/22): 107 m ? ?PMH: ?1. OHS/OSA: Uses home oxygen.  ?2. CKD stage IV ?3. COVID-19 PNA in 12/20 ?4. Restrictive lung disease: PFTs (2/21) with severe restriction, severely decreased DLCO.  ?- High resolution CT chest (2/21): no interstitial lung disease, "small vessel disease."  ?-  Breast reduction surgery 2/23 ?5. Gout ?6. HTN ?7. DVT: Remote, after train ride.  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  ?8. Pulmonary hypertension: RHC (2/21) with mean RA 14, PA 89/33 mean 54, mean PCWP 34, CI 2.22, PVR 3.87 WU => mixed pulmonary arterial/pulmonary venous hypertension.  ?- V/Q scan (2/21): No evidence for chronic PE.  ?- Echo (2/21): EF 55-60%, moderately dilated RV with moderately decreased systolic function.  ?- HIV and rheumatologic serologies negative.  ?- RHC (9/21): mean RA 9, PA 68/23 mean 40, mean PCWP 11, PVR 5.6 WU, no step up in oxygen saturation so no evidence for left to right shunting.  ?- Echo (5/22): EF 55% with mildly enlarged RV with mild RV dysfunction, mildly D-shaped septum, PASP 48 with normal IVC. ?9. Depression ? ? ?Current Outpatient Medications  ?Medication Sig Dispense Refill  ? acetaminophen (TYLENOL) 500 MG tablet Take 1,000 mg by mouth every 6 (six) hours as needed for moderate pain or headache.    ? allopurinol (ZYLOPRIM) 100 MG tablet Take 1 tablet (100 mg total) by mouth daily. 30 tablet 2  ? apixaban (ELIQUIS) 5 MG TABS tablet Take 1 tablet (5 mg total) by mouth 2 (two) times daily. 60 tablet 9  ? Calcium Carbonate Antacid (TUMS E-X PO) Take 1-2 tablets by mouth 3 (three) times daily as needed (gas/abdominal pain.).    ? Cholecalciferol (VITAMIN D3) 125 MCG (5000 UT) CAPS Take 5,000 Units by mouth in the morning.    ? colchicine 0.6 MG tablet Take 0.6 mg by mouth daily as needed (gout flare).    ? dapagliflozin propanediol (FARXIGA) 10 MG TABS tablet Take 1 tablet (10 mg total) by mouth daily before breakfast. 30 tablet 11  ? diclofenac Sodium (VOLTAREN) 1 % GEL Apply 2 g topically 3 (three) times daily as needed (pain.).    ? diphenhydramine-acetaminophen (TYLENOL PM) 25-500 MG TABS tablet Take 2 tablets by mouth at bedtime as needed (sleep).    ? docusate sodium (COLACE) 100 MG capsule Take 1 capsule (100 mg total) by mouth 2 (two) times daily.  10 capsule 0  ? famotidine (PEPCID) 20 MG tablet Take 1 tablet (20 mg total) by mouth 2 (two) times daily. 30 tablet 3  ? ipratropium-albuterol (DUONEB) 0.5-2.5 (3) MG/3ML SOLN Take 3 mLs by nebulization every 6 (six) hours as needed (wheezing/shortness of breath).    ? macitentan (OPSUMIT) 10 MG tablet Take 1 tablet (10 mg total) by mouth daily. 90 tablet 3  ? ondansetron (ZOFRAN-ODT) 4 MG disintegrating tablet Take 1 tablet (4 mg total) by mouth every 6 (six) hours as needed for nausea. 20 tablet 0  ? OXYGEN Inhale 2-8 L into the lungs continuous.    ? potassium chloride SA (KLOR-CON M) 20 MEQ tablet Take 1 tablet (20 mEq total) by mouth 2 (two) times daily. 60 tablet 11  ? sertraline (ZOLOFT) 25 MG tablet Take 1 tablet (25 mg total) by mouth daily. 30 tablet 2  ? spironolactone (ALDACTONE) 25 MG tablet TAKE 1/2 TABLET(12.5  MG) BY MOUTH DAILY 45 tablet 3  ? tadalafil, PAH, (ADCIRCA) 20 MG tablet Take 20 mg by mouth 2 (two) times daily.    ? torsemide (DEMADEX) 20 MG tablet Take 20 mg by mouth in the morning. 2 tablets in the PM    ? UPTRAVI 800 MCG TABS Take 1 tablet twice daily. 60 tablet 11  ? ?No current facility-administered medications for this encounter.  ? ? ?Allergies  ?Allergen Reactions  ? Adhesive [Tape] Hives  ?  Tolerates paper tape  ? Succinylcholine Other (See Comments)  ?  Have trouble waking up  ? Tizanidine Hives  ? ? ?  ?Social History  ? ?Socioeconomic History  ? Marital status: Single  ?  Spouse name: Not on file  ? Number of children: Not on file  ? Years of education: Not on file  ? Highest education level: Not on file  ?Occupational History  ? Not on file  ?Tobacco Use  ? Smoking status: Former  ? Smokeless tobacco: Never  ? Tobacco comments:  ?  only smoked for fun in highschool not even one a day  ?Vaping Use  ? Vaping Use: Never used  ?Substance and Sexual Activity  ? Alcohol use: Yes  ?  Comment: occ  ? Drug use: Not Currently  ? Sexual activity: Yes  ?  Partners: Male  ?  Comment: 1st  intercourse- 41, partners- 44, current partner- 10 yrs   ?Other Topics Concern  ? Not on file  ?Social History Narrative  ? Not on file  ? ?Social Determinants of Health  ? ?Financial Resource Strain:

## 2022-02-24 ENCOUNTER — Ambulatory Visit: Payer: Medicare Other | Admitting: Plastic Surgery

## 2022-03-03 ENCOUNTER — Ambulatory Visit (INDEPENDENT_AMBULATORY_CARE_PROVIDER_SITE_OTHER): Payer: Medicare Other | Admitting: Obstetrics & Gynecology

## 2022-03-03 ENCOUNTER — Encounter: Payer: Self-pay | Admitting: Plastic Surgery

## 2022-03-03 ENCOUNTER — Encounter: Payer: Self-pay | Admitting: Obstetrics & Gynecology

## 2022-03-03 ENCOUNTER — Other Ambulatory Visit (HOSPITAL_COMMUNITY)
Admission: RE | Admit: 2022-03-03 | Discharge: 2022-03-03 | Disposition: A | Discharge status: Home / Self Care | Payer: Medicare Other | Source: Ambulatory Visit | Attending: Obstetrics & Gynecology | Admitting: Obstetrics & Gynecology

## 2022-03-03 ENCOUNTER — Ambulatory Visit (INDEPENDENT_AMBULATORY_CARE_PROVIDER_SITE_OTHER): Payer: Medicare Other | Admitting: Plastic Surgery

## 2022-03-03 ENCOUNTER — Ambulatory Visit (HOSPITAL_COMMUNITY)
Admission: RE | Admit: 2022-03-03 | Discharge: 2022-03-03 | Disposition: A | Discharge status: Home / Self Care | Payer: Medicare Other | Source: Ambulatory Visit | Attending: Internal Medicine | Admitting: Internal Medicine

## 2022-03-03 VITALS — BP 120/74 | HR 104 | Resp 16 | Ht 60.5 in | Wt 245.0 lb

## 2022-03-03 DIAGNOSIS — N62 Hypertrophy of breast: Secondary | ICD-10-CM

## 2022-03-03 DIAGNOSIS — I5032 Chronic diastolic (congestive) heart failure: Secondary | ICD-10-CM

## 2022-03-03 DIAGNOSIS — Z780 Asymptomatic menopausal state: Secondary | ICD-10-CM | POA: Diagnosis not present

## 2022-03-03 DIAGNOSIS — Z6841 Body Mass Index (BMI) 40.0 and over, adult: Secondary | ICD-10-CM

## 2022-03-03 DIAGNOSIS — Z124 Encounter for screening for malignant neoplasm of cervix: Secondary | ICD-10-CM

## 2022-03-03 DIAGNOSIS — Z01419 Encounter for gynecological examination (general) (routine) without abnormal findings: Secondary | ICD-10-CM

## 2022-03-03 DIAGNOSIS — Z9189 Other specified personal risk factors, not elsewhere classified: Secondary | ICD-10-CM | POA: Diagnosis not present

## 2022-03-03 DIAGNOSIS — Z9289 Personal history of other medical treatment: Secondary | ICD-10-CM

## 2022-03-03 LAB — BASIC METABOLIC PANEL
Anion gap: 8 (ref 5–15)
BUN: 34 mg/dL — ABNORMAL HIGH (ref 6–20)
CO2: 24 mmol/L (ref 22–32)
Calcium: 9.3 mg/dL (ref 8.9–10.3)
Chloride: 107 mmol/L (ref 98–111)
Creatinine, Ser: 1.9 mg/dL — ABNORMAL HIGH (ref 0.44–1.00)
GFR, Estimated: 31 mL/min — ABNORMAL LOW (ref >60)
Glucose, Bld: 102 mg/dL — ABNORMAL HIGH (ref 70–99)
Potassium: 4 mmol/L (ref 3.5–5.1)
Sodium: 139 mmol/L (ref 135–145)

## 2022-03-03 NOTE — Progress Notes (Signed)
? ? ?Dawn Fowler 03/05/1966 [id number] ? ? ?History:    56 y.o. G41P1L1 Son by C-section.  Stable boyfriend x > 10 yrs ?  ?RP:  Established patient presenting for Annual Gynecologic Exam ?  ?HPI: Postmenopausal x5 years.  No PMB x removal of Polyp.  HSC/Myosure excision/D+C on 04/08/2020.  Patho Benign endometrial Polyp and Endometrium.  No pelvic pain.  Sexually active with her boyfriend.  No pain with intercourse.  No abnormal vaginal discharge. PAP Neg 02-13-20.  Pap reflex today.  Breasts S/P recent bilateral breast reduction in 11/2021.  Left Breast still inflamed.  Seen by her Breast surgeon yesterday who felt it was healing wnl.  MMG Neg 04-05-21.  Urine and bowel movements normal.  Body mass index improved to 47.06.  COLONOSCOPY: 03-12-20 ?  ?Past medical history,surgical history, family history and social history were all reviewed and documented in the EPIC chart. ? ?Gynecologic History ?No LMP recorded (lmp unknown). Patient is postmenopausal. ? ?Obstetric History ?OB History  ?Gravida Para Term Preterm AB Living  ?'1 1       1  '$ ?SAB IAB Ectopic Multiple Live Births  ?           ?  ?# Outcome Date GA Lbr Len/2nd Weight Sex Delivery Anes PTL Lv  ?1 Para           ? ? ? ?ROS: A ROS was performed and pertinent positives and negatives are included in the history. ?GENERAL: No fevers or chills. HEENT: No change in vision, no earache, sore throat or sinus congestion. NECK: No pain or stiffness. CARDIOVASCULAR: No chest pain or pressure. No palpitations. PULMONARY: No shortness of breath, cough or wheeze. GASTROINTESTINAL: No abdominal pain, nausea, vomiting or diarrhea, melena or bright red blood per rectum. GENITOURINARY: No urinary frequency, urgency, hesitancy or dysuria. MUSCULOSKELETAL: No joint or muscle pain, no back pain, no recent trauma. DERMATOLOGIC: No rash, no itching, no lesions. ENDOCRINE: No polyuria, polydipsia, no heat or cold intolerance. No recent change in weight. HEMATOLOGICAL: No anemia or easy  bruising or bleeding. NEUROLOGIC: No headache, seizures, numbness, tingling or weakness. PSYCHIATRIC: No depression, no loss of interest in normal activity or change in sleep pattern.  ?  ? ?Exam: ? ? ?BP 120/74   Pulse (!) 104   Resp 16   Ht 5' 0.5" (1.537 m)   Wt 245 lb (111.1 kg)   LMP  (LMP Unknown) Comment: pmb last 2-3 months  SpO2 98%   BMI 47.06 kg/m?  ? ?Body mass index is 47.06 kg/m?. ? ?General appearance : Well developed well nourished female. No acute distress ?HEENT: Eyes: no retinal hemorrhage or exudates,  Neck supple, trachea midline, no carotid bruits, no thyroidmegaly ?Lungs: Clear to auscultation, no rhonchi or wheezes, or rib retractions  ?Heart: Regular rate and rhythm, no murmurs or gallops ?Breast:Examined in sitting and supine position were symmetrical in appearance, no palpable masses or tenderness,  no skin retraction, no nipple inversion, no nipple discharge, no skin discoloration, no axillary or supraclavicular lymphadenopathy ?Abdomen: no palpable masses or tenderness, no rebound or guarding ?Extremities: no edema or skin discoloration or tenderness ? ?Pelvic: Vulva: Normal ?            Vagina: No gross lesions or discharge ? Cervix: No gross lesions or discharge ? Uterus  AV, normal size, shape and consistency, non-tender and mobile ? Adnexa  Without masses or tenderness ? Anus: Normal ? ? ?Assessment/Plan:  56 y.o. female for annual exam  ? ?  1. Encounter for routine gynecological examination with Papanicolaou smear of cervix ?Postmenopausal x5 years.  No PMB x removal of Polyp.  HSC/Myosure excision/D+C on 04/08/2020.  Patho Benign endometrial Polyp and Endometrium.  No pelvic pain.  Sexually active with her boyfriend.  No pain with intercourse.  No abnormal vaginal discharge. PAP Neg 02-13-20.  Pap reflex today.  Breasts S/P recent bilateral breast reduction in 11/2021.  Left Breast still inflamed.  Seen by her Breast surgeon yesterday who felt it was healing wnl.  MMG Neg  04-05-21.  Urine and bowel movements normal.  Body mass index improved to 47.06.  COLONOSCOPY: 03-12-20 ?- Cytology - PAP( [place]) ? ?2. Postmenopause ?Postmenopausal x5 years.  No PMB x removal of Polyp.  HSC/Myosure excision/D+C on 04/08/2020.  Patho Benign endometrial Polyp and Endometrium.  No pelvic pain. ? ?3. Class 3 severe obesity due to excess calories with serious comorbidity and body mass index (BMI) of 45.0 to 49.9 in adult [facility]) ?Low calorie/carb diet.  Physical activities on a regular basis. ? ?4. Other specified personal risk factors, not elsewhere classified ? ?5. Personal history of other medical treatment  ? ?Princess Bruins MD, 12:16 PM 03/03/2022 ? ?  ?

## 2022-03-03 NOTE — Progress Notes (Signed)
Status post bilateral breast reduction with free nipple graft on 12/20/2021.  Patient is doing well.  She is happy with her initial result.  She noticed some rashes from tape and we have advised her to stop using tape. ? ?Physical exam ?Satisfactory initial shape with nipple graft now healed but do have some small areas of depigmentation.  Some fat necrosis on bilateral breast left greater than right. ? ?Assessment plan ?Incisions clean dry and intact we will see the patient back in 4 to 6 months for recheck. ?

## 2022-03-03 NOTE — Addendum Note (Signed)
Encounter addended by: Scarlette Calico, RN on: 03/03/2022 8:38 AM ? Actions taken: Order list changed, Diagnosis association updated

## 2022-03-07 ENCOUNTER — Other Ambulatory Visit: Payer: Self-pay | Admitting: Plastic Surgery

## 2022-03-07 LAB — CYTOLOGY - PAP: Diagnosis: NEGATIVE

## 2022-03-17 ENCOUNTER — Encounter (HOSPITAL_COMMUNITY): Payer: Self-pay

## 2022-03-17 ENCOUNTER — Ambulatory Visit (HOSPITAL_COMMUNITY)
Admission: RE | Admit: 2022-03-17 | Discharge: 2022-03-17 | Disposition: A | Discharge status: Home / Self Care | Payer: Medicare Other | Source: Ambulatory Visit | Attending: Family Medicine | Admitting: Family Medicine

## 2022-03-17 VITALS — BP 130/92 | HR 93 | Wt 230.0 lb

## 2022-03-17 DIAGNOSIS — J984 Other disorders of lung: Secondary | ICD-10-CM | POA: Insufficient documentation

## 2022-03-17 DIAGNOSIS — Z86718 Personal history of other venous thrombosis and embolism: Secondary | ICD-10-CM | POA: Diagnosis not present

## 2022-03-17 DIAGNOSIS — I130 Hypertensive heart and chronic kidney disease with heart failure and stage 1 through stage 4 chronic kidney disease, or unspecified chronic kidney disease: Secondary | ICD-10-CM | POA: Insufficient documentation

## 2022-03-17 DIAGNOSIS — E669 Obesity, unspecified: Secondary | ICD-10-CM | POA: Insufficient documentation

## 2022-03-17 DIAGNOSIS — I272 Pulmonary hypertension, unspecified: Secondary | ICD-10-CM

## 2022-03-17 DIAGNOSIS — N183 Chronic kidney disease, stage 3 unspecified: Secondary | ICD-10-CM | POA: Diagnosis not present

## 2022-03-17 DIAGNOSIS — I10 Essential (primary) hypertension: Secondary | ICD-10-CM

## 2022-03-17 DIAGNOSIS — Z6841 Body Mass Index (BMI) 40.0 and over, adult: Secondary | ICD-10-CM | POA: Diagnosis not present

## 2022-03-17 DIAGNOSIS — Z79899 Other long term (current) drug therapy: Secondary | ICD-10-CM | POA: Insufficient documentation

## 2022-03-17 DIAGNOSIS — I5032 Chronic diastolic (congestive) heart failure: Secondary | ICD-10-CM

## 2022-03-17 DIAGNOSIS — F32A Depression, unspecified: Secondary | ICD-10-CM | POA: Insufficient documentation

## 2022-03-17 DIAGNOSIS — Z8249 Family history of ischemic heart disease and other diseases of the circulatory system: Secondary | ICD-10-CM | POA: Diagnosis not present

## 2022-03-17 DIAGNOSIS — Z7901 Long term (current) use of anticoagulants: Secondary | ICD-10-CM | POA: Diagnosis not present

## 2022-03-17 DIAGNOSIS — G4733 Obstructive sleep apnea (adult) (pediatric): Secondary | ICD-10-CM | POA: Insufficient documentation

## 2022-03-17 NOTE — Progress Notes (Signed)
ReDS Vest / Clip - 03/17/22 1100       ReDS Vest / Clip   Station Marker B    Ruler Value 35    ReDS Value Range High volume overload    ReDS Actual Value 41    Anatomical Comments sitting

## 2022-03-17 NOTE — Progress Notes (Signed)
ID:  Dawn Fowler, DOB 09/29/1966, MRN [id number]   Provider location: [facility] Type of Visit: Established patient   PCP:  Hayden Rasmussen, MD  Cardiologist:  Dr. Aundra Dubin   History of Present Illness: Dawn Fowler is a 56 y.o. 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  She is on CPAP at home and 6L home oxygen (says she has been on oxygen for "years.").  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  She additionally has significant HTN.  She had been on Opsumit 10 + tadalafil 20 mg daily but ran out of this prior to her 2/21 admission. She also has a history of prior DVT after long train ride, has had 2 negative V/Q scans.  She was hospitalized for COVID-19 at [facility] in 1/21.    She was seen by [facility] Pulmonology after discharge from [place] admission and was noted to be volume overloaded, Lasix was changed to torsemide but she was unable to get to pharmacy to pick up. Subsequently, she developed progressive wt gain, nearly 20 lb and increased dyspnea, prompting her to report back to the ED in 2/21, where she was found to be in acute on chronic diastolic HF and readmitted for IV diuresis and AHF consultation. Echo in 2/21 showed LVEF 55-60%, moderate RV dilation/moderate RV dysfunction. [place] 2/21 showed elevated left and right heart filling pressures with severe pulmonary hypertension. PFTs completed showing severe restriction and severely decreased DLCO. High resolution CT was concerning  for small vessel disease, no ILD, and concerning for PAH.  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 She was continued on IV diuretics and had good response, diuresing 32 lb. She was transitioned  to torsemide 40 mg/20 mg. Opsumit and tadalafil restarted.   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  She is on Eliquis. RHC in 9/21 showed normal RA pressure and PCWP, moderate-severe pulmonary arterial hypertension.   Echo 5/22 showed EF 55% with mildly enlarged RV with mild RV dysfunction, mildly D-shaped septum, PASP 48 with normal IVC.   In 2/23, she had bilateral breast reduction surgery. She developed significant volume overload and her torsemide was increased.  Follow up 3/23 she was volume overloaded and torsemide increased.   Today she returns for HF follow up. Last several visits she has been volume overloaded in setting of diuretic noncompliance. Today, she is feeling better. She is down 15 lbs. Appetite is fair and she remains mildly dyspneic with activity and ADLs. She remains on 4L oxygen. Denies palpitations, abnormal bleeding, CP, dizziness, edema, or PND/Orthopnea. Appetite ok. No fever or chills. Weight at home 230 pounds. She has been taking her diuretics daily, as directed. Recently let go from temp job, applying for disability.  REDs: 41%  Labs (6/21): K 4.5, creatinine 2.09 Labs (8/21): K 4.3, creatinine 2.11 Labs (9/21): K 3.9, creatinine 2.0 Labs (10/21): BNP 58 Labs (12/21): K 3.8, creatinine 2.28 Labs (1/22): BNP 160, K 3.7, creatinine 1.79 Labs (3/22): BNP 15.7, K 3.9, creatinine 2.33 Labs (5/22): K 3.8, creatinine 2.25, BNP 117 Labs (8/22): K 3.9, creatinine 2.16, BNP 198 Labs (1/23): BNP 276 Labs (2/23):  K 3.8, creatinine 2.01 Labs (3/23): K 4.1, creatinine 2.19 Labs (5/23): K 4.0, creatinine 1.9  6 minute walk (9/21): 168 m 6 minute walk (10/21): 122 m 6 minute walk (3/22): 107 m  PMH: 1. OHS/OSA: Uses home oxygen.  2. CKD stage IV 3. COVID-19 PNA in 12/20 4. Restrictive lung disease: PFTs (2/21) with severe restriction, severely decreased DLCO.  - High resolution CT chest (2/21): no interstitial lung disease, "small  vessel disease."  - Breast reduction surgery 2/23 5. Gout 6. HTN 7. DVT: Remote, after train ride.  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  8. Pulmonary hypertension: RHC (2/21) with mean RA 14, PA 89/33 mean 54, mean PCWP 34, CI 2.22, PVR 3.87 WU => mixed pulmonary arterial/pulmonary venous hypertension.  - V/Q scan (2/21): No evidence for chronic PE.  - Echo (2/21): EF 55-60%, moderately dilated RV with moderately decreased systolic function.  - HIV and rheumatologic serologies negative.  - RHC (9/21): mean RA 9, PA 68/23 mean 40, mean PCWP 11, PVR 5.6 WU, no step up in oxygen saturation so no evidence for left to right shunting.  - Echo (5/22): EF 55% with mildly enlarged RV with mild RV dysfunction, mildly D-shaped septum, PASP 48 with normal IVC. 9. Depression   Current Outpatient Medications  Medication Sig Dispense Refill   acetaminophen (TYLENOL) 500 MG tablet Take 1,000 mg by mouth every 6 (six) hours as needed for moderate pain or headache.     allopurinol (ZYLOPRIM) 100 MG tablet Take 1 tablet (100 mg total) by mouth daily. 30 tablet 2   apixaban (ELIQUIS) 5 MG TABS tablet Take 1 tablet (5 mg total) by mouth 2 (two) times daily. 60 tablet 9   Calcium Carbonate Antacid (TUMS E-X PO) Take 1-2 tablets by mouth 3 (three) times daily as needed (gas/abdominal pain.).     Cholecalciferol (VITAMIN D3) 125 MCG (5000 UT) CAPS Take 5,000 Units by mouth in the morning.     colchicine 0.6 MG tablet Take 0.6 mg by mouth daily as needed (gout flare).     dapagliflozin propanediol (FARXIGA) 10 MG TABS tablet Take 1 tablet (10 mg total) by mouth daily before breakfast. 30 tablet 11   diclofenac Sodium (VOLTAREN) 1 % GEL Apply 2 g topically 3 (three) times daily as needed (pain.).     diphenhydramine-acetaminophen (TYLENOL PM) 25-500 MG TABS tablet Take 2 tablets by mouth at bedtime as needed (sleep).     ipratropium-albuterol (DUONEB) 0.5-2.5 (3) MG/3ML SOLN Take 3 mLs by  nebulization every 6 (six) hours as needed (wheezing/shortness of breath).     macitentan (OPSUMIT) 10 MG tablet Take 1 tablet (10 mg total) by mouth daily. 90 tablet 3   OXYGEN Inhale 2-8 L into the lungs continuous.     potassium chloride SA (KLOR-CON M) 20 MEQ tablet Take 20 mEq by mouth daily.     sertraline (ZOLOFT) 50 MG tablet Take 1 tablet (50 mg total) by mouth daily. 30 tablet 2   spironolactone (ALDACTONE) 25 MG tablet TAKE 1/2 TABLET(12.5 MG) BY MOUTH DAILY 45 tablet 3   tadalafil, PAH, (ADCIRCA) 20 MG tablet Take 20 mg by mouth 2 (two) times daily.     torsemide (DEMADEX) 20 MG tablet Take 2 tablets (40 mg total) by mouth 2 (two) times daily. 120 tablet 2   UPTRAVI 800 MCG TABS Take 1 tablet twice daily. 60 tablet 11   No current facility-administered medications for this encounter.  Allergies  Allergen Reactions   Adhesive [Tape] Hives    Tolerates paper tape   Succinylcholine Other (See Comments)    Have trouble waking up   Tizanidine Hives      Social History   Socioeconomic History   Marital status: Single    Spouse name: Not on file   Number of children: Not on file   Years of education: Not on file   Highest education level: Not on file  Occupational History   Not on file  Tobacco Use   Smoking status: Never   Smokeless tobacco: Never  Vaping Use   Vaping Use: Never used  Substance and Sexual Activity   Alcohol use: Not Currently   Drug use: Not Currently   Sexual activity: Not Currently    Partners: Male    Birth control/protection: Post-menopausal    Comment: 1st intercourse- 66, partners- more than 5  Other Topics Concern   Not on file  Social History Narrative   Not on file   Social Determinants of Health   Financial Resource Strain: Not on file  Food Insecurity: Not on file  Transportation Needs: Not on file  Physical Activity: Not on file  Stress: Not on file  Social Connections: Not on file  Intimate Partner Violence: Not on file      Family History  Problem Relation Age of Onset   COPD Father    Cancer Mother        gallbladder and bladder    Hypertension Brother    Breast cancer Other    Diabetes Son    Breast cancer Cousin    Breast cancer Cousin    Pulmonary Hypertension Neg Hx    BP (!) 130/92   Pulse 93   Wt 104.3 kg (230 lb)   LMP  (LMP Unknown) Comment: pmb last 2-3 months  SpO2 (!) 87% Comment: on room air  BMI 44.18 kg/m   Wt Readings from Last 3 Encounters:  03/17/22 104.3 kg (230 lb)  03/03/22 111.1 kg (245 lb)  02/17/22 112.2 kg (247 lb 6.4 oz)   PHYSICAL EXAM:  General:  NAD. No resp difficulty, arrived in [facility] on oxygen. HEENT: Normal Neck: Supple. No JVD. Carotids 2+ bilat; no bruits. No lymphadenopathy or thryomegaly appreciated. Cor: PMI nondisplaced. Regular rate & rhythm. No rubs, gallops or murmurs. Lungs: Clear Abdomen: Obese, nontender, nondistended. No hepatosplenomegaly. No bruits or masses. Good bowel sounds. Extremities: No cyanosis, clubbing, rash, edema Neuro: Alert & oriented x 3, cranial nerves grossly intact. Moves all 4 extremities w/o difficulty. Affect pleasant.  ASSESSMENT & PLAN: 1. Chronic diastolic CHF with significant RV dysfunction: Echo in 2/21 with LVEF 55-60%, moderate RV dilation/moderate RV dysfunction.  [place] 2/18 showed elevated left and right heart filling pressures with severe pulmonary hypertension.  Suspect mixed pulmonary venous/pulmonary arterial hypertension in setting of significant LV diastolic dysfunction (PVR 3.87 WU).  Repeat RHC in 9/21 with moderate-severe PAH, normal filling pressures, and PVR 5.6 WU.  No evidence for left=>right shunting.  Echo in 5/22 showed EF 55% with mildly enlarged RV with mild RV dysfunction, mildly D-shaped septum, PASP 48 with normal IVC. Chronically NYHA III, body habitus and physical deconditioning contributing. She does not appear volume overloaded today, weight down 15 lbs. ReDs 41% (? Accuracy due to body  habitus). - Continue torsemide 40 mg bid. Recent labs ok, SCr 1.9, K 4.0 - Continue spiro 12.5 mg daily. - Continue KCl 20 mEq daily.  - Continue Farxiga 10 mg  daily.  - Wear compression stockings.  2. Pulmonary hypertension: Suspect that this is a mixed picture with group 2 and group 3 PH (related to diastolic LV dysfunction and OHS/OSA).  However, concerned for component of group 1 PAH as well.  V/Q scan not suggestive of chronic PEs and high resolution CT chest in 2/21 was not suggestive of ILD. HIV and rheumatologic serologies negative.  RHC showed severe mixed pulmonary venous/pulmonary arterial hypertension, repeat RHC after diuresis in 9/21 showed PAH. PFTs completed in 2/21 showed severe restriction, severely decreased DLCO (?restriction due to body habitus).  Echo in 5/22 showed mild improvement in RV and improvement in estimated PA systolic pressure.  She is on Opsumit, tadalafil and Uptravi 800 mcg bid.   - She is reusing 6 MW today. - Continue to maintain oxygen saturation with home O2 and CPAP. Weight loss also would be helpful.  She has had a breast reduction surgery which may decrease lung restriction.  - Continue Opsumit 10 mg daily. - Continue tadalafil 40 mg daily.  - She is tolerating Uptravi 800 mcg bid now, she has not tolerate uptitration.  3. HTN: BP generally well-controlled. Mildly elevated today but she has not had AM med yet. 4. CKD stage 3. Recent SCr stable. - Followup with nephrology.  5. OHS/OSA: Now s/p breast reduction surgery which hopefully will help with OHS.  Compliant w/ CPAP and home oxygen.  6. 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  As she is at baseline sedentary and has had recurrent DVTs, would continue Eliquis long-term.   7. Restrictive lung disease: Now s/p breast reduction surgery.  8. Obesity: Body mass index is 44.18 kg/m. - Congratulated on recent weight loss. - Referred to [facility].  9. Depression: Continue  sertraline. Mood seems improved since increasing dose.  Follow up in 2 months with Dr. Aundra Dubin as scheduled.  Signed, Rafael Bihari, FNP  03/17/2022  [facility] [address] [facility] [place] [address] [phone] (office) [phone] (fax)

## 2022-03-17 NOTE — Patient Instructions (Addendum)
No Labs done today.   No medication changes were made. Please continue all current medications as prescribed.  Your physician recommends that you keep your scheduled follow-up appointment on July 28th with Dr. Aundra Dubin  If you have any questions or concerns before your next appointment please send [place] a message through [facility] or call our office at [phone].    TO LEAVE A MESSAGE FOR THE NURSE SELECT OPTION 2, PLEASE LEAVE A MESSAGE INCLUDING: YOUR NAME DATE OF BIRTH CALL BACK NUMBER REASON FOR CALL**this is important as we prioritize the call backs  YOU WILL RECEIVE A CALL BACK THE SAME DAY AS LONG AS YOU CALL BEFORE 4:00 PM   Do the following things EVERYDAY: Weigh yourself in the morning before breakfast. Write it down and keep it in a log. Take your medicines as prescribed Eat low salt foods--Limit salt (sodium) to 2000 mg per day.  Stay as active as you can everyday Limit all fluids for the day to less than 2 liters   At the [facility], you and your health needs are our priority. As part of our continuing mission to provide you with exceptional heart care, we have created designated Provider Care Teams. These Care Teams include your primary Cardiologist (physician) and Advanced Practice Providers (APPs- Physician Assistants and Nurse Practitioners) who all work together to provide you with the care you need, when you need it.   You may see any of the following providers on your designated Care Team at your next follow up: Dr Glori Bickers Dr Haynes Kerns, NP Lyda Jester, [place] Audry Riles, PharmD   Please be sure to bring in all your medications bottles to every appointment.

## 2022-03-20 ENCOUNTER — Other Ambulatory Visit (HOSPITAL_COMMUNITY): Payer: Self-pay | Admitting: *Deleted

## 2022-03-20 MED ORDER — DAPAGLIFLOZIN PROPANEDIOL 10 MG PO TABS: 10.0000 mg | ORAL_TABLET | Freq: Every day | ORAL | 11 refills | Status: DC

## 2022-03-24 ENCOUNTER — Ambulatory Visit (INDEPENDENT_AMBULATORY_CARE_PROVIDER_SITE_OTHER): Payer: Medicare Other | Admitting: Primary Care

## 2022-03-24 ENCOUNTER — Encounter: Payer: Self-pay | Admitting: Primary Care

## 2022-03-24 VITALS — BP 128/84 | HR 95 | Temp 97.8°F | Ht 61.0 in | Wt 235.2 lb

## 2022-03-24 DIAGNOSIS — J9611 Chronic respiratory failure with hypoxia: Secondary | ICD-10-CM

## 2022-03-24 DIAGNOSIS — Z9989 Dependence on other enabling machines and devices: Secondary | ICD-10-CM

## 2022-03-24 DIAGNOSIS — G4733 Obstructive sleep apnea (adult) (pediatric): Secondary | ICD-10-CM

## 2022-03-24 DIAGNOSIS — I272 Pulmonary hypertension, unspecified: Secondary | ICD-10-CM

## 2022-03-24 DIAGNOSIS — J984 Other disorders of lung: Secondary | ICD-10-CM | POA: Diagnosis not present

## 2022-03-24 DIAGNOSIS — R5381 Other malaise: Secondary | ICD-10-CM

## 2022-03-24 MED ORDER — FLUTICASONE FUROATE-VILANTEROL 100-25 MCG/ACT IN AEPB: 1.0000 | INHALATION_SPRAY | Freq: Every day | RESPIRATORY_TRACT | 5 refills | Status: DC

## 2022-03-24 NOTE — Assessment & Plan Note (Addendum)
-   Exertional dyspnea and chest tightness. No currently on maintenance inhaler for asthma/restrictive lung disease. She had breast reduction surgery in February 2023. Advised she resume BREO 157mg and cont ipratropium-albuterol neb q 6 hours for breakthrough sob/wheezing. Continue to work on weight loss efforts.

## 2022-03-24 NOTE — Patient Instructions (Addendum)
Patient changes today except encouraged resuming Breo use to see if this can help with chest tightness  Would like you to get some physical therapy we will place an order for home evaluation  Sleep apnea is well controlled on CPAP continue to use every night  Recommendations: -Restart Breo Ellipta take 1 puff daily in the morning -Nebs nebulizer every 6 hours as needed for breakthrough shortness of breath or wheezing -Continue Opsumit 10 mg daily -Continue Uptravi 800 mcg 1 tablet twice daily -Continue tadalafil 20 mg 2 times daily -Continue torsemide 40 mg twice daily -Continue spironolactone 12.5 mg daily -Continue potassium supplement -Continue Eliquis 5 mg twice daily -You can get medical alert bracelet from any online store, you will want to include pertinent healthy information and drug allergies ( heart failure, pulmonary hypertension, chronic respiratory failure on oxygen)  Orders: - POC walk today  - Paper work for handicap placard   Referral: - Physical therapy home eval/treat re: physical deconditioning/chronic resp failure   Follow-up:  4 months with Dr. Loanne Drilling or sooner if needed

## 2022-03-24 NOTE — Progress Notes (Signed)
$'@Patient'B$  ID: Dawn Fowler, female    DOB: 1966/01/14, 56 y.o.   MRN: [id number]  Chief Complaint  Patient presents with   Follow-up    Referring provider: Hayden Rasmussen, MD  HPI: 56 year old female, never smoked. PMH significant for chronic diastolic heart failure, pulmonary hypertension, chronic respiratory failure, OSA on CPAP, restrictive lung disease, chronic kidney disease stage III, obesity.  Patient of Dr. Loanne Drilling, last seen on 08/02/21 (seen for acute visit on 11/07/21 by Dr. Shearon Stalls for URI).   03/24/2022 Patient presents today for overdue follow-up, called to scheduled visit because she is not feeling well and is more short of breath. She is accompanied by her sister. She reports trouble walking and difficult doing house hold tasks. She is deconditioned, not getting much activity. Using rolling walker to get around. She experiences chest tightness with exertion. Not using BREO consistently. No SABA use. She is using 4L oxygen at rest; up to 8L with exertion. Compliant with CPAP use with oxygen blended in. She had breast reduction surgery feb 2023. Working on weight loss, down 15 lbs. No leg swelling. She is taking opsumit, tadalafil, Uptravi, torsemide and spirolactone as directed.  Denies active cough, chest tightness or wheezing.    Allergies  Allergen Reactions   Adhesive [Tape] Hives    Tolerates paper tape   Succinylcholine Other (See Comments)    Have trouble waking up   Tizanidine Hives    Immunization History  Administered Date(s) Administered   Fluad Quad(high Dose 65+) 09/20/2020   Influenza Whole 08/20/2019   Influenza,inj,Quad PF,6+ Mos 06/25/2018   PFIZER(Purple Top)SARS-COV-2 Vaccination 02/02/2020, 02/24/2020, 09/27/2020   Pneumococcal Polysaccharide-23 07/17/2014    Past Medical History:  Diagnosis Date   Acute respiratory failure due to COVID-19 ([place]) 10/30/2019   Anxiety    Arthritis    CHF (congestive heart failure) (HCC)    CKD (chronic kidney  disease), stage III (HCC)    Complication of anesthesia    hard to wake up after general anesthesia - had trouble waking up after succinylcholine (consider possibility of pseudocholinesterase deficiency)   COVID-19 virus detected 11/20/2019   Tested positive for Covid in December/2020 Required hospitalization in December/2020 through January/2021 Received Redemsivir   Depression    DVT (deep venous thrombosis) (HCC) 06/09/2020   RLE   Dyspnea    Family history of adverse reaction to anesthesia    hard to wake up after general anesthesia   GERD (gastroesophageal reflux disease)    HLD (hyperlipidemia)    HTN (hypertension)    Pre-diabetes    no meds   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; 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     allopurinol (ZYLOPRIM) 100 MG tablet Take 1 tablet (100 mg total) by mouth daily. 30 tablet 2   apixaban (ELIQUIS) 5 MG TABS tablet Take 1 tablet (5 mg total) by mouth 2 (two) times daily. 60 tablet 9   Calcium  Carbonate Antacid (TUMS E-X PO) Take 1-2 tablets by mouth 3 (three) times daily as needed (gas/abdominal pain.).     Cholecalciferol (VITAMIN D3) 125 MCG (5000 UT) CAPS Take 5,000 Units by mouth in the morning.     colchicine 0.6 MG tablet Take 0.6 mg by mouth daily as needed (gout flare).     dapagliflozin propanediol (FARXIGA) 10 MG TABS tablet Take 1 tablet (10 mg total) by mouth daily before breakfast. 30 tablet 11   diclofenac Sodium  (VOLTAREN) 1 % GEL Apply 2 g topically 3 (three) times daily as needed (pain.).     diphenhydramine-acetaminophen (TYLENOL PM) 25-500 MG TABS tablet Take 2 tablets by mouth at bedtime as needed (sleep).     ipratropium-albuterol (DUONEB) 0.5-2.5 (3) MG/3ML SOLN Take 3 mLs by nebulization every 6 (six) hours as needed (wheezing/shortness of breath).     macitentan (OPSUMIT) 10 MG tablet Take 1 tablet (10 mg total) by mouth daily. 90 tablet 3   OXYGEN Inhale 2-8 L into the lungs continuous.     potassium chloride SA (KLOR-CON M) 20 MEQ tablet Take 20 mEq by mouth daily.     sertraline (ZOLOFT) 50 MG tablet Take 1 tablet (50 mg total) by mouth daily. 30 tablet 2   spironolactone (ALDACTONE) 25 MG tablet TAKE 1/2 TABLET(12.5 MG) BY MOUTH DAILY 45 tablet 3   tadalafil, PAH, (ADCIRCA) 20 MG tablet Take 20 mg by mouth 2 (two) times daily.     torsemide (DEMADEX) 20 MG tablet Take 2 tablets (40 mg total) by mouth 2 (two) times daily. 120 tablet 2   UPTRAVI 800 MCG TABS Take 1 tablet twice daily. 60 tablet 11   No facility-administered medications prior to visit.    Review of Systems  Review of Systems  Constitutional: Negative.   HENT: Negative.    Respiratory:  Positive for shortness of breath. Negative for cough, chest tightness and wheezing.        Exertional dyspnea and chest tightness    Physical Exam  BP 128/84 (BP Location: Right Arm, Patient Position: Sitting, Cuff Size: Normal)   Pulse 95   Temp 97.8 F (36.6 C) (Oral)   Ht '5\' 1"'$  (1.549 m)   Wt 235 lb 3.2 oz (106.7 kg)   LMP  (LMP Unknown) Comment: pmb last 2-3 months  SpO2 94%   BMI 44.44 kg/m  Physical Exam Constitutional:      Appearance: Normal appearance.  HENT:     Head: Normocephalic and atraumatic.     Mouth/Throat:     Mouth: Mucous membranes are moist.     Pharynx: Oropharynx is clear.  Cardiovascular:     Rate and Rhythm: Normal rate and regular rhythm.     Comments: No edema Pulmonary:     Effort:  Pulmonary effort is normal.     Breath sounds: Normal breath sounds. No wheezing, rhonchi or rales.     Comments: 4L supplemental O2 Musculoskeletal:        General: Normal range of motion.  Skin:    General: Skin is warm.  Neurological:     General: No focal deficit present.     Mental Status: She is alert and oriented to person, place, and time. Mental status is at baseline.  Psychiatric:        Mood and Affect: Mood normal.        Behavior: Behavior normal.        Thought Content: Thought content normal.  Judgment: Judgment normal.     Lab Results:  CBC    Component Value Date/Time   WBC 11.2 (H) 12/21/2021 2015   RBC 4.56 12/21/2021 2015   HGB 13.4 12/21/2021 2015   HCT 43.4 12/21/2021 2015   PLT 297 12/21/2021 2015   MCV 95.2 12/21/2021 2015   MCH 29.4 12/21/2021 2015   MCHC 30.9 12/21/2021 2015   RDW 16.3 (H) 12/21/2021 2015   LYMPHSABS 1.6 12/17/2019 1655   MONOABS 0.4 12/17/2019 1655   EOSABS 0.1 12/17/2019 1655   BASOSABS 0.0 12/17/2019 1655    BMET    Component Value Date/Time   NA 139 03/03/2022 0924   K 4.0 03/03/2022 0924   CL 107 03/03/2022 0924   CO2 24 03/03/2022 0924   GLUCOSE 102 (H) 03/03/2022 0924   BUN 34 (H) 03/03/2022 0924   CREATININE 1.90 (H) 03/03/2022 0924   CALCIUM 9.3 03/03/2022 0924   GFRNONAA 31 (L) 03/03/2022 0924   GFRAA 31 (L) 07/12/2020 0902    BNP    Component Value Date/Time   BNP 279.4 (H) 02/17/2022 1243    ProBNP No results found for: PROBNP  Imaging: No results found.   Assessment & Plan:   Pulmonary hypertension (HCC) Group II/III (Unable to rule out group 1). No a transplant candidate d/t BMI. She actually looks pretty well today but does reports exertional dyspnea and chest tightness. Her weight is down, she is actively trying to lose weight. Following with cardiology. Continue opsumit, tadalafil, Uptravi, torsemide and spirolactone.   Restrictive lung disease - Exertional dyspnea and chest  tightness. No currently on maintenance inhaler for asthma/restrictive lung disease. She had breast reduction surgery in February 2023. Advised she resume BREO 122mg and cont ipratropium-albuterol neb q 6 hours for breakthrough sob/wheezing. Continue to work on weight loss efforts.   OSA on CPAP - Patient is 100% compliant with CPAP use.  Current pressure setting 9 cm H2O with residual AHI 1.4.  No pressure changes recommended today.  Continue to encourage patient to wear CPAP every night for minimum of 4 to 6 hours or longer with oxygen.  Chronic hypoxemic respiratory failure (HCC) - Patient is maintained on continuous oxygen, she uses 4 L at rest and 8 L with exertion.  She was unable to qualify for POC today, desatured to 86% on 4 L pulsed. Unable to pursue pulmonary rehab at this time.  Placing order for PT home eval and treat.   EMartyn Ehrich NP 03/24/2022

## 2022-03-24 NOTE — Assessment & Plan Note (Signed)
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 No a transplant candidate d/t BMI. She actually looks pretty well today but does reports exertional dyspnea and chest tightness. Her weight is down, she is actively trying to lose weight. Following with cardiology. Continue opsumit, tadalafil, Uptravi, torsemide and spirolactone.

## 2022-03-24 NOTE — Assessment & Plan Note (Signed)
-   Patient is 100% compliant with CPAP use.  Current pressure setting 9 cm H2O with residual AHI 1.4.  No pressure changes recommended today.  Continue to encourage patient to wear CPAP every night for minimum of 4 to 6 hours or longer with oxygen.

## 2022-03-24 NOTE — Assessment & Plan Note (Addendum)
-   Patient is maintained on continuous oxygen, she uses 4 L at rest and 8 L with exertion.  She was unable to qualify for POC today, desatured to 86% on 4 L pulsed. Unable to pursue pulmonary rehab at this time.  Placing order for PT home eval and treat.

## 2022-04-06 ENCOUNTER — Telehealth: Payer: Self-pay | Admitting: Primary Care

## 2022-04-06 DIAGNOSIS — R5381 Other malaise: Secondary | ICD-10-CM

## 2022-04-07 NOTE — Telephone Encounter (Signed)
Yes to the above. Please place order for social worker and OT

## 2022-04-07 NOTE — Telephone Encounter (Signed)
Called and spoke to Angie to confirm that the physical therapy schedule was approved per [facility]. And orders for social worker and OT was approved and placed today. Nothing further needed

## 2022-04-07 NOTE — Telephone Encounter (Signed)
Annie Paras,   I called Angie from [facility] and she is wanting to know.   Are you ok with   Physical Therapy for   1x week for 1 week 2x week for 4 weeks 1x week for 4 weeks   And she is also wanting to know if we could make a referral to:  Education officer, museum  Occupational therapy.  Please advise [employer]

## 2022-04-10 ENCOUNTER — Telehealth (HOSPITAL_COMMUNITY): Payer: Self-pay | Admitting: Licensed Clinical Social Worker

## 2022-04-11 NOTE — Progress Notes (Signed)
Heart and Vascular Care Navigation  04/11/2022  Dawn Fowler 09-06-1966 [id number]  Reason for Referral: Pt called CSW to discuss concerns with loss of insurance and finances.   Engaged with patient by telephone for follow up visit for Heart and Vascular Care Coordination.                                                                                                   Assessment:   Pt called CSW and informed that she has lost her medicare and her SSDI benefits.  States that she had started working remotely and was told by [facility] that she now made to much money to receive monetary benefits.  She was then deemed to no longer meet criteria for disability and lost her insurance- states it ended in April.  She was then laid off from her job in the beginning of May and now she is receiving unemployment benefits and attempting to find new job at this time.  She is now worried about paying for necessary medical needs now that she has lost insurance- was supposed to start pulmonary rehab shortly.  CSW explained that without coverage this would mean she would be getting billed as self pay.  Already has 2 bills that have occurred since insurance was lost so CSW sent [employer] for her to apply for assistance with [facility] bills.  Also sent information about [employer] insurance.  Pt has not applied for food stamps at this time- is aware of how to and will plan on initiating this process to help with financial strain.  Has all medications at this time but unsure how she will get in the future- has multiple medications that she gets through patient assistance programs- informed her she should call back the clinic when she has 1-2 weeks of her other less expensive medications and we could help her figure out how to get potentially though HF fund.  Pt does report she gets Eliquis at pharmacy so CSW sent PAP for her to complete.  Pt gets oxygen through [employer]- needs new supplies but unable to order without insurance.  CSW sent  her [employer] application to complete.                                HRT/VAS Care Coordination     Outpatient Care Team [facility]   Community Paramedic Name: [facility] 02/03/21 as no longer needing services   Social Worker Name: Tammy Sours- [facility]- [phone]   Living arrangements for the past 2 months Apartment   Lives with: Self   Patient Current Insurance Coverage Managed Medicare   Does Patient Have Prescription Coverage? Yes   Patient Prescription Assistance Programs Patient Assistance Programs   Patient Assistance Programs Medications application sent in for [employer] for eliquis assistance on 01/04/21   Other Assistance Programs Medications approved for Partial Extra Help program on 12/14/20   Home Assistive Devices/Equipment Oxygen; CPAP   Current home services DME  Has 02 via [place] and CPAP- Rollator.  Social History:                                                                             SDOH Screenings   Alcohol Screen: Not on file  Depression (ZOX0-9): Not on file  Financial Resource Strain: High Risk (04/10/2022)   Overall Financial Resource Strain (CARDIA)    Difficulty of Paying Living Expenses: Very hard  Food Insecurity: Food Insecurity Present (04/11/2022)   Hunger Vital Sign    Worried About Running Out of Food in the Last Year: Sometimes true    Ran Out of Food in the Last Year: Never true  Housing: Medium Risk (04/10/2022)   Housing    Last Housing Risk Score: 1  Physical Activity: Not on file  Social Connections: Not on file  Stress: Not on file  Tobacco Use: Low Risk  (03/24/2022)   Patient History    Smoking Tobacco Use: Never    Smokeless Tobacco Use: Never    Passive Exposure: Not on file  Recent Concern: Tobacco Use - Medium Risk (02/17/2022)   Patient History    Smoking Tobacco Use: Former    Smokeless Tobacco Use: Never    Passive Exposure: Not on file  Transportation Needs: No Transportation Needs (11/11/2020)    PRAPARE - Transportation    Lack of Transportation (Medical): No    Lack of Transportation (Non-Medical): No    SDOH Interventions: Financial Resources:    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  Will reach out to CSW for further assistance.  Jorge Ny, LCSW Clinical Social Worker [facility] Desk#: [phone] Cell#: [phone]

## 2022-04-13 ENCOUNTER — Telehealth (HOSPITAL_COMMUNITY): Payer: Self-pay | Admitting: Pharmacy Technician

## 2022-04-13 ENCOUNTER — Other Ambulatory Visit (HOSPITAL_COMMUNITY): Payer: Self-pay

## 2022-04-13 NOTE — Telephone Encounter (Addendum)
Advanced [facility] Patient Advocate Encounter  Received Opsumit and Uptravi [employer] patient assistance. Called and spoke with the patient who is now uninsured. Patient stated she sent her portion in. Provider portion sent via fax.  Will follow up.

## 2022-04-17 ENCOUNTER — Encounter (HOSPITAL_COMMUNITY): Payer: Self-pay | Admitting: Cardiology

## 2022-04-17 ENCOUNTER — Telehealth (HOSPITAL_COMMUNITY): Payer: Self-pay | Admitting: Pharmacy Technician

## 2022-04-17 ENCOUNTER — Encounter: Payer: Self-pay | Admitting: Pulmonary Disease

## 2022-04-17 ENCOUNTER — Other Ambulatory Visit (HOSPITAL_COMMUNITY): Payer: Self-pay | Admitting: *Deleted

## 2022-04-17 MED ORDER — APIXABAN 5 MG PO TABS: 5.0000 mg | ORAL_TABLET | Freq: Two times a day (BID) | ORAL | 9 refills | Status: DC

## 2022-04-17 NOTE — Telephone Encounter (Signed)
Advanced Heart Failure Patient Advocate Encounter  Patient recently has become uninsured. Sent [employer] application for Eliquis assistance via fax, Friday 06/16. Document scanned to chart.

## 2022-04-19 ENCOUNTER — Telehealth (HOSPITAL_COMMUNITY): Payer: Self-pay | Admitting: Licensed Clinical Social Worker

## 2022-04-19 NOTE — Telephone Encounter (Signed)
CSW called pt to check in if she had applied for SNAP yet- pt confirms she has submitted application and is awaiting determination  Jorge Ny, [place] Desk#: [phone] Cell#: [phone]

## 2022-04-21 ENCOUNTER — Telehealth (HOSPITAL_COMMUNITY): Payer: Self-pay | Admitting: Licensed Clinical Social Worker

## 2022-04-21 NOTE — Telephone Encounter (Signed)
Pt called CSW yesterday to inquire about picking up Eliquis samples and providing [facility] Para pt assistance for [employer] and Opsumit.  Pt came to clinic and CSW provided with samples and got PAP from pt- provided to Patient Advocate for completion and follow up.  Burna Sis, LCSW Clinical Social Worker [facility] Desk#: [phone] Cell#: [phone]

## 2022-04-30 IMAGING — DX DG WRIST COMPLETE 3+V*L*
4 series · 4 of 4 positions shown · non-contrast
Comparison: None.

CLINICAL DATA: Wrist pain

EXAM:
LEFT WRIST - COMPLETE 3+ VIEW

[dg wrist complete left (1 of 4)]
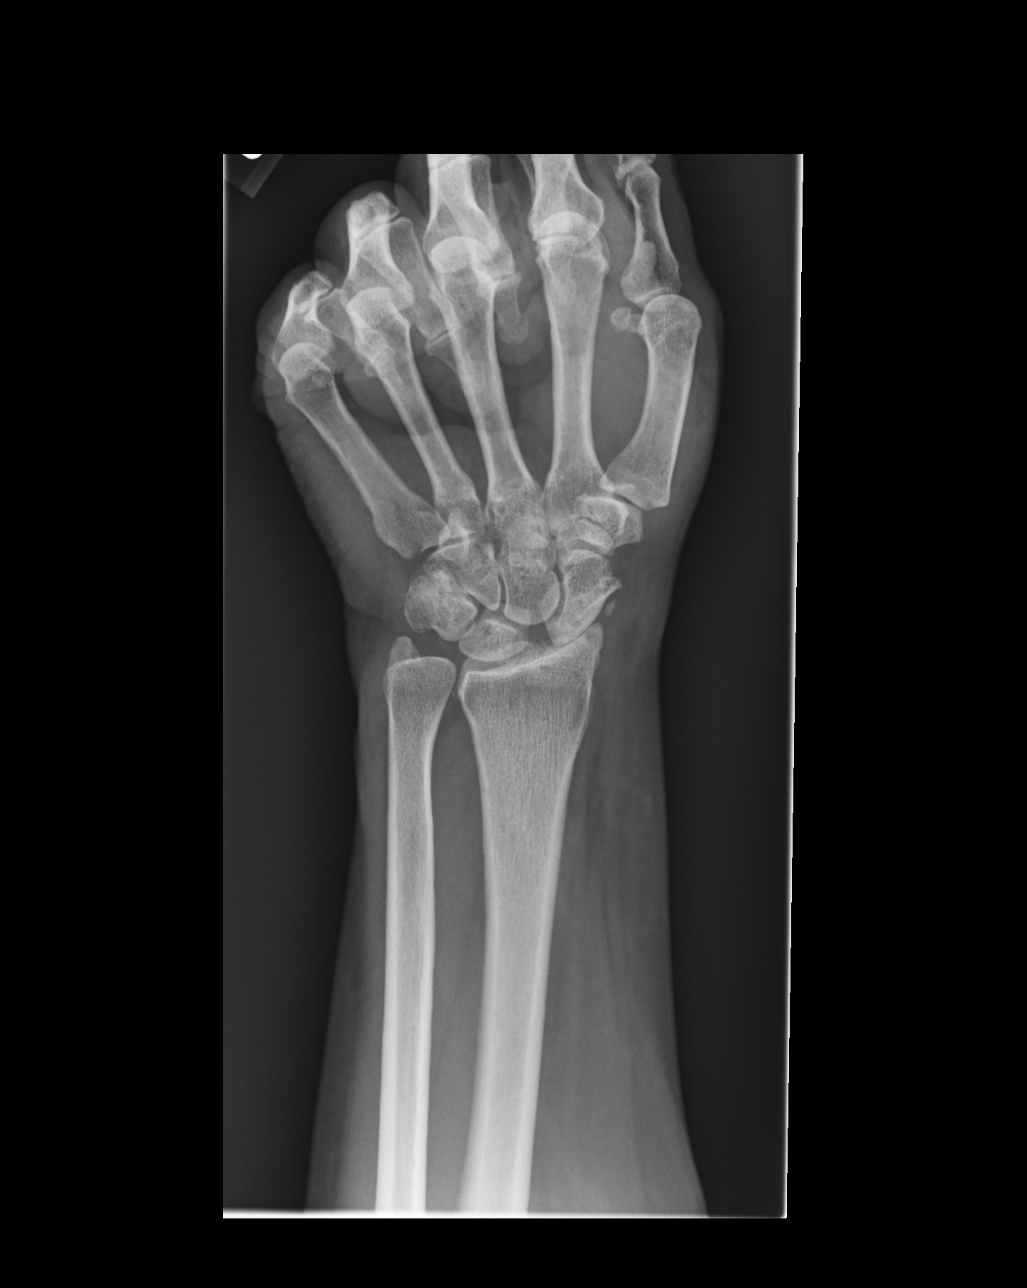

[dg wrist complete left (2 of 4)]
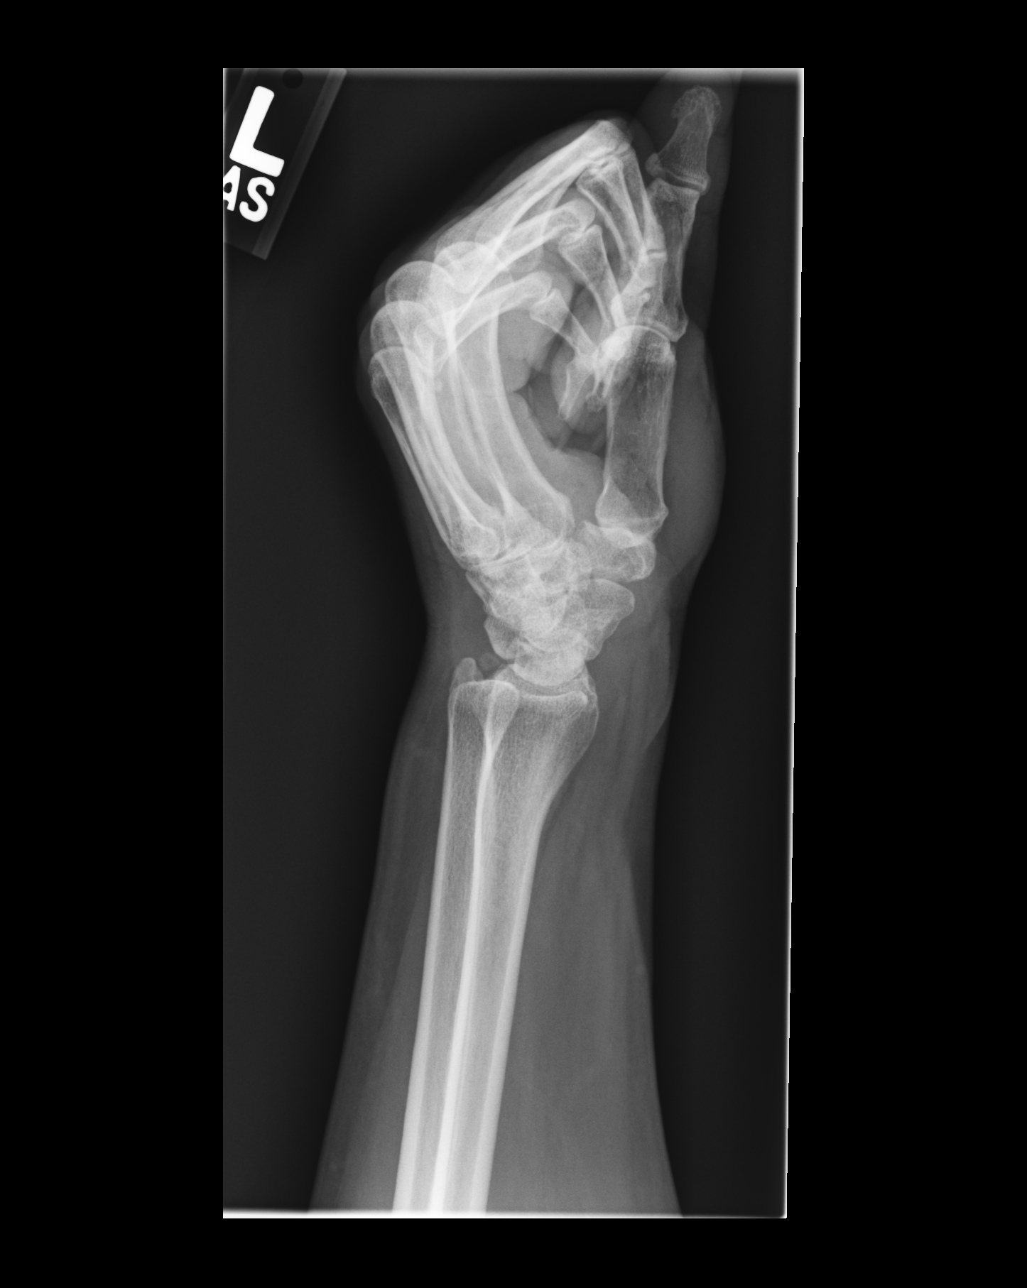

[dg wrist complete left (3 of 4)]
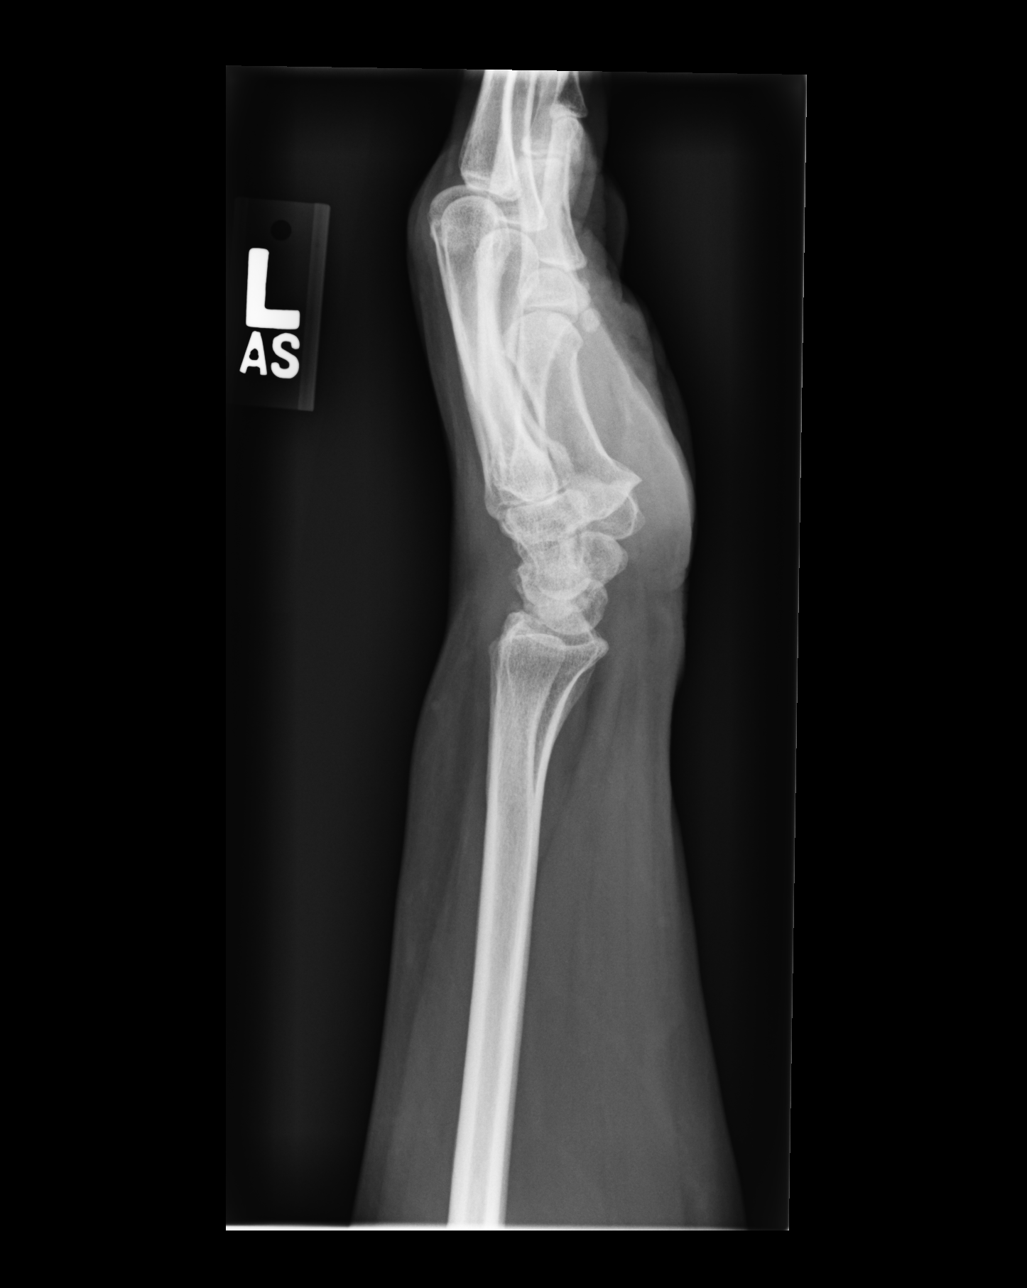

[dg wrist complete left (4 of 4)]
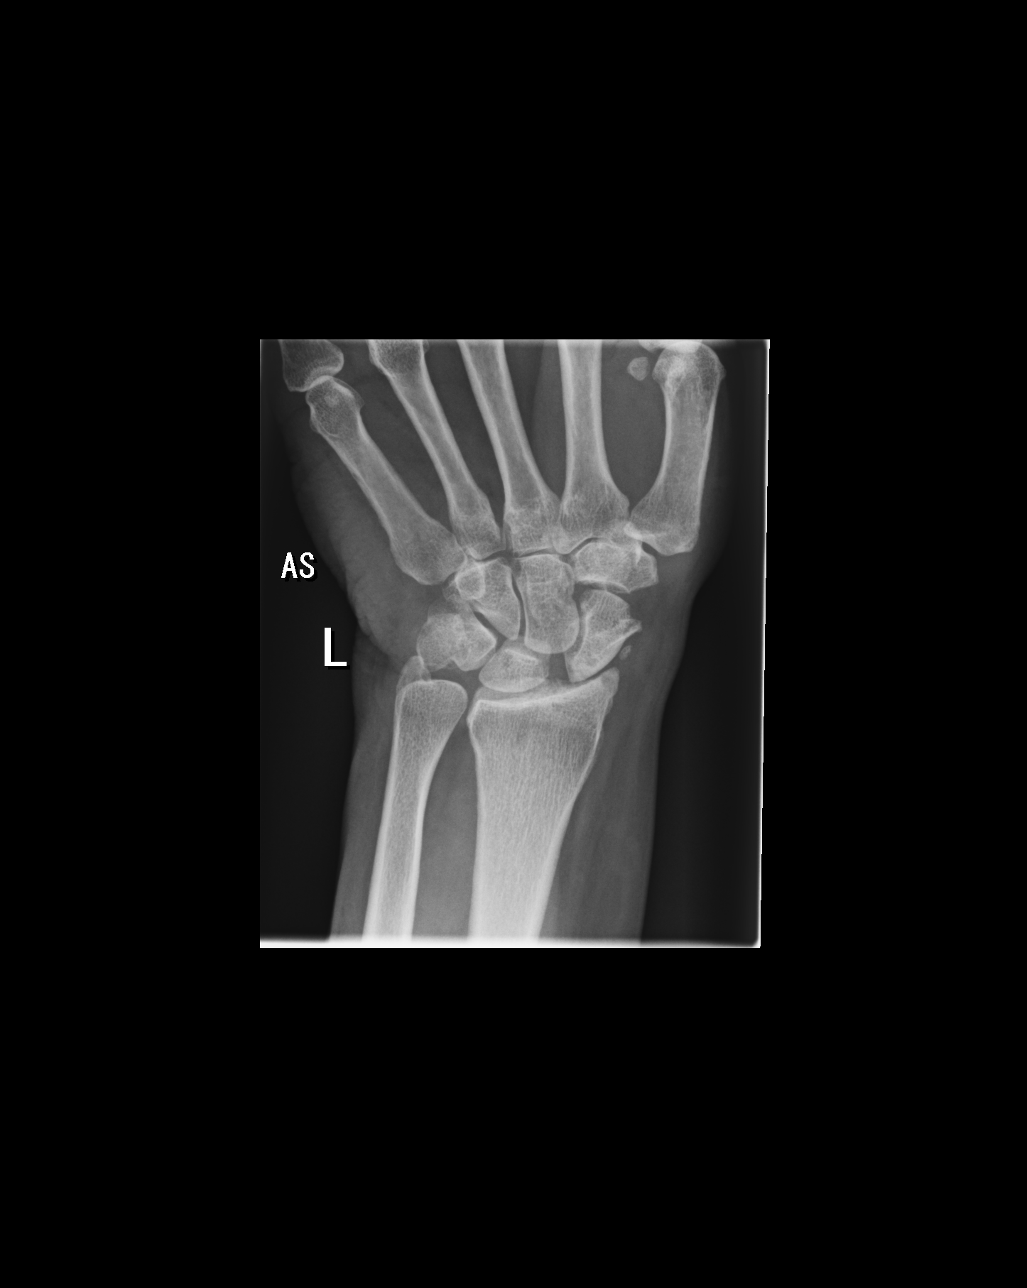

[4 of 4 positions shown; findings below may reference images not displayed]

FINDINGS: No fracture or malalignment. Moderate arthritis at the radiocarpal
interval. Widened scapholunate interval up to 4 mm.
IMPRESSION: 1. Moderate arthritis at the radiocarpal joint.
2. Widened scapholunate interval suspicious for ligamentous disease.

## 2022-04-30 IMAGING — US US PELVIS COMPLETE WITH TRANSVAGINAL
1 series · 13 of 25 positions shown · non-contrast
Comparison: Ultrasound 03/04/2020

CLINICAL DATA: Post menopausal bleeding

EXAM:
TRANSABDOMINAL AND TRANSVAGINAL ULTRASOUND OF PELVIS
TECHNIQUE: Both transabdominal and transvaginal ultrasound examinations of the
pelvis were performed. Transabdominal technique was performed for
global imaging of the pelvis including uterus, ovaries, adnexal
regions, and pelvic cul-de-sac. It was necessary to proceed with
endovaginal exam following the transabdominal exam to visualize the
uterus endometrium ovaries.

[Series 1: us pelvis complete with transvaginal · 0.30mm/px · 13 of 64 slices shown]
[im 1/64]
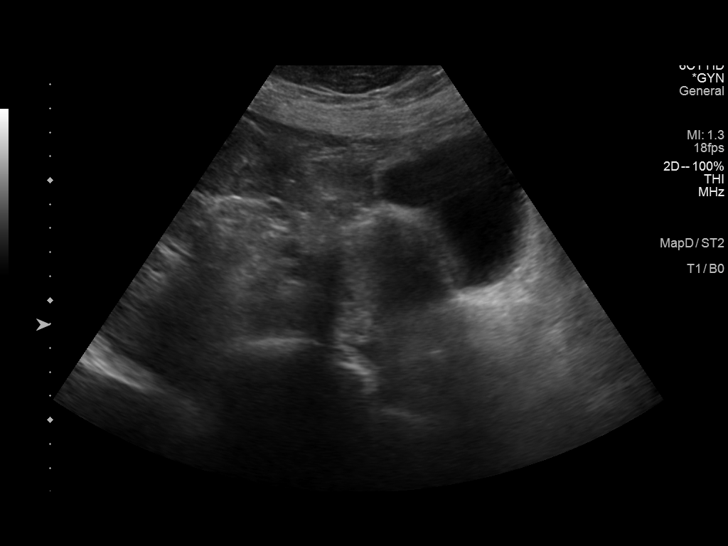
[im 6/64]
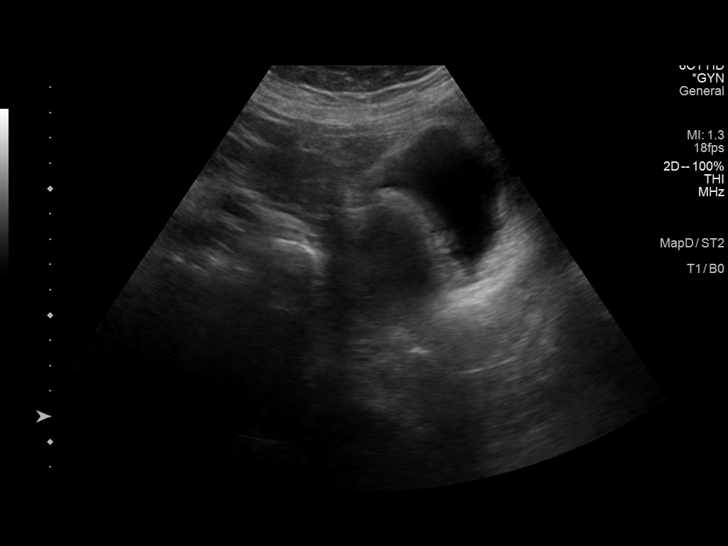
[im 11/64]
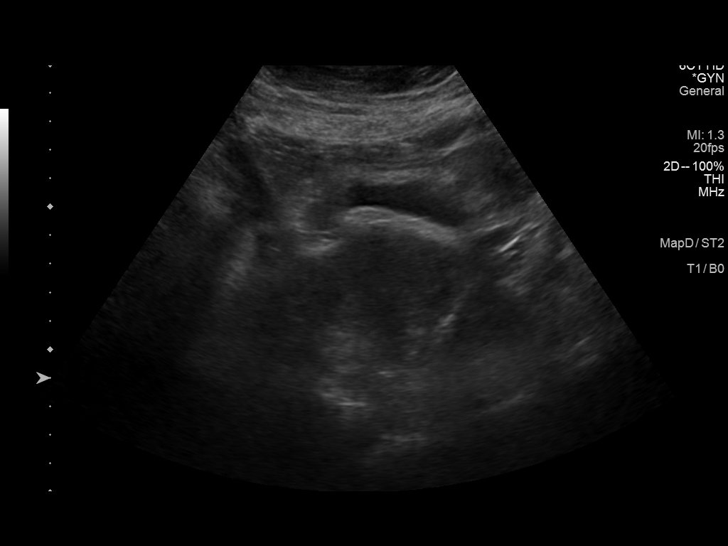
[im 16/64]
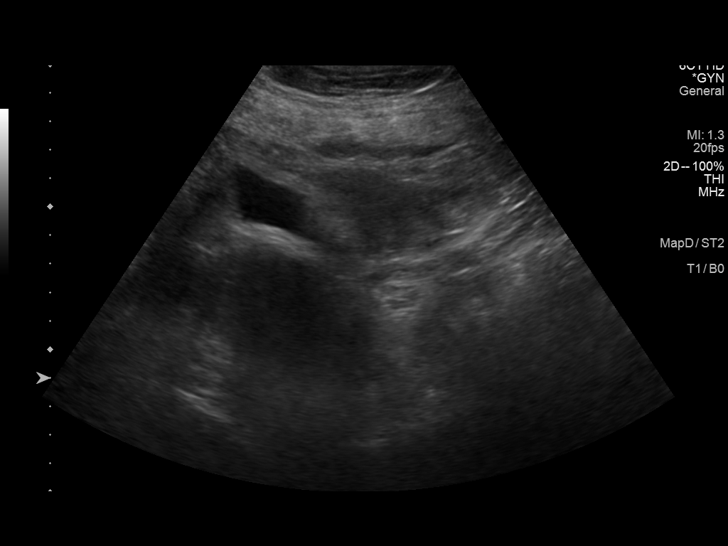
[im 22/64]
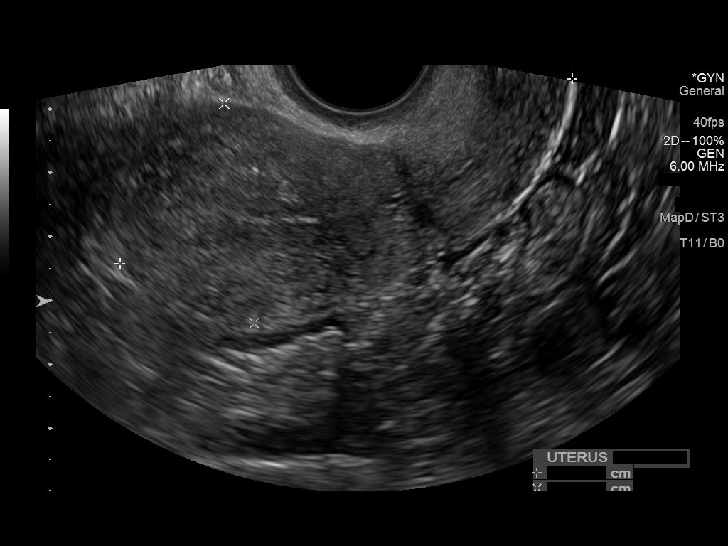
[im 27/64]
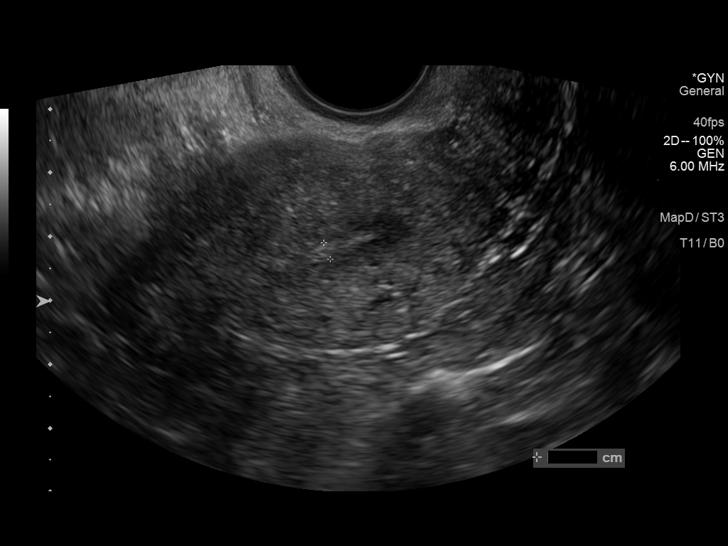
[im 32/64]
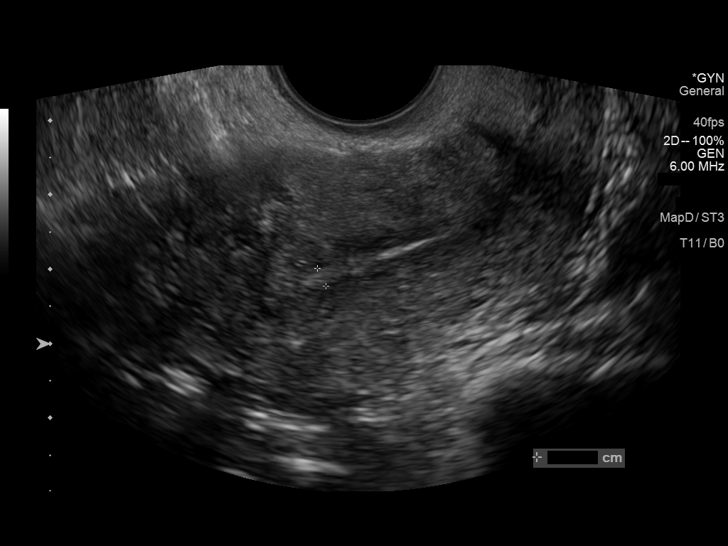
[im 37/64]
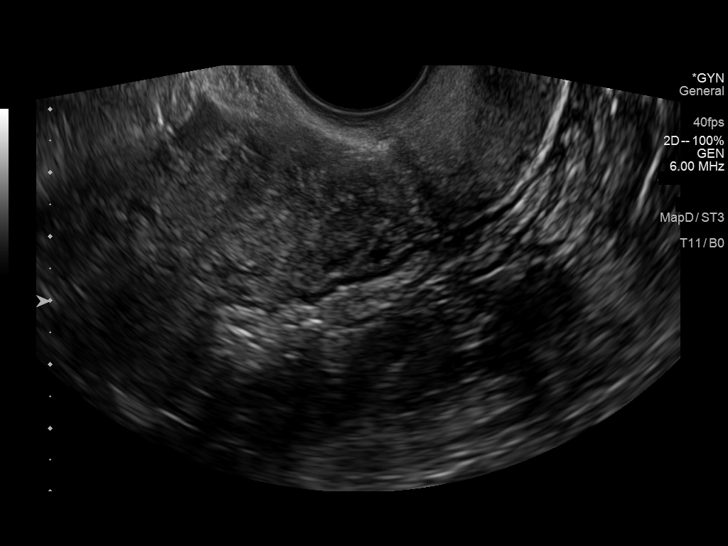
[im 43/64]
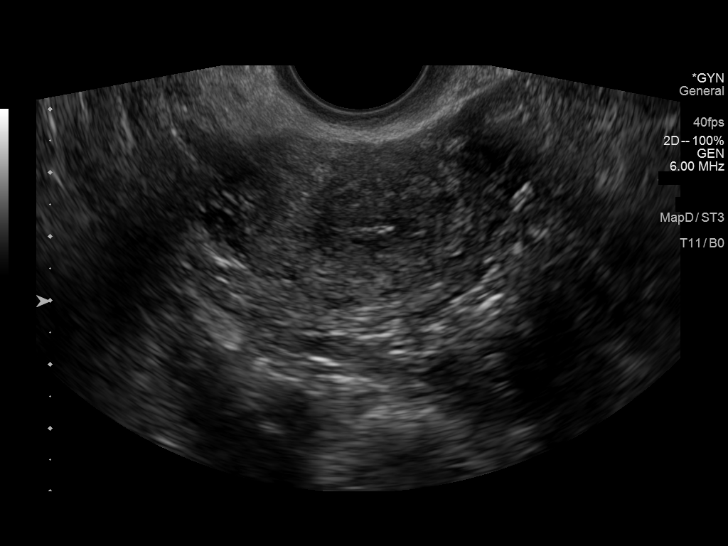
[im 48/64]
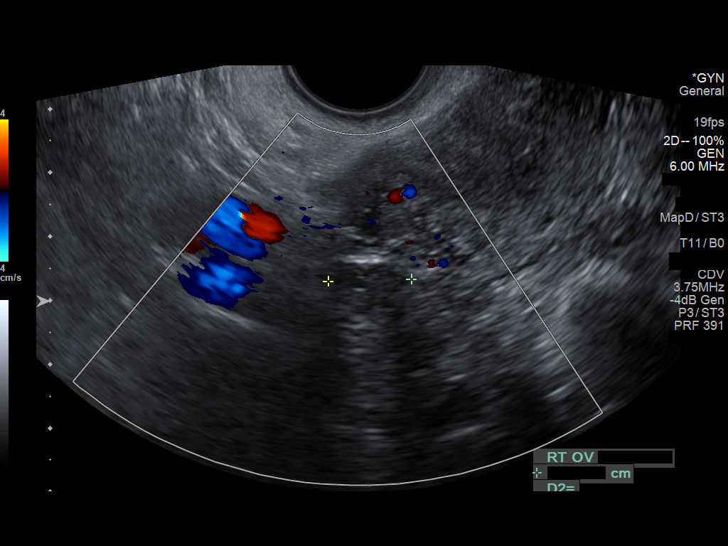
[im 53/64]
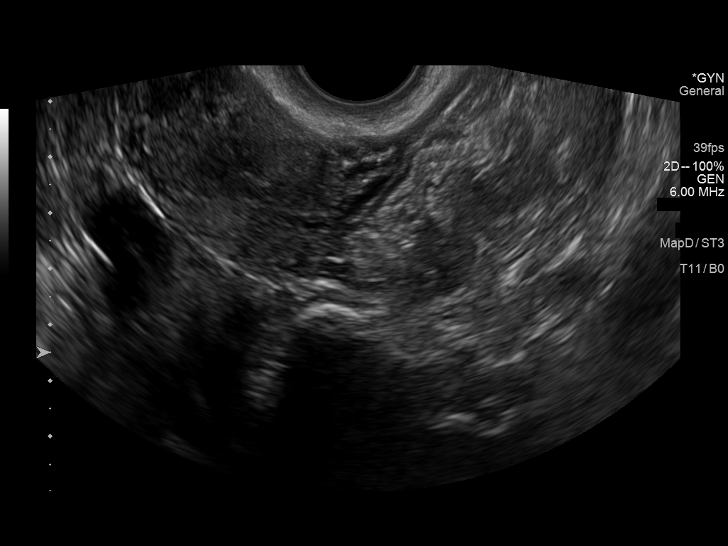
[im 58/64]
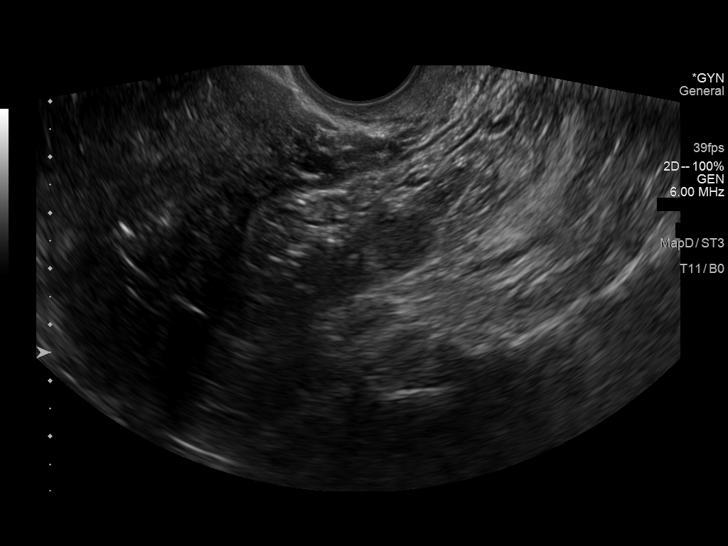
[im 64/64]
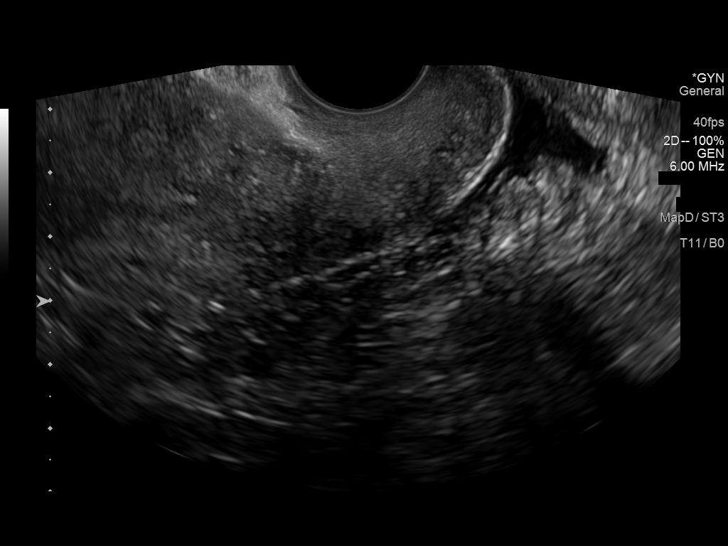

[13 of 25 positions shown; findings below may reference images not displayed]

FINDINGS: Uterus

Measurements: 7.7 x 3.5 x 5 cm = volume: 69 mL. No fibroids or other
mass visualized.

Endometrium

Thickness: 2.8 mm.  No focal abnormality visualized.

Right ovary

Measurements: 1.1 x 1.1 x 1.3 cm = volume: 0.8 mL. Normal
appearance/no adnexal mass.

Left ovary

Measurements: 2.5 x 1.2 x 1.9 cm = volume: 3 mL. Normal
appearance/no adnexal mass.

Other findings

No abnormal free fluid.
IMPRESSION: 1. Endometrial thickness of 2.8 mm. In the setting of
post-menopausal bleeding, this is consistent with a benign etiology
such as endometrial atrophy. If bleeding remains unresponsive to
hormonal or medical therapy, sonohysterogram should be considered
for focal lesion work-up. (Ref: Radiological Reasoning: Algorithmic
Workup of Abnormal Vaginal Bleeding with Endovaginal Sonography and
Sonohysterography. AJR [id number]; 191:S68-73)
2. Previously noted fibroids are not demonstrated today

## 2022-05-01 ENCOUNTER — Telehealth (HOSPITAL_COMMUNITY): Payer: Self-pay | Admitting: Licensed Clinical Social Worker

## 2022-05-01 NOTE — Telephone Encounter (Signed)
Advanced Heart Failure Patient Advocate Encounter   Patient was approved to receive Opsumit from [employer]  Effective dates: 05/01/22 through 10/29/22  Reached out to [place] to request an update on the status of Uptravi. They received the application but need to open a new case to also cover the Uptravi. Will follow up later this week to ask about shipment and coverage. The Opsumit should be delivered by the end of the week.  Called to update the patient. Left vm.

## 2022-05-01 NOTE — Telephone Encounter (Signed)
Advanced Heart Failure Patient Advocate Encounter   Patient was approved to receive Uptravi from [employer]  Patient ID: [id number] Effective dates: 05/01/22 through 10/29/22

## 2022-05-01 NOTE — Progress Notes (Unsigned)
Heart and Vascular Care Navigation  05/01/2022  Dawn Fowler 05/15/1966 [id number]  Reason for Referral: Concerns with paying rent   Engaged with patient by telephone for initial visit for Heart and Vascular Care Coordination.                                                                                                   Assessment: Pt reports she does not have rent for July- rent is $1,153/mont.  Only getting unemployment benefits at this time which is about $250/week but reports she is looking for work.  Has looked into other assistance options but has to be up for eviction to access funds.  CSW discussed patient care fund fund referral and required documents                                  HRT/VAS [place] Paramedic Name: [facility] 02/03/21 as no longer needing services   Social Worker Name: Tammy Sours- [facility]- [phone]   Living arrangements for the past 2 months Apartment   Lives with: Self   Patient Current Insurance Coverage Managed Medicare   Does Patient Have Prescription Coverage? Yes   Patient Prescription Assistance Programs Patient Assistance Programs   Patient Assistance Programs Medications application sent in for [employer] for eliquis assistance on 01/04/21   Other Assistance Programs Medications approved for Partial Extra Help program on 12/14/20   Home Assistive Devices/Equipment Oxygen; CPAP   Current home services DME  Has 02 via [place] and CPAP- Rollator.       Social History:                                                                             SDOH Screenings   Alcohol Screen: Not on file  Depression (TIR4-4): Not on file  Financial Resource Strain: High Risk (04/10/2022)   Overall Financial Resource Strain (CARDIA)    Difficulty of Paying Living Expenses: Very hard  Food Insecurity: Food Insecurity Present (04/11/2022)   Hunger Vital Sign    Worried About Running Out of  Food in the Last Year: Sometimes true    Ran Out of Food in the Last Year: Never true  Housing: Medium Risk (04/10/2022)   Housing    Last Housing Risk Score: 1  Physical Activity: Not on file  Social Connections: Not on file  Stress: Not on file  Tobacco Use: Low Risk  (03/24/2022)   Patient History    Smoking Tobacco Use: Never    Smokeless Tobacco Use: Never    Passive Exposure: Not on file  Recent Concern: Tobacco Use - Medium Risk (02/17/2022)   Patient History  Smoking Tobacco Use: Former    Smokeless Tobacco Use: Never    Passive Exposure: Not on file  Transportation Needs: No Transportation Needs (11/11/2020)   PRAPARE - Hydrologist (Medical): No    Lack of Transportation (Non-Medical): No    SDOH Interventions: Financial Resources:    Patient care fund  Food Insecurity:  None reported  Housing Insecurity:  Worried about paying rent  Transportation:   None reported   Follow-up plan:    Pt getting together required documents for patient care fund referral.  Jorge Ny, [place] Worker [facility] Desk#: [phone] Cell#: [phone]

## 2022-05-03 ENCOUNTER — Telehealth (HOSPITAL_COMMUNITY): Payer: Self-pay | Admitting: Licensed Clinical Social Worker

## 2022-05-04 NOTE — Telephone Encounter (Signed)
Check request to assist with rent submitted for processing- pt informed  Jorge Ny, [facility] Desk#: [phone] Cell#: [phone]

## 2022-05-05 NOTE — Telephone Encounter (Signed)
Advanced Heart Failure Patient Advocate Encounter   Patient was approved to receive Eliquis from [employer] through 10/29/22.  [name], CPhT

## 2022-05-05 NOTE — Telephone Encounter (Signed)
Patient confirmed Uptravi delivery. No further action needed at this time.  [name], CPhT

## 2022-05-11 ENCOUNTER — Other Ambulatory Visit (HOSPITAL_COMMUNITY): Payer: Self-pay | Admitting: *Deleted

## 2022-05-19 ENCOUNTER — Telehealth (HOSPITAL_COMMUNITY): Payer: Self-pay | Admitting: Licensed Clinical Social Worker

## 2022-05-19 NOTE — Telephone Encounter (Signed)
Pt requesting help with getting letter for why she can't participate in jury duty.  CSW assisted in getting letter signed for pt detailing medical restrictions.  Pt informed and will plan to pick up letter and turn in.  Jorge Ny, [place] Worker [facility] Desk#: [phone] Cell#: [phone]

## 2022-05-26 ENCOUNTER — Encounter (HOSPITAL_COMMUNITY): Payer: Medicare Other | Admitting: Cardiology

## 2022-06-01 ENCOUNTER — Telehealth (HOSPITAL_COMMUNITY): Payer: Self-pay | Admitting: Licensed Clinical Social Worker

## 2022-06-01 NOTE — Progress Notes (Signed)
Heart and Vascular Care Navigation  06/01/2022  Dawn Fowler 1966-01-27 [id number]  Reason for Referral: financial strain   Engaged with patient by telephone for follow up visit for Heart and Vascular Care Coordination.                                                                                                   Assessment:         Pt called CSW to discuss ongoing financial strain and inability to pay rent.  CSW had assisted pt with rent last month and plan had been for pt to obtain a roommate and to find employment while she continues to try to reinstate SSDI benefits.  Pt currently only has small unemployment income (about $570/month) which is about to end.  Pt has obtained a roommate who she used to work with but who was also laid off in May- they have both been searching for a job but have not found anything at this time.    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  Pt agreeable to CSW making referrals on [employer] for job assistance and rent/utility assistance.  Pt has utilized limit for [place] assistance but possibly can apply for [employer] assistance- will have to send in application once discussed with supervisor.                                HRT/VAS Care Coordination     Outpatient Care Team Community Paramedicine   Community Paramedic Name: [facility] 02/03/21 as no longer needing services   Social Worker Name: Tammy Sours- [facility]- [phone]   Living arrangements for the past 2 months Apartment   Lives with: Roommate   Patient Current Insurance Coverage Self-Pay   Does Patient Have Prescription Coverage? Yes   Patient Prescription Assistance Programs Heart Failure Fund   Patient Assistance Programs Medications application sent in for [employer] for eliquis assistance on 01/04/21   Other Assistance Programs Medications  approved for Partial Extra Help program on 12/14/20   Home Assistive Devices/Equipment Oxygen; CPAP   Current home services DME  Has 02 via Manuel Garcia and CPAP- Rollator.       Social History:                                                                             SDOH Screenings   Alcohol Screen: Not on file  Depression (LKG4-0): Not on file  Financial Resource Strain: High Risk (04/10/2022)   Overall Financial Resource Strain (CARDIA)    Difficulty of Paying Living Expenses: Very hard  Food  Insecurity: Food Insecurity Present (04/11/2022)   Hunger Vital Sign    Worried About Running Out of Food in the Last Year: Sometimes true    Ran Out of Food in the Last Year: Never true  Housing: High Risk (06/01/2022)   Housing    Last Housing Risk Score: 2  Physical Activity: Not on file  Social Connections: Not on file  Stress: Not on file  Tobacco Use: Low Risk  (03/24/2022)   Patient History    Smoking Tobacco Use: Never    Smokeless Tobacco Use: Never    Passive Exposure: Not on file  Recent Concern: Tobacco Use - Medium Risk (02/17/2022)   Patient History    Smoking Tobacco Use: Former    Smokeless Tobacco Use: Never    Passive Exposure: Not on file  Transportation Needs: No Transportation Needs (11/11/2020)   PRAPARE - Transportation    Lack of Transportation (Medical): No    Lack of Transportation (Non-Medical): No    SDOH Interventions: Financial Resources:    Unemployment income (bout to end), pending SSDI  Food Insecurity:   Working on [place]:  Housing Interventions: [id number] Referral  Transportation:    Not assessed   Follow-up plan:   CSW will discuss [employer] referral with [place] tomorrow.  Pt will follow up with [facility] regarding possible rent/utility assistance.  Pt will continue to search for job and will inquire with her church about assistance with bill as they helped some last month.  CSW will continue to follow and assist as  needed  Jorge Ny, [facility] Desk#: [phone] Cell#: [phone]

## 2022-06-02 ENCOUNTER — Telehealth (HOSPITAL_COMMUNITY): Payer: Self-pay | Admitting: Pharmacy Technician

## 2022-06-02 ENCOUNTER — Telehealth (HOSPITAL_COMMUNITY): Payer: Self-pay | Admitting: Licensed Clinical Social Worker

## 2022-06-02 ENCOUNTER — Other Ambulatory Visit (HOSPITAL_COMMUNITY): Payer: Self-pay

## 2022-06-02 NOTE — Telephone Encounter (Signed)
H&V Care Navigation CSW Progress Note  Clinical Social Worker following up on patient financial assistance from yesterday  CSW sent in referral to [facility] to see if pt is eligible for assistance through this program to help with this months rent- awaiting response.  CSW spoke with pt about possibility that she has exhausted current options and that she might need to consider an alternative plan if she can't continue to live where she is at.  Has a son who lives in [place] but pt reports she doesn't think this would work out.  CSW encouraged pt to start thinking of other possibilities because due to pt high living expenses I have concerns about the sustainability of her living situation even if we get Joya Gaskins fund to pay for this month.  Pt expressed understanding.  SDOH Screenings   Alcohol Screen: Not on file  Depression (FYB0-1): Not on file  Financial Resource Strain: High Risk (04/10/2022)   Overall Financial Resource Strain (CARDIA)    Difficulty of Paying Living Expenses: Very hard  Food Insecurity: Food Insecurity Present (04/11/2022)   Hunger Vital Sign    Worried About Running Out of Food in the Last Year: Sometimes true    Ran Out of Food in the Last Year: Never true  Housing: High Risk (06/01/2022)   Housing    Last Housing Risk Score: 2  Physical Activity: Not on file  Social Connections: Not on file  Stress: Not on file  Tobacco Use: Low Risk  (03/24/2022)   Patient History    Smoking Tobacco Use: Never    Smokeless Tobacco Use: Never    Passive Exposure: Not on file  Recent Concern: Tobacco Use - Medium Risk (02/17/2022)   Patient History    Smoking Tobacco Use: Former    Smokeless Tobacco Use: Never    Passive Exposure: Not on file  Transportation Needs: No Transportation Needs (11/11/2020)   PRAPARE - Transportation    Lack of Transportation (Medical): No    Lack of Transportation (Non-Medical): No    Jorge Ny, LCSW Clinical Social Worker Advanced Heart Failure  Clinic Desk#: [phone] Cell#: [phone]

## 2022-06-02 NOTE — Telephone Encounter (Signed)
Patient Advocate Encounter   Received notification from [facility] that prior authorization for Tadalafil [facility]) is required.   PA submitted on CoverMyMeds Key  [id number] Status is pending   Will continue to follow.

## 2022-06-05 ENCOUNTER — Other Ambulatory Visit (HOSPITAL_COMMUNITY): Payer: Self-pay

## 2022-06-05 ENCOUNTER — Telehealth: Payer: Self-pay | Admitting: Licensed Clinical Social Worker

## 2022-06-05 ENCOUNTER — Telehealth (HOSPITAL_COMMUNITY): Payer: Self-pay | Admitting: Pharmacy Technician

## 2022-06-05 NOTE — Telephone Encounter (Signed)
Patient Advocate Encounter   Received notification from [facility] that prior authorization for Dawn Fowler is required.   PA submitted on CoverMyMeds Key [id number] Status is pending   Will continue to follow.

## 2022-06-05 NOTE — Telephone Encounter (Signed)
Advanced Heart Failure Patient Advocate Encounter  Prior Authorization for Dawn Fowler 883mg has been approved.    PA# [id number] dates: 05/06/22 through 06/05/23  Dawn Fowler CPhT

## 2022-06-05 NOTE — Telephone Encounter (Signed)
Advanced Heart Failure Patient Advocate Encounter  Prior Authorization for [facility]) has been approved.    PA# [id number] Effective dates: 05/03/22 through 06/02/23  Patients co-pay is $4.15 (90 days)  [name], CPhT

## 2022-06-05 NOTE — Telephone Encounter (Signed)
H&V Care Navigation CSW Progress Note  Clinical Social Worker contacted patient by phone to f/u on requested items from Patient [place]. LCSW was able to reach pt at [phone]. Introduced self, role, and reason for call. Eliezer Lofts has been working with pt on submitted [employer] request. Pt will provide proof of income by sending me letter of unemployment benefits. She has been sent letter of agreement for fund, and I am awaiting letter from [place] on [employer] to complete request from [employer].   Patient is participating in a Managed Medicaid Plan:  No, UHC Medicare  SDOH Screenings   Alcohol Screen: Not on file  Depression (VEL3-8): Not on file  Financial Resource Strain: High Risk (04/10/2022)   Overall Financial Resource Strain (CARDIA)    Difficulty of Paying Living Expenses: Very hard  Food Insecurity: Food Insecurity Present (04/11/2022)   Hunger Vital Sign    Worried About Running Out of Food in the Last Year: Sometimes true    Ran Out of Food in the Last Year: Never true  Housing: High Risk (06/01/2022)   Housing    Last Housing Risk Score: 2  Physical Activity: Not on file  Social Connections: Not on file  Stress: Not on file  Tobacco Use: Low Risk  (03/24/2022)   Patient History    Smoking Tobacco Use: Never    Smokeless Tobacco Use: Never    Passive Exposure: Not on file  Recent Concern: Tobacco Use - Medium Risk (02/17/2022)   Patient History    Smoking Tobacco Use: Former    Smokeless Tobacco Use: Never    Passive Exposure: Not on file  Transportation Needs: No Transportation Needs (11/11/2020)   PRAPARE - Transportation    Lack of Transportation (Medical): No    Lack of Transportation (Non-Medical): No   Westley Hummer, MSW, [place]  [phone]- work cell phone (preferred) [phone]- desk phone

## 2022-06-06 ENCOUNTER — Telehealth: Payer: Self-pay | Admitting: Licensed Clinical Social Worker

## 2022-06-06 ENCOUNTER — Ambulatory Visit: Payer: Self-pay | Admitting: Pulmonary Disease

## 2022-06-06 NOTE — Telephone Encounter (Signed)
H&V Care Navigation CSW Progress Note  Clinical Social Worker  was contacted by pt  to f/u with this Probation officer about previous documents sent to colleague. Pt at [place] trying to renew her application for SNAP- she needs a document noting where her disability benefits ended. I shared that I wasn't sure if Eliezer Lofts had that information as she is out of the office this week. Encouraged her to look in sent box of email to see if she had emailed it, if not [place] will return to office next week and should be able to look at past communications with pt. I remain available, continue to process request for [facility].   Patient is participating in a Managed Medicaid Plan:  No, UHC Medicare only  SDOH Screenings   Alcohol Screen: Not on file  Depression (KGS8-1): Not on file  Financial Resource Strain: High Risk (04/10/2022)   Overall Financial Resource Strain (CARDIA)    Difficulty of Paying Living Expenses: Very hard  Food Insecurity: Food Insecurity Present (04/11/2022)   Hunger Vital Sign    Worried About Running Out of Food in the Last Year: Sometimes true    Ran Out of Food in the Last Year: Never true  Housing: High Risk (06/01/2022)   Housing    Last Housing Risk Score: 2  Physical Activity: Not on file  Social Connections: Not on file  Stress: Not on file  Tobacco Use: Low Risk  (03/24/2022)   Patient History    Smoking Tobacco Use: Never    Smokeless Tobacco Use: Never    Passive Exposure: Not on file  Recent Concern: Tobacco Use - Medium Risk (02/17/2022)   Patient History    Smoking Tobacco Use: Former    Smokeless Tobacco Use: Never    Passive Exposure: Not on file  Transportation Needs: No Transportation Needs (11/11/2020)   PRAPARE - Transportation    Lack of Transportation (Medical): No    Lack of Transportation (Non-Medical): No   Westley Hummer, MSW, [place]  [phone]- work cell phone  (preferred) [phone]- desk phone

## 2022-06-07 ENCOUNTER — Encounter (INDEPENDENT_AMBULATORY_CARE_PROVIDER_SITE_OTHER): Payer: Self-pay

## 2022-06-14 ENCOUNTER — Telehealth: Payer: Self-pay | Admitting: Licensed Clinical Social Worker

## 2022-06-14 NOTE — Telephone Encounter (Signed)
H&V Care Navigation CSW Progress Note  Clinical Social Worker  was contacted via text by pt  to f/u on assistance for rent.  Pt texted "good afternoon Michiel Cowboy, this is Dawn Fowler. I am just following up on everything. I didn't get a copy of the promise letter that you sent to my housing office. May I have a copy please."   LCSW clarified that we do not provide a "promise letter." But rather I had called and emailed with Omelia Blackwater, Sport and exercise psychologist, that we would be able to $1,000 assistnace. Pt asked for copy of the emails sent to [facility]. I offered to send her a letter with the communications and dates. It stated the following- this was securely emailed to pt and [place], LCSW.   "Patient Dawn Fowler requested assistance with rent for this month (August 2023).  Request for assistance through [place] was submitted by Jeri Cos at [facility] clinic, to [place] on Friday 06/02/22.   Eliezer Lofts was out of the office the following week, the [employer] responded back with their offer to help on Monday 06/05/2022.   Pt was contacted by this Probation officer to obtain letter of agreement and proof of income on Monday 06/05/2022- received the proof of income same day, received letter of agreement on 06/07/2022.  I also called [place] at [employer] on 06/05/2022 and spoke with Omelia Blackwater (Sport and exercise psychologist). Requested copy of W9. This was received 06/06/2022.  This W9 and additional paperwork (proof of income) was submitted to [facility], and it was formally approved on 06/06/2022. Check request was submitted at that time for assistance in the amount of $1,000 towards rent. I notified Lyndee Leo morning of 06/07/2022 that we would be aiding and requested lease for fund records. She responded to me on 06/08/2022 with lease and let me know that they would be able to accept payment through the check to their office. She did update me that a late fee had been levied 8/6 since rent was  not paid by that time.   I was contacted by pt on 06/13/22 requesting this information. I am still awaiting check currently to submit to leasing agency.      Westley Hummer, MSW, LCSW Heart and Vascular Care Navigation"  As of 8/16 we are still waiting for check from [employer].   Patient is participating in a Managed Medicaid Plan:  No, UHC Medicare   SDOH Screenings   Alcohol Screen: Not on file  Depression (SWN4-6): Not on file  Financial Resource Strain: High Risk (04/10/2022)   Overall Financial Resource Strain (CARDIA)    Difficulty of Paying Living Expenses: Very hard  Food Insecurity: Food Insecurity Present (04/11/2022)   Hunger Vital Sign    Worried About Running Out of Food in the Last Year: Sometimes true    Ran Out of Food in the Last Year: Never true  Housing: High Risk (06/01/2022)   Housing    Last Housing Risk Score: 2  Physical Activity: Not on file  Social Connections: Not on file  Stress: Not on file  Tobacco Use: Low Risk  (03/24/2022)   Patient History    Smoking Tobacco Use: Never    Smokeless Tobacco Use: Never    Passive Exposure: Not on file  Recent Concern: Tobacco Use - Medium Risk (02/17/2022)   Patient History    Smoking Tobacco Use: Former    Smokeless Tobacco Use: Never    Passive Exposure: Not on file  Transportation Needs: No Transportation Needs (11/11/2020)   PRAPARE - Hydrologist (Medical): No    Lack of Transportation (Non-Medical): No   Westley Hummer, MSW, [place]  [phone]- work cell phone (preferred) [phone]- desk phone

## 2022-06-26 ENCOUNTER — Ambulatory Visit: Payer: Self-pay | Admitting: Pulmonary Disease

## 2022-07-12 ENCOUNTER — Encounter (HOSPITAL_COMMUNITY): Payer: Self-pay | Admitting: Cardiology

## 2022-07-12 ENCOUNTER — Telehealth: Payer: Self-pay | Admitting: Pulmonary Disease

## 2022-07-12 ENCOUNTER — Telehealth (HOSPITAL_COMMUNITY): Payer: Self-pay | Admitting: Licensed Clinical Social Worker

## 2022-07-12 NOTE — Telephone Encounter (Signed)
H&V Care Navigation CSW Progress Note  Clinical Social Worker received call from pt who reports she was denied for her disability.  She is planning on appealing the decision but in the meantime feels as if she needs to see the doctors but cannot afford to.  CSW discussed applying for CAFA with pt and sent her an application for this and the [place] to get assistance with seeing local physicians.      SDOH Screenings   Food Insecurity: Food Insecurity Present (04/11/2022)  Housing: High Risk (06/01/2022)  Transportation Needs: No Transportation Needs (11/11/2020)  Financial Resource Strain: High Risk (07/12/2022)  Tobacco Use: Low Risk  (03/24/2022)  Recent Concern: Tobacco Use - Medium Risk (02/17/2022)    Jorge Ny, LCSW Clinical Social Worker [facility] Desk#: [phone] Cell#: [phone]

## 2022-07-12 NOTE — Telephone Encounter (Signed)
Patient requested that the nurse call her regarding some issues she is having with her disability. Please call patient to discuss at [phone]

## 2022-07-20 ENCOUNTER — Ambulatory Visit: Payer: Self-pay | Admitting: Pulmonary Disease

## 2022-07-21 ENCOUNTER — Telehealth: Payer: Self-pay | Admitting: Licensed Clinical Social Worker

## 2022-07-21 NOTE — Telephone Encounter (Signed)
H&V Care Navigation CSW Progress Note  Clinical Social Worker  was contacted by pt  to address issue with refilling her torsemide. She shares that she called and was told by pharmacy that she was filling it too early and it would cost her to do so. LCSW shared pt should call clinic and see if the CMA/RN staff can assist with contacting the pharmacy regarding this concern. If they are not able to resolve it LCSW team can try and assist. Pt confirmed she has number for clinic and will call.   Patient is participating in a Managed Medicaid Plan:  No, UHC Medicare only.   SDOH Screenings   Food Insecurity: Food Insecurity Present (04/11/2022)  Housing: High Risk (06/01/2022)  Transportation Needs: No Transportation Needs (11/11/2020)  Financial Resource Strain: High Risk (07/12/2022)  Tobacco Use: Low Risk  (03/24/2022)  Recent Concern: Tobacco Use - Medium Risk (02/17/2022)   Westley Hummer, MSW, [place]  [phone]- work cell phone (preferred) [phone]- desk phone

## 2022-08-04 ENCOUNTER — Encounter: Payer: Self-pay | Admitting: Nurse Practitioner

## 2022-08-04 ENCOUNTER — Ambulatory Visit (INDEPENDENT_AMBULATORY_CARE_PROVIDER_SITE_OTHER): Payer: Self-pay | Admitting: Nurse Practitioner

## 2022-08-04 VITALS — BP 130/90 | HR 85 | Temp 97.8°F | Ht 61.0 in | Wt 238.0 lb

## 2022-08-04 DIAGNOSIS — K529 Noninfective gastroenteritis and colitis, unspecified: Secondary | ICD-10-CM

## 2022-08-04 DIAGNOSIS — G4733 Obstructive sleep apnea (adult) (pediatric): Secondary | ICD-10-CM

## 2022-08-04 DIAGNOSIS — J984 Other disorders of lung: Secondary | ICD-10-CM

## 2022-08-04 DIAGNOSIS — I272 Pulmonary hypertension, unspecified: Secondary | ICD-10-CM

## 2022-08-04 DIAGNOSIS — J9611 Chronic respiratory failure with hypoxia: Secondary | ICD-10-CM

## 2022-08-04 NOTE — Assessment & Plan Note (Signed)
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 No a transplant candidate d/t BMI.  Recently had some issues with volume overload due to running out of her medicines.  Since restarting, she has been back to her normal weight and has not had any further issues with swelling.  She is euvolemic today.  Her weight is within 3 pounds of when she was here last in May 2023.  She will continue with her regimen of Opsumit, Uptravi, tadalafil, torsemide and spironolactone.   Patient Instructions  -Restart Breo Ellipta take 1 puff daily in the morning -Nebs nebulizer every 6 hours as needed for breakthrough shortness of breath or wheezing -Continue Opsumit 10 mg daily -Continue Uptravi 800 mcg 1 tablet twice daily -Continue tadalafil 20 mg 2 times daily -Continue torsemide 40 mg twice daily -Continue spironolactone 12.5 mg daily -Continue potassium supplement -Continue Eliquis 5 mg twice daily -Continue supplemental oxygen 4 lpm with rest and up to 8 lpm with activity for goal >88-90% Continue CPAP nightly, minimum 4-6 hours   Referral to GI for further evaluation  Try over the counter lactaid prior to eating dairy   Encourage you to exercise as much as possible.   Pulmonary function testing order   Follow up after PFTs with Dr. Loanne Drilling. If symptoms do not improve or worsen, please contact office for sooner follow up or seek emergency care.

## 2022-08-04 NOTE — Assessment & Plan Note (Signed)
Stable without any increased O2 requirement.  She does require high levels of oxygen with activity, which has been baseline for her for the past year.  Uses 4 L/min with rest and up to 8 L with activity.  Reports that her oxygen levels have been above 88% at home on this.  Goal greater than 88-90%

## 2022-08-04 NOTE — Progress Notes (Signed)
$'@Patient't$  ID: Dawn Fowler, female    DOB: Aug 26, 1966, 56 y.o.   MRN: [id number]  Chief Complaint  Patient presents with   Follow-up    Referring provider: Hayden Rasmussen, MD  HPI: 56 year old female, never smoker followed for restrictive lung disease, pulmonary hypertension, chronic respiratory failure on supplemental O2, OSA on CPAP. She is a patient of Dr. Cordelia Pen and last seen in office on 03/24/2022 by Volanda Napoleon NP. Past medical history significant for diastolic CHF, RVF, CKD stage 3, obesity, gout.   TEST/EVENTS:  12/21/2019 HRCT chest: No evidence of ILD.  Findings concerning for small vessel disease.  There is an enlarged pulmonic trunk. 12/18/2019 cardiac cath: RV 87/21, PA 89/33 with mean 54, PCWP mean 34, CO 5.17, CI 2.22, PVR 3.87 12/21/2019 PFT: FVC 42, FEV1 46, ratio 89, TLC 64, DLCOcor 31 03/08/2021 echocardiogram: EF 55%.  G1 DD.  RV function mildly reduced in size mildly enlarged.  Moderately elevated PASP.  03/24/2022: OV with Volanda Napoleon NP.  Overdue follow-up.  Called to schedule visit because she is not feeling well and is more short of breath.  Reports trouble walking and difficulty doing household tasks.  She is deconditioned, not getting much activity.  Using rolling walker to get around.  Experiencing chest tightness with exertion.  Not using Breo consistently.  She is on 4 L of oxygen at rest and up to 8 L with exertion.  Compliant with CPAP use blended with oxygen.  Working on weight loss, down 15 pounds.  She is taking Opsumit, tadalafil, Uptravi, torsemide and spironolactone as directed.  Actually looked pretty well upon exam.  No evidence of volume overload.  Recommended that she resume Breo and continue DuoNeb every 6 hours for breakthrough shortness of breath/wheezing.  Continue to work on weight loss measures.  Continue CPAP nightly.  Unable to qualify for POC.  Desaturated to 86% on 4 L pulsed.  Unable to pursue pulmonary rehab at this time.  Placing order for PT home eval  and treatment.  08/04/2022: Today - follow up Patient presents today for overdue follow up. She feels like her breathing has slowly declined over the past year.  Just feels like she gets more short winded with activity.  Feels like she does not have the stamina to do much.  She struggles with household tasks and has trouble with walking.  She uses a rolling walker to help get around.  Did have some improvement in her strength with physical therapy.  Has not been engaging in these exercises regularly since PT stopped coming though.  No acute worsening in her respiratory status.  Feels okay today.  She did notice a few weeks ago, she had some increased weight gain because the pharmacy did not have her prescriptions ready for her.  She felt like she was little more short of breath then but then was able to get back on her medicines and recovered from this quickly.  Weight has been stable since and she has not noticed any leg swelling.  She denies any significant cough, chest congestion, wheezing.  No recent fevers, chills, hemoptysis, orthopnea.  She is not using Breo consistently.  Does not ever use her SABA.  She is compliant with Opsumit, Uptravi, tadalafil, torsemide and spironolactone.  She is also on Eliquis twice daily which she is never missed a dose of.  She is on 4 L/min at rest and up to 8 L/min with activity, which has been baseline for her since earlier this  year.  She wears her CPAP nightly without fail.  She is currently dealing with a lot of stress and anxiety/depression.  She recently was told that her disability was going to be canceled and so she is now without any income.  She is working on an appeal process for this.  She is going to move in with her son for the next 30 days but was told that after this, she will have to find somewhere else to live.  She is worried about what she will do regarding her living situation.  She is working with her PCP on her mental health who recently just increased  her Zoloft.  Denies SI/HI.  Allergies  Allergen Reactions   Adhesive [Tape] Hives    Tolerates paper tape   Succinylcholine Other (See Comments)    Have trouble waking up   Tizanidine Hives    Immunization History  Administered Date(s) Administered   Fluad Quad(high Dose 65+) 09/20/2020   Influenza Whole 08/20/2019   Influenza,inj,Quad PF,6+ Mos 06/25/2018   PFIZER(Purple Top)SARS-COV-2 Vaccination 02/02/2020, 02/24/2020, 09/27/2020   Pneumococcal Polysaccharide-23 07/17/2014    Past Medical History:  Diagnosis Date   Acute respiratory failure due to COVID-19 ([place]) 10/30/2019   Anxiety    Arthritis    CHF (congestive heart failure) (HCC)    CKD (chronic kidney disease), stage III (HCC)    Complication of anesthesia    hard to wake up after general anesthesia - had trouble waking up after succinylcholine (consider possibility of pseudocholinesterase deficiency)   COVID-19 virus detected 11/20/2019   Tested positive for Covid in December/2020 Required hospitalization in December/2020 through January/2021 Received Redemsivir   Depression    DVT (deep venous thrombosis) (HCC) 06/09/2020   RLE   Dyspnea    Family history of adverse reaction to anesthesia    hard to wake up after general anesthesia   GERD (gastroesophageal reflux disease)    HLD (hyperlipidemia)    HTN (hypertension)    Pre-diabetes    no meds   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; 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     allopurinol (ZYLOPRIM) 100 MG tablet Take 1 tablet (100 mg total) by mouth daily. 30 tablet 2   apixaban (ELIQUIS) 5 MG TABS tablet Take 1 tablet (5 mg total) by mouth 2 (two) times daily. 60 tablet 9   Calcium Carbonate Antacid (TUMS E-X PO) Take 1-2 tablets by mouth 3 (three) times daily as needed (gas/abdominal pain.).     Cholecalciferol (VITAMIN D3) 125 MCG (5000 UT) CAPS Take 5,000 Units by mouth in the morning.     colchicine 0.6 MG tablet Take 0.6 mg by mouth daily as needed (gout flare).     dapagliflozin propanediol (FARXIGA) 10 MG TABS tablet Take 1 tablet (10 mg total) by mouth daily before breakfast. 30 tablet 11   diclofenac Sodium (VOLTAREN) 1 % GEL Apply 2 g topically 3 (three) times daily  as needed (pain.).     diphenhydramine-acetaminophen (TYLENOL PM) 25-500 MG TABS tablet Take 2 tablets by mouth at bedtime as needed (sleep).     fluticasone furoate-vilanterol (BREO ELLIPTA) 100-25 MCG/ACT AEPB Inhale 1 puff into the lungs daily. 30 each 5   gabapentin (NEURONTIN) 100 MG capsule Take 3 capsules by mouth at bedtime.     ipratropium-albuterol (DUONEB) 0.5-2.5 (3) MG/3ML SOLN Take 3 mLs by nebulization every 6 (six) hours as needed (wheezing/shortness of breath).     macitentan (OPSUMIT) 10 MG tablet Take 1 tablet (10 mg total) by mouth daily. 90 tablet 3   OXYGEN Inhale 2-8 L into the lungs continuous.     potassium chloride SA (KLOR-CON M) 20 MEQ tablet Take 20 mEq by mouth daily.     sertraline (ZOLOFT) 50 MG tablet Take 1 tablet (50 mg total) by mouth daily. 30 tablet 2   spironolactone (ALDACTONE) 25 MG tablet TAKE 1/2 TABLET(12.5 MG) BY MOUTH DAILY 45 tablet 3   tadalafil, PAH, (ADCIRCA) 20 MG tablet Take 20 mg by mouth 2 (two) times daily.     torsemide (DEMADEX) 20 MG tablet Take 2 tablets (40 mg total) by mouth 2 (two) times daily. 120 tablet 2   UPTRAVI 800 MCG TABS Take 1 tablet twice daily. 60 tablet 11   No facility-administered  medications prior to visit.     Review of Systems:   Constitutional: No weight loss or gain, night sweats, fevers, chills, +fatigue, +lassitude. HEENT: No headaches, difficulty swallowing, tooth/dental problems, or sore throat. No sneezing, itching, ear ache, nasal congestion, or post nasal drip CV:  No chest pain, orthopnea, PND, swelling in lower extremities, anasarca, dizziness, palpitations, syncope Resp: +shortness of breath with exertion. No excess mucus or change in color of mucus. No productive or non-productive. No hemoptysis. No wheezing.  No chest wall deformity GI:  No heartburn, indigestion, abdominal pain, nausea, vomiting, diarrhea, change in bowel habits, loss of appetite, bloody stools.  GU: No dysuria, change in color of urine, urgency or frequency.  No flank pain, no hematuria  MSK:  No joint pain or swelling.  No decreased range of motion.  No back pain. Neuro: No dizziness or lightheadedness.  Psych: No depression or anxiety. Mood stable.     Physical Exam:  BP (!) 130/90 (BP Location: Right Arm, Cuff Size: Large)   Pulse 85   Temp 97.8 F (36.6 C)   Ht '5\' 1"'$  (1.549 m)   Wt 238 lb (108 kg)   LMP  (LMP Unknown) Comment: pmb last 2-3 months  SpO2 95%   BMI 44.97 kg/m   GEN: Pleasant, interactive, chronically-ill appearing; morbidly obese; in no acute distress. HEENT:  Normocephalic and atraumatic. PERRLA. Sclera white. Nasal turbinates pink, moist and patent bilaterally. No rhinorrhea present. Oropharynx pink and moist, without exudate or edema. No lesions, ulcerations, or postnasal drip.  NECK:  Supple w/ fair ROM. No JVD present. No lymphadenopathy.   CV: RRR, no m/r/g, no peripheral edema. Pulses intact, +2 bilaterally. No cyanosis, pallor or clubbing. PULMONARY:  Unlabored, regular breathing. Clear bilaterally A&P w/o wheezes/rales/rhonchi. No accessory muscle use.  GI: BS present and normoactive. Soft, non-tender to palpation. No organomegaly or masses  detected.  MSK: No erythema, warmth or tenderness. No deformities or joint swelling noted.  Neuro: A/Ox3. No focal deficits noted.   Skin: Warm, no lesions or rashe Psych: Normal affect and behavior. Judgement and thought content appropriate.     Lab Results:  CBC  Component Value Date/Time   WBC 11.2 (H) 12/21/2021 2015   RBC 4.56 12/21/2021 2015   HGB 13.4 12/21/2021 2015   HCT 43.4 12/21/2021 2015   PLT 297 12/21/2021 2015   MCV 95.2 12/21/2021 2015   MCH 29.4 12/21/2021 2015   MCHC 30.9 12/21/2021 2015   RDW 16.3 (H) 12/21/2021 2015   LYMPHSABS 1.6 12/17/2019 1655   MONOABS 0.4 12/17/2019 1655   EOSABS 0.1 12/17/2019 1655   BASOSABS 0.0 12/17/2019 1655    BMET    Component Value Date/Time   NA 139 03/03/2022 0924   K 4.0 03/03/2022 0924   CL 107 03/03/2022 0924   CO2 24 03/03/2022 0924   GLUCOSE 102 (H) 03/03/2022 0924   BUN 34 (H) 03/03/2022 0924   CREATININE 1.90 (H) 03/03/2022 0924   CALCIUM 9.3 03/03/2022 0924   GFRNONAA 31 (L) 03/03/2022 0924   GFRAA 31 (L) 07/12/2020 0902    BNP    Component Value Date/Time   BNP 279.4 (H) 02/17/2022 1243     Imaging:  No results found.       Latest Ref Rng & Units 12/21/2019   10:56 AM  PFT Results  FVC-Pre L 1.25   FVC-Predicted Pre % 42   FVC-Post L 1.43   FVC-Predicted Post % 48   Pre FEV1/FVC % % 87   Post FEV1/FCV % % 89   FEV1-Pre L 1.09   FEV1-Predicted Pre % 46   FEV1-Post L 1.27   DLCO uncorrected ml/min/mmHg 7.00   DLCO UNC% % 32   DLCO corrected ml/min/mmHg 6.84   DLCO COR %Predicted % 31   DLVA Predicted % 66   TLC L 3.35   TLC % Predicted % 64   RV % Predicted % 99     No results found for: "NITRICOXIDE"      Assessment & Plan:   Pulmonary hypertension (HCC) Group II/III (Unable to rule out group 1). No a transplant candidate d/t BMI.  Recently had some issues with volume overload due to running out of her medicines.  Since restarting, she has been back to her normal  weight and has not had any further issues with swelling.  She is euvolemic today.  Her weight is within 3 pounds of when she was here last in May 2023.  She will continue with her regimen of Opsumit, Uptravi, tadalafil, torsemide and spironolactone.   Patient Instructions  -Restart Breo Ellipta take 1 puff daily in the morning -Nebs nebulizer every 6 hours as needed for breakthrough shortness of breath or wheezing -Continue Opsumit 10 mg daily -Continue Uptravi 800 mcg 1 tablet twice daily -Continue tadalafil 20 mg 2 times daily -Continue torsemide 40 mg twice daily -Continue spironolactone 12.5 mg daily -Continue potassium supplement -Continue Eliquis 5 mg twice daily -Continue supplemental oxygen 4 lpm with rest and up to 8 lpm with activity for goal >88-90% Continue CPAP nightly, minimum 4-6 hours   Referral to GI for further evaluation  Try over the counter lactaid prior to eating dairy   Encourage you to exercise as much as possible.   Pulmonary function testing order   Follow up after PFTs with Dr. Loanne Drilling. If symptoms do not improve or worsen, please contact office for sooner follow up or seek emergency care.     Restrictive lung disease Restrictive lung disease on previous PFTs.  She has Breo but does not use it consistently.  Strongly encouraged her to restart this to see if it helps with  her breathing.  I do suspect that a good portion of her DOE is related to deconditioning; however, we will obtain repeat PFTs for further evaluation given her decline over the last year.  Activity as tolerated.  Urged her to try incorporating the exercises that PT had worked with her on previously.  Continue as needed SABA.  Chronic hypoxemic respiratory failure (HCC) Stable without any increased O2 requirement.  She does require high levels of oxygen with activity, which has been baseline for her for the past year.  Uses 4 L/min with rest and up to 8 L with activity.  Reports that her oxygen  levels have been above 88% at home on this.  Goal greater than 88-90%  OSA on CPAP Excellent compliance.  Never sleeps without it.  Continues to receive good benefit from use.  Continue CPAP nightly with blended oxygen.  Chronic diarrhea Possibly related to lactose intolerance. Advised to try lactaid or avoid dairy products. Referral to GI.    I spent 42 minutes of dedicated to the care of this patient on the date of this encounter to include pre-visit review of records, face-to-face time with the patient discussing conditions above, post visit ordering of testing, clinical documentation with the electronic health record, making appropriate referrals as documented, and communicating necessary findings to members of the patients care team.  Clayton Bibles, NP 08/07/2022  Pt aware and understands NP's role.

## 2022-08-04 NOTE — Patient Instructions (Addendum)
-  Restart Breo Ellipta take 1 puff daily in the morning -Nebs nebulizer every 6 hours as needed for breakthrough shortness of breath or wheezing -Continue Opsumit 10 mg daily -Continue Uptravi 800 mcg 1 tablet twice daily -Continue tadalafil 20 mg 2 times daily -Continue torsemide 40 mg twice daily -Continue spironolactone 12.5 mg daily -Continue potassium supplement -Continue Eliquis 5 mg twice daily -Continue supplemental oxygen 4 lpm with rest and up to 8 lpm with activity for goal >88-90% Continue CPAP nightly, minimum 4-6 hours   Referral to GI for further evaluation  Try over the counter lactaid prior to eating dairy   Encourage you to exercise as much as possible.   Pulmonary function testing order   Follow up after PFTs with Dr. Loanne Drilling. If symptoms do not improve or worsen, please contact office for sooner follow up or seek emergency care.

## 2022-08-04 NOTE — Assessment & Plan Note (Addendum)
Restrictive lung disease on previous PFTs.  She has Breo but does not use it consistently.  Strongly encouraged her to restart this to see if it helps with her breathing.  I do suspect that a good portion of her DOE is related to deconditioning; however, we will obtain repeat PFTs for further evaluation given her decline over the last year.  Activity as tolerated.  Urged her to try incorporating the exercises that PT had worked with her on previously.  Continue as needed SABA.

## 2022-08-04 NOTE — Assessment & Plan Note (Signed)
Excellent compliance.  Never sleeps without it.  Continues to receive good benefit from use.  Continue CPAP nightly with blended oxygen.

## 2022-08-07 ENCOUNTER — Encounter: Payer: Self-pay | Admitting: Nurse Practitioner

## 2022-08-07 DIAGNOSIS — K529 Noninfective gastroenteritis and colitis, unspecified: Secondary | ICD-10-CM | POA: Insufficient documentation

## 2022-08-07 NOTE — Assessment & Plan Note (Signed)
Possibly related to lactose intolerance. Advised to try lactaid or avoid dairy products. Referral to GI.

## 2022-08-25 ENCOUNTER — Encounter (HOSPITAL_COMMUNITY): Payer: Self-pay | Admitting: Cardiology

## 2022-09-07 ENCOUNTER — Ambulatory Visit: Payer: Medicare Other | Admitting: Plastic Surgery

## 2022-09-08 ENCOUNTER — Ambulatory Visit: Payer: Medicare Other | Admitting: Physician Assistant

## 2022-09-08 ENCOUNTER — Ambulatory Visit: Payer: Medicare Other | Admitting: Plastic Surgery

## 2022-09-14 ENCOUNTER — Telehealth (HOSPITAL_COMMUNITY): Payer: Self-pay

## 2022-09-14 NOTE — Telephone Encounter (Signed)
Patient called and state she is moving to [place] on the 27th due to homelessness. This will be with a temporary shelter situation. She wanted to see someone before then, but no appointments available. She has also been working with [place].  Does she need to be seen? She stated she missed her last appointment. She denies any symptoms currently. She asked if we can update her meds at least so she can get refills before she goes.

## 2022-09-19 ENCOUNTER — Other Ambulatory Visit (HOSPITAL_COMMUNITY): Payer: Self-pay

## 2022-09-19 MED ORDER — DAPAGLIFLOZIN PROPANEDIOL 10 MG PO TABS: 10.0000 mg | ORAL_TABLET | Freq: Every day | ORAL | 11 refills | Status: DC

## 2022-09-19 MED ORDER — APIXABAN 5 MG PO TABS: 5.0000 mg | ORAL_TABLET | Freq: Two times a day (BID) | ORAL | 9 refills | Status: DC

## 2022-09-19 MED ORDER — TADALAFIL (PAH) 20 MG PO TABS: 20.0000 mg | ORAL_TABLET | Freq: Two times a day (BID) | ORAL | 1 refills | Status: DC

## 2022-09-19 MED ORDER — POTASSIUM CHLORIDE CRYS ER 20 MEQ PO TBCR: 20.0000 meq | EXTENDED_RELEASE_TABLET | Freq: Every day | ORAL | 3 refills | Status: DC

## 2022-09-19 MED ORDER — OPSUMIT 10 MG PO TABS: 10.0000 mg | ORAL_TABLET | Freq: Every day | ORAL | 3 refills | Status: DC

## 2022-09-19 MED ORDER — TORSEMIDE 20 MG PO TABS: 40.0000 mg | ORAL_TABLET | Freq: Two times a day (BID) | ORAL | 2 refills | Status: DC

## 2022-09-19 MED ORDER — UPTRAVI 800 MCG PO TABS: 1.0000 | ORAL_TABLET | Freq: Two times a day (BID) | ORAL | 0 refills | Status: DC

## 2022-09-19 MED ORDER — SPIRONOLACTONE 25 MG PO TABS: ORAL_TABLET | ORAL | 3 refills | Status: DC

## 2022-09-25 ENCOUNTER — Ambulatory Visit (HOSPITAL_BASED_OUTPATIENT_CLINIC_OR_DEPARTMENT_OTHER): Payer: Self-pay | Admitting: Pulmonary Disease

## 2022-09-25 ENCOUNTER — Telehealth: Payer: Self-pay | Admitting: Pulmonary Disease

## 2022-09-25 NOTE — Progress Notes (Deleted)
Subjective:   PATIENT ID: Dawn Fowler GENDER: female DOB: 08-May-1966, MRN: [id number]   No chief complaint on file.  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  Synopsis: Previously followed for chronic hypoxemic failure 2/2 PH and severe OSA at the [facility] in [place], [place]. Work-up included RHC 12/2016 RA 19 PA 76/40/48 PCWP 20 CO 4.02L/min CI 1.9, V/Q scan (neg), PFTs 06/2018 (mod restrictive defect), CT Chest (no evidence of ILD). Started on Opsumit and tadalafil. Moved to [place] in 2020. She establishing care in Pulmonary clinic, she was found to be in volume overload. She eventually was admitted for acute diastolic heart failure for IV diuretics. She underwent repeat work-up for her PH including RHC 12/2019 which demonstrated elevated right and left heart filling pressure with severe PH, V/Q scan (neg), PFT 12/2019 (severe restriction and reduced DLCO), HRCT (no evidence of ILD) and negative serologies.  08/20/20 Since our last visit, she had a sleep study completed however did not sleep without interruption and felt the experience was overall poor.  She did not feel the sleep study was representative of what she did at home.  Otherwise she reports her dyspnea is at baseline and that she has been compliant with therapies including her home oxygen 4 L at rest and 8 L on exertion.  She continues to wear her CPAP which she finds comfortable and improves her dyspnea and quality of sleep.  When she does not wear her CPAP she notices a difference in her breathing. She has been participating PT and noticed her SpO2 to low 80s on 6L via [place]. She recovers within 2 minutes with rest with sats to upper 90s. Cardiology has added uptravi to her current regiment of Opsumit  and tadalfil. She has diarrhea that is controlled lomotil. She has been referred to weight loss clinic but not heard from it. She is considering a breast reduction.  Denies dizziness, chest pain, cough, wheezing or leg swelling.  No recent infections.  She also has questions about Covid and shingles vaccines.  08/02/21 Since our last visit, she has had back issues. She has been referred to Plastics to evaluate for breast reduction surgery as she feels this significantly affects her breathing. She has restarted Breo and is compliant with her oxygen 8L on exertion. Compliant with CPAP which provide clinically benefit with use including improved quality of sleep and energy. She is able to ambulate within the house but has difficulty with upper body activities that cause her to be winded. 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 She is compliant with her Uptravi and Opsumit. Cardiology started her on Farxiga and she reports now having a yeast infection that began 2-3 days ago with itching and odor.   09/25/22 Since her last visit she has been seen in follow-up with our pulmonary NP's.  Notes reviewed with reports of decreased stamina and increased shortness of breath.  On her last visit in October she was appealing her disability.  She follows with cardiology and is compliant with her hypertension medications including Opsumit, Uptravi, tadalfil, torsemide and spironolactone.  However did report some issues with volume when running out of her medications.  She wears  4 L O2 at rest and 8 L O2 with activity, which is her baseline.  Reports compliance with CPAP nightly.  Social history: Social stressors with her son and daughter-in-law has been contributing to her depression Friend planning to move in for support  Past Medical History:  Diagnosis Date   Acute respiratory failure due to COVID-19 ([place])  10/30/2019   Anxiety    Arthritis    CHF (congestive heart failure) (HCC)    CKD (chronic kidney disease), stage III (HCC)    Complication of anesthesia    hard to wake up after general anesthesia - had trouble waking up after succinylcholine (consider possibility of pseudocholinesterase deficiency)   COVID-19 virus detected 11/20/2019   Tested positive for Covid in December/2020 Required hospitalization in December/2020 through January/2021 Received Redemsivir   Depression    DVT (deep venous thrombosis) ([place]) 06/09/2020   RLE   Dyspnea    Family history of adverse reaction to anesthesia    hard to wake up after general anesthesia   GERD (gastroesophageal reflux disease)    HLD (hyperlipidemia)    HTN (hypertension)    Pre-diabetes    no meds   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; 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, although unsure if she had confirmation testing done   Pulmonary HTN ([place])    Sleep apnea    on CPAP   Trichomonas infection     Outpatient Medications Prior to Visit  Medication Sig Dispense Refill   acetaminophen (TYLENOL) 500 MG tablet Take 1,000 mg by mouth every 6 (six) hours as needed for moderate pain or headache.     allopurinol (ZYLOPRIM) 100 MG tablet Take 1 tablet (100 mg total) by mouth daily. 30 tablet 2   apixaban (ELIQUIS) 5 MG TABS tablet Take 1 tablet (5 mg total) by mouth 2 (two) times daily. 60 tablet 9   Calcium Carbonate Antacid (TUMS E-X PO) Take 1-2 tablets by mouth 3 (three) times daily as needed (gas/abdominal pain.).     Cholecalciferol (VITAMIN D3) 125 MCG (5000 UT) CAPS Take 5,000 Units by mouth in the morning.     colchicine 0.6 MG tablet Take 0.6 mg by mouth daily as needed (gout flare).     dapagliflozin propanediol (FARXIGA) 10 MG TABS tablet Take 1 tablet (10 mg total) by mouth daily before breakfast. 30 tablet 11   diclofenac Sodium (VOLTAREN) 1 % GEL Apply 2 g topically 3  (three) times daily as needed (pain.).     diphenhydramine-acetaminophen (TYLENOL PM) 25-500 MG TABS tablet Take 2 tablets by mouth at bedtime as needed (sleep).     fluticasone furoate-vilanterol (BREO ELLIPTA) 100-25 MCG/ACT AEPB Inhale 1 puff into the lungs daily. 30 each 5   gabapentin (NEURONTIN) 100 MG capsule Take 3 capsules by mouth at bedtime.     ipratropium-albuterol (DUONEB) 0.5-2.5 (3) MG/3ML SOLN Take 3 mLs by nebulization every 6 (six) hours as needed (wheezing/shortness of breath).     macitentan (OPSUMIT) 10 MG tablet Take 1 tablet (10 mg total) by mouth daily. 90 tablet 3   OXYGEN Inhale 2-8 L into the lungs continuous.     potassium chloride SA (KLOR-CON M) 20 MEQ tablet Take 1 tablet (20 mEq total) by mouth daily. 90 tablet 3   Selexipag (UPTRAVI) 800 MCG TABS Take 1 tablet (800 mcg total) by mouth 2 (two) times daily. 180 tablet 0   sertraline (ZOLOFT) 50 MG tablet Take 1 tablet (  50 mg total) by mouth daily. 30 tablet 2   spironolactone (ALDACTONE) 25 MG tablet TAKE 1/2 TABLET(12.5 MG) BY MOUTH DAILY 45 tablet 3   tadalafil, PAH, (ADCIRCA) 20 MG tablet Take 1 tablet (20 mg total) by mouth 2 (two) times daily. 180 tablet 1   torsemide (DEMADEX) 20 MG tablet Take 2 tablets (40 mg total) by mouth 2 (two) times daily. 120 tablet 2   No facility-administered medications prior to visit.    Review of Systems  Constitutional:  Negative for chills, diaphoresis, fever, malaise/fatigue and weight loss.  HENT:  Negative for congestion.   Respiratory:  Positive for shortness of breath. Negative for cough, hemoptysis, sputum production and wheezing.   Cardiovascular:  Negative for chest pain, palpitations and leg swelling.  Musculoskeletal:  Positive for back pain.     Objective:   There were no vitals filed for this visit. There is no height or weight on file to calculate BMI.     Physical Exam: General: Well-appearing, no acute distress HENT: [place], AT Eyes: EOMI, no scleral  icterus Respiratory: ***Clear to auscultation bilaterally.  No crackles, wheezing or rales Cardiovascular: RRR, -M/R/G, no JVD Extremities:-Edema,-tenderness Neuro: AAO x4, CNII-XII grossly intact Psych: Normal mood, normal affect  Data Reviewed:  Imaging:  12/25/2014-VQ scan-mild obstructive airways disease, very low probability of PE  12/18/2014-CTA anterior-no evidence of PE, cardiomegaly, dependent airspace trapping which may be related to morbid obesity or small airways disease ^Unable to view images but can read radiology report  07/26/18 CT Chest WO-(report only) enlarged MPA and centroal pulmonary arteries consistent with hx of pulmonary hypertension.  Stable predominantly basilar mosaic attenuation reflects either small vessel or small airways disease.  Persistent predominant subcarinal, paraesophageal and bilateral infrahilar lymph node enlargement unchanged from 4 months ago.  Stable small perifissural nodule likely representing intranodular lymph node.  Other concerning parenchymal lesions not seen. -Report does not mention any interstitial lung disease.  05/11/2020- CXR cardiomegaly. No pulmonary edema or infiltrate  12/21/19 -VQ scan- no evidence of chronic PE  12/21/19 CT Chest - No parenchymal abnormalities. Mosaic attenuation. Enlarged [facility]  11/07/2021 CXR-unchanged interstitial markings.  No acute infiltrate, effusion or edema  PFT: 02/11/2015-office spirometry-FVC 2.14 L, FEV1 1.78, ratio 83  12/22/19  FVC 1.43 (48%) FEV1 1.27 (54%) Ratio 87  TLC 64% DLCO 32% Interpretation: No evidence of obstructive defect however significant response to bronchodilators suggestive of asthma. Co-comitant severe restrictive lung defect with reduced gas exchange also present.  Echo 12/21/2014 LV ejection fraction 60 to 65%, moderate pulmonary hypertension, moderate diastolic dysfunction  6/81/27  LV EF 55-60%, moderately dilated RV with decreased systolic function    03/16/99 RHC  Procedural Findings: Hemodynamics (mmHg) RA mean 14 RV 87/21 PA 89/33, mean 54 PCWP mean 34 Oxygen saturations: SVC 63% PA 61% AO 94% Cardiac Output (Fick) 5.17  Cardiac Index (Fick) 2.22 PVR 3.87 WU  03/08/2021-normal EF, grade 1 DD evidence of volume overload  CPAP Compliance 07/02/21-07/31/21 Usage days 100% >4 hours 100% AHI 0.9 on CPAP 9 cm H20  Assessment & Plan:   Discussion: 56 year old female never smoker with chronic hypoxemic respiratory failure, severe OSA, severe pulmonary hypertension (group II, III), chronic diastolic heart failure, CKD stage III, morbid obesity and restrictive lung defect who presents for follow-up  Chronic hypoxemic respiratory failure 2/2 Severe Pulmonary Hypertension Group II, III -Unable to rule out group I (neg autoimmune serologies). Not a transplant candidate due to BMI --Continue supplemental O2 for goal >88% --Continue medications per  Cardiology: Opsumit, tadalfil, uptravi (ERA, PD5, prostacyclin) --On diuretics directed by Cardiology  Severe OSA on CPAP - Compliant  Patient uses NIV for more than four hours nightly for more then 4 hours per night on 70% of nights during the last three months of usage. --Continue CPAP nightly --Counseled NOT to drive if/when sleepy  Moderately severe asthma Restrictive defect likely related to chronic diastolic heart failure --CONTINUE Breo ONE puff daily --Continue nebs PRN  Chronic diastolic heart failure, RV dysfunction --Continue diuretics per Cardiology  Recurrent DVT Hx of RLE DVT in 05/2020 and 2017. Diagnosed peri-travel however given recurrence, may need to consider lifelong anticoagulation. Cardiology agrees with long-term anticoagulation. --Continue Eliquis  Yeast infection --Diflucan once --Do not take colchicine while taking this  Health Maintenance Immunization History  Administered Date(s) Administered   Fluad Quad(high Dose 65+) 09/20/2020   Influenza Whole 08/20/2019    Influenza,inj,Quad PF,6+ Mos 06/25/2018   PFIZER(Purple Top)SARS-COV-2 Vaccination 02/02/2020, 02/24/2020, 09/27/2020   Pneumococcal Polysaccharide-23 07/17/2014   CT Lung Screen -never smoker, would not qualify  I have spent a total time of 35-minutes on the day of the appointment reviewing prior documentation, coordinating care and discussing medical diagnosis and plan with the patient/family. Past medical history, allergies, medications were reviewed. Pertinent imaging, labs and tests included in this note have been reviewed and interpreted independently by me.  [place], MD [facility] 09/25/2022 8:13 AM  Office Number [phone]

## 2022-09-25 NOTE — Telephone Encounter (Signed)
ATC LMTCB 09/25/2022 @ 11:18 AM

## 2022-10-06 ENCOUNTER — Telehealth: Payer: Self-pay | Admitting: Pulmonary Disease

## 2022-10-09 NOTE — Telephone Encounter (Signed)
Letter created and sent via mychart, left message for her to call office if letter needs to be printed and faxed to [employer].

## 2022-10-09 NOTE — Telephone Encounter (Signed)
Please draft letter with the following content in the body:  Dawn Fowler is a patient at [facility]. She is oxygen dependent and requires 4L oxygen during the day and 8L oxygen at night for chronic lung illness (Pulmonary hypertension). In the event of of a power outage, her home would need to be prioritized to restore service as loss of oxygen support would be life-threatening.

## 2022-10-09 NOTE — Telephone Encounter (Signed)
She needs letter to have  her current address on it please. My chart again but this time adding her current address.  [address] [place] [address] Thanks.

## 2022-10-09 NOTE — Telephone Encounter (Signed)
New letter completed with patient's address. Nothing further

## 2022-10-13 ENCOUNTER — Ambulatory Visit: Payer: Medicare Other | Admitting: Physician Assistant

## 2022-10-13 ENCOUNTER — Other Ambulatory Visit (HOSPITAL_COMMUNITY): Payer: Self-pay

## 2022-10-18 ENCOUNTER — Other Ambulatory Visit (HOSPITAL_COMMUNITY): Payer: Self-pay

## 2022-10-18 MED ORDER — APIXABAN 5 MG PO TABS
5.0000 mg | ORAL_TABLET | Freq: Two times a day (BID) | ORAL | 9 refills | Status: AC
Start: 2022-10-18 — End: ?

## 2022-10-18 MED ORDER — ALLOPURINOL 100 MG PO TABS: 100.0000 mg | ORAL_TABLET | Freq: Every day | ORAL | 2 refills | Status: DC

## 2022-10-24 ENCOUNTER — Other Ambulatory Visit (HOSPITAL_COMMUNITY): Payer: Self-pay

## 2022-10-24 ENCOUNTER — Telehealth (HOSPITAL_COMMUNITY): Payer: Self-pay | Admitting: Pharmacist

## 2022-10-24 NOTE — Telephone Encounter (Signed)
Advanced Heart Failure Patient Advocate Encounter  Prior Authorization for Opsumit has been approved.    PA# [id number] Effective dates: 09/24/22 through 10/23/2025  Audry Riles, PharmD, BCPS, BCCP, CPP Heart Failure Clinic Pharmacist [phone]

## 2022-10-25 ENCOUNTER — Telehealth (HOSPITAL_COMMUNITY): Payer: Self-pay

## 2022-10-25 ENCOUNTER — Other Ambulatory Visit (HOSPITAL_COMMUNITY): Payer: Self-pay

## 2022-10-25 NOTE — Telephone Encounter (Signed)
Pharmacy states Dawn Fowler is not covered. Can we get her approved for it?

## 2022-10-26 ENCOUNTER — Other Ambulatory Visit (HOSPITAL_COMMUNITY): Payer: Self-pay

## 2022-10-31 ENCOUNTER — Other Ambulatory Visit (HOSPITAL_COMMUNITY): Payer: Self-pay

## 2022-11-14 ENCOUNTER — Other Ambulatory Visit (HOSPITAL_COMMUNITY): Payer: Self-pay | Admitting: Family Medicine

## 2022-11-22 ENCOUNTER — Other Ambulatory Visit (HOSPITAL_COMMUNITY): Payer: Self-pay

## 2022-11-28 ENCOUNTER — Other Ambulatory Visit (HOSPITAL_COMMUNITY): Payer: Self-pay

## 2022-11-28 MED ORDER — TADALAFIL (PAH) 20 MG PO TABS: 20.0000 mg | ORAL_TABLET | Freq: Two times a day (BID) | ORAL | 1 refills | Status: DC

## 2022-12-04 ENCOUNTER — Telehealth (INDEPENDENT_AMBULATORY_CARE_PROVIDER_SITE_OTHER): Payer: Self-pay | Admitting: Nurse Practitioner

## 2022-12-04 ENCOUNTER — Encounter: Payer: Self-pay | Admitting: Nurse Practitioner

## 2022-12-04 ENCOUNTER — Telehealth: Payer: Self-pay

## 2022-12-04 VITALS — HR 82 | Ht 61.0 in | Wt 224.0 lb

## 2022-12-04 DIAGNOSIS — J9611 Chronic respiratory failure with hypoxia: Secondary | ICD-10-CM

## 2022-12-04 NOTE — Telephone Encounter (Signed)
Dawn Fowler, pt called today she recently moved to [place] and is needing a referral to a new pulmonologist in that area, she also requested that we send in refills of [place]  and Duoneb to her preferred pharmacy in [place] (already on her epic).  She also mentioned she is currently dealing with an infected tooth, I let her know that she should contact a new PCP office up there for further antibiotics/care of her mouth

## 2022-12-04 NOTE — Telephone Encounter (Signed)
Ok to send referral and refill her medications once with two refills, which should provide her with enough to get in with new PCP in [place] and pulmonologist. Go to local UC/ED if tooth gets worse. Thanks.

## 2022-12-05 ENCOUNTER — Other Ambulatory Visit: Payer: Self-pay

## 2022-12-05 ENCOUNTER — Other Ambulatory Visit: Payer: Self-pay | Admitting: Nurse Practitioner

## 2022-12-05 DIAGNOSIS — J984 Other disorders of lung: Secondary | ICD-10-CM

## 2022-12-05 MED ORDER — FLUTICASONE FUROATE-VILANTEROL 100-25 MCG/ACT IN AEPB: 1.0000 | INHALATION_SPRAY | Freq: Every day | RESPIRATORY_TRACT | 2 refills | Status: DC

## 2022-12-05 MED ORDER — IPRATROPIUM-ALBUTEROL 0.5-2.5 (3) MG/3ML IN SOLN: 3.0000 mL | Freq: Four times a day (QID) | RESPIRATORY_TRACT | 0 refills | Status: DC | PRN

## 2022-12-05 NOTE — Progress Notes (Signed)
Virtual visit scheduled. Pt unable to connect. Relocated to [place] the end of November 2023. Denies any acute respiratory symptoms. Last seen 07/2022. Would like refills and referral to local pulmonologist. Visit canceled for today.

## 2022-12-05 NOTE — Telephone Encounter (Signed)
Referral and refills sent for pt

## 2022-12-06 NOTE — Telephone Encounter (Signed)
Referral was placed for pt for [place] provider - I called phone # on referral for Dr Delsa Sale to get fax # and was made aware they only take pt's with [facility].  Spoke to [place] and we decided pt will need to check around in [place] and see who can see her and let us know and then we will send referral.  I spoke to pt & made her aware.

## 2022-12-13 ENCOUNTER — Other Ambulatory Visit (HOSPITAL_COMMUNITY): Payer: Self-pay

## 2022-12-13 ENCOUNTER — Telehealth (HOSPITAL_COMMUNITY): Payer: Self-pay | Admitting: Pharmacist

## 2022-12-13 MED ORDER — OPSUMIT 10 MG PO TABS: 10.0000 mg | ORAL_TABLET | Freq: Every day | ORAL | 3 refills | Status: AC

## 2022-12-13 NOTE — Telephone Encounter (Signed)
Patient Advocate Encounter   Received notification that prior authorization for Opsumit is required.   PA submitted on CoverMyMeds Key [id number] Status is pending   Will continue to follow.   Audry Riles, PharmD, BCPS, BCCP, CPP Heart Failure Clinic Pharmacist [phone]

## 2022-12-15 ENCOUNTER — Telehealth (HOSPITAL_COMMUNITY): Payer: Self-pay | Admitting: Pharmacy Technician

## 2022-12-15 ENCOUNTER — Other Ambulatory Visit (HOSPITAL_COMMUNITY): Payer: Self-pay

## 2022-12-15 NOTE — Telephone Encounter (Signed)
Advanced Heart Failure Patient Advocate Encounter  Prior Authorization for Opsumit has been approved.    Effective dates: 12/14/22 through "until further notice"  Audry Riles, PharmD, BCPS, BCCP, CPP Heart Failure Clinic Pharmacist [phone]

## 2022-12-15 NOTE — Telephone Encounter (Signed)
Patient Advocate Encounter   Received notification from [facility] that prior authorization for [facility] is required.   PA submitted on CoverMyMeds Key [id number] Status is pending   Will continue to follow.

## 2022-12-15 NOTE — Telephone Encounter (Signed)
Patient Advocate Encounter   Received notification from [facility] that prior authorization for Tadalafil PAH is required.   PA submitted on CoverMyMeds Key [facility] Status is pending   Will continue to follow.

## 2022-12-18 ENCOUNTER — Other Ambulatory Visit (HOSPITAL_COMMUNITY): Payer: Self-pay

## 2022-12-18 NOTE — Telephone Encounter (Signed)
Advanced Heart Failure Patient Advocate Encounter  PA APPROVED. Effective: 12/15/22 - 10/29/2098 Test billing returns $4.50 for 90 day supply.  Clista Bernhardt, CPhT Rx Patient Advocate Phone: [phone]

## 2022-12-18 NOTE — Telephone Encounter (Addendum)
Advanced Heart Failure Patient Advocate Encounter  Prior Authorization for Dawn Fowler has been approved.    PA# [id number] Effective dates: 12/01/22 through "further notice". Looks like patient has LIS. Affordability shouldn't be an issue.  [name], CPhT

## 2022-12-19 NOTE — Telephone Encounter (Signed)
Authorization # for Dawn Fowler,  [id number]. Provided to [place] today via telephone.  [name], CPhT

## 2022-12-28 ENCOUNTER — Other Ambulatory Visit (HOSPITAL_COMMUNITY): Payer: Self-pay

## 2023-01-18 ENCOUNTER — Other Ambulatory Visit (HOSPITAL_COMMUNITY): Payer: Self-pay | Admitting: Cardiology

## 2023-01-20 ENCOUNTER — Other Ambulatory Visit (HOSPITAL_COMMUNITY): Payer: Self-pay | Admitting: Cardiology

## 2023-02-12 ENCOUNTER — Telehealth: Payer: Self-pay | Admitting: Pulmonary Disease

## 2023-02-12 DIAGNOSIS — J984 Other disorders of lung: Secondary | ICD-10-CM

## 2023-02-12 DIAGNOSIS — J9611 Chronic respiratory failure with hypoxia: Secondary | ICD-10-CM

## 2023-02-12 NOTE — Telephone Encounter (Signed)
PT is calling regarding her O2. She has relocated to [place] and needs [place] to send in a new RX  to her new HSE.     [facility] [place]  [phone] (Fax)  Send attn Referral Management.   She still does not have a Pulmonologist yet and this is going thru disability.  Also, she still needs a PFT that she was unable to get while here.  Her # is [phone]

## 2023-02-14 NOTE — Telephone Encounter (Signed)
PT calling because no call back. Pls call. TY.

## 2023-02-14 NOTE — Telephone Encounter (Signed)
PT spoke to [facility] company today. State they need the following:  Testing Results  Office notes Demographics and the RX for Bipap and O2 Services (She has refill machine and 10L concentrator.)  [facility] out [facility] [place] [phone]  They will come out to do the repairs BUT they need these items fax'd over in case of a future issue.  PLEASE call PT to confirm our actions: [phone]

## 2023-02-15 ENCOUNTER — Other Ambulatory Visit (HOSPITAL_COMMUNITY): Payer: Self-pay

## 2023-02-15 ENCOUNTER — Encounter (HOSPITAL_COMMUNITY): Payer: Self-pay

## 2023-02-15 ENCOUNTER — Telehealth (HOSPITAL_COMMUNITY): Payer: Self-pay

## 2023-02-15 MED ORDER — SERTRALINE HCL 50 MG PO TABS
50.0000 mg | ORAL_TABLET | Freq: Two times a day (BID) | ORAL | 0 refills | Status: AC
Start: 2023-02-15 — End: ?

## 2023-02-15 NOTE — Telephone Encounter (Signed)
If they are unable to repair current oxygen machine, she can call us back and we can come up with an alternative plan in the interim. She needs to call [facility] at [phone]. They see uninsured patients and can at least establish her with a local provider while she waits on new insurance.   Does her disability need her previous office notes from [place] or cardiology?

## 2023-02-15 NOTE — Telephone Encounter (Signed)
Pt still has not been able to get fully established with a new doctor where she now resides. Pt is working on trying to get disability and due to this, there are a lot of different tests that they are requiring her to get done and with all this, has not been able to get established with a new doctor yet but she said that she is working on it.

## 2023-02-15 NOTE — Telephone Encounter (Signed)
[place]art message sent to patient to inform her of update.

## 2023-02-15 NOTE — Telephone Encounter (Signed)
Called and spoke with pt letting her know the info from [place]. Pt said that she still has her equipment from when she lived in [place].  States that the only insurance she has is Medicare Part A if she were to need to go to the hospital for any reason and also states she has Medicare Part D for her prescriptions.   Pt said that a company is coming out today to do a repair on her refill machine with the concentrator that needs to be done. She said if there were any other issues, they would not be able to take care of this without having a prescription.   I did cancel the order that was originally placed for the oxygen with the DME that pt had stated.  Pt said she is not going to be able to come to [facility] for a visit. Pt did say that she is using her old cpap machine that she has had every night.  Pt said she will contact her disability agent to see what they can get worked out for her. Did tell pt that I will fax her last OV notes to the DME so that way they have that info and she verbalized understanding. Nothing further needed.

## 2023-02-15 NOTE — Telephone Encounter (Signed)
Patient called to follow up on disability paperwork that was sent.  Case Worker is Margaretmary Lombard with [place] Dept [employer] Disability [phone] (phone)

## 2023-02-15 NOTE — Telephone Encounter (Signed)
She relocated to [place] in November. She was advised to setup PCP and pulmonologist there. Does she still have her previous oxygen equipment and CPAP from when she lived here and is just looking to switch over to a local DME? If so, those orders need to come from a local provider since we can't see her in person any more.

## 2023-02-15 NOTE — Telephone Encounter (Signed)
She should still be able to see a PCP. She can call around to local clinics to see who takes her Medicare insurance or community health centers that treat people without insurance (if she doesn't still have her Medicare but I would assume she has some sort of insurance if her oxygen is being paid for?). A local provider needs to be managing her care. I cannot order PFTs for someone in a different state, unless she is going to travel back to [place] to be seen in person.   Does she still have her oxygen and CPAP from when she lived here or no? I also can't get her a new CPAP without an in person visit and/or new sleep study, if she has been off of therapy.

## 2023-02-15 NOTE — Telephone Encounter (Signed)
Found out that HSE was supposed to be health equipment provider.   Pt's last couple OV notes have been printed and also have printed the last in-office walk test that was done.  Called and spoke with pt checking to see if the number that was provided for [facility] was a phone number or fax number and she said it was a fax number.  Order has been placed for pt's oxygen has been placed to the DME specified by pt.  In order to get the order for the cpap sent over to the DME, Katie, please advise on the settings that pt needs in this order so we can get it sent over for pt.

## 2023-03-12 ENCOUNTER — Other Ambulatory Visit (HOSPITAL_COMMUNITY): Payer: Self-pay | Admitting: Cardiology

## 2023-03-13 ENCOUNTER — Other Ambulatory Visit (HOSPITAL_COMMUNITY): Payer: Self-pay

## 2023-03-13 MED ORDER — TORSEMIDE 20 MG PO TABS: ORAL_TABLET | ORAL | 0 refills | Status: DC

## 2023-03-13 NOTE — Telephone Encounter (Signed)
Patient called and reminded to establish care with Cardiology or have PCP address future refills of her torsemide medication.

## 2023-03-22 ENCOUNTER — Other Ambulatory Visit (HOSPITAL_COMMUNITY): Payer: Self-pay | Admitting: Cardiology

## 2023-04-03 ENCOUNTER — Other Ambulatory Visit: Payer: Self-pay | Admitting: Primary Care

## 2023-04-10 ENCOUNTER — Telehealth: Payer: Self-pay | Admitting: Pulmonary Disease

## 2023-04-10 NOTE — Telephone Encounter (Signed)
State of [place] calling for a disability claim. They need recent medical notes to verify PT's history of late for a disability claim. (Last visit or two notes) She will refax request as well.    Fax [phone]  Call Back [phone] ( Ms. Dagmar Hait)

## 2023-04-17 ENCOUNTER — Other Ambulatory Visit (HOSPITAL_COMMUNITY): Payer: Self-pay

## 2023-04-20 ENCOUNTER — Other Ambulatory Visit (HOSPITAL_COMMUNITY): Payer: Self-pay | Admitting: Cardiology

## 2023-05-07 ENCOUNTER — Telehealth: Payer: Self-pay | Admitting: Pulmonary Disease

## 2023-05-07 NOTE — Telephone Encounter (Signed)
JE, please advise if you are okay with refill.

## 2023-05-08 NOTE — Telephone Encounter (Signed)
OK with albuterol refill with 2 refills. Please call her for pharmacy location.  Encourage patient to establish with PCP in the local area to continue refills if she cannot see a pulmonologist.

## 2023-05-09 MED ORDER — ALBUTEROL SULFATE HFA 108 (90 BASE) MCG/ACT IN AERS: 2.0000 | INHALATION_SPRAY | Freq: Four times a day (QID) | RESPIRATORY_TRACT | 5 refills | Status: AC | PRN

## 2023-05-09 NOTE — Telephone Encounter (Signed)
Confirmed pharmacy with patient. Albuterol refilled x 6  Patient is establishing care with Pulmonary this month.

## 2023-06-11 ENCOUNTER — Other Ambulatory Visit: Payer: Self-pay | Admitting: Pulmonary Disease

## 2023-06-11 ENCOUNTER — Other Ambulatory Visit (HOSPITAL_COMMUNITY): Payer: Self-pay | Admitting: Cardiology

## 2023-07-03 ENCOUNTER — Other Ambulatory Visit (HOSPITAL_COMMUNITY): Payer: Self-pay | Admitting: Cardiology

## 2023-07-27 ENCOUNTER — Telehealth (HOSPITAL_COMMUNITY): Payer: Self-pay | Admitting: Cardiology

## 2023-07-27 NOTE — Telephone Encounter (Signed)
Patient called to request refill on torsemide Reports she has not established with cards, has appt in late October   Advised pt was given 90 supply of medication 08/2022 with message to establish with cardiology for additional refills   [place] refill provided 12/2022 again pt notified to establish with cardiology in [place]   Pt request for refill denied as pt is no longer our patient

## 2023-07-30 MED ORDER — TORSEMIDE 20 MG PO TABS: ORAL_TABLET | ORAL | 0 refills | Status: DC

## 2023-07-30 NOTE — Addendum Note (Signed)
Addended by: Theresia Bough on: 07/30/2023 09:06 AM   Modules accepted: Orders

## 2023-08-01 ENCOUNTER — Other Ambulatory Visit (HOSPITAL_COMMUNITY): Payer: Self-pay

## 2023-08-01 MED ORDER — TORSEMIDE 20 MG PO TABS: ORAL_TABLET | ORAL | 0 refills | Status: DC

## 2023-08-27 ENCOUNTER — Telehealth (HOSPITAL_COMMUNITY): Payer: Self-pay | Admitting: Pharmacy Technician

## 2023-08-27 ENCOUNTER — Other Ambulatory Visit (HOSPITAL_COMMUNITY): Payer: Self-pay

## 2023-08-27 NOTE — Telephone Encounter (Signed)
Patient Advocate Encounter   Received notification from [facility] that prior authorization for Opsumit is required.   PA submitted on CoverMyMeds Key [id number] Status is pending   Will continue to follow.

## 2023-08-27 NOTE — Telephone Encounter (Signed)
Advanced Heart Failure Patient Advocate Encounter  Prior Authorization for Opsumit has been approved.    PA# [id number] Effective dates: 08/27/23 through 10/29/24  Patients co-pay is $0 (insurance caps claim at 30 days). Unless she establishes care with AHF again, this will be the last year we submit PA's for her.  Archer Asa, CPhT

## 2023-08-27 NOTE — Telephone Encounter (Signed)
Advanced Heart Failure Patient Advocate Encounter  Prior Authorization for Tadalafil PAH has been approved.    PA# [id number] Effective dates: 08/27/23 through 10/29/24  Patients co-pay is not able to be seen since it is a refill too soon.  Archer Asa, CPhT

## 2023-08-27 NOTE — Telephone Encounter (Signed)
Patient Advocate Encounter   Received notification from [facility] that prior authorization for Tadalafil PAH is required.   PA submitted on CoverMyMeds Key [id number] Status is pending   Will continue to follow.

## 2023-08-27 NOTE — Telephone Encounter (Signed)
Patient Advocate Encounter   Received notification from [facility] that prior authorization for Uptravi 800 mcg is required.   PA submitted on CoverMyMeds Key [id number] Status is pending   Will continue to follow.

## 2023-08-30 ENCOUNTER — Other Ambulatory Visit (HOSPITAL_COMMUNITY): Payer: Self-pay | Admitting: Cardiology

## 2023-08-31 ENCOUNTER — Other Ambulatory Visit (HOSPITAL_COMMUNITY): Payer: Self-pay

## 2023-08-31 NOTE — Telephone Encounter (Signed)
Advanced Heart Failure Patient Advocate Encounter  Prior Authorization for Cordie Grice has been approved.    PA# [id number] Effective dates: 08/27/23 through 10/29/24  Patients co-pay is unavailable to see. Refill too soon.  Archer Asa, CPhT

## 2023-09-14 ENCOUNTER — Telehealth (HOSPITAL_COMMUNITY): Payer: Self-pay

## 2023-09-14 NOTE — Telephone Encounter (Signed)
Received a fax requesting medical records from [facility]. Records were successfully faxed to: [phone] ,which was the number provided.. Medical request form will be scanned into patients chart.

## 2023-09-18 ENCOUNTER — Other Ambulatory Visit (HOSPITAL_COMMUNITY): Payer: Self-pay

## 2023-09-18 DIAGNOSIS — I272 Pulmonary hypertension, unspecified: Secondary | ICD-10-CM

## 2023-09-18 MED ORDER — UPTRAVI 800 MCG PO TABS: 1.0000 | ORAL_TABLET | Freq: Two times a day (BID) | ORAL | 0 refills | Status: AC

## 2023-09-18 NOTE — Telephone Encounter (Signed)
Error

## 2023-09-21 ENCOUNTER — Other Ambulatory Visit (HOSPITAL_COMMUNITY): Payer: Self-pay | Admitting: Family Medicine

## 2023-09-26 ENCOUNTER — Other Ambulatory Visit (HOSPITAL_COMMUNITY): Payer: Self-pay | Admitting: Cardiology

## 2023-09-26 DIAGNOSIS — I272 Pulmonary hypertension, unspecified: Secondary | ICD-10-CM

## 2023-10-12 ENCOUNTER — Other Ambulatory Visit: Payer: Self-pay | Admitting: Nurse Practitioner

## 2023-11-07 ENCOUNTER — Other Ambulatory Visit (HOSPITAL_COMMUNITY): Payer: Self-pay | Admitting: Cardiology

## 2023-11-07 MED ORDER — TADALAFIL (PAH) 20 MG PO TABS: 20.0000 mg | ORAL_TABLET | Freq: Two times a day (BID) | ORAL | 1 refills | Status: DC

## 2023-11-13 ENCOUNTER — Other Ambulatory Visit (HOSPITAL_COMMUNITY): Payer: Self-pay | Admitting: Cardiology

## 2023-11-14 ENCOUNTER — Telehealth (HOSPITAL_COMMUNITY): Payer: Self-pay

## 2023-11-14 DIAGNOSIS — I272 Pulmonary hypertension, unspecified: Secondary | ICD-10-CM

## 2023-11-14 MED ORDER — TADALAFIL (PAH) 20 MG PO TABS: ORAL_TABLET | ORAL | 1 refills | Status: DC

## 2023-11-14 NOTE — Telephone Encounter (Signed)
 Patient's tadalafil  refill was sent to patient's pharmacy.

## 2023-12-24 ENCOUNTER — Telehealth (HOSPITAL_COMMUNITY): Payer: Self-pay | Admitting: Cardiology

## 2023-12-24 NOTE — Telephone Encounter (Signed)
 Called patient at [phone] to remind patient of her appointment on Tuesday 12/25/23 at 11:20 AM to see Dr. Shirlee Latch.   Front office spoke to patient directly and confirmed appt with patient over the telephone on 12/24/23.

## 2023-12-25 ENCOUNTER — Ambulatory Visit (HOSPITAL_COMMUNITY)
Admission: RE | Admit: 2023-12-25 | Discharge: 2023-12-25 | Disposition: A | Discharge status: Home / Self Care | Payer: Medicare Other | Source: Ambulatory Visit | Attending: Cardiology | Admitting: Cardiology

## 2023-12-25 ENCOUNTER — Encounter (HOSPITAL_COMMUNITY): Payer: Self-pay | Admitting: Cardiology

## 2023-12-25 VITALS — BP 122/88 | HR 83

## 2023-12-25 DIAGNOSIS — N183 Chronic kidney disease, stage 3 unspecified: Secondary | ICD-10-CM | POA: Diagnosis not present

## 2023-12-25 DIAGNOSIS — I272 Pulmonary hypertension, unspecified: Secondary | ICD-10-CM | POA: Diagnosis present

## 2023-12-25 DIAGNOSIS — M25562 Pain in left knee: Secondary | ICD-10-CM | POA: Diagnosis not present

## 2023-12-25 DIAGNOSIS — Z9981 Dependence on supplemental oxygen: Secondary | ICD-10-CM | POA: Insufficient documentation

## 2023-12-25 DIAGNOSIS — J984 Other disorders of lung: Secondary | ICD-10-CM | POA: Diagnosis not present

## 2023-12-25 DIAGNOSIS — I130 Hypertensive heart and chronic kidney disease with heart failure and stage 1 through stage 4 chronic kidney disease, or unspecified chronic kidney disease: Secondary | ICD-10-CM | POA: Diagnosis not present

## 2023-12-25 DIAGNOSIS — Z9013 Acquired absence of bilateral breasts and nipples: Secondary | ICD-10-CM | POA: Diagnosis not present

## 2023-12-25 DIAGNOSIS — G4733 Obstructive sleep apnea (adult) (pediatric): Secondary | ICD-10-CM | POA: Insufficient documentation

## 2023-12-25 DIAGNOSIS — M25561 Pain in right knee: Secondary | ICD-10-CM | POA: Diagnosis not present

## 2023-12-25 DIAGNOSIS — I5032 Chronic diastolic (congestive) heart failure: Secondary | ICD-10-CM | POA: Diagnosis not present

## 2023-12-25 DIAGNOSIS — M170 Bilateral primary osteoarthritis of knee: Secondary | ICD-10-CM | POA: Diagnosis not present

## 2023-12-25 DIAGNOSIS — Z86718 Personal history of other venous thrombosis and embolism: Secondary | ICD-10-CM | POA: Diagnosis not present

## 2023-12-25 DIAGNOSIS — Z79899 Other long term (current) drug therapy: Secondary | ICD-10-CM | POA: Insufficient documentation

## 2023-12-25 DIAGNOSIS — M25539 Pain in unspecified wrist: Secondary | ICD-10-CM | POA: Diagnosis not present

## 2023-12-25 DIAGNOSIS — E669 Obesity, unspecified: Secondary | ICD-10-CM | POA: Diagnosis not present

## 2023-12-25 DIAGNOSIS — M25569 Pain in unspecified knee: Secondary | ICD-10-CM | POA: Insufficient documentation

## 2023-12-25 NOTE — Patient Instructions (Signed)
 You will be called about sotatercept  Your physician has requested that you have an echocardiogram. Echocardiography is a painless test that uses sound waves to create images of your heart. It provides your doctor with information about the size and shape of your heart and how well your heart's chambers and valves are working. This procedure takes approximately one hour. There are no restrictions for this procedure. Please do NOT wear cologne, perfume, aftershave, or lotions (deodorant is allowed). Please arrive 15 minutes prior to your appointment time.  Please note: We ask at that you not bring children with you during ultrasound (echo/ vascular) testing. Due to room size and safety concerns, children are not allowed in the ultrasound rooms during exams. Our front office staff cannot provide observation of children in our lobby area while testing is being conducted. An adult accompanying a patient to their appointment will only be allowed in the ultrasound room at the discretion of the ultrasound technician under special circumstances. We apologize for any inconvenience.  You have been referred to Dr. August Saucer at Ortho. His office will call you to arrange your appointment.  Your physician recommends that you schedule a follow-up appointment in: 2 months. (April) ** PLEASE CALL THE OFFICE IN MARCH TO ARRANGE YOUR FOLLOW UP APPOINTMENT.**  If you have any questions or concerns before your next appointment please send [place] a message through [place] or call our office at [phone].    TO LEAVE A MESSAGE FOR THE NURSE SELECT OPTION 2, PLEASE LEAVE A MESSAGE INCLUDING: YOUR NAME DATE OF BIRTH CALL BACK NUMBER REASON FOR CALL**this is important as we prioritize the call backs  YOU WILL RECEIVE A CALL BACK THE SAME DAY AS LONG AS YOU CALL BEFORE 4:00 PM  At the [facility], you and your health needs are our priority. As part of our continuing mission to provide you with exceptional  heart care, we have created designated Provider Care Teams. These Care Teams include your primary Cardiologist (physician) and Advanced Practice Providers (APPs- Physician Assistants and Nurse Practitioners) who all work together to provide you with the care you need, when you need it.   You may see any of the following providers on your designated Care Team at your next follow up: Dr Arvilla Meres Dr Marca Ancona Dr. Dorthula Nettles Dr. Clearnce Hasten Amy Filbert Schilder, NP Robbie Lis, [place] [facility] [place], [place] Brynda Peon, NP [place] Lee, NP Clarisa Kindred, NP Karle Plumber, PharmD Enos Fling, PharmD   Please be sure to bring in all your medications bottles to every appointment.    Thank you for choosing [facility]

## 2023-12-25 NOTE — Progress Notes (Signed)
 6 Min Walk Test Completed  Pt ambulated 78ft (228.91m) O2 Sat ranged 82-93% on 4-6 L oxygen HR ranged 88-116

## 2023-12-26 ENCOUNTER — Other Ambulatory Visit (HOSPITAL_COMMUNITY): Payer: Self-pay

## 2023-12-26 ENCOUNTER — Telehealth (HOSPITAL_COMMUNITY): Payer: Self-pay | Admitting: Pharmacy Technician

## 2023-12-26 NOTE — Telephone Encounter (Signed)
 Patient Advocate Encounter   Received notification from [facility] that prior authorization for Winrevair 45mg  kit is required.   PA submitted on CoverMyMeds Key [id number] Status is pending   Will continue to follow.

## 2023-12-26 NOTE — Telephone Encounter (Signed)
 Advanced Heart Failure Patient Advocate Encounter  Prior Authorization for Dawn Fowler has been approved.    PA# [id number] Effective dates: 12/26/23 through 10/29/24  Patients co-pay is $0  Will send referral to hub.

## 2023-12-26 NOTE — Progress Notes (Signed)
 ID:  Dawn Fowler, DOB [birth year], MRN [id number]   Provider location: [facility] Type of Visit: Established patient   PCP:  Dois Davenport, MD  Cardiologist:  Dr. Shirlee Latch  Chief complaint: Pulmonary hypertension   History of Present Illness: Dawn Fowler is a 58 y.o. 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  She is on CPAP at home and 6L home oxygen (says she has been on oxygen for "years.").  When she lived in [place], she had a workup for pulmonary hypertension that included V/Q scan (negative), RHC showing mixed pulmonary venous/pulmonary arterial HTN (PVR 3 WU), CT chest w/o ILD.  She additionally has significant HTN.  She had been on Opsumit 10 + tadalafil 20 mg daily but ran out of this prior to her 2/21 admission. She also has a history of prior DVT after long train ride, has had 2 negative V/Q scans.  She was hospitalized for COVID-19 at [facility] in 1/21.    She was seen by [facility] after discharge from COVID admission and was noted to be volume overloaded, Lasix was changed to torsemide but she was unable to get to pharmacy to pick up. Subsequently, she developed progressive wt gain, nearly 20 lb and increased dyspnea, prompting her to report back to the ED in 2/21, where she was found to be in acute on chronic diastolic HF and readmitted for IV diuresis and AHF consultation. Echo in 2/21 showed LVEF 55-60%, moderate RV dilation/moderate RV dysfunction. RHC 2/21 showed elevated left and right heart filling pressures with severe pulmonary hypertension. PFTs completed showing severe restriction and severely decreased DLCO. High resolution CT was concerning  for small vessel disease, no ILD, and concerning for PAH.  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 She was continued on IV diuretics and had good  response, diuresing 32 lb. She was transitioned to torsemide 40 mg/20 mg. Opsumit and tadalafil restarted.   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  She is on Eliquis. RHC in 9/21 showed normal RA pressure and PCWP, moderate-severe pulmonary arterial hypertension.   Echo 5/22 showed EF 55% with mildly enlarged RV with mild RV dysfunction, mildly D-shaped septum, PASP 48 with normal IVC.   In 2/23, she had bilateral breast reduction surgery. She developed significant volume overload and her torsemide was increased.  She moved to [place] and had RHC there in 2/25; normal PCWP and RA pressure but PA 87/32 mean 54 with PVR 11 WU and PCWP 15, CI 1.8 (thermo). V/Q scan in 2/25 showed no sign of chronic PEs.   Today she returns for HF follow up.  She just recently moved from [place] back to [facility].  She is now living with her aunt.  She notes dypspnea "rushing" and carrying items.  She uses a walker.  She uses 4L home oxygen at night. No chest pain.  No orthopnea/PND.  Mainly limited by knee pain from arthritis.   Labs (6/21): K 4.5, creatinine 2.09 Labs (8/21): K 4.3, creatinine 2.11 Labs (9/21): K 3.9, creatinine 2.0 Labs (10/21): BNP 58 Labs (12/21): K 3.8, creatinine 2.28 Labs (1/22): BNP 160, K 3.7, creatinine 1.79 Labs (3/22): BNP 15.7, K 3.9, creatinine 2.33 Labs (5/22): K 3.8, creatinine 2.25, BNP 117 Labs (8/22): K 3.9, creatinine 2.16,  BNP 198 Labs (1/23): BNP 276 Labs (2/23): K 3.8, creatinine 2.01 Labs (3/23): K 4.1, creatinine 2.19 Labs (5/23): K 4.0, creatinine 1.9 Labs (1/25): K 3.8,c reatinine 1.86, hgb 15.6   6 minute walk (9/21): 168 m 6 minute walk (10/21): 122 m 6 minute walk (3/22): 107 m 6 minute walk (2/25): 229 m  PMH: 1. OHS/OSA: Uses home oxygen.  2. CKD stage IV 3. COVID-19 PNA in 12/20 4. Restrictive lung disease: PFTs (2/21) with severe restriction, severely decreased DLCO.  - High resolution CT chest (2/21): no interstitial lung  disease, "small vessel disease."  - Breast reduction surgery 2/23 5. Gout 6. HTN 7. DVT: Remote, after train ride.  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  8. Pulmonary hypertension: RHC (2/21) with mean RA 14, PA 89/33 mean 54, mean PCWP 34, CI 2.22, PVR 3.87 WU => mixed pulmonary arterial/pulmonary venous hypertension.  - V/Q scan (2/21): No evidence for chronic PE.  - Echo (2/21): EF 55-60%, moderately dilated RV with moderately decreased systolic function.  - HIV and rheumatologic serologies negative.  - RHC (9/21): mean RA 9, PA 68/23 mean 40, mean PCWP 11, PVR 5.6 WU, no step up in oxygen saturation so no evidence for left to right shunting.  - Echo (5/22): EF 55% with mildly enlarged RV with mild RV dysfunction, mildly D-shaped septum, PASP 48 with normal IVC - RHC (2/25): mean RA 7, PA 87/32 mean 54, mean PCWP 15, CI thermo 1.8, PVR 11 WU. 9. Depression   Current Outpatient Medications  Medication Sig Dispense Refill   acetaminophen (TYLENOL) 500 MG tablet Take 1,000 mg by mouth every 6 (six) hours as needed for moderate pain or headache.     albuterol (VENTOLIN HFA) 108 (90 Base) MCG/ACT inhaler Inhale 2 puffs into the lungs every 6 (six) hours as needed for wheezing or shortness of breath. 8 g 5   allopurinol (ZYLOPRIM) 100 MG tablet TAKE 1 TABLET(100 MG) BY MOUTH DAILY 30 tablet 2   apixaban (ELIQUIS) 5 MG TABS tablet Take 1 tablet (5 mg total) by mouth 2 (two) times daily. 60 tablet 9   BREO ELLIPTA 100-25 MCG/ACT AEPB INHALE 1 PUFF INTO THE LUNGS DAILY 60 each 0   Calcium Carbonate Antacid (TUMS E-X PO) Take 1-2 tablets by mouth 3 (three) times daily as needed (gas/abdominal pain.).     Cholecalciferol (VITAMIN D3) 125 MCG (5000 UT) CAPS Take 5,000 Units by mouth in the morning.     colchicine 0.6 MG tablet Take 0.6 mg by mouth daily as needed (gout flare).     diclofenac Sodium (VOLTAREN) 1 % GEL Apply 2 g topically 3 (three) times daily as needed (pain.).      diphenhydramine-acetaminophen (TYLENOL PM) 25-500 MG TABS tablet Take 2 tablets by mouth at bedtime as needed (sleep).     empagliflozin (JARDIANCE) 10 MG TABS tablet Take 10 mg by mouth daily.     gabapentin (NEURONTIN) 100 MG capsule Take 100 mg by mouth as needed.     ipratropium-albuterol (DUONEB) 0.5-2.5 (3) MG/3ML SOLN INHALE THE CONTENTS OF 1 VIAL VIA NEBULIZER EVERY 6 HOURS AS NEEDED FOR WHEEZING OR SHORTNESS OF BREATH 1080 mL 2   macitentan (OPSUMIT) 10 MG tablet Take 1 tablet (10 mg total) by mouth daily. 90 tablet 3   potassium chloride SA (KLOR-CON M) 20 MEQ tablet Take 1 tablet (20 mEq total) by mouth daily. 90 tablet 3   Selexipag (UPTRAVI) 800 MCG TABS  Take 1 tablet (800 mcg total) by mouth 2 (two) times daily. 60 tablet 0   sertraline (ZOLOFT) 50 MG tablet Take 1 tablet (50 mg total) by mouth 2 (two) times daily. 60 tablet 0   spironolactone (ALDACTONE) 25 MG tablet TAKE 1/2 TABLET(12.5 MG) BY MOUTH DAILY 45 tablet 3   tadalafil, PAH, (ADCIRCA) 20 MG tablet Patient takes 2 tablets by mouth daily. 60 tablet 1   torsemide (DEMADEX) 20 MG tablet TAKE 2 TABLETS(40 MG) BY MOUTH TWICE DAILY 120 tablet 0   OXYGEN Inhale 2-8 L into the lungs continuous.     No current facility-administered medications for this encounter.    Allergies  Allergen Reactions   Adhesive [Tape] Hives    Tolerates paper tape   Succinylcholine Other (See Comments)    Have trouble waking up   Tizanidine Hives      Social History   Socioeconomic History   Marital status: Single    Spouse name: Not on file   Number of children: Not on file   Years of education: Not on file   Highest education level: Not on file  Occupational History   Not on file  Tobacco Use   Smoking status: Never   Smokeless tobacco: Never  Vaping Use   Vaping status: Never Used  Substance and Sexual Activity   Alcohol use: Not Currently   Drug use: Not Currently   Sexual activity: Not Currently    Partners: Male    Birth  control/protection: Post-menopausal    Comment: 1st intercourse- 45, partners- more than 5  Other Topics Concern   Not on file  Social History Narrative   Not on file   Social Drivers of Health   Financial Resource Strain: High Risk (07/12/2022)   Overall Financial Resource Strain (CARDIA)    Difficulty of Paying Living Expenses: Very hard  Food Insecurity: Food Insecurity Present (04/11/2022)   Hunger Vital Sign    Worried About Running Out of Food in the Last Year: Sometimes true    Ran Out of Food in the Last Year: Never true  Transportation Needs: No Transportation Needs (11/11/2020)   PRAPARE - Administrator, Civil Service (Medical): No    Lack of Transportation (Non-Medical): No  Physical Activity: Not on file  Stress: Not on file  Social Connections: Not on file  Intimate Partner Violence: Not At Risk (12/20/2023)   Received from [facility]   LH Intimate Violence    Are you afraid or have you ever been threatened by a current partner, family member, or household member?: No    Within the past year, have you ever been hit, slapped, choked, forced into sexual activity or otherwise hurt by a current partner or family member?: No     Family History  Problem Relation Age of Onset   COPD Father    Cancer Mother        gallbladder and bladder    Hypertension Brother    Breast cancer Other    Diabetes Son    Breast cancer Cousin    Breast cancer Cousin    Pulmonary Hypertension Neg Hx    BP 122/88   Pulse 83   LMP  (LMP Unknown) Comment: pmb last 2-3 months  SpO2 97% Comment: 4 ln/c  Wt Readings from Last 3 Encounters:  12/04/22 101.6 kg (224 lb)  08/04/22 108 kg (238 lb)  03/24/22 106.7 kg (235 lb 3.2 oz)   PHYSICAL EXAM:  General: NAD  Neck: JVP 8 cm, no thyromegaly or thyroid nodule.  Lungs: Clear to auscultation bilaterally with normal respiratory effort. CV: Nondisplaced PMI.  Heart regular S1/S2, no S3/S4, no murmur.  No peripheral edema.  No  carotid bruit.  Normal pedal pulses.  Abdomen: Soft, nontender, no hepatosplenomegaly, no distention.  Skin: Intact without lesions or rashes.  Neurologic: Alert and oriented x 3.  Psych: Normal affect. Extremities: No clubbing or cyanosis.  HEENT: Normal.   ASSESSMENT & PLAN: 1. Chronic diastolic CHF with significant RV dysfunction: Echo in 2/21 with LVEF 55-60%, moderate RV dilation/moderate RV dysfunction.  RHC 2/18 showed elevated left and right heart filling pressures with severe pulmonary hypertension.  Suspect mixed pulmonary venous/pulmonary arterial hypertension in setting of significant LV diastolic dysfunction (PVR 3.87 WU).  Repeat RHC in 9/21 with moderate-severe PAH, normal filling pressures, and PVR 5.6 WU.  No evidence for left=>right shunting.  Echo in 5/22 showed EF 55% with mildly enlarged RV with mild RV dysfunction, mildly D-shaped septum, PASP 48 with normal IVC. NYHA class II-III.  She is not volume overloaded on exam today.  - Continue torsemide 40 mg bid. BMET/BNP today.  - Continue spiro 12.5 mg daily. - Continue Farxiga 10 mg daily.  - I will arrange for echo.  2. Pulmonary hypertension: Suspect that this is a mixed picture with group 2 and group 3 PH (related to diastolic LV dysfunction and OHS/OSA).  However, I am concerned for component of group 1 PAH as well.  V/Q scan not suggestive of chronic PEs and high resolution CT chest in 2/21 was not suggestive of ILD. HIV and rheumatologic serologies negative.  RHC showed severe mixed pulmonary venous/pulmonary arterial hypertension, repeat RHC after diuresis in 9/21 showed PAH. PFTs completed in 2/21 showed severe restriction, severely decreased DLCO (?restriction due to body habitus).  Echo in 5/22 showed mild improvement in RV and improvement in estimated PA systolic pressure.  V/Q scan in 2/25 again showed no evidence for chronic PE, and RHC in 2/25 showed normal RA pressure, severe pulmonary arterial hypertension with PVR  11 WU, and PCWP borderline at 15 with low CI.  She is on Opsumit, tadalafil and Uptravi 800 mcg bid.  6 minute walk today improved.  - Continue to maintain oxygen saturation with home O2 and CPAP. Weight loss also would be helpful.  She has had a breast reduction surgery which may decrease lung restriction.  - Continue Opsumit 10 mg daily. - Continue tadalafil 40 mg daily.  - She is tolerating Uptravi 800 mcg bid now, she has not tolerated uptitration.  - I will arrange for sotatercept initiation given ongoing significantly elevated PA pressure.  3. HTN: Controlled.  4. CKD stage 3. BMET today.  - Followup with nephrology.  5. OHS/OSA: Now s/p breast reduction surgery which hopefully will help with OHS.  Compliant w/ CPAP and home oxygen.  6. 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  As she is at baseline sedentary and has had recurrent DVTs, would continue Eliquis long-term.   7. Restrictive lung disease: Now s/p breast reduction surgery.  8. Obesity: Continue to work on weight loss.  Consider GLP-1 agonist in future.  9. Knee/wrist pain: I will refer to Dr. August Saucer at [facility] for evaluation of joint pain (she asks for orthopedic referral).   Follow up in 2 months.   I spent 31 minutes reviewing data, interviewing patient, and organizing the orders/followup.   Signed, Marca Ancona, MD  12/26/2023  Advanced [facility] [address] Heart and Vascular [place] [address] [phone] (office) [phone] (fax)

## 2023-12-31 ENCOUNTER — Other Ambulatory Visit (HOSPITAL_COMMUNITY): Payer: Self-pay

## 2023-12-31 ENCOUNTER — Telehealth (HOSPITAL_COMMUNITY): Payer: Self-pay

## 2023-12-31 NOTE — Telephone Encounter (Signed)
 Attempted to contact patient by phone; no answer, left voice mail.

## 2023-12-31 NOTE — Telephone Encounter (Signed)
 Patient called and left VM stating that she wanted to see when she needs to start her new medication (winrevair)- will forward message to pharmacy team for update on this.

## 2023-12-31 NOTE — Telephone Encounter (Signed)
 Advanced Heart Failure Patient Advocate Encounter  Enrollment and prescription forms faxed to [employer] on 12/31/2023.  Burnell Blanks, CPhT Rx Patient Advocate Phone: [phone]

## 2023-12-31 NOTE — Telephone Encounter (Signed)
 She will not start it until the [employer] hub completes benefits investigation and enrollment. It will then get sent to the specialty pharmacy (likely [facility]) and they will reach out to patient to schedule shipment as well as education for injection technique. Stef is reaching out to the patient to provide her with this information. Thank you.

## 2024-01-02 NOTE — Telephone Encounter (Signed)
 [employer] requested clarification of enrollment form. Updated and faxed on 01/02/2024.

## 2024-01-07 ENCOUNTER — Telehealth (HOSPITAL_COMMUNITY): Payer: Self-pay | Admitting: Pharmacist

## 2024-01-07 NOTE — Telephone Encounter (Signed)
 Received message from triage: [employer] requested patient receive re-education as they are unable to complete Winrevair enrollment because the patient has declined to provide demographic information.   Called patient to request additional information and address problems. Patient did not answer, left VM.   Karle Plumber, PharmD, BCPS, BCCP, CPP Heart Failure Clinic Pharmacist [phone]

## 2024-01-09 ENCOUNTER — Other Ambulatory Visit (INDEPENDENT_AMBULATORY_CARE_PROVIDER_SITE_OTHER)

## 2024-01-09 ENCOUNTER — Telehealth: Payer: Self-pay

## 2024-01-09 ENCOUNTER — Ambulatory Visit: Payer: Medicare Other | Admitting: Orthopedic Surgery

## 2024-01-09 DIAGNOSIS — M25561 Pain in right knee: Secondary | ICD-10-CM

## 2024-01-09 DIAGNOSIS — M79642 Pain in left hand: Secondary | ICD-10-CM

## 2024-01-09 DIAGNOSIS — M170 Bilateral primary osteoarthritis of knee: Secondary | ICD-10-CM

## 2024-01-09 DIAGNOSIS — M79604 Pain in right leg: Secondary | ICD-10-CM

## 2024-01-09 DIAGNOSIS — M25562 Pain in left knee: Secondary | ICD-10-CM

## 2024-01-09 NOTE — Telephone Encounter (Signed)
Auth needed for right knee gel  

## 2024-01-10 ENCOUNTER — Encounter: Payer: Self-pay | Admitting: Orthopedic Surgery
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
## 2024-01-11 MED ORDER — BUPIVACAINE HCL 0.25 % IJ SOLN
4.0000 mL | INTRAMUSCULAR | Status: AC | PRN
  Administered 2024-01-09: 4 mL via INTRA_ARTICULAR

## 2024-01-11 MED ORDER — LIDOCAINE HCL 1 % IJ SOLN
5.0000 mL | INTRAMUSCULAR | Status: AC | PRN
  Administered 2024-01-09: 5 mL

## 2024-01-11 MED ORDER — METHYLPREDNISOLONE ACETATE 40 MG/ML IJ SUSP
40.0000 mg | INTRAMUSCULAR | Status: AC | PRN
Start: 2024-01-09 — End: 2024-01-09
  Administered 2024-01-09: 40 mg via INTRA_ARTICULAR

## 2024-01-25 ENCOUNTER — Telehealth (HOSPITAL_COMMUNITY): Payer: Self-pay

## 2024-01-25 DIAGNOSIS — I272 Pulmonary hypertension, unspecified: Secondary | ICD-10-CM

## 2024-01-25 MED ORDER — SOTATERCEPT-CSRK 2 X 45 MG ~~LOC~~ KIT
0.7000 mg/kg | PACK | SUBCUTANEOUS | Status: DC
Start: 2024-01-25 — End: 2024-03-11

## 2024-01-25 NOTE — Telephone Encounter (Addendum)
 Attempted to contact patient x2 to confirm Winrevair initiation and schedule CBC prior to next dose (02/05/24). Left VM with callback number. Will continue to follow.  Nils Pyle, PharmD PGY1 Pharmacy Resident

## 2024-01-31 NOTE — Telephone Encounter (Signed)
 VOB has been submitted for Durolane, right knee

## 2024-02-08 ENCOUNTER — Other Ambulatory Visit (HOSPITAL_COMMUNITY)

## 2024-02-11 ENCOUNTER — Telehealth (HOSPITAL_COMMUNITY): Payer: Self-pay

## 2024-02-11 DIAGNOSIS — I272 Pulmonary hypertension, unspecified: Secondary | ICD-10-CM

## 2024-02-11 NOTE — Telephone Encounter (Signed)
 Received phone message from patient who states that she needs blood work prior to her winrevair injection- would like to know who she needs to contact in regard to this. Reports she was unable to get dose last week due to no updated blood work. Will forward to pharmd for review and advice on this.

## 2024-02-11 NOTE — Telephone Encounter (Signed)
 She needs a CBC prior to her injection. Chantel had talked with her last week and scheduled her a blood draw on Friday but she no showed the appointment. She just needs a repeat lab appointment with a CBC.

## 2024-02-11 NOTE — Telephone Encounter (Signed)
 Spoke with patient- CBC ordered and scheduled for tomorrow. Patient aware of appointment time and date. Advised patient to call back to office with any issues, questions, or concerns. Patient verbalized understanding.

## 2024-02-12 ENCOUNTER — Other Ambulatory Visit (HOSPITAL_COMMUNITY)

## 2024-02-13 ENCOUNTER — Ambulatory Visit (HOSPITAL_COMMUNITY)
Admission: RE | Admit: 2024-02-13 | Discharge: 2024-02-13 | Disposition: A | Discharge status: Home / Self Care | Source: Ambulatory Visit | Attending: Cardiology | Admitting: Cardiology

## 2024-02-13 DIAGNOSIS — I272 Pulmonary hypertension, unspecified: Secondary | ICD-10-CM | POA: Diagnosis present

## 2024-02-13 DIAGNOSIS — D649 Anemia, unspecified: Secondary | ICD-10-CM | POA: Diagnosis not present

## 2024-02-13 LAB — CBC
HCT: 52.5 % — ABNORMAL HIGH (ref 36.0–46.0)
Hemoglobin: 16.6 g/dL — ABNORMAL HIGH (ref 12.0–15.0)
MCH: 30.1 pg (ref 26.0–34.0)
MCHC: 31.6 g/dL (ref 30.0–36.0)
MCV: 95.3 fL (ref 80.0–100.0)
Platelets: 240 10*3/uL (ref 150–400)
RBC: 5.51 MIL/uL — ABNORMAL HIGH (ref 3.87–5.11)
RDW: 15.5 % (ref 11.5–15.5)
WBC: 4.8 10*3/uL (ref 4.0–10.5)
nRBC: 0 % (ref 0.0–0.2)

## 2024-02-18 ENCOUNTER — Telehealth (HOSPITAL_COMMUNITY): Payer: Self-pay | Admitting: Cardiology

## 2024-02-18 DIAGNOSIS — I272 Pulmonary hypertension, unspecified: Secondary | ICD-10-CM

## 2024-02-18 NOTE — Telephone Encounter (Signed)
 Pt aware will HOLD dose Repeat labs x 3 weeks -pt report she will keep med in fridge

## 2024-02-18 NOTE — Telephone Encounter (Signed)
 Agree with Lauren's plan for temporary hold.

## 2024-02-18 NOTE — Telephone Encounter (Signed)
 Patients HgB increased from 13.4 to 16.6. Per the Winrevair  package insert: Delay treatment for at least 3 weeks if any of the following occur: -Hgb increases >2.0 g/dL from the previous dose and is above ULN. -Hgb increases >4.0 g/dL from baseline. -Hgb increases >2.0 g/dL above ULN.  Since she meets the first criteria (Hgb increases >2.0 g/dL from the previous dose and is above ULN), dose should be held and repeat CBC in 1 week per the package insert. Will defer to Dr. Mitzie Anda for final decision as HgB is close to ULN and her HgB has been up to 15.1 in the past (12/20/21).

## 2024-02-18 NOTE — Telephone Encounter (Signed)
 Pt would like to know if its ok to proceed with second winrevair  inj based on cbc results

## 2024-02-25 ENCOUNTER — Encounter (HOSPITAL_BASED_OUTPATIENT_CLINIC_OR_DEPARTMENT_OTHER): Payer: Self-pay

## 2024-02-27 ENCOUNTER — Ambulatory Visit (HOSPITAL_BASED_OUTPATIENT_CLINIC_OR_DEPARTMENT_OTHER): Payer: Self-pay | Admitting: Pulmonary Disease

## 2024-02-29 ENCOUNTER — Telehealth: Payer: Self-pay | Admitting: Radiology

## 2024-02-29 ENCOUNTER — Ambulatory Visit: Admitting: Physician Assistant

## 2024-02-29 DIAGNOSIS — M1711 Unilateral primary osteoarthritis, right knee: Secondary | ICD-10-CM

## 2024-02-29 MED ORDER — SODIUM HYALURONATE 60 MG/3ML IX PRSY
60.0000 mg | PREFILLED_SYRINGE | INTRA_ARTICULAR | Status: AC | PRN
  Administered 2024-02-29: 60 mg via INTRA_ARTICULAR

## 2024-02-29 NOTE — Telephone Encounter (Signed)
 Hi Mike.  Would you mind looking at this lady's x-rays and letting me know if you think that nail can be removed.  Has been in for 20 years I think she has got enough bony ingrowth across the interlocking screws that it will never come out.  Thanks

## 2024-02-29 NOTE — Telephone Encounter (Signed)
 Patient had Durolane injection today with Norma Beckers. She states that Dr. Rozelle Corning was going to speak to a friend to discuss whether she should have the rod taken out of her leg or not. She is just curious how that conversation went. She is not interested in surgery unless they felt it should be done. Could you please call to advise?  [phone]

## 2024-02-29 NOTE — Progress Notes (Signed)
 Office Visit Note   Patient: Dawn Fowler           Date of Birth: Sep 15, 1966           MRN: [id number] Visit Date: 02/29/2024              Requested by: Allene Ivan, MD [address] [place],  [place] [address] PCP: Allene Ivan, MD  Chief Complaint  Patient presents with  . Right Knee - Pain    Durolane injection      HPI: Patient is a pleasant 58 year old woman who is a patient of Dr. Eliberto Grosser.  Comes in for Duralone injection into her right knee  Assessment & Plan: Osteoarthritis right knee Plan: Went forward with injection today.  Patient was curious with regards to a discussion that Dr. Rozelle Corning had with her about taking out her tibial rod.  She was wondering if he had gotten any more information about that even though she is not planning on doing it right away we will pass this along to his team.  She does have a history of anticoagulation she did not have any bleeding following the injection  Follow-Up Instructions: No follow-ups on file.   Ortho Exam  Patient is alert, oriented, no adenopathy, well-dressed, normal affect, normal respiratory effort. Right knee no effusion no erythema compartments are soft and compressible well-healed surgical scar  Imaging: No results found. No images are attached to the encounter.  Labs: Lab Results  Component Value Date   CRP <0.5 11/03/2019   CRP 0.8 11/02/2019   CRP 0.7 11/01/2019   REPTSTATUS 11/03/2019 FINAL 10/29/2019   CULT  10/29/2019    NO GROWTH 5 DAYS Performed at [facility], [address] [place], [place] [address]      Lab Results  Component Value Date   ALBUMIN  3.0 (L) 12/18/2019   ALBUMIN  3.2 (L) 11/03/2019   ALBUMIN  3.4 (L) 11/02/2019    Lab Results  Component Value Date   MG 2.1 12/19/2019   MG 1.8 12/18/2019   MG 2.1 12/18/2019   No results found for: "VD25OH"  No results found for: "PREALBUMIN"    Latest Ref Rng & Units 02/13/2024   11:25 AM 12/21/2021    8:15 PM  12/21/2021    3:08 AM  CBC EXTENDED  WBC 4.0 - 10.5 K/uL 4.8  11.2  10.1   RBC 3.87 - 5.11 MIL/uL 5.51  4.56  4.64   Hemoglobin 12.0 - 15.0 g/dL 82.9  56.2  13.0   HCT 36.0 - 46.0 % 52.5  43.4  44.0   Platelets 150 - 400 K/uL 240  297  252      There is no height or weight on file to calculate BMI.  Orders:  No orders of the defined types were placed in this encounter.  No orders of the defined types were placed in this encounter.    Procedures: Large Joint Inj on 02/29/2024 11:24 AM Indications: pain and diagnostic evaluation Details: 22 G 1.5 in needle, anteromedial approach  Arthrogram: No  Medications: 60 mg Sodium Hyaluronate 60 MG/3ML Outcome: tolerated well, no immediate complications Procedure, treatment alternatives, risks and benefits explained, specific risks discussed. Consent was given by the patient. Immediately prior to procedure a time out was called to verify the correct patient, procedure, equipment, support staff and site/side marked as required. Patient was prepped and draped in the usual sterile fashion.    Clinical Data: No additional findings.  ROS:  All other systems negative, except as noted in the HPI. Review of Systems  Objective: Vital Signs: LMP  (LMP Unknown) Comment: pmb last 2-3 months  Specialty Comments:  No specialty comments available.  PMFS History: Patient Active Problem List   Diagnosis Date Noted  . Knee pain 12/25/2023  . Chronic diarrhea 08/07/2022  . Breast hypertrophy 12/20/2021  . Chronic diastolic (congestive) heart failure (HCC) 06/10/2020  . Restrictive lung disease 05/11/2020  . RVF (right ventricular failure) (HCC)   . Exertional dyspnea   . Chronic hypoxemic respiratory failure (HCC) 11/20/2019  . OSA on CPAP 11/20/2019  . Complex care coordination 11/20/2019  . Morbid obesity with BMI of 45.0-49.9, adult (HCC) 11/20/2019  . Pulmonary hypertension (HCC) 10/30/2019  . Gout 10/30/2019  . CKD (chronic kidney  disease) stage 3, GFR 30-59 ml/min (HCC) 10/30/2019   Past Medical History:  Diagnosis Date  . Acute respiratory failure due to COVID-19 (HCC) 10/30/2019  . Anxiety   . Arthritis   . CHF (congestive heart failure) (HCC)   . CKD (chronic kidney disease), stage III (HCC)   . Complication of anesthesia    hard to wake up after general anesthesia - had trouble waking up after succinylcholine (consider possibility of pseudocholinesterase deficiency)  . COVID-19 virus detected 11/20/2019   Tested positive for Covid in December/2020 Required hospitalization in December/2020 through January/2021 Received Redemsivir  . Depression   . DVT (deep venous thrombosis) (HCC) 06/09/2020   RLE  . Dyspnea   . Family history of adverse reaction to anesthesia    hard to wake up after general anesthesia  . GERD (gastroesophageal reflux disease)   . HLD (hyperlipidemia)   . HTN (hypertension)   . Pre-diabetes    no meds  . 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 Pulmonary HTN (HCC)   . Sleep apnea    on CPAP  . Trichomonas infection     Family History  Problem Relation Age of Onset  . COPD Father   . Cancer Mother        gallbladder and bladder   . Hypertension Brother   . Breast cancer Other   . Diabetes Son   . Breast cancer Cousin   . Breast cancer Cousin   . Pulmonary Hypertension Neg Hx     Past Surgical History:  Procedure Laterality Date  . BREAST REDUCTION SURGERY Bilateral 12/20/2021   Procedure: bilateral breast reduction with free nipple graft;  Surgeon: Rodman Clam, MD;  Location: [facility] OR;  Service: Plastics;  Laterality: Bilateral;  4 hours  . CARPAL TUNNEL RELEASE Bilateral   . CESAREAN SECTION    . COLONOSCOPY WITH PROPOFOL  N/A 03/12/2020   Procedure: COLONOSCOPY WITH PROPOFOL ;  Surgeon: Alvis Jourdain, MD;  Location: [facility]  ENDOSCOPY;  Service: Endoscopy;  Laterality: N/A;  . DILATATION & CURETTAGE/HYSTEROSCOPY WITH MYOSURE N/A 04/08/2020   Procedure: DILATATION & CURETTAGE/HYSTEROSCOPY WITH MYOSURE;  Surgeon: Lavoie, Marie-Lyne, MD;  Location: [facility] OR;  Service: Gynecology;  Laterality: N/A;  request 8:30am OR start-requested through [facility] for [facility] requests one hour  . HEMOSTASIS CLIP PLACEMENT  03/12/2020   Procedure: HEMOSTASIS CLIP PLACEMENT;  Surgeon: Alvis Jourdain, MD;  Location: [facility] ENDOSCOPY;  Service: Endoscopy;;  . LEG SURGERY Right 1988   metal rod  . lumbar surgery    . POLYPECTOMY  03/12/2020   Procedure: POLYPECTOMY;  Surgeon: Alvis Jourdain, MD;  Location: [facility];  Service: Endoscopy;;  . RIGHT HEART CATH N/A 12/18/2019   Procedure: RIGHT HEART CATH;  Surgeon: Darlis Eisenmenger, MD;  Location: [facility] INVASIVE CV LAB;  Service: Cardiovascular;  Laterality: N/A;  . RIGHT HEART CATH N/A 07/12/2020   Procedure: RIGHT HEART CATH;  Surgeon: Darlis Eisenmenger, MD;  Location: [facility] INVASIVE CV LAB;  Service: Cardiovascular;  Laterality: N/A;   Social History   Occupational History  . Not on file  Tobacco Use  . Smoking status: Never  . Smokeless tobacco: Never  Vaping Use  . Vaping status: Never Used  Substance and Sexual Activity  . Alcohol use: Not Currently  . Drug use: Not Currently  . Sexual activity: Not Currently    Partners: Male    Birth control/protection: Post-menopausal    Comment: 1st intercourse- 60, partners- more than 5

## 2024-03-03 NOTE — Telephone Encounter (Signed)
 Okay thanks that was my thought too  .  Lauren can you let her know that the nail is essentially unremovalable.  Thanks

## 2024-03-05 ENCOUNTER — Telehealth: Payer: Self-pay

## 2024-03-05 NOTE — Telephone Encounter (Signed)
 Patient called again stating that she is having a reaction from the gel injection that she received and it is causing pain and she is not able to walk.  Would like a call back.  [phone].  Please advise.  Thank you.

## 2024-03-05 NOTE — Telephone Encounter (Signed)
 Added.

## 2024-03-06 ENCOUNTER — Encounter: Payer: Self-pay | Admitting: Physician Assistant

## 2024-03-06 ENCOUNTER — Ambulatory Visit: Admitting: Physician Assistant

## 2024-03-06 DIAGNOSIS — M1711 Unilateral primary osteoarthritis, right knee: Secondary | ICD-10-CM

## 2024-03-06 NOTE — Progress Notes (Signed)
 Office Visit Note   Patient: Dawn Fowler           Date of Birth: Jul 14, 1966           MRN: [id number] Visit Date: 03/06/2024              Requested by: Allene Ivan, MD [address] [place] [address] [place],  [place] [address] PCP: Allene Ivan, MD  No chief complaint on file.     HPI: Patient is a 58 year old woman who underwent a right Duralone injection with me last week she is an oral patient of Dr. Rozelle Corning.  She said the day after the injection she just had a lot more pain.  She denies any fever chills erythema or swelling.  She felt like there was a small pocket of fluid on the inside of the knee.  She admits it is slowly getting better.  Assessment & Plan: Visit Diagnoses:  1. Unilateral primary osteoarthritis, right knee     Plan: Patient does not show any signs of reactive synovitis.  And she is getting better.  Will see how she does in the next couple weeks if she has any problems before that should contact me I be happy to see her otherwise she can follow-up with Dr. Rozelle Corning or [place]  Follow-Up Instructions: Return in about 2 weeks (around 03/20/2024).   Ortho Exam  Patient is alert, oriented, no adenopathy, well-dressed, normal affect, normal respiratory effort. Right knee no effusion no erythema compartments are soft and nontender she has some grinding with range of motion.  Neurovascular intact negative Homans' sign no evidence of infection  Imaging: No results found. No images are attached to the encounter.  Labs: Lab Results  Component Value Date   CRP <0.5 11/03/2019   CRP 0.8 11/02/2019   CRP 0.7 11/01/2019   REPTSTATUS 11/03/2019 FINAL 10/29/2019   CULT  10/29/2019    NO GROWTH 5 DAYS Performed at [facility], [address], [place], [place] [address]      Lab Results  Component Value Date   ALBUMIN  3.0 (L) 12/18/2019   ALBUMIN  3.2 (L) 11/03/2019   ALBUMIN  3.4 (L) 11/02/2019    Lab Results  Component Value Date   MG 2.1 12/19/2019    MG 1.8 12/18/2019   MG 2.1 12/18/2019   No results found for: "VD25OH"  No results found for: "PREALBUMIN"    Latest Ref Rng & Units 02/13/2024   11:25 AM 12/21/2021    8:15 PM 12/21/2021    3:08 AM  CBC EXTENDED  WBC 4.0 - 10.5 K/uL 4.8  11.2  10.1   RBC 3.87 - 5.11 MIL/uL 5.51  4.56  4.64   Hemoglobin 12.0 - 15.0 g/dL 82.9  56.2  13.0   HCT 36.0 - 46.0 % 52.5  43.4  44.0   Platelets 150 - 400 K/uL 240  297  252      There is no height or weight on file to calculate BMI.  Orders:  No orders of the defined types were placed in this encounter.  No orders of the defined types were placed in this encounter.    Procedures: No procedures performed  Clinical Data: No additional findings.  ROS:  All other systems negative, except as noted in the HPI. Review of Systems  Objective: Vital Signs: LMP  (LMP Unknown) Comment: pmb last 2-3 months  Specialty Comments:  No specialty comments available.  PMFS History: Patient Active Problem List  Diagnosis Date Noted   Knee pain 12/25/2023   Chronic diarrhea 08/07/2022   Breast hypertrophy 12/20/2021   Chronic diastolic (congestive) heart failure (HCC) 06/10/2020   Restrictive lung disease 05/11/2020   RVF (right ventricular failure) (HCC)    Exertional dyspnea    Chronic hypoxemic respiratory failure (HCC) 11/20/2019   OSA on CPAP 11/20/2019   Complex care coordination 11/20/2019   Morbid obesity with BMI of 45.0-49.9, adult (HCC) 11/20/2019   Pulmonary hypertension (HCC) 10/30/2019   Gout 10/30/2019   CKD (chronic kidney disease) stage 3, GFR 30-59 ml/min (HCC) 10/30/2019   Past Medical History:  Diagnosis Date   Acute respiratory failure due to COVID-19 (HCC) 10/30/2019   Anxiety    Arthritis    CHF (congestive heart failure) (HCC)    CKD (chronic kidney disease), stage III (HCC)    Complication of anesthesia    hard to wake up after general anesthesia - had trouble waking up after succinylcholine (consider  possibility of pseudocholinesterase deficiency)   COVID-19 virus detected 11/20/2019   Tested positive for Covid in December/2020 Required hospitalization in December/2020 through January/2021 Received Redemsivir   Depression    DVT (deep venous thrombosis) (HCC) 06/09/2020   RLE   Dyspnea    Family history of adverse reaction to anesthesia    hard to wake up after general anesthesia   GERD (gastroesophageal reflux disease)    HLD (hyperlipidemia)    HTN (hypertension)    Pre-diabetes    no meds   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; 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, although unsure if she had confirmation testing done   Pulmonary HTN (HCC)    Sleep apnea    on CPAP   Trichomonas infection     Family History  Problem Relation Age of Onset   COPD Father    Cancer Mother        gallbladder and bladder    Hypertension Brother    Breast cancer Other    Diabetes Son    Breast cancer Cousin    Breast cancer Cousin    Pulmonary Hypertension Neg Hx     Past Surgical History:  Procedure Laterality Date   BREAST REDUCTION SURGERY Bilateral 12/20/2021   Procedure: bilateral breast reduction with free nipple graft;  Surgeon: Rodman Clam, MD;  Location: [facility] OR;  Service: Plastics;  Laterality: Bilateral;  4 hours   CARPAL TUNNEL RELEASE Bilateral    CESAREAN SECTION     COLONOSCOPY WITH PROPOFOL  N/A 03/12/2020   Procedure: COLONOSCOPY WITH PROPOFOL ;  Surgeon: Alvis Jourdain, MD;  Location: [facility]COPY;  Service: Endoscopy;  Laterality: N/A;   DILATATION & CURETTAGE/HYSTEROSCOPY WITH MYOSURE N/A 04/08/2020   Procedure: DILATATION & CURETTAGE/HYSTEROSCOPY WITH MYOSURE;  Surgeon: Lavoie, Marie-Lyne, MD;  Location: [facility] OR;  Service: Gynecology;  Laterality: N/A;  request 8:30am OR start-requested through [facility] for [facility] requests one hour   HEMOSTASIS CLIP PLACEMENT  03/12/2020   Procedure: HEMOSTASIS CLIP PLACEMENT;   Surgeon: Alvis Jourdain, MD;  Location: [facility];  Service: Endoscopy;;   LEG SURGERY Right 1988   metal rod   lumbar surgery     POLYPECTOMY  03/12/2020   Procedure: POLYPECTOMY;  Surgeon: Alvis Jourdain, MD;  Location: [facility];  Service: Endoscopy;;   RIGHT HEART CATH N/A 12/18/2019   Procedure: RIGHT HEART CATH;  Surgeon: Darlis Eisenmenger, MD;  [facility]: [facility] INVASIVE CV LAB;  Service: Cardiovascular;  Laterality: N/A;   RIGHT  HEART CATH N/A 07/12/2020   Procedure: RIGHT HEART CATH;  Surgeon: Darlis Eisenmenger, MD;  Location: [facility] INVASIVE CV LAB;  Service: Cardiovascular;  Laterality: N/A;   Social History   Occupational History   Not on file  Tobacco Use   Smoking status: Never   Smokeless tobacco: Never  Vaping Use   Vaping status: Never Used  Substance and Sexual Activity   Alcohol use: Not Currently   Drug use: Not Currently   Sexual activity: Not Currently    Partners: Male    Birth control/protection: Post-menopausal    Comment: 1st intercourse- 3, partners- more than 5

## 2024-03-07 NOTE — Telephone Encounter (Signed)
 I called better now no more gel

## 2024-03-10 ENCOUNTER — Ambulatory Visit (HOSPITAL_COMMUNITY)
Admission: RE | Admit: 2024-03-10 | Discharge: 2024-03-10 | Disposition: A | Discharge status: Home / Self Care | Source: Ambulatory Visit | Attending: Adult Health | Admitting: Adult Health

## 2024-03-10 DIAGNOSIS — D649 Anemia, unspecified: Secondary | ICD-10-CM | POA: Diagnosis not present

## 2024-03-10 DIAGNOSIS — I272 Pulmonary hypertension, unspecified: Secondary | ICD-10-CM | POA: Diagnosis present

## 2024-03-10 LAB — CBC
HCT: 49.9 % — ABNORMAL HIGH (ref 36.0–46.0)
Hemoglobin: 16.3 g/dL — ABNORMAL HIGH (ref 12.0–15.0)
MCH: 30.8 pg (ref 26.0–34.0)
MCHC: 32.7 g/dL (ref 30.0–36.0)
MCV: 94.3 fL (ref 80.0–100.0)
Platelets: 246 10*3/uL (ref 150–400)
RBC: 5.29 MIL/uL — ABNORMAL HIGH (ref 3.87–5.11)
RDW: 15.2 % (ref 11.5–15.5)
WBC: 4.7 10*3/uL (ref 4.0–10.5)
nRBC: 0 % (ref 0.0–0.2)

## 2024-03-10 NOTE — Progress Notes (Signed)
 Repeat CBC after holding Winrevair  shows HgB 16.3, down from 16.6 three weeks ago. This is still >ULN of 15.0 but patient does have history of running in the 13-15 range. Per package insert, dose should be held and repeat CBC ordered in 3 weeks. Would you favor holding for another 3 weeks and repeating CBC again, or resuming Winrevair  and just continuing the 0.3 mg/kg dose with another CBC in 3 weeks?

## 2024-03-11 ENCOUNTER — Telehealth (HOSPITAL_COMMUNITY): Payer: Self-pay

## 2024-03-11 ENCOUNTER — Other Ambulatory Visit (HOSPITAL_COMMUNITY): Payer: Self-pay

## 2024-03-11 ENCOUNTER — Ambulatory Visit (HOSPITAL_COMMUNITY): Payer: Self-pay | Admitting: Pharmacist

## 2024-03-11 DIAGNOSIS — I270 Primary pulmonary hypertension: Secondary | ICD-10-CM

## 2024-03-11 NOTE — Progress Notes (Signed)
 Called patient and she is aware that we will resume Winrevair  at 0.3 mg/kg (0.6 mL) every 3 weeks and not uptitrate per Dr. Mitzie Anda. Will repeat CBC in 3 weeks after next dose (scheduled for 04/01/24).  Provided verbal orders to [facility]. Also updated her Specialty Pharmacy nurse as patient had wanted a second nurse visit regarding injection technique. Medication list updated.

## 2024-03-11 NOTE — Telephone Encounter (Signed)
 Contacted patient to update her on CBC result showing Hgb stable but elevated at 16.1 mg/dL. Per Dr. Mitzie Anda, the plan now is to resume Winrevair  at the starting dose of 0.3 mg/kg (30.48 mg; 0.6 mL) every 3 weeks. Patient did receive 2x 45 mg kits in anticipation of using the higher dose but did not administer after being instructed to hold her dose due to elevated Hgb. Patient was OK with still using the kits with the updated instruction of 0.3 mg/kg (0.6 mL) every 3 weeks. I informed the patient that the plan was to have a specialty pharmacy nurse come to her house to provide additional education for the self injection. Patient was comfortable with the plan and agreeable to have follow-up CBC checked in 3 weeks.  [facility] was contacted previously with updated prescription instructions and plans to continue 0.3 mg/kg (0.6 mL; dosing weight 101.6 kg) every 3 weeks for now.   Albino Alu, PharmD PGY2 Cardiology Pharmacy Resident

## 2024-03-18 ENCOUNTER — Encounter (HOSPITAL_COMMUNITY): Payer: Self-pay

## 2024-03-18 ENCOUNTER — Ambulatory Visit (HOSPITAL_COMMUNITY)
Admission: RE | Admit: 2024-03-18 | Discharge: 2024-03-18 | Disposition: A | Discharge status: Home / Self Care | Source: Ambulatory Visit | Attending: Internal Medicine | Admitting: Internal Medicine

## 2024-03-18 ENCOUNTER — Ambulatory Visit (HOSPITAL_COMMUNITY): Payer: Self-pay | Admitting: Family Medicine

## 2024-03-18 ENCOUNTER — Ambulatory Visit (HOSPITAL_COMMUNITY): Payer: Self-pay | Admitting: Cardiology

## 2024-03-18 ENCOUNTER — Ambulatory Visit (HOSPITAL_BASED_OUTPATIENT_CLINIC_OR_DEPARTMENT_OTHER)
Admission: RE | Admit: 2024-03-18 | Discharge: 2024-03-18 | Disposition: A | Discharge status: Home / Self Care | Source: Ambulatory Visit | Attending: Family Medicine | Admitting: Family Medicine

## 2024-03-18 VITALS — BP 134/92 | HR 78 | Wt 220.4 lb

## 2024-03-18 DIAGNOSIS — M25569 Pain in unspecified knee: Secondary | ICD-10-CM | POA: Insufficient documentation

## 2024-03-18 DIAGNOSIS — Z7901 Long term (current) use of anticoagulants: Secondary | ICD-10-CM | POA: Insufficient documentation

## 2024-03-18 DIAGNOSIS — I272 Pulmonary hypertension, unspecified: Secondary | ICD-10-CM

## 2024-03-18 DIAGNOSIS — Z9981 Dependence on supplemental oxygen: Secondary | ICD-10-CM | POA: Insufficient documentation

## 2024-03-18 DIAGNOSIS — I5032 Chronic diastolic (congestive) heart failure: Secondary | ICD-10-CM

## 2024-03-18 DIAGNOSIS — G4733 Obstructive sleep apnea (adult) (pediatric): Secondary | ICD-10-CM | POA: Insufficient documentation

## 2024-03-18 DIAGNOSIS — Z006 Encounter for examination for normal comparison and control in clinical research program: Secondary | ICD-10-CM

## 2024-03-18 DIAGNOSIS — Z7984 Long term (current) use of oral hypoglycemic drugs: Secondary | ICD-10-CM | POA: Diagnosis not present

## 2024-03-18 DIAGNOSIS — E669 Obesity, unspecified: Secondary | ICD-10-CM | POA: Insufficient documentation

## 2024-03-18 DIAGNOSIS — N183 Chronic kidney disease, stage 3 unspecified: Secondary | ICD-10-CM | POA: Diagnosis not present

## 2024-03-18 DIAGNOSIS — I130 Hypertensive heart and chronic kidney disease with heart failure and stage 1 through stage 4 chronic kidney disease, or unspecified chronic kidney disease: Secondary | ICD-10-CM | POA: Diagnosis not present

## 2024-03-18 DIAGNOSIS — Z86718 Personal history of other venous thrombosis and embolism: Secondary | ICD-10-CM | POA: Insufficient documentation

## 2024-03-18 DIAGNOSIS — I071 Rheumatic tricuspid insufficiency: Secondary | ICD-10-CM | POA: Diagnosis not present

## 2024-03-18 DIAGNOSIS — I10 Essential (primary) hypertension: Secondary | ICD-10-CM

## 2024-03-18 DIAGNOSIS — J984 Other disorders of lung: Secondary | ICD-10-CM | POA: Insufficient documentation

## 2024-03-18 DIAGNOSIS — Z79899 Other long term (current) drug therapy: Secondary | ICD-10-CM | POA: Diagnosis not present

## 2024-03-18 DIAGNOSIS — G8929 Other chronic pain: Secondary | ICD-10-CM

## 2024-03-18 DIAGNOSIS — M25561 Pain in right knee: Secondary | ICD-10-CM

## 2024-03-18 DIAGNOSIS — Z6841 Body Mass Index (BMI) 40.0 and over, adult: Secondary | ICD-10-CM | POA: Insufficient documentation

## 2024-03-18 DIAGNOSIS — Z5986 Financial insecurity: Secondary | ICD-10-CM | POA: Diagnosis not present

## 2024-03-18 LAB — ECHOCARDIOGRAM COMPLETE
AR max vel: 2.5 cm2
AV Area VTI: 2.46 cm2
AV Area mean vel: 2.46 cm2
AV Mean grad: 4 mmHg
AV Peak grad: 8.2 mmHg
Ao pk vel: 1.43 m/s
Area-P 1/2: 3.33 cm2
Calc EF: 50.5 %
S' Lateral: 2.8 cm
Single Plane A2C EF: 38.3 %
Single Plane A4C EF: 56.9 %

## 2024-03-18 LAB — BASIC METABOLIC PANEL WITH GFR
Anion gap: 10 (ref 5–15)
BUN: 38 mg/dL — ABNORMAL HIGH (ref 6–20)
CO2: 21 mmol/L — ABNORMAL LOW (ref 22–32)
Calcium: 9.4 mg/dL (ref 8.9–10.3)
Chloride: 106 mmol/L (ref 98–111)
Creatinine, Ser: 2.04 mg/dL — ABNORMAL HIGH (ref 0.44–1.00)
GFR, Estimated: 28 mL/min — ABNORMAL LOW (ref >60)
Glucose, Bld: 84 mg/dL (ref 70–99)
Potassium: 4.3 mmol/L (ref 3.5–5.1)
Sodium: 137 mmol/L (ref 135–145)

## 2024-03-18 LAB — HEMOGLOBIN A1C
Hgb A1c MFr Bld: 5.3 % (ref 4.8–5.6)
Mean Plasma Glucose: 105.41 mg/dL

## 2024-03-18 LAB — BRAIN NATRIURETIC PEPTIDE: B Natriuretic Peptide: 81.5 pg/mL (ref 0.0–100.0)

## 2024-03-18 NOTE — Progress Notes (Signed)
  Echocardiogram 2D Echocardiogram has been performed.  Royden Corin 03/18/2024, 9:47 AM

## 2024-03-18 NOTE — Progress Notes (Signed)
 6 Min Walk Test Completed  Pt ambulated 500 ft (152.24m) O2 Sat ranged 86-90 on 4L oxygen HR ranged 90-103

## 2024-03-18 NOTE — Research (Signed)
 SITE: [id number]     Subject # [id number]   Subprotocol: A  Inclusion Criteria  Patients who meet all of the following criteria are eligible for enrollment as study participants:  Yes No  Age > 57 years old X   Eligible to wear Holter Study X    Exclusion Criteria  Patients who meet any of these criteria are not eligible for enrollment as study participants: Yes No  1. Receiving any mechanical (respiratory or circulatory) or renal support therapy at Screening or during Visit #1.  X  2.  Any other conditions that in the opinion of the investigators are likely to prevent compliance with the study protocol or pose a safety concern if the subject participates in the study.  X  3. Poor tolerance, namely susceptible to severe skin allergies from ECG adhesive patch application.  X   Protocol: REV H    60 minute start window         Cor device must be applied, and the study initiated, no later than 60 minutes of completing the Echocardiogram                             HH:MM  Echo completion time  09:47  2.   Cor Study start time  10:02   30-Minute execution window  Once Cor Monitoring begins, 3 QT Med ECGs and the 15-minute rest period must be completed within a 30 minute window     HH:MM  3. QT Med ECG Completion time  10:05  4. Start of 15-Min sitting rest period  10:05  5. End of 15-Min rest period  10:20  6. Time of device removal  10:37   *Continue to use the Mobile App Event feature to log the Rest period windows and follow instructions on the EF-ACT Clinical Trial  Patient Instruction Card.  Describe any anomalies in Protocol execution in the Protocol Deviation Log    Residential Zip code [phone] (First 3 digits ONLY)                                           [facility] Informed Consent   Subject Name: Dawn Fowler  Subject met inclusion and exclusion criteria.  The informed consent form, study requirements and expectations were reviewed with the subject. Subject had opportunity to  read consent and questions and concerns were addressed prior to the signing of the consent form.  The subject verbalized understanding of the trial requirements.  The subject agreed to participate in the [facility] EF ACT trial and signed the informed consent at 09:50 on 18-Mar-2024.  The informed consent was obtained prior to performance of any protocol-specific procedures for the subject.  A copy of the signed informed consent was given to the subject and a copy was placed in the subject's medical record.   Jolee Naval          Current Outpatient Medications:    acetaminophen  (TYLENOL ) 500 MG tablet, Take 1,000 mg by mouth every 6 (six) hours as needed for moderate pain or headache., Disp: , Rfl:    albuterol  (VENTOLIN  HFA) 108 (90 Base) MCG/ACT inhaler, Inhale 2 puffs into the lungs every 6 (six) hours as needed for wheezing or shortness of breath., Disp: 8 g, Rfl: 5   allopurinol  (ZYLOPRIM ) 100 MG tablet, TAKE 1 TABLET(100 MG) BY  MOUTH DAILY, Disp: 30 tablet, Rfl: 2   apixaban  (ELIQUIS ) 5 MG TABS tablet, Take 1 tablet (5 mg total) by mouth 2 (two) times daily., Disp: 60 tablet, Rfl: 9   BREO ELLIPTA  100-25 MCG/ACT AEPB, INHALE 1 PUFF INTO THE LUNGS DAILY, Disp: 60 each, Rfl: 0   Calcium Carbonate Antacid (TUMS E-X PO), Take 1-2 tablets by mouth 3 (three) times daily as needed (gas/abdominal pain.)., Disp: , Rfl:    Cholecalciferol  (VITAMIN D3) 125 MCG (5000 UT) CAPS, Take 5,000 Units by mouth in the morning., Disp: , Rfl:    colchicine 0.6 MG tablet, Take 0.6 mg by mouth daily as needed (gout flare)., Disp: , Rfl:    diclofenac Sodium (VOLTAREN) 1 % GEL, Apply 2 g topically 3 (three) times daily as needed (pain.)., Disp: , Rfl:    diphenhydramine -acetaminophen  (TYLENOL  PM) 25-500 MG TABS tablet, Take 2 tablets by mouth at bedtime as needed (sleep)., Disp: , Rfl:    empagliflozin (JARDIANCE) 10 MG TABS tablet, Take 10 mg by mouth daily., Disp: , Rfl:    gabapentin  (NEURONTIN ) 100 MG capsule,  Take 100 mg by mouth as needed., Disp: , Rfl:    ipratropium-albuterol  (DUONEB) 0.5-2.5 (3) MG/3ML SOLN, INHALE THE CONTENTS OF 1 VIAL VIA NEBULIZER EVERY 6 HOURS AS NEEDED FOR WHEEZING OR SHORTNESS OF BREATH, Disp: 1080 mL, Rfl: 2   macitentan  (OPSUMIT ) 10 MG tablet, Take 1 tablet (10 mg total) by mouth daily., Disp: 90 tablet, Rfl: 3   OXYGEN, Inhale 4-8 L into the lungs continuous., Disp: , Rfl:    potassium chloride  SA (KLOR-CON  M) 20 MEQ tablet, Take 1 tablet (20 mEq total) by mouth daily., Disp: 90 tablet, Rfl: 3   Selexipag  (UPTRAVI ) 800 MCG TABS, Take 1 tablet (800 mcg total) by mouth 2 (two) times daily., Disp: 60 tablet, Rfl: 0   sertraline  (ZOLOFT ) 50 MG tablet, Take 1 tablet (50 mg total) by mouth 2 (two) times daily., Disp: 60 tablet, Rfl: 0   sotatercept (WINREVAIR ) subcutaneous injection, Inject 0.3 mg/kg into the skin every 21 ( twenty-one) days. Dose = 0.6 mL; dosing weight = 101.6 kg, Disp: , Rfl:    spironolactone  (ALDACTONE ) 25 MG tablet, TAKE 1/2 TABLET(12.5 MG) BY MOUTH DAILY, Disp: 45 tablet, Rfl: 3   tadalafil , PAH, (ADCIRCA ) 20 MG tablet, Patient takes 2 tablets by mouth daily., Disp: 60 tablet, Rfl: 1   torsemide  (DEMADEX ) 20 MG tablet, TAKE 2 TABLETS(40 MG) BY MOUTH TWICE DAILY, Disp: 120 tablet, Rfl: 0   vitamin B-12 (CYANOCOBALAMIN) 50 MCG tablet, Take 50 mcg by mouth daily., Disp: , Rfl:

## 2024-03-18 NOTE — Patient Instructions (Addendum)
 No change in medications today. Labs today - will call you if abnormal. Referral sent to Pharmacy Clinic for GLP - they will contact you to schedule first appointment. Referral sent to PREP Program at [facility] - see below. Return to see Dr. Mitzie Anda in 2 months - see below. Please call us  at [phone] if any questions or concern prior to your next visit.

## 2024-03-18 NOTE — Progress Notes (Signed)
 ID:  Dawn Fowler, DOB 1966/04/20, MRN [id number]   Provider location: [facility] Type of Visit: Established patient   PCP:  Allene Ivan, MD  Cardiologist:  Dr. Mitzie Anda   History of Present Illness: Dawn Fowler is a 58 y.o. 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  She is on CPAP at home and 6L home oxygen (says she has been on oxygen for "years.").  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  She additionally has significant HTN.  She had been on Opsumit  10 + tadalafil  20 mg daily but ran out of this prior to her 2/21 admission. She also has a history of prior DVT after long train ride, has had 2 negative V/Q scans.  She was hospitalized for COVID-19 at [facility] in 1/21.    She was seen by [facility] after discharge from COVID admission and was noted to be volume overloaded, Lasix  was changed to torsemide  but she was unable to get to pharmacy to pick up. Subsequently, she developed progressive wt gain, nearly 20 lb and increased dyspnea, prompting her to report back to the ED in 2/21, where she was found to be in acute on chronic diastolic HF and readmitted for IV diuresis and AHF consultation. Echo in 2/21 showed LVEF 55-60%, moderate RV dilation/moderate RV dysfunction. RHC 2/21 showed elevated left and right heart filling pressures with severe pulmonary hypertension. PFTs completed showing severe restriction and severely decreased DLCO. High resolution CT was concerning  for small vessel disease, no ILD, and concerning for PAH.  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 She was continued on IV diuretics and had good response, diuresing 32 lb. She was transitioned  to torsemide  40 mg/20 mg. Opsumit  and tadalafil  restarted.   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  She is on Eliquis . RHC in 9/21 showed normal RA pressure and PCWP, moderate-severe pulmonary arterial hypertension.   Echo 5/22 showed EF 55% with mildly enlarged RV with mild RV dysfunction, mildly D-shaped septum, PASP 48 with normal IVC.   In 2/23, she had bilateral breast reduction surgery. She developed significant volume overload and her torsemide  was increased.  She moved to [place]  and had RHC there in 2/25; normal PCWP and RA pressure but PA 87/32 mean 54 with PVR 11 WU and PCWP 15, CI 1.8 (thermo). V/Q scan in 2/25 showed no sign of chronic PEs.   Follow up 2/25, recently moved back to [place] from [place] . Sotatercept initiated given ongoing significantly elevated PA pressures.   Today she returns for HF follow up. Overall feeling fine. She is not SOB walking on flat ground with her rolling walker on 4 L oxygen. Rare dizziness, no falls. Has swelling in knees. Denies palpitations, abnormal bleeding, CP, or PND/Orthopnea. Appetite ok. Weight at home 220 pounds. Taking all medications. Uses 4L oxygen at night & CPAP. Considering right knee replacement surgery, has seen Dr. Rozelle Corning. Has not had sotatercept injection since initial injection in 2/25 as hgb elevated.  Echo today 03/18/24, images briefly reviewed with Dr Mitzie Anda, showed EF preserved, PA pressures remains elevated, D shaped septum suggesting continued RV dysfunction.  Labs (2/23): K 3.8, creatinine 2.01 Labs (3/23): K 4.1, creatinine 2.19 Labs (5/23): K 4.0, creatinine 1.9 Labs (1/25): K 3.8,c reatinine 1.86, hgb 15.6  Labs (7/23): K 4.4, creatinine 1.87  6 minute walk (9/21): 168 m 6 minute walk (10/21): 122 m 6 minute walk (3/22): 107 m 6 minute walk (2/25): 229 m 6 minute walk today 03/18/24: 152 m, oxygen sats 90%-86% on 4 L  PMH: 1. OHS/OSA: Uses home oxygen.  2. CKD stage IV 3. COVID-19 PNA  in 12/20 4. Restrictive lung disease: PFTs (2/21) with severe restriction, severely decreased DLCO.  - High resolution CT chest (2/21): no interstitial lung disease, "small vessel disease."  - Breast reduction surgery 2/23 5. Gout 6. HTN 7. DVT: Remote, after train ride.  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  8. Pulmonary hypertension: RHC (2/21) with mean RA 14, PA 89/33 mean 54, mean PCWP 34, CI 2.22, PVR 3.87 WU => mixed pulmonary arterial/pulmonary venous hypertension.  - V/Q scan (2/21): No evidence for chronic PE.  - Echo (2/21): EF 55-60%, moderately dilated RV with moderately decreased systolic function.  - HIV and rheumatologic serologies negative.  - RHC (9/21): mean RA 9, PA 68/23 mean 40, mean PCWP 11, PVR 5.6 WU, no step up in oxygen saturation so no evidence for left to right shunting.  - Echo (5/22): EF 55% with mildly enlarged RV with mild RV dysfunction, mildly D-shaped septum, PASP 48 with normal IVC - RHC (2/25): mean RA 7, PA 87/32 mean 54, mean PCWP 15, CI thermo 1.8, PVR 11 WU. - Echo (5/25): preserved EF, PA pressure ~ 60, RV dysfunction with D shaped septum 9. Depression   Current Outpatient Medications  Medication Sig Dispense Refill   acetaminophen  (TYLENOL ) 500 MG tablet Take 1,000 mg by mouth every 6 (six) hours as needed for moderate pain or headache.     albuterol  (VENTOLIN  HFA) 108 (90 Base) MCG/ACT inhaler Inhale 2 puffs into the lungs every 6 (six) hours as needed for wheezing or shortness of breath. 8 g 5   allopurinol  (ZYLOPRIM ) 100 MG tablet TAKE 1 TABLET(100 MG) BY MOUTH DAILY 30 tablet 2   apixaban  (ELIQUIS ) 5 MG TABS tablet Take 1 tablet (5 mg total) by mouth 2 (two) times daily. 60 tablet 9   BREO ELLIPTA  100-25 MCG/ACT AEPB INHALE 1 PUFF INTO THE LUNGS DAILY 60 each 0   Calcium Carbonate Antacid (TUMS E-X PO) Take 1-2 tablets by mouth 3 (three) times daily as needed (gas/abdominal pain.).     Cholecalciferol  (VITAMIN D3) 125 MCG  (5000 UT) CAPS Take 5,000 Units by mouth in the morning.     colchicine 0.6 MG tablet Take 0.6 mg by mouth daily as needed (gout flare).     diclofenac Sodium (VOLTAREN) 1 % GEL Apply 2 g topically 3 (three) times daily as needed (pain.).     diphenhydramine -acetaminophen  (TYLENOL  PM) 25-500 MG TABS tablet Take 2 tablets by mouth at bedtime as needed (sleep).     empagliflozin (JARDIANCE) 10 MG TABS tablet Take 10 mg by mouth daily.     gabapentin  (NEURONTIN ) 100 MG capsule Take 100 mg by mouth as needed.     ipratropium-albuterol  (DUONEB) 0.5-2.5 (3) MG/3ML SOLN INHALE THE CONTENTS OF 1 VIAL VIA NEBULIZER EVERY 6 HOURS AS NEEDED FOR WHEEZING OR SHORTNESS OF BREATH 1080 mL 2   macitentan  (OPSUMIT ) 10 MG tablet Take 1 tablet (10 mg total) by mouth daily. 90 tablet 3  OXYGEN Inhale 4-8 L into the lungs continuous.     potassium chloride  SA (KLOR-CON  M) 20 MEQ tablet Take 1 tablet (20 mEq total) by mouth daily. 90 tablet 3   Selexipag  (UPTRAVI ) 800 MCG TABS Take 1 tablet (800 mcg total) by mouth 2 (two) times daily. (Patient taking differently: Take 0.5 tablets by mouth 2 (two) times daily.) 60 tablet 0   sertraline  (ZOLOFT ) 50 MG tablet Take 1 tablet (50 mg total) by mouth 2 (two) times daily. 60 tablet 0   sotatercept (WINREVAIR ) subcutaneous injection Inject 0.3 mg/kg into the skin every 21 ( twenty-one) days. Dose = 0.6 mL; dosing weight = 101.6 kg     spironolactone  (ALDACTONE ) 25 MG tablet TAKE 1/2 TABLET(12.5 MG) BY MOUTH DAILY 45 tablet 3   tadalafil , PAH, (ADCIRCA ) 20 MG tablet Patient takes 2 tablets by mouth daily. 60 tablet 1   torsemide  (DEMADEX ) 20 MG tablet TAKE 2 TABLETS(40 MG) BY MOUTH TWICE DAILY 120 tablet 0   vitamin B-12 (CYANOCOBALAMIN) 50 MCG tablet Take 50 mcg by mouth daily.     No current facility-administered medications for this encounter.    Allergies  Allergen Reactions   Adhesive [Tape] Hives    Tolerates paper tape   Succinylcholine Other (See Comments)    Have  trouble waking up   Tizanidine Hives      Social History   Socioeconomic History   Marital status: Single    Spouse name: Not on file   Number of children: Not on file   Years of education: Not on file   Highest education level: Not on file  Occupational History   Not on file  Tobacco Use   Smoking status: Never   Smokeless tobacco: Never  Vaping Use   Vaping status: Never Used  Substance and Sexual Activity   Alcohol use: Not Currently   Drug use: Not Currently   Sexual activity: Not Currently    Partners: Male    Birth control/protection: Post-menopausal    Comment: 1st intercourse- 19, partners- more than 5  Other Topics Concern   Not on file  Social History Narrative   Not on file   Social Drivers of Health   Financial Resource Strain: High Risk (07/12/2022)   Overall Financial Resource Strain (CARDIA)    Difficulty of Paying Living Expenses: Very hard  Food Insecurity: Food Insecurity Present (04/11/2022)   Hunger Vital Sign    Worried About Running Out of Food in the Last Year: Sometimes true    Ran Out of Food in the Last Year: Never true  Transportation Needs: No Transportation Needs (11/11/2020)   PRAPARE - Administrator, Civil Service (Medical): No    Lack of Transportation (Non-Medical): No  Physical Activity: Not on file  Stress: Not on file  Social Connections: Not on file  Intimate Partner Violence: Not At Risk (12/20/2023)   Received from [facility]   LH Intimate Violence    Are you afraid or have you ever been threatened by a current partner, family member, or household member?: No    Within the past year, have you ever been hit, slapped, choked, forced into sexual activity or otherwise hurt by a current partner or family member?: No     Family History  Problem Relation Age of Onset   COPD Father    Cancer Mother        gallbladder and bladder    Hypertension Brother    Breast cancer Other  Diabetes Son    Breast cancer  Cousin    Breast cancer Cousin    Pulmonary Hypertension Neg Hx    BP (!) 134/92   Pulse 78   Wt 100 kg (220 lb 6.4 oz)   LMP  (LMP Unknown) Comment: pmb last 2-3 months  SpO2 94%   BMI 41.64 kg/m   Wt Readings from Last 3 Encounters:  03/18/24 100 kg (220 lb 6.4 oz)  12/04/22 101.6 kg (224 lb)  08/04/22 108 kg (238 lb)   PHYSICAL EXAM:  General:  NAD. No resp difficulty, walked into clinic with RW on 4 L oxygen HEENT: Normal Neck: Supple. No JVD. Cor: Regular rate & rhythm. No rubs, gallops or murmurs. Lungs: Diminished in bases Abdomen: Soft, obese, nontender, nondistended.  Extremities: No cyanosis, clubbing, rash, edema Neuro: Alert & oriented x 3, moves all 4 extremities w/o difficulty. Affect pleasant.  ASSESSMENT & PLAN: 1. Chronic diastolic CHF with significant RV dysfunction: Echo in 2/21 with LVEF 55-60%, moderate RV dilation/moderate RV dysfunction.  RHC 2/18 showed elevated left and right heart filling pressures with severe pulmonary hypertension.  Suspect mixed pulmonary venous/pulmonary arterial hypertension in setting of significant LV diastolic dysfunction (PVR 3.87 WU).  Repeat RHC in 9/21 with moderate-severe PAH, normal filling pressures, and PVR 5.6 WU.  No evidence for left=>right shunting.  Echo in 5/22 showed EF 55% with mildly enlarged RV with mild RV dysfunction, mildly D-shaped septum, PASP 48 with normal IVC.  Echo today 03/18/24, preserved EF, continues with RV dysfunction and elevated PA pressures. NYHA class II-III.  She is not volume overloaded on exam today.  - Continue torsemide  40 mg bid. BMET/BNP today.  - Continue spiro 12.5 mg daily. - Continue Farxiga  10 mg daily.  2. Pulmonary hypertension: Suspect that this is a mixed picture with group 2 and group 3 PH (related to diastolic LV dysfunction and OHS/OSA).  However, concerned for component of group 1 PAH as well.  V/Q scan not suggestive of chronic PEs and high resolution CT chest in 2/21 was not  suggestive of ILD. HIV and rheumatologic serologies negative.  RHC showed severe mixed pulmonary venous/pulmonary arterial hypertension, repeat RHC after diuresis in 9/21 showed PAH. PFTs completed in 2/21 showed severe restriction, severely decreased DLCO (?restriction due to body habitus).  Echo in 5/22 showed mild improvement in RV and improvement in estimated PA systolic pressure.  V/Q scan in 2/25 again showed no evidence for chronic PE, and RHC in 2/25 showed normal RA pressure, severe pulmonary arterial hypertension with PVR 11 WU, and PCWP borderline at 15 with low CI.  She is on Opsumit , tadalafil  and Uptravi  800 mcg bid.  6 minute walk today mildly worse compared to 2/25.  - Continue to maintain oxygen saturation with home O2 and CPAP. Weight loss also would be helpful.  She has had a breast reduction surgery which may decrease lung restriction.  - Continue Opsumit  10 mg daily. - Continue tadalafil  40 mg daily.  - She is tolerating Uptravi  800 mcg bid now, she has not tolerated uptitration.  - She is now on sotatercept given ongoing significantly elevated PA pressure; has received initial dose 2/25, no dose titration given hgb > 15. Planning 2nd dose soon. 3. HTN: Controlled.  - Continue current meds. 4. CKD stage 3. Continue SGLT2i. BMET today.  - Followup with nephrology.  5. OHS/OSA: Now s/p breast reduction surgery which hopefully will help with OHS.   - Compliant w/ CPAP and home oxygen,  no change. 6. 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  As she is at baseline sedentary and has had recurrent DVTs, would continue Eliquis  long-term.   7. Restrictive lung disease: Now s/p breast reduction surgery.  8. Obesity: Body mass index is 41.64 kg/m. - Continue to work on weight loss.   - Refer to PharmD for  GLP-1 agonist. - Refer to PREP program at [facility], per her request. 9. Knee pain: Has seen Dr. Rozelle Corning, considering R total knee replacement. She would be at least moderate, if not  high CV risk for surgery, given co-morbidities, including to severe Pulm Htn. - If surgery is offered, favor having it at [facility] OR.  Follow up in 2 months with Dr. Mitzie Anda.  Signed, Elmarie Hacking, FNP  03/18/2024  [facility] [address] [facility] [place] [address] [phone] (office) [phone] (fax)

## 2024-03-19 NOTE — Telephone Encounter (Addendum)
 Pt aware, agreeable, and verbalized understanding   ----- Message from Peder Bourdon sent at 03/18/2024  3:10 PM EDT ----- LV EF normal, moderate RV dysfunction, mild-moderate pulmonary hypertension.

## 2024-03-25 ENCOUNTER — Other Ambulatory Visit: Payer: Self-pay | Admitting: Radiology

## 2024-03-25 DIAGNOSIS — M1711 Unilateral primary osteoarthritis, right knee: Secondary | ICD-10-CM

## 2024-03-27 NOTE — Progress Notes (Signed)
 ID:  Dawn Fowler, DOB 05-Dec-1965, MRN [id number]   Provider location: [facility] Type of Visit: Established patient   PCP:  Allene Ivan, MD  Cardiologist:  Dr. Mitzie Anda   History of Present Illness: Dawn Fowler is a 58 y.o. 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  She is on CPAP at home and 6L home oxygen (says she has been on oxygen for "years.").  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  She additionally has significant HTN.  She had been on Opsumit  10 + tadalafil  20 mg daily but ran out of this prior to her 2/21 admission. She also has a history of prior DVT after long train ride, has had 2 negative V/Q scans.  She was hospitalized for COVID-19 at [facility] in 1/21.    She was seen by [facility] after discharge from COVID admission and was noted to be volume overloaded, Lasix  was changed to torsemide  but she was unable to get to pharmacy to pick up. Subsequently, she developed progressive wt gain, nearly 20 lb and increased dyspnea, prompting her to report back to the ED in 2/21, where she was found to be in acute on chronic diastolic HF and readmitted for IV diuresis and AHF consultation. Echo in 2/21 showed LVEF 55-60%, moderate RV dilation/moderate RV dysfunction. RHC 2/21 showed elevated left and right heart filling pressures with severe pulmonary hypertension. PFTs completed showing severe restriction and severely decreased DLCO. High resolution CT was concerning  for small vessel disease, no ILD, and concerning for PAH.  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 She was continued on IV diuretics and had good response, diuresing 32 lb. She was transitioned  to torsemide  40 mg/20 mg. Opsumit  and tadalafil  restarted.   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  She is on Eliquis . RHC in 9/21 showed normal RA pressure and PCWP, moderate-severe pulmonary arterial hypertension.   Echo 5/22 showed EF 55% with mildly enlarged RV with mild RV dysfunction, mildly D-shaped septum, PASP 48 with normal IVC.   In 2/23, she had bilateral breast reduction surgery. She developed significant volume overload and her torsemide  was increased.  She moved to [place]  and had RHC there in 2/25; normal PCWP and RA pressure but PA 87/32 mean 54 with PVR 11 WU and PCWP 15, CI 1.8 (thermo). V/Q scan in 2/25 showed no sign of chronic PEs.   Follow up 2/25, recently moved back to [place] from [place] . Sotatercept initiated given ongoing significantly elevated PA pressures.   Today she returns for HF follow up. Overall feeling fine. She is not SOB walking on flat ground with her rolling walker on 4 L oxygen. Rare dizziness, no falls. Has swelling in knees. Denies palpitations, abnormal bleeding, CP, or PND/Orthopnea. Appetite ok. Weight at home 220 pounds. Taking all medications. Uses 4L oxygen at night & CPAP. Considering right knee replacement surgery, has seen Dr. Rozelle Corning. Has not had sotatercept injection since initial injection in 2/25 as hgb elevated.  Echo today 03/18/24, images briefly reviewed with Dr Mitzie Anda, showed EF preserved, PA pressures remains elevated, D shaped septum suggesting continued RV dysfunction.  Labs (2/23): K 3.8, creatinine 2.01 Labs (3/23): K 4.1, creatinine 2.19 Labs (5/23): K 4.0, creatinine 1.9 Labs (1/25): K 3.8,c reatinine 1.86, hgb 15.6  Labs (7/23): K 4.4, creatinine 1.87  6 minute walk (9/21): 168 m 6 minute walk (10/21): 122 m 6 minute walk (3/22): 107 m 6 minute walk (2/25): 229 m 6 minute walk today 03/18/24: 152 m, oxygen sats 90%-86% on 4 L  PMH: 1. OHS/OSA: Uses home oxygen.  2. CKD stage IV 3. COVID-19 PNA  in 12/20 4. Restrictive lung disease: PFTs (2/21) with severe restriction, severely decreased DLCO.  - High resolution CT chest (2/21): no interstitial lung disease, "small vessel disease."  - Breast reduction surgery 2/23 5. Gout 6. HTN 7. DVT: Remote, after train ride.  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  8. Pulmonary hypertension: RHC (2/21) with mean RA 14, PA 89/33 mean 54, mean PCWP 34, CI 2.22, PVR 3.87 WU => mixed pulmonary arterial/pulmonary venous hypertension.  - V/Q scan (2/21): No evidence for chronic PE.  - Echo (2/21): EF 55-60%, moderately dilated RV with moderately decreased systolic function.  - HIV and rheumatologic serologies negative.  - RHC (9/21): mean RA 9, PA 68/23 mean 40, mean PCWP 11, PVR 5.6 WU, no step up in oxygen saturation so no evidence for left to right shunting.  - Echo (5/22): EF 55% with mildly enlarged RV with mild RV dysfunction, mildly D-shaped septum, PASP 48 with normal IVC - RHC (2/25): mean RA 7, PA 87/32 mean 54, mean PCWP 15, CI thermo 1.8, PVR 11 WU. - Echo (5/25): preserved EF, PA pressure ~ 60, RV dysfunction with D shaped septum 9. Depression   Current Outpatient Medications  Medication Sig Dispense Refill   acetaminophen  (TYLENOL ) 500 MG tablet Take 1,000 mg by mouth every 6 (six) hours as needed for moderate pain or headache.     albuterol  (VENTOLIN  HFA) 108 (90 Base) MCG/ACT inhaler Inhale 2 puffs into the lungs every 6 (six) hours as needed for wheezing or shortness of breath. 8 g 5   allopurinol  (ZYLOPRIM ) 100 MG tablet TAKE 1 TABLET(100 MG) BY MOUTH DAILY 30 tablet 2   apixaban  (ELIQUIS ) 5 MG TABS tablet Take 1 tablet (5 mg total) by mouth 2 (two) times daily. 60 tablet 9   BREO ELLIPTA  100-25 MCG/ACT AEPB INHALE 1 PUFF INTO THE LUNGS DAILY 60 each 0   Calcium Carbonate Antacid (TUMS E-X PO) Take 1-2 tablets by mouth 3 (three) times daily as needed (gas/abdominal pain.).     Cholecalciferol  (VITAMIN D3) 125 MCG  (5000 UT) CAPS Take 5,000 Units by mouth in the morning.     colchicine 0.6 MG tablet Take 0.6 mg by mouth daily as needed (gout flare).     diclofenac Sodium (VOLTAREN) 1 % GEL Apply 2 g topically 3 (three) times daily as needed (pain.).     diphenhydramine -acetaminophen  (TYLENOL  PM) 25-500 MG TABS tablet Take 2 tablets by mouth at bedtime as needed (sleep).     empagliflozin (JARDIANCE) 10 MG TABS tablet Take 10 mg by mouth daily.     gabapentin  (NEURONTIN ) 100 MG capsule Take 100 mg by mouth as needed.     ipratropium-albuterol  (DUONEB) 0.5-2.5 (3) MG/3ML SOLN INHALE THE CONTENTS OF 1 VIAL VIA NEBULIZER EVERY 6 HOURS AS NEEDED FOR WHEEZING OR SHORTNESS OF BREATH 1080 mL 2   macitentan  (OPSUMIT ) 10 MG tablet Take 1 tablet (10 mg total) by mouth daily. 90 tablet 3  OXYGEN Inhale 4-8 L into the lungs continuous.     potassium chloride  SA (KLOR-CON  M) 20 MEQ tablet Take 1 tablet (20 mEq total) by mouth daily. 90 tablet 3   Selexipag  (UPTRAVI ) 800 MCG TABS Take 1 tablet (800 mcg total) by mouth 2 (two) times daily. 60 tablet 0   sertraline  (ZOLOFT ) 50 MG tablet Take 1 tablet (50 mg total) by mouth 2 (two) times daily. 60 tablet 0   sotatercept (WINREVAIR ) subcutaneous injection Inject 0.3 mg/kg into the skin every 21 ( twenty-one) days. Dose = 0.6 mL; dosing weight = 101.6 kg     spironolactone  (ALDACTONE ) 25 MG tablet TAKE 1/2 TABLET(12.5 MG) BY MOUTH DAILY 45 tablet 3   tadalafil , PAH, (ADCIRCA ) 20 MG tablet Patient takes 2 tablets by mouth daily. 60 tablet 1   torsemide  (DEMADEX ) 20 MG tablet TAKE 2 TABLETS(40 MG) BY MOUTH TWICE DAILY 120 tablet 0   vitamin B-12 (CYANOCOBALAMIN) 50 MCG tablet Take 50 mcg by mouth daily.     No current facility-administered medications for this visit.    Allergies  Allergen Reactions   Adhesive [Tape] Hives    Tolerates paper tape   Succinylcholine Other (See Comments)    Have trouble waking up   Tizanidine Hives      Social History   Socioeconomic  History   Marital status: Single    Spouse name: Not on file   Number of children: Not on file   Years of education: Not on file   Highest education level: Not on file  Occupational History   Not on file  Tobacco Use   Smoking status: Never   Smokeless tobacco: Never  Vaping Use   Vaping status: Never Used  Substance and Sexual Activity   Alcohol use: Not Currently   Drug use: Not Currently   Sexual activity: Not Currently    Partners: Male    Birth control/protection: Post-menopausal    Comment: 1st intercourse- 69, partners- more than 5  Other Topics Concern   Not on file  Social History Narrative   Not on file   Social Drivers of Health   Financial Resource Strain: High Risk (07/12/2022)   Overall Financial Resource Strain (CARDIA)    Difficulty of Paying Living Expenses: Very hard  Food Insecurity: Food Insecurity Present (04/11/2022)   Hunger Vital Sign    Worried About Running Out of Food in the Last Year: Sometimes true    Ran Out of Food in the Last Year: Never true  Transportation Needs: No Transportation Needs (11/11/2020)   PRAPARE - Administrator, Civil Service (Medical): No    Lack of Transportation (Non-Medical): No  Physical Activity: Not on file  Stress: Not on file  Social Connections: Not on file  Intimate Partner Violence: Not At Risk (12/20/2023)   Received from [facility]   LH Intimate Violence    Are you afraid or have you ever been threatened by a current partner, family member, or household member?: No    Within the past year, have you ever been hit, slapped, choked, forced into sexual activity or otherwise hurt by a current partner or family member?: No     Family History  Problem Relation Age of Onset   COPD Father    Cancer Mother        gallbladder and bladder    Hypertension Brother    Breast cancer Other    Diabetes Son    Breast cancer Cousin  Breast cancer Cousin    Pulmonary Hypertension Neg Hx    LMP  (LMP  Unknown) Comment: pmb last 2-3 months  Wt Readings from Last 3 Encounters:  03/18/24 100 kg (220 lb 6.4 oz)  12/04/22 101.6 kg (224 lb)  08/04/22 108 kg (238 lb)   PHYSICAL EXAM:  General:  NAD. No resp difficulty, walked into clinic with RW on 4 L oxygen HEENT: Normal Neck: Supple. No JVD. Cor: Regular rate & rhythm. No rubs, gallops or murmurs. Lungs: Diminished in bases Abdomen: Soft, obese, nontender, nondistended.  Extremities: No cyanosis, clubbing, rash, edema Neuro: Alert & oriented x 3, moves all 4 extremities w/o difficulty. Affect pleasant.  ASSESSMENT & PLAN: 1. Chronic diastolic CHF with significant RV dysfunction: Echo in 2/21 with LVEF 55-60%, moderate RV dilation/moderate RV dysfunction.  RHC 2/18 showed elevated left and right heart filling pressures with severe pulmonary hypertension.  Suspect mixed pulmonary venous/pulmonary arterial hypertension in setting of significant LV diastolic dysfunction (PVR 3.87 WU).  Repeat RHC in 9/21 with moderate-severe PAH, normal filling pressures, and PVR 5.6 WU.  No evidence for left=>right shunting.  Echo in 5/22 showed EF 55% with mildly enlarged RV with mild RV dysfunction, mildly D-shaped septum, PASP 48 with normal IVC.  Echo today 03/18/24, preserved EF, continues with RV dysfunction and elevated PA pressures. NYHA class II-III.  She is not volume overloaded on exam today.  - Continue torsemide  40 mg bid. BMET/BNP today.  - Continue spiro 12.5 mg daily. - Continue Farxiga  10 mg daily.  2. Pulmonary hypertension: Suspect that this is a mixed picture with group 2 and group 3 PH (related to diastolic LV dysfunction and OHS/OSA).  However, concerned for component of group 1 PAH as well.  V/Q scan not suggestive of chronic PEs and high resolution CT chest in 2/21 was not suggestive of ILD. HIV and rheumatologic serologies negative.  RHC showed severe mixed pulmonary venous/pulmonary arterial hypertension, repeat RHC after diuresis in 9/21  showed PAH. PFTs completed in 2/21 showed severe restriction, severely decreased DLCO (?restriction due to body habitus).  Echo in 5/22 showed mild improvement in RV and improvement in estimated PA systolic pressure.  V/Q scan in 2/25 again showed no evidence for chronic PE, and RHC in 2/25 showed normal RA pressure, severe pulmonary arterial hypertension with PVR 11 WU, and PCWP borderline at 15 with low CI.  She is on Opsumit , tadalafil  and Uptravi  800 mcg bid.  6 minute walk today mildly worse compared to 2/25.  - Continue to maintain oxygen saturation with home O2 and CPAP. Weight loss also would be helpful.  She has had a breast reduction surgery which may decrease lung restriction.  - Continue Opsumit  10 mg daily. - Continue tadalafil  40 mg daily.  - She is tolerating Uptravi  800 mcg bid now, she has not tolerated uptitration.  - She is now on sotatercept given ongoing significantly elevated PA pressure; has received initial dose 2/25, no dose titration given hgb > 15. Planning 2nd dose soon. 3. HTN: Controlled.  - Continue current meds. 4. CKD stage 3. Continue SGLT2i. BMET today.  - Followup with nephrology.  5. OHS/OSA: Now s/p breast reduction surgery which hopefully will help with OHS.   - Compliant w/ CPAP and home oxygen, no change. 6. 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  As she is at baseline sedentary and has had recurrent DVTs, would continue Eliquis  long-term.   7. Restrictive lung disease: Now s/p breast reduction  surgery.  8. Obesity: There is no height or weight on file to calculate BMI. - Continue to work on weight loss.   - Refer to PharmD for  GLP-1 agonist. - Refer to PREP program at [facility], per her request. 9. Knee pain: Has seen Dr. Rozelle Corning, considering R total knee replacement. She would be at least moderate, if not high CV risk for surgery, given co-morbidities, including to severe Pulm Htn. - If surgery is offered, favor having it at [facility] OR.  Follow up  in 2 months with Dr. Mitzie Anda.  Signed, Elmarie Hacking, FNP  03/27/2024  [facility] [address] Heart and Vascular [place] [address] [phone] (office) [phone] (fax)

## 2024-03-28 ENCOUNTER — Ambulatory Visit (HOSPITAL_COMMUNITY)
Admission: RE | Admit: 2024-03-28 | Discharge: 2024-03-28 | Disposition: A | Discharge status: Home / Self Care | Source: Ambulatory Visit | Attending: Family Medicine | Admitting: Family Medicine

## 2024-03-28 ENCOUNTER — Encounter (HOSPITAL_COMMUNITY): Payer: Self-pay

## 2024-03-28 VITALS — BP 142/108 | HR 61 | Wt 220.0 lb

## 2024-03-28 DIAGNOSIS — Z79899 Other long term (current) drug therapy: Secondary | ICD-10-CM | POA: Insufficient documentation

## 2024-03-28 DIAGNOSIS — Z6841 Body Mass Index (BMI) 40.0 and over, adult: Secondary | ICD-10-CM | POA: Diagnosis not present

## 2024-03-28 DIAGNOSIS — N183 Chronic kidney disease, stage 3 unspecified: Secondary | ICD-10-CM | POA: Insufficient documentation

## 2024-03-28 DIAGNOSIS — I10 Essential (primary) hypertension: Secondary | ICD-10-CM

## 2024-03-28 DIAGNOSIS — G4733 Obstructive sleep apnea (adult) (pediatric): Secondary | ICD-10-CM | POA: Diagnosis not present

## 2024-03-28 DIAGNOSIS — I5032 Chronic diastolic (congestive) heart failure: Secondary | ICD-10-CM | POA: Diagnosis present

## 2024-03-28 DIAGNOSIS — I272 Pulmonary hypertension, unspecified: Secondary | ICD-10-CM | POA: Diagnosis not present

## 2024-03-28 DIAGNOSIS — I130 Hypertensive heart and chronic kidney disease with heart failure and stage 1 through stage 4 chronic kidney disease, or unspecified chronic kidney disease: Secondary | ICD-10-CM | POA: Insufficient documentation

## 2024-03-28 DIAGNOSIS — R519 Headache, unspecified: Secondary | ICD-10-CM

## 2024-03-28 DIAGNOSIS — Z86718 Personal history of other venous thrombosis and embolism: Secondary | ICD-10-CM | POA: Diagnosis not present

## 2024-03-28 DIAGNOSIS — I2721 Secondary pulmonary arterial hypertension: Secondary | ICD-10-CM | POA: Insufficient documentation

## 2024-03-28 DIAGNOSIS — G8929 Other chronic pain: Secondary | ICD-10-CM

## 2024-03-28 DIAGNOSIS — M25561 Pain in right knee: Secondary | ICD-10-CM

## 2024-03-28 MED ORDER — LOSARTAN POTASSIUM 25 MG PO TABS: 12.5000 mg | ORAL_TABLET | Freq: Every day | ORAL | 3 refills | Status: DC

## 2024-03-28 NOTE — Progress Notes (Signed)
 ReDS Vest / Clip - 03/28/24 0900       ReDS Vest / Clip   Station Marker B    Ruler Value 35    ReDS Value Range Low volume    ReDS Actual Value 29

## 2024-03-28 NOTE — Patient Instructions (Signed)
 Medication Changes:  START: LOSARTAN 12.5MG  ONCE DAILY   Lab Work:  PLEASE RETURN FOR LABS AS SCHEDULED AROUND 6/16  Special Instructions // Education:  CHECK YOUR BLOOD PRESSURE DAILY AND WRITE THIS NUMBER DOWN   PLEASE NOTIFY OUR CLINIC IF YOUR BLOOD PRESSURE IS NOT IMPROVING OVER THE NEXT 2 WEEKS  Follow-Up in: KEEP FOLLOW UP AS SCHEDULED WITH DR. Mitzie Anda   At the [facility], you and your health needs are our priority. We have a designated team specialized in the treatment of Heart Failure. This Care Team includes your primary Heart Failure Specialized Cardiologist (physician), Advanced Practice Providers (APPs- Physician Assistants and Nurse Practitioners), and Pharmacist who all work together to provide you with the care you need, when you need it.   You may see any of the following providers on your designated Care Team at your next follow up:  Dr. Jules Oar Dr. Peder Bourdon Dr. Alwin Baars Dr. Judyth Nunnery Nieves Bars, NP Ruddy Corral, [place] [facility] [place], [place] Dennise Fitz, NP [place] Lee, NP Luster Salters, PharmD   Please be sure to bring in all your medications bottles to every appointment.   Need to Contact Us :  If you have any questions or concerns before your next appointment please send us  a message through [place] or call our office at [phone].    TO LEAVE A MESSAGE FOR THE NURSE SELECT OPTION 2, PLEASE LEAVE A MESSAGE INCLUDING: YOUR NAME DATE OF BIRTH CALL BACK NUMBER REASON FOR CALL**this is important as we prioritize the call backs  YOU WILL RECEIVE A CALL BACK THE SAME DAY AS LONG AS YOU CALL BEFORE 4:00 PM

## 2024-04-01 ENCOUNTER — Inpatient Hospital Stay (HOSPITAL_COMMUNITY): Admission: RE | Admit: 2024-04-01 | Source: Ambulatory Visit

## 2024-04-14 ENCOUNTER — Telehealth (HOSPITAL_COMMUNITY): Payer: Self-pay

## 2024-04-14 ENCOUNTER — Other Ambulatory Visit (HOSPITAL_COMMUNITY)

## 2024-04-14 ENCOUNTER — Ambulatory Visit (HOSPITAL_COMMUNITY): Payer: Self-pay | Admitting: Cardiology

## 2024-04-14 ENCOUNTER — Ambulatory Visit (HOSPITAL_COMMUNITY)
Admission: RE | Admit: 2024-04-14 | Discharge: 2024-04-14 | Disposition: A | Discharge status: Home / Self Care | Source: Ambulatory Visit | Attending: Cardiology | Admitting: Cardiology

## 2024-04-14 DIAGNOSIS — I5032 Chronic diastolic (congestive) heart failure: Secondary | ICD-10-CM | POA: Insufficient documentation

## 2024-04-14 DIAGNOSIS — I272 Pulmonary hypertension, unspecified: Secondary | ICD-10-CM | POA: Insufficient documentation

## 2024-04-14 LAB — CBC
HCT: 50.8 % — ABNORMAL HIGH (ref 36.0–46.0)
Hemoglobin: 16.2 g/dL — ABNORMAL HIGH (ref 12.0–15.0)
MCH: 30.1 pg (ref 26.0–34.0)
MCHC: 31.9 g/dL (ref 30.0–36.0)
MCV: 94.2 fL (ref 80.0–100.0)
Platelets: 260 10*3/uL (ref 150–400)
RBC: 5.39 MIL/uL — ABNORMAL HIGH (ref 3.87–5.11)
RDW: 15.2 % (ref 11.5–15.5)
WBC: 4.1 10*3/uL (ref 4.0–10.5)
nRBC: 0 % (ref 0.0–0.2)

## 2024-04-14 LAB — BASIC METABOLIC PANEL WITH GFR
Anion gap: 13 (ref 5–15)
BUN: 34 mg/dL — ABNORMAL HIGH (ref 6–20)
CO2: 19 mmol/L — ABNORMAL LOW (ref 22–32)
Calcium: 8.8 mg/dL — ABNORMAL LOW (ref 8.9–10.3)
Chloride: 109 mmol/L (ref 98–111)
Creatinine, Ser: 1.84 mg/dL — ABNORMAL HIGH (ref 0.44–1.00)
GFR, Estimated: 32 mL/min — ABNORMAL LOW (ref >60)
Glucose, Bld: 85 mg/dL (ref 70–99)
Potassium: 4 mmol/L (ref 3.5–5.1)
Sodium: 141 mmol/L (ref 135–145)

## 2024-04-14 NOTE — Telephone Encounter (Signed)
 Called patient to discuss Bmet and CBC results with Ms. Scarfo. Noted Hgb was stable at 16.2 g/dL. She reported having a headache with N/V after taking her last dose of Winrevair  a few weeks ago. She reported no side effects with her prior Winrevair  doses. Risks and benefits of continuing discussed with patient, she stated that she is willing to repeat her Winrevair  injection at the lower dose (0.3 mg/kg). Patient stated she would call [facility] to get this medication refilled and plans to take her next injection on Sunday, 04/20/24.   Patient has a follow-up appointment with Dr. Mitzie Anda on 05/13/24, will need CBC re-checked during this appointment. Patient verbalized understanding of the plan discussed today.  Volney Grumbles, PharmD PGY-1 Acute Care Pharmacy Resident 04/14/2024 3:50 PM

## 2024-04-19 ENCOUNTER — Encounter (HOSPITAL_COMMUNITY): Payer: Self-pay | Admitting: Cardiology

## 2024-04-25 ENCOUNTER — Encounter: Payer: Self-pay | Admitting: Orthopedic Surgery

## 2024-04-28 NOTE — Telephone Encounter (Signed)
 Winrevair  is known to cause severe headaches (~25%) as well as increased bleeding events relative to placebo. Elevated blood pressure has also been reported, although frequency is not defined in the package insert.

## 2024-04-28 NOTE — Telephone Encounter (Signed)
 Unfortunately, she is already on the lowest dose (0.3 mg/kg) of the Winrevair .

## 2024-04-29 NOTE — Telephone Encounter (Addendum)
 Patient is currently scheduled for follow up on 7/15   Do you need to see her before this?

## 2024-05-02 ENCOUNTER — Encounter (HOSPITAL_COMMUNITY): Payer: Self-pay | Admitting: Cardiology

## 2024-05-12 ENCOUNTER — Telehealth: Payer: Self-pay | Admitting: Pharmacy Technician

## 2024-05-12 ENCOUNTER — Telehealth: Payer: Self-pay | Admitting: Pharmacist

## 2024-05-12 ENCOUNTER — Encounter: Payer: Self-pay | Admitting: Pharmacist

## 2024-05-12 ENCOUNTER — Ambulatory Visit: Attending: Cardiovascular Disease | Admitting: Pharmacist

## 2024-05-12 ENCOUNTER — Other Ambulatory Visit (HOSPITAL_COMMUNITY): Payer: Self-pay

## 2024-05-12 VITALS — Ht 60.0 in | Wt 222.6 lb

## 2024-05-12 DIAGNOSIS — G4733 Obstructive sleep apnea (adult) (pediatric): Secondary | ICD-10-CM | POA: Diagnosis not present

## 2024-05-12 NOTE — Telephone Encounter (Signed)
 Pharmacy Patient Advocate Encounter   Received notification from Pt Calls Messages [facility]) that prior authorization for Zepbound  2.5MG  is required/requested.   Insurance verification completed.   The patient is insured through [employer] .   Per test claim: PA required; PA submitted to above mentioned insurance via CoverMyMeds Key/confirmation #/EOC [id number] Status is pending

## 2024-05-12 NOTE — Telephone Encounter (Signed)
 Pharmacy Patient Advocate Encounter   Received notification from Pt Calls Messages that prior authorization for Wegovy 0.25MG  is required/requested.   Insurance verification completed.   The patient is insured through [employer] .   Per test claim: PA required; PA submitted to above mentioned insurance via CoverMyMeds Key/confirmation #/EOC [id number] Status is pending

## 2024-05-12 NOTE — Telephone Encounter (Signed)
 Pharmacy Patient Advocate Encounter  Received notification from [facility] that Prior Authorization for Zepbound  has been APPROVED from 05/12/24 to 10/29/24. Ran test claim, Copay is $0.00- one month. This test claim was processed through [facility]- copay amounts may vary at other pharmacies due to pharmacy/plan contracts, or as the patient moves through the different stages of their insurance plan.   PA #/Case ID/Reference #: [id number]

## 2024-05-12 NOTE — Progress Notes (Signed)
 Patient ID: Gladys Deckard                 DOB: 12/11/1965                    MRN: [id number]     HPI: Dawn Fowler is a 58 y.o. female patient referred to pharmacy clinic by Harlene Gainer, NP  to initiate GLP1-RA therapy. PMH is significant for hypertension, CHF,pulmonary hypertension, CKD stage IV, Restrictive lung disease, hx of gout, hx of DVT, depression and obesity. Most recent BMI 42.35 kg/m .  Baseline weight and BMI: 245 lbs 47.04 kg/m  Current weight and BMI: 222.6 lbs  43.47 kg/m  Current meds that affect weight: none Goal weight 180 lbs   The patient previously weighed 319 lbs and, through dietary changes, successfully reduced her weight to 222.6 lbs. However, she reports that recently she has been making less healthy food choices. She notes that drinking fluids with meals makes her feel full initially, but she becomes hungry again shortly afterward and ends up snacking on unhealthy foods. She plans to begin water aerobics at a local gym but is unsure if she will be permitted to enter the pool with her oxygen tank. She intends to speak with the gym director for clarification.  Diet:  Cut sown on sugary beverage.  Breakfast: skip or have  granola bar   Lunch: varies - salads or sandwich - dressing  Dinner: spaghetti squash, vegetables  Snacks: chips  Drink: pepsi once in while tea-lemonade mixture  2 times per week eats out  Exercise:  Will be stating water aerobics   Family History:   Social History:  Alcohol: none  Smoking: none   Labs: Lab Results  Component Value Date   HGBA1C 5.3 03/18/2024    Wt Readings from Last 1 Encounters:  03/28/24 220 lb (99.8 kg)    BP Readings from Last 1 Encounters:  03/28/24 (!) 142/108   Pulse Readings from Last 1 Encounters:  03/28/24 61       Component Value Date/Time   TRIG 137 10/29/2019 1843    Past Medical History:  Diagnosis Date   Acute respiratory failure due to COVID-19 (HCC) 10/30/2019   Anxiety     Arthritis    CHF (congestive heart failure) (HCC)    CKD (chronic kidney disease), stage III (HCC)    Complication of anesthesia    hard to wake up after general anesthesia - had trouble waking up after succinylcholine (consider possibility of pseudocholinesterase deficiency)   COVID-19 virus detected 11/20/2019   Tested positive for Covid in December/2020 Required hospitalization in December/2020 through January/2021 Received Redemsivir   Depression    DVT (deep venous thrombosis) (HCC) 06/09/2020   RLE   Dyspnea    Family history of adverse reaction to anesthesia    hard to wake up after general anesthesia   GERD (gastroesophageal reflux disease)    HLD (hyperlipidemia)    HTN (hypertension)    Pre-diabetes    no meds   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     albuterol  (VENTOLIN  HFA) 108 (90 Base) MCG/ACT inhaler Inhale 2 puffs into the lungs every 6 (six) hours as needed for wheezing or shortness of breath. 8 g 5   allopurinol  (ZYLOPRIM ) 100 MG tablet TAKE 1 TABLET(100 MG) BY MOUTH DAILY 30 tablet 2   apixaban  (ELIQUIS ) 5 MG TABS tablet Take 1 tablet (5 mg total) by mouth 2 (two) times daily. 60 tablet 9   BREO ELLIPTA  100-25 MCG/ACT AEPB INHALE 1 PUFF INTO THE LUNGS DAILY (Patient not taking: Reported on 03/28/2024) 60 each 0   Calcium Carbonate Antacid (TUMS E-X PO) Take 1-2 tablets by mouth 3 (three) times daily as needed (gas/abdominal pain.).     Cholecalciferol  (VITAMIN D3) 125 MCG (5000 UT) CAPS Take 5,000 Units by mouth in the morning.      colchicine 0.6 MG tablet Take 0.6 mg by mouth daily as needed (gout flare).     diclofenac Sodium (VOLTAREN) 1 % GEL Apply 2 g topically 3 (three) times daily as needed (pain.).     diphenhydramine -acetaminophen  (TYLENOL  PM) 25-500 MG TABS tablet Take 2 tablets by mouth at bedtime as needed (sleep).     empagliflozin (JARDIANCE) 10 MG TABS tablet Take 10 mg by mouth daily.     gabapentin  (NEURONTIN ) 100 MG capsule Take 100 mg by mouth as needed.     ipratropium-albuterol  (DUONEB) 0.5-2.5 (3) MG/3ML SOLN INHALE THE CONTENTS OF 1 VIAL VIA NEBULIZER EVERY 6 HOURS AS NEEDED FOR WHEEZING OR SHORTNESS OF BREATH 1080 mL 2   losartan  (COZAAR ) 25 MG tablet Take 0.5 tablets (12.5 mg total) by mouth daily. 15 tablet 3   macitentan  (OPSUMIT ) 10 MG tablet Take 1 tablet (10 mg total) by mouth daily. 90 tablet 3   OXYGEN Inhale 4-8 L into the lungs continuous.     potassium chloride  SA (KLOR-CON  M) 20 MEQ tablet Take 1 tablet (20 mEq total) by mouth daily. 90 tablet 3   Selexipag  (UPTRAVI ) 800 MCG TABS Take 1 tablet (800 mcg total) by mouth 2 (two) times daily. 60 tablet 0   sertraline  (ZOLOFT ) 50 MG tablet Take 1 tablet (50 mg total) by mouth 2 (two) times daily. 60 tablet 0   sotatercept (WINREVAIR ) subcutaneous injection Inject 0.3 mg/kg into the skin every 21 ( twenty-one) days. Dose = 0.6 mL; dosing weight = 101.6 kg     spironolactone  (ALDACTONE ) 25 MG tablet TAKE 1/2 TABLET(12.5 MG) BY MOUTH DAILY 45 tablet 3   tadalafil , PAH, (ADCIRCA ) 20 MG tablet Patient takes 2 tablets by mouth daily. 60 tablet 1   torsemide  (DEMADEX ) 20 MG tablet TAKE 2 TABLETS(40 MG) BY MOUTH TWICE DAILY 120 tablet 0   vitamin B-12 (CYANOCOBALAMIN) 50 MCG tablet Take 50 mcg by mouth daily.     No current facility-administered medications on file prior to visit.    Allergies  Allergen Reactions   Adhesive [Tape] Hives    Tolerates paper tape   Succinylcholine Other (See Comments)    Have trouble waking up   Tizanidine Hives      Assessment/Plan:  1. Weight loss - Patient has not met goal of at least 5% of body weight loss with comprehensive lifestyle modifications alone in the past 3-6 months. Pharmacotherapy is appropriate to pursue as augmentation. Will start wegovy and Zepbound  coverage assessment  . Confirmed patient has no personal or family history of medullary thyroid carcinoma (MTC) or Multiple Endocrine Neoplasia syndrome type 2 (MEN 2). Injection  technique reviewed at today's visit.  Advised patient on common side effects including nausea, diarrhea, dyspepsia, decreased appetite, and fatigue. Counseled patient on reducing meal size and how to titrate medication to minimize side effects. Counseled patient to call if intolerable side effects or if experiencing dehydration, abdominal pain, or dizziness. Along with pharmacotherapy, the patient will follow dietary modifications and aim for at least 150 minutes of moderate-intensity exercise per week, plus resistance training twice a week (as recommended by the American Heart Association). This resistance training--such as weightlifting, bodyweight exercises, or using resistance bands, adapted to the patient's ability--will help prevent muscle loss.  Follow up in 1-2 days regarding coverage of GLP1 . If therapy is initiated, phone follow-ups will be conducted every 4 weeks for dose titration until the patient reaches the effective therapeutic dose and target weight.  Robbi Blanch, Pharm.D [facility]. [facility] [address], [place], [place] [address] Phone: [phone]; Fax: [phone]

## 2024-05-12 NOTE — Patient Instructions (Signed)
 GLP1 Agonist Titration Plan:  Will plan to follow the titration plan as below, pending patient is tolerating each dose before increasing to the next. Can slow titration if needed for tolerability.   Zepbound    Treatment week Recommended Zepbound  (tirzepatide ) dosage for weight loss and sleep apnea  Weeks 1-4  2.5 mg once a week  Weeks 5-8    5 mg once a week   Week 9 and beyond For weight loss: Can raise dosage again, if needed, by no more than 2.5 mg at once. Dosage increases should be separated by at least 4 weeks.  For sleep apnea: Raise dosage to 7.5 mg once a week. At week 13, raise it again to 10 mg once a week. Starting at week 17, your dose can be raised again, if needed, by no more than 2.5 mg at once. Dosage increases should be separated by at least 4 weeks.   Maximum dosage 15 mg once a week  Recommended maintenance dosage for weight loss  5 mg, 10 mg, or 15 mg once a week  Recommended maintenance dosage for sleep apnea 10 mg or 15 mg once a week

## 2024-05-12 NOTE — Telephone Encounter (Signed)
 Pharmacy Patient Advocate Encounter  Received notification from [facility] that Prior Authorization for wegovy  has been DENIED.  Full denial letter will be uploaded to the media tab. See denial reason below.   PA #/Case ID/Reference #: [id number]

## 2024-05-13 ENCOUNTER — Encounter (HOSPITAL_COMMUNITY): Payer: Self-pay | Admitting: Cardiology

## 2024-05-13 ENCOUNTER — Telehealth (HOSPITAL_COMMUNITY): Payer: Self-pay | Admitting: Pharmacist

## 2024-05-13 ENCOUNTER — Ambulatory Visit (HOSPITAL_COMMUNITY)
Admission: RE | Admit: 2024-05-13 | Discharge: 2024-05-13 | Disposition: A | Discharge status: Home / Self Care | Source: Ambulatory Visit | Attending: Cardiology | Admitting: Cardiology

## 2024-05-13 ENCOUNTER — Other Ambulatory Visit (HOSPITAL_COMMUNITY): Payer: Self-pay

## 2024-05-13 ENCOUNTER — Ambulatory Visit (HOSPITAL_COMMUNITY): Payer: Self-pay | Admitting: Cardiology

## 2024-05-13 VITALS — BP 180/110 | HR 85 | Ht 60.0 in | Wt 223.2 lb

## 2024-05-13 DIAGNOSIS — N183 Chronic kidney disease, stage 3 unspecified: Secondary | ICD-10-CM | POA: Insufficient documentation

## 2024-05-13 DIAGNOSIS — Z79899 Other long term (current) drug therapy: Secondary | ICD-10-CM | POA: Insufficient documentation

## 2024-05-13 DIAGNOSIS — G4733 Obstructive sleep apnea (adult) (pediatric): Secondary | ICD-10-CM | POA: Diagnosis not present

## 2024-05-13 DIAGNOSIS — I272 Pulmonary hypertension, unspecified: Secondary | ICD-10-CM | POA: Diagnosis not present

## 2024-05-13 DIAGNOSIS — R0602 Shortness of breath: Secondary | ICD-10-CM | POA: Diagnosis present

## 2024-05-13 DIAGNOSIS — Z6841 Body Mass Index (BMI) 40.0 and over, adult: Secondary | ICD-10-CM | POA: Diagnosis not present

## 2024-05-13 DIAGNOSIS — J984 Other disorders of lung: Secondary | ICD-10-CM | POA: Diagnosis not present

## 2024-05-13 DIAGNOSIS — E669 Obesity, unspecified: Secondary | ICD-10-CM | POA: Insufficient documentation

## 2024-05-13 DIAGNOSIS — Z7901 Long term (current) use of anticoagulants: Secondary | ICD-10-CM | POA: Insufficient documentation

## 2024-05-13 DIAGNOSIS — I5032 Chronic diastolic (congestive) heart failure: Secondary | ICD-10-CM | POA: Diagnosis not present

## 2024-05-13 DIAGNOSIS — Z86718 Personal history of other venous thrombosis and embolism: Secondary | ICD-10-CM | POA: Diagnosis not present

## 2024-05-13 DIAGNOSIS — Z5986 Financial insecurity: Secondary | ICD-10-CM | POA: Insufficient documentation

## 2024-05-13 DIAGNOSIS — I130 Hypertensive heart and chronic kidney disease with heart failure and stage 1 through stage 4 chronic kidney disease, or unspecified chronic kidney disease: Secondary | ICD-10-CM | POA: Insufficient documentation

## 2024-05-13 DIAGNOSIS — Z9981 Dependence on supplemental oxygen: Secondary | ICD-10-CM | POA: Diagnosis not present

## 2024-05-13 LAB — BASIC METABOLIC PANEL WITH GFR
Anion gap: 12 (ref 5–15)
BUN: 32 mg/dL — ABNORMAL HIGH (ref 6–20)
CO2: 22 mmol/L (ref 22–32)
Calcium: 9.5 mg/dL (ref 8.9–10.3)
Chloride: 105 mmol/L (ref 98–111)
Creatinine, Ser: 1.94 mg/dL — ABNORMAL HIGH (ref 0.44–1.00)
GFR, Estimated: 30 mL/min — ABNORMAL LOW (ref >60)
Glucose, Bld: 83 mg/dL (ref 70–99)
Potassium: 4.3 mmol/L (ref 3.5–5.1)
Sodium: 139 mmol/L (ref 135–145)

## 2024-05-13 LAB — CBC
HCT: 51.3 % — ABNORMAL HIGH (ref 36.0–46.0)
Hemoglobin: 16.4 g/dL — ABNORMAL HIGH (ref 12.0–15.0)
MCH: 29.8 pg (ref 26.0–34.0)
MCHC: 32 g/dL (ref 30.0–36.0)
MCV: 93.1 fL (ref 80.0–100.0)
Platelets: 272 K/uL (ref 150–400)
RBC: 5.51 MIL/uL — ABNORMAL HIGH (ref 3.87–5.11)
RDW: 15.6 % — ABNORMAL HIGH (ref 11.5–15.5)
WBC: 4.2 K/uL (ref 4.0–10.5)
nRBC: 0 % (ref 0.0–0.2)

## 2024-05-13 LAB — BRAIN NATRIURETIC PEPTIDE: B Natriuretic Peptide: 98 pg/mL (ref 0.0–100.0)

## 2024-05-13 MED ORDER — FLUTICASONE FUROATE-VILANTEROL 100-25 MCG/ACT IN AEPB
1.0000 | INHALATION_SPRAY | Freq: Every day | RESPIRATORY_TRACT | 0 refills | Status: DC
Start: 2024-05-13 — End: 2024-07-15

## 2024-05-13 MED ORDER — ADEMPAS 0.5 MG PO TABS: 0.5000 mg | ORAL_TABLET | Freq: Three times a day (TID) | ORAL | Status: DC

## 2024-05-13 MED ORDER — AMLODIPINE BESYLATE 5 MG PO TABS: 5.0000 mg | ORAL_TABLET | Freq: Every day | ORAL | 3 refills | Status: AC

## 2024-05-13 MED ORDER — LOSARTAN POTASSIUM 25 MG PO TABS: 25.0000 mg | ORAL_TABLET | Freq: Every day | ORAL | 3 refills | Status: DC

## 2024-05-13 NOTE — Patient Instructions (Addendum)
 START Amlodipine  5 mg daily.  INCREASE Losartan  to 25 mg daily.  You will be contact when to stop the Tadalafil  and start the Adempas .  Labs done today, your results will be available in MyChart, we will contact you for abnormal readings.  Repeat blood work in 10 days.  Please follow up with our heart failure pharmacist as scheduled.  Your physician recommends that you schedule a follow-up appointment in: 2 months.  If you have any questions or concerns before your next appointment please send us  a message through [place] or call our office at [phone].    TO LEAVE A MESSAGE FOR THE NURSE SELECT OPTION 2, PLEASE LEAVE A MESSAGE INCLUDING: YOUR NAME DATE OF BIRTH CALL BACK NUMBER REASON FOR CALL**this is important as we prioritize the call backs  YOU WILL RECEIVE A CALL BACK THE SAME DAY AS LONG AS YOU CALL BEFORE 4:00 PM  At the [facility], you and your health needs are our priority. As part of our continuing mission to provide you with exceptional heart care, we have created designated Provider Care Teams. These Care Teams include your primary Cardiologist (physician) and Advanced Practice Providers (APPs- Physician Assistants and Nurse Practitioners) who all work together to provide you with the care you need, when you need it.   You may see any of the following providers on your designated Care Team at your next follow up: Dr Toribio Fuel Dr Ezra Shuck Dr. Ria Commander Dr. Morene Brownie Amy Lenetta, NP Caffie Shed, [place] [facility] [place], [place] Beckey Coe, NP [place] Lee, NP Ellouise Class, NP Tinnie Redman, PharmD Jaun Bash, PharmD   Please be sure to bring in all your medications bottles to every appointment.    Thank you for choosing [facility]

## 2024-05-13 NOTE — Progress Notes (Signed)
 ID:  Dawn Fowler, DOB 1965/11/16, MRN [id number]   Provider location: [facility] Type of Visit: Established patient   PCP:  Burney Darice CROME, MD  Cardiologist:  Dr. Rolan  Chief complaint: Shortness of breath   History of Present Illness: Dawn Fowler is a 58 y.o. 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  She is on CPAP at home and 6L home oxygen (says she has been on oxygen for years.).  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  She additionally has significant HTN.  She had been on Opsumit  10 + tadalafil  20 mg daily but ran out of this prior to her 2/21 admission. She also has a history of prior DVT after long train ride, has had 2 negative V/Q scans.  She was hospitalized for COVID-19 at [facility] in 1/21.    She was seen by [facility] after discharge from COVID admission and was noted to be volume overloaded, Lasix  was changed to torsemide  but she was unable to get to pharmacy to pick up. Subsequently, she developed progressive wt gain, nearly 20 lb and increased dyspnea, prompting her to report back to the ED in 2/21, where she was found to be in acute on chronic diastolic HF and readmitted for IV diuresis and AHF consultation. Echo in 2/21 showed LVEF 55-60%, moderate RV dilation/moderate RV dysfunction. RHC 2/21 showed elevated left and right heart filling pressures with severe pulmonary hypertension. PFTs completed showing severe restriction and severely decreased DLCO. High resolution CT was concerning  for small vessel disease, no ILD, and concerning for PAH.  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 She was continued on IV diuretics and had good response,  diuresing 32 lb. She was transitioned to torsemide  40 mg/20 mg. Opsumit  and tadalafil  restarted.   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  She is on Eliquis . RHC in 9/21 showed normal RA pressure and PCWP, moderate-severe pulmonary arterial hypertension.   Echo 5/22 showed EF 55% with mildly enlarged RV with mild RV dysfunction, mildly D-shaped septum, PASP 48 with normal IVC.   In 2/23, she had bilateral breast reduction surgery. She developed significant volume overload and her torsemide  was increased.  She moved to [place]  and had RHC there in 2/25; normal PCWP and RA pressure but PA 87/32 mean 54 with PVR 11 WU and PCWP 15, CI 1.8 (thermo). V/Q scan in 2/25 showed no sign of chronic PEs.   Follow up 2/25, recently moved back to [place] from [place] . Sotatercept initiated given ongoing significantly elevated PA pressures.   Echo 5/25 showed EF 55-60%, D-shaped septum with moderate RV dysfunction, PASP 49 mmHg.   Patient returns for followup of CHF.  She stopped sotatercept due to severe headache after getting injections.  She does not want to retry it.  BP has been very high recently, 180/110 today.  Weight up 3 lbs.  She continues on home oxygen 4L [place] and CPAP at night. She is following a generally high sodium diet. Poor stamina.  She denies dyspnea walking on flat ground with her walker.  No lightheadedness or palpitations.  No chest pain.  ECG (personally reviewed): NSR, PVC, poor RWP  Labs (2/23): K 3.8, creatinine 2.01 Labs (3/23): K 4.1, creatinine 2.19 Labs (5/23): K 4.0, creatinine 1.9 Labs (1/25): K 3.8,c reatinine 1.86, hgb 15.6  Labs (7/23): K 4.4, creatinine 1.87 Labs (5/25): K 4.3, creatinine 2.04 Labs (6/25): K 4, creatinine 1.84  6 minute walk (9/21): 168 m 6 minute walk (10/21): 122 m 6 minute walk (3/22): 107 m 6 minute walk (2/25): 229 m 6 minute walk (5/25): 152 m, oxygen sats 90%-86% on 4 L 6 minute walk (7/25): 243 m  PMH: 1.  OHS/OSA: Uses home oxygen.  2. CKD stage IV 3. COVID-19 PNA in 12/20 4. Restrictive lung disease: PFTs (2/21) with severe restriction, severely decreased DLCO.  - High resolution CT chest (2/21): no interstitial lung disease, small vessel disease.  - Breast reduction surgery 2/23 5. Gout 6. HTN 7. DVT: Remote, after train ride.  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  8. Pulmonary hypertension: RHC (2/21) with mean RA 14, PA 89/33 mean 54, mean PCWP 34, CI 2.22, PVR 3.87 WU => mixed pulmonary arterial/pulmonary venous hypertension.  - V/Q scan (2/21): No evidence for chronic PE.  - Echo (2/21): EF 55-60%, moderately dilated RV with moderately decreased systolic function.  - HIV and rheumatologic serologies negative.  - RHC (9/21): mean RA 9, PA 68/23 mean 40, mean PCWP 11, PVR 5.6 WU, no step up in oxygen saturation so no evidence for left to right shunting.  - Echo (5/22): EF 55% with mildly enlarged RV with mild RV dysfunction, mildly D-shaped septum, PASP 48 with normal IVC - RHC (2/25): mean RA 7, PA 87/32 mean 54, mean PCWP 15, CI thermo 1.8, PVR 11 WU. - Echo (5/25):  EF 55-60%, D-shaped septum with moderate RV dysfunction, PASP 49 mmHg.  9. Depression  Current Outpatient Medications  Medication Sig Dispense Refill   acetaminophen  (TYLENOL ) 500 MG tablet Take 1,000 mg by mouth every 6 (six) hours as needed for moderate pain or headache.     albuterol  (VENTOLIN  HFA) 108 (90 Base) MCG/ACT inhaler Inhale 2 puffs into the lungs every 6 (six) hours as needed for wheezing or shortness of breath. 8 g 5   allopurinol  (ZYLOPRIM ) 100 MG tablet TAKE 1 TABLET(100 MG) BY MOUTH DAILY 30 tablet 2   amLODipine  (NORVASC ) 5 MG tablet Take 1 tablet (5 mg total) by mouth daily. 90 tablet 3   apixaban  (ELIQUIS ) 5 MG TABS tablet Take 1 tablet (5 mg total) by mouth 2 (two) times daily. 60 tablet 9   Calcium Carbonate Antacid (TUMS E-X PO) Take 1-2 tablets by mouth 3 (three) times daily as  needed (gas/abdominal pain.).     Cholecalciferol  (VITAMIN D3) 125 MCG (5000 UT) CAPS Take 5,000 Units by mouth in the morning.     colchicine 0.6 MG tablet Take 0.6 mg by mouth daily as needed (gout flare).     diclofenac Sodium (VOLTAREN) 1 % GEL Apply 2 g topically 3 (three) times daily as needed (pain.).     diphenhydramine -acetaminophen  (TYLENOL  PM) 25-500 MG TABS tablet Take 2 tablets by mouth at bedtime as needed (sleep).     empagliflozin (JARDIANCE) 10 MG TABS tablet Take 10 mg by mouth daily.     gabapentin  (NEURONTIN ) 100 MG capsule Take 100 mg by mouth as needed.     ipratropium-albuterol  (DUONEB) 0.5-2.5 (3) MG/3ML SOLN INHALE THE CONTENTS OF 1 VIAL VIA NEBULIZER EVERY 6 HOURS AS NEEDED FOR WHEEZING  OR SHORTNESS OF BREATH 1080 mL 2   macitentan  (OPSUMIT ) 10 MG tablet Take 1 tablet (10 mg total) by mouth daily. 90 tablet 3   OXYGEN Inhale 4-8 L into the lungs continuous.     potassium chloride  SA (KLOR-CON  M) 20 MEQ tablet Take 1 tablet (20 mEq total) by mouth daily. 90 tablet 3   Riociguat  (ADEMPAS ) 0.5 MG TABS Take 0.5 mg by mouth in the morning, at noon, and at bedtime.     Selexipag  (UPTRAVI ) 800 MCG TABS Take 1 tablet (800 mcg total) by mouth 2 (two) times daily. 60 tablet 0   sertraline  (ZOLOFT ) 50 MG tablet Take 1 tablet (50 mg total) by mouth 2 (two) times daily. 60 tablet 0   spironolactone  (ALDACTONE ) 25 MG tablet TAKE 1/2 TABLET(12.5 MG) BY MOUTH DAILY 45 tablet 3   torsemide  (DEMADEX ) 20 MG tablet TAKE 2 TABLETS(40 MG) BY MOUTH TWICE DAILY 120 tablet 0   vitamin B-12 (CYANOCOBALAMIN) 50 MCG tablet Take 50 mcg by mouth daily.     fluticasone  furoate-vilanterol (BREO ELLIPTA ) 100-25 MCG/ACT AEPB Inhale 1 puff into the lungs daily. 60 each 0   losartan  (COZAAR ) 25 MG tablet Take 1 tablet (25 mg total) by mouth daily. 90 tablet 3   No current facility-administered medications for this encounter.   Allergies  Allergen Reactions   Adhesive [Tape] Hives    Tolerates paper  tape   Succinylcholine Other (See Comments)    Have trouble waking up   Tizanidine Hives    Social History   Socioeconomic History   Marital status: Single    Spouse name: Not on file   Number of children: Not on file   Years of education: Not on file   Highest education level: Not on file  Occupational History   Not on file  Tobacco Use   Smoking status: Never   Smokeless tobacco: Never  Vaping Use   Vaping status: Never Used  Substance and Sexual Activity   Alcohol use: Not Currently   Drug use: Not Currently   Sexual activity: Not Currently    Partners: Male    Birth control/protection: Post-menopausal    Comment: 1st intercourse- 79, partners- more than 5  Other Topics Concern   Not on file  Social History Narrative   Not on file   Social Drivers of Health   Financial Resource Strain: High Risk (07/12/2022)   Overall Financial Resource Strain (CARDIA)    Difficulty of Paying Living Expenses: Very hard  Food Insecurity: Food Insecurity Present (04/11/2022)   Hunger Vital Sign    Worried About Running Out of Food in the Last Year: Sometimes true    Ran Out of Food in the Last Year: Never true  Transportation Needs: No Transportation Needs (11/11/2020)   PRAPARE - Administrator, Civil Service (Medical): No    Lack of Transportation (Non-Medical): No  Physical Activity: Not on file  Stress: Not on file  Social Connections: Not on file  Intimate Partner Violence: Not At Risk (12/20/2023)   Received from [facility]   LH Intimate Violence    Are you afraid or have you ever been threatened by a current partner, family member, or household member?: No    Within the past year, have you ever been hit, slapped, choked, forced into sexual activity or otherwise hurt by a current partner or family member?: No   Family History  Problem Relation Age of Onset   COPD Father  Cancer Mother        gallbladder and bladder    Hypertension Brother    Breast  cancer Other    Diabetes Son    Breast cancer Cousin    Breast cancer Cousin    Pulmonary Hypertension Neg Hx    BP (!) 180/110   Pulse 85   Ht 5' (1.524 m)   Wt 101.2 kg (223 lb 3.2 oz)   LMP  (LMP Unknown) Comment: pmb last 2-3 months  SpO2 96% Comment: 4Ls of o2  BMI 43.59 kg/m   Wt Readings from Last 3 Encounters:  05/13/24 101.2 kg (223 lb 3.2 oz)  05/12/24 101 kg (222 lb 9.6 oz)  03/28/24 99.8 kg (220 lb)   PHYSICAL EXAM:  General: NAD Neck: JVP 7-8 cm, no thyromegaly or thyroid nodule.  Lungs: Clear to auscultation bilaterally with normal respiratory effort. CV: Nondisplaced PMI.  Heart regular S1/S2, no S3/S4, no murmur.  No peripheral edema.  No carotid bruit.  Normal pedal pulses.  Abdomen: Soft, nontender, no hepatosplenomegaly, no distention.  Skin: Intact without lesions or rashes.  Neurologic: Alert and oriented x 3.  Psych: Normal affect. Extremities: No clubbing or cyanosis.  HEENT: Normal.   ASSESSMENT & PLAN: 1. Chronic diastolic CHF with significant RV dysfunction: Echo in 2/21 with LVEF 55-60%, moderate RV dilation/moderate RV dysfunction.  RHC 2/18 showed elevated left and right heart filling pressures with severe pulmonary hypertension.  Suspect mixed pulmonary venous/pulmonary arterial hypertension in setting of significant LV diastolic dysfunction (PVR 3.87 WU).  Repeat RHC in 9/21 with moderate-severe PAH, normal filling pressures, and PVR 5.6 WU.  No evidence for left=>right shunting.  Echo in 5/22 showed EF 55% with mildly enlarged RV with mild RV dysfunction, mildly D-shaped septum, PASP 48 with normal IVC.  Echo in 5/25 showed EF 55-60%, D-shaped septum with moderate RV dysfunction, PASP 49 mmHg.  NYHA class II-III.  She is not volume overloaded on exam.  - Continue torsemide  40 mg bid. BMET/BNP today.  - Continue spiro 12.5 mg daily. - Continue Farxiga  10 mg daily.  2. Pulmonary hypertension: Suspect that this is a mixed picture with group 2 and  group 3 PH (related to diastolic LV dysfunction and OHS/OSA). However, concerned for component of group 1 PAH as well.  V/Q scan not suggestive of chronic PEs and high resolution CT chest in 2/21 was not suggestive of ILD. HIV and rheumatologic serologies negative.  RHC showed severe mixed pulmonary venous/pulmonary arterial hypertension, repeat RHC after diuresis in 9/21 showed PAH. PFTs completed in 2/21 showed severe restriction, severely decreased DLCO (?restriction due to body habitus).  Echo in 5/25 showed D-shaped septum with moderate RV dysfunction and PASP 49 mmHg.  V/Q scan in 2/25 again showed no evidence for chronic PE, and RHC in 2/25 showed normal RA pressure, severe pulmonary arterial hypertension with PVR 11 WU, and PCWP borderline at 15 with low CI.  She is on Opsumit , tadalafil  and Uptravi  800 mcg bid.  She did not tolerate sotatercept due to headache.  6 minute walk today was improved.  - Continue to maintain oxygen saturation with home O2 and CPAP. Weight loss also would be helpful.  She has had a breast reduction surgery which may decrease lung restriction.  - Continue Opsumit  10 mg daily. - I am going to try to transition her from tadalafil  to riociguat  with titration up to goal 2.5 mg tid.  - She is tolerating Uptravi  800 mcg bid now, she has  not tolerated uptitration.  3. HTN: BP poorly controlled.  - Increase losartan  to 25 mg daily, BMET today and again in 10 days.  - Add amlodipine  5 mg daily and can titrate up.  4. CKD stage 3.  - Continue SGLT2-i.  - Followup with nephrology.  5. OHS/OSA: Now s/p breast reduction surgery which hopefully will help with OHS. Compliant w/ CPAP and home oxygen, no change. 6. 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  As she is at baseline sedentary and has had recurrent DVTs, would continue Eliquis  long-term.   7. Restrictive lung disease: Now s/p breast reduction surgery.  8. Obesity: Body mass index is 43.59 kg/m. Continue to work on  weight loss.   - Referred to PharmD for  GLP-1 agonist, waiting to hear if she can get coverage.  Followup 2 months.   I spent 31 minutes reviewing records, interviewing/examining patient, and managing orders.   Signed, Ezra Shuck, MD  05/13/2024  [facility] [place] [address] [facility] [place] [address] [phone] (office) [phone] (fax)

## 2024-05-13 NOTE — Telephone Encounter (Signed)
 Patient Advocate Encounter   Received notification from [employer] that prior authorization for Adempas  is required.   PA submitted on CoverMyMeds Key [id number] Status is pending   Will continue to follow.   Tinnie Redman, PharmD, BCPS, BCCP, CPP [facility] Clinic Pharmacist [phone]

## 2024-05-13 NOTE — Progress Notes (Signed)
 6 Min Walk Test Completed  Pt ambulated 872ft (243.74m) O2 Sat ranged 96-85 on 4-5L oxygen HR ranged 119-102

## 2024-05-14 NOTE — Telephone Encounter (Signed)
 [facility] Patient Advocate Encounter  Prior Authorization for Adempas  has been approved.    PA# [id number] Effective dates: 05/13/24 through 10/29/24  Tinnie Redman, PharmD, BCPS, BCCP, CPP [facility] Failure Clinic Pharmacist [phone]

## 2024-05-15 ENCOUNTER — Telehealth (HOSPITAL_COMMUNITY): Payer: Self-pay

## 2024-05-15 MED ORDER — ZEPBOUND 2.5 MG/0.5ML ~~LOC~~ SOAJ: 2.5000 mg | SUBCUTANEOUS | 0 refills | Status: DC

## 2024-05-15 NOTE — Addendum Note (Signed)
 Addended by: Ahava Kissoon K on: 05/15/2024 09:31 AM   Modules accepted: Orders

## 2024-05-15 NOTE — Telephone Encounter (Signed)
 [facility] Patient Advocate Encounter  Adempas  Enrollment and [employer] forms faxed in on 05/15/2024.  Rachel DEL, CPhT Rx Patient Advocate Phone: [phone]

## 2024-05-19 ENCOUNTER — Ambulatory Visit: Admitting: Orthopedic Surgery

## 2024-05-19 NOTE — Telephone Encounter (Signed)
 Adempas  requested clarification of application. Updated forms faxed on 07/21

## 2024-05-20 ENCOUNTER — Encounter (HOSPITAL_COMMUNITY): Payer: Self-pay | Admitting: Cardiology

## 2024-05-21 ENCOUNTER — Other Ambulatory Visit: Payer: Self-pay | Admitting: Medical Genetics

## 2024-05-22 NOTE — Telephone Encounter (Signed)
 [employer]emp[employer]  called to request further clarification for application. Forms updated and faxed on 07/24

## 2024-05-23 ENCOUNTER — Ambulatory Visit (HOSPITAL_COMMUNITY)
Admission: RE | Admit: 2024-05-23 | Discharge: 2024-05-23 | Disposition: A | Discharge status: Home / Self Care | Source: Ambulatory Visit | Attending: Cardiology | Admitting: Cardiology

## 2024-05-23 DIAGNOSIS — I5032 Chronic diastolic (congestive) heart failure: Secondary | ICD-10-CM | POA: Insufficient documentation

## 2024-05-23 LAB — BASIC METABOLIC PANEL WITH GFR
Anion gap: 12 (ref 5–15)
BUN: 30 mg/dL — ABNORMAL HIGH (ref 6–20)
CO2: 23 mmol/L (ref 22–32)
Calcium: 9.1 mg/dL (ref 8.9–10.3)
Chloride: 104 mmol/L (ref 98–111)
Creatinine, Ser: 1.82 mg/dL — ABNORMAL HIGH (ref 0.44–1.00)
GFR, Estimated: 32 mL/min — ABNORMAL LOW (ref >60)
Glucose, Bld: 101 mg/dL — ABNORMAL HIGH (ref 70–99)
Potassium: 3.9 mmol/L (ref 3.5–5.1)
Sodium: 139 mmol/L (ref 135–145)

## 2024-05-28 ENCOUNTER — Other Ambulatory Visit (HOSPITAL_COMMUNITY): Payer: Self-pay | Admitting: Cardiology

## 2024-05-28 ENCOUNTER — Telehealth (HOSPITAL_COMMUNITY): Payer: Self-pay | Admitting: Pharmacy Technician

## 2024-05-28 MED ORDER — TORSEMIDE 20 MG PO TABS
40.0000 mg | ORAL_TABLET | Freq: Every day | ORAL | 6 refills | Status: AC
Start: 2024-05-28 — End: ?

## 2024-05-28 MED ORDER — SPIRONOLACTONE 25 MG PO TABS: ORAL_TABLET | ORAL | 3 refills | Status: AC

## 2024-05-28 MED ORDER — EMPAGLIFLOZIN 10 MG PO TABS: 10.0000 mg | ORAL_TABLET | Freq: Every day | ORAL | 6 refills | Status: AC

## 2024-05-28 NOTE — Telephone Encounter (Signed)
 [facility] Patient Advocate Encounter  Received a call from [place] (RN) with [facility] confirming the starting dosage and goal of Adempas . Confirmed that referral stated starting dose of 0.5mg  TID with a goal of 2.5mg  TID. Confirmed that he will make sure that she stops the Tadalafil  two days before starting Adempas .   Almarie JULIANNA Pa, CPhT

## 2024-05-29 ENCOUNTER — Other Ambulatory Visit (HOSPITAL_COMMUNITY)
Admission: RE | Admit: 2024-05-29 | Discharge: 2024-05-29 | Disposition: A | Discharge status: Home / Self Care | Payer: Self-pay | Source: Ambulatory Visit | Attending: Medical Genetics | Admitting: Medical Genetics

## 2024-05-30 ENCOUNTER — Ambulatory Visit: Admitting: Orthopedic Surgery

## 2024-05-30 ENCOUNTER — Encounter: Payer: Self-pay | Admitting: Orthopedic Surgery

## 2024-05-30 DIAGNOSIS — M1711 Unilateral primary osteoarthritis, right knee: Secondary | ICD-10-CM

## 2024-05-30 MED ORDER — TRIAMCINOLONE ACETONIDE 40 MG/ML IJ SUSP
40.0000 mg | INTRAMUSCULAR | Status: AC | PRN
  Administered 2024-05-30: 40 mg via INTRA_ARTICULAR

## 2024-05-30 MED ORDER — LIDOCAINE HCL 1 % IJ SOLN
5.0000 mL | INTRAMUSCULAR | Status: AC | PRN
  Administered 2024-05-30: 5 mL

## 2024-05-30 MED ORDER — BUPIVACAINE HCL 0.25 % IJ SOLN
4.0000 mL | INTRAMUSCULAR | Status: AC | PRN
  Administered 2024-05-30: 4 mL via INTRA_ARTICULAR

## 2024-05-30 NOTE — Progress Notes (Signed)
 Office Visit Note   Patient: Dawn Fowler           Date of Birth: 10-14-1966           MRN: [id number] Visit Date: 05/30/2024 Requested by: Burney Darice CROME, MD [address] [place],  [place] [address] PCP: Claudene Lacks, MD  Subjective: Chief Complaint  Patient presents with   Right Knee - Follow-up    HPI: Dawn Fowler is a 58 y.o. female who presents to the office reporting right knee pain.  Patient has a known history of right knee arthritis.  Has a tibial nail in place which could complicate total knee replacement.  She had Durolane injection at the end of April which did not help much.  She is taking Tylenol  and gabapentin .  Not really ready for knee replacement yet.  She would likely require a highly templated prosthesis.              ROS: All systems reviewed are negative as they relate to the chief complaint within the history of present illness.  Patient denies fevers or chills.  Assessment & Plan: Visit Diagnoses: No diagnosis found.  Plan: Impression is right knee arthritis.  Cortisone injection performed today.  66-month return for clinical recheck.  Continue with nonweightbearing quad strengthening exercises.  She also reports some left sided back pain.  No real groin pain.  She wants to live with the for now but this is something we could potentially work up next clinic visit.  Follow-Up Instructions: No follow-ups on file.   Orders:  No orders of the defined types were placed in this encounter.  No orders of the defined types were placed in this encounter.     Procedures: Large Joint Inj on 05/30/2024 3:25 PM Indications: diagnostic evaluation, joint swelling and pain Details: 18 G 1.5 in needle, superolateral approach  Arthrogram: No  Medications: 5 mL lidocaine  1 %; 4 mL bupivacaine  0.25 %; 40 mg triamcinolone acetonide 40 MG/ML Outcome: tolerated well, no immediate complications Procedure, treatment alternatives, risks and benefits  explained, specific risks discussed. Consent was given by the patient. Immediately prior to procedure a time out was called to verify the correct patient, procedure, equipment, support staff and site/side marked as required. Patient was prepped and draped in the usual sterile fashion.       Clinical Data: No additional findings.  Objective: Vital Signs: LMP  (LMP Unknown) Comment: pmb last 2-3 months  Physical Exam:  Constitutional: Patient appears well-developed HEENT:  Head: Normocephalic Eyes:EOM are normal Neck: Normal range of motion Cardiovascular: Normal rate Pulmonary/chest: Effort normal Neurologic: Patient is alert Skin: Skin is warm Psychiatric: Patient has normal mood and affect  Ortho Exam: Ortho exam demonstrates range of motion of the right knee of 8-1 10.  Collateral crucial ligaments are stable.  Well-healed surgical incisions over the tibial tubercle and distal and lateral to the femur.  Trace effusion in the knee.  Extensor mechanism intact and nontender  Specialty Comments:  No specialty comments available.  Imaging: No results found.   PMFS History: Patient Active Problem List   Diagnosis Date Noted   Knee pain 12/25/2023   Chronic diarrhea 08/07/2022   Breast hypertrophy 12/20/2021   Chronic diastolic (congestive) heart failure (HCC) 06/10/2020   Restrictive lung disease 05/11/2020   RVF (right ventricular failure) (HCC)    Exertional dyspnea    Chronic hypoxemic respiratory failure (HCC) 11/20/2019   OSA on CPAP 11/20/2019   Complex care  coordination 11/20/2019   Morbid obesity with BMI of 45.0-49.9, adult (HCC) 11/20/2019   Pulmonary hypertension (HCC) 10/30/2019   Gout 10/30/2019   CKD (chronic kidney disease) stage 3, GFR 30-59 ml/min (HCC) 10/30/2019   Past Medical History:  Diagnosis Date   Acute respiratory failure due to COVID-19 (HCC) 10/30/2019   Anxiety    Arthritis    CHF (congestive heart failure) (HCC)    CKD (chronic kidney  disease), stage III (HCC)    Complication of anesthesia    hard to wake up after general anesthesia - had trouble waking up after succinylcholine (consider possibility of pseudocholinesterase deficiency)   COVID-19 virus detected 11/20/2019   Tested positive for Covid in December/2020 Required hospitalization in December/2020 through January/2021 Received Redemsivir   Depression    DVT (deep venous thrombosis) (HCC) 06/09/2020   RLE   Dyspnea    Family history of adverse reaction to anesthesia    hard to wake up after general anesthesia   GERD (gastroesophageal reflux disease)    HLD (hyperlipidemia)    HTN (hypertension)    Pre-diabetes    no meds   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; 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, although unsure if she had confirmation testing done   Pulmonary HTN (HCC)    Sleep apnea    on CPAP   Trichomonas infection     Family History  Problem Relation Age of Onset   COPD Father    Cancer Mother        gallbladder and bladder    Hypertension Brother    Breast cancer Other    Diabetes Son    Breast cancer Cousin    Breast cancer Cousin    Pulmonary Hypertension Neg Hx     Past Surgical History:  Procedure Laterality Date   BREAST REDUCTION SURGERY Bilateral 12/20/2021   Procedure: bilateral breast reduction with free nipple graft;  Surgeon: Marene Sieving, MD;  Location: [facility] OR;  Service: Plastics;  Laterality: Bilateral;  4 hours   CARPAL TUNNEL RELEASE Bilateral    CESAREAN SECTION     COLONOSCOPY WITH PROPOFOL  N/A 03/12/2020   Procedure: COLONOSCOPY WITH PROPOFOL ;  Surgeon: Rollin Dover, MD;  Location: [facility] ENDOSCOPY;  Service: Endoscopy;  Laterality: N/A;   DILATATION & CURETTAGE/HYSTEROSCOPY WITH MYOSURE N/A 04/08/2020   Procedure: DILATATION & CURETTAGE/HYSTEROSCOPY WITH MYOSURE;  Surgeon: Lavoie, Marie[place]Lyne, MD;  Location: [facility] OR;  Service: Gynecology;  Laterality: N/A;  request  8:30am OR start-requested through [facility] for [facility] Gyn requests one hour   HEMOSTASIS CLIP PLACEMENT  03/12/2020   Procedure: HEMOSTASIS CLIP PLACEMENT;  Surgeon: Rollin Dover, MD;  Location: [facility] ENDOSCOPY;  Service: Endoscopy;;   LEG SURGERY Right 1988   metal rod   lumbar surgery     POLYPECTOMY  03/12/2020   Procedure: POLYPECTOMY;  Surgeon: Rollin Dover, MD;  Location: [facility] END[facility];  Service: Endoscopy;;   RIGHT HEART CATH N/A 12/18/2019   Procedure: RIGHT HEART CATH;  Surgeon: Rolan Ezra RAMAN, MD;  Location[facility] INVASIVE CV LAB;  Service: Cardiovascular;  Laterality: N/A;   RIGHT HEART CATH N/A 07/12/2020   Procedure: RIGHT HEART CATH;  Surgeon: Rolan Ezra RAMAN, MD;  Location[facility] INVASIVE CV LAB;  Service: Cardiovascular;  Laterality: N/A;   Social History   Occupational History   Not on file  Tobacco Use   Smoking status: Never   Smokeless tobacco: Never  Vaping Use   Vaping status: Never Used  Substance and Sexual Activity   Alcohol use: Not Currently   Drug use: Not Currently   Sexual activity: Not Currently    Partners: Male    Birth control/protection: Post-menopausal    Comment: 1st intercourse- 44, partners- more than 5

## 2024-06-02 NOTE — Progress Notes (Incomplete)
 ***In Progress***    Advanced Heart Failure Clinic Note     PCP:  Burney Darice CROME, MD    Cardiologist:  Dr. Rolan  HPI:  Dawn Fowler is a 58 y.o. 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  She is on CPAP at home and 6L home oxygen (says she has been on oxygen for years.).  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  She additionally has significant HTN.  She had been on Opsumit  10 + tadalafil  20 mg daily but ran out of this prior to her 12/2019 admission. She also has a history of prior DVT after long train ride, has had 2 negative V/Q scans.  She was hospitalized for COVID-19 at [facility] in 10/2019.    She was seen by [facility] after discharge from COVID admission and was noted to be volume overloaded, Lasix  was changed to torsemide  but she was unable to get to pharmacy to pick up. Subsequently, she developed progressive wt gain, nearly 20 lbs and increased dyspnea, prompting her to report back to the ED in 12/2019, where she was found to be in acute on chronic diastolic HF and readmitted for IV diuresis and AHF consultation. Echo in 12/2019 showed LVEF 55-60%, moderate RV dilation/moderate RV dysfunction. RHC 12/2019 showed elevated left and right heart filling pressures with severe pulmonary hypertension. PFTs completed showing severe restriction and severely decreased DLCO. High resolution CT was concerning for small vessel disease, no ILD, and concerning for PAH.  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 She was continued on IV diuretics and had good response, diuresing 32 lb. She was transitioned to torsemide  40 mg/20 mg. Opsumit  and tadalafil  restarted.    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  She is on Eliquis . RHC in 06/2020 showed normal RA pressure and PCWP, moderate-severe pulmonary arterial hypertension.    Echo 02/2021 showed EF 55% with mildly enlarged RV with mild RV dysfunction, mildly D-shaped septum, PASP 48 with normal IVC.    In 11/2021, she had bilateral breast reduction surgery. She developed significant volume overload and her torsemide  was increased.   She moved to [place]  and had RHC there in 12/2023; normal PCWP and RA pressure but PA 87/32 mean 54 with PVR 11 WU and PCWP 15, CI 1.8 (thermo). V/Q scan in 12/2023 showed no sign of chronic PEs.    Follow up 12/2023, recently moved back to [place] from [place] . Sotatercept initiated given ongoing significantly elevated PA pressures.    Echo 02/2024 showed EF 55-60%, D-shaped septum with moderate RV dysfunction, PASP 49 mmHg.    Returned to [facility] for followup of CHF 05/13/24.  She stopped sotatercept due to severe headache after getting injections.  She did not not want to retry it.  BP had been very high recently, 180/110 in clinic.  Weight was up 3 lbs.  She continued on home oxygen 4L [place] and CPAP at night. She was following a generally high sodium diet. Poor stamina.  She denied dyspnea walking on flat ground with her walker.  No lightheadedness or palpitations.  No chest pain.   Today she returns to HF clinic for pharmacist medication  titration. At last visit with MD tadalafil  was changed to Adempas . Additionally, losartan  was increased to 25 mg daily and amlodipine  5 mg was initiated.    Shortness of breath/dyspnea on exertion? {YES NO:22349}  Orthopnea/PND? {YES NO:22349} Edema? {YES NO:22349} Lightheadedness/dizziness? {YES NO:22349} Daily weights at home? {YES NO:22349} Blood pressure/heart rate monitoring at home? {YES [id number] Following low-sodium/fluid-restricted diet? {YES NO:22349}  HF Medications:   Has the patient been experiencing any side effects to the medications  prescribed?  {YES NO:22349}  Does the patient have any problems obtaining medications due to transportation or finances?   {YES NO:22349}  Understanding of regimen: {excellent/good/fair/poor:19665} Understanding of indications: {excellent/good/fair/poor:19665} Potential of compliance: {excellent/good/fair/poor:19665} Patient understands to avoid NSAIDs. Patient understands to avoid decongestants.    Pertinent Lab Values: 05/23/24: Serum creatinine 1.82, BUN 30, Potassium 3.9, Sodium 139, BNP 98  Vital Signs: Weight: *** (last clinic weight: ***) Blood pressure: ***  Heart rate: ***   Assessment/Plan: 1. Chronic diastolic CHF with significant RV dysfunction: Echo in 12/2019 with LVEF 55-60%, moderate RV dilation/moderate RV dysfunction.  RHC 11/2016 showed elevated left and right heart filling pressures with severe pulmonary hypertension.  Suspect mixed pulmonary venous/pulmonary arterial hypertension in setting of significant LV diastolic dysfunction (PVR 3.87 WU).  Repeat RHC in 06/2020 with moderate-severe PAH, normal filling pressures, and PVR 5.6 WU.  No evidence for left=>right shunting.  Echo in 02/2021 showed EF 55% with mildly enlarged RV with mild RV dysfunction, mildly D-shaped septum, PASP 48 with normal IVC.  Echo in 02/2024 showed EF 55-60%, D-shaped septum with moderate RV dysfunction, PASP 49 mmHg.   - NYHA class II-III.  She is not volume overloaded on exam.  - Continue torsemide  40 mg BID.  - Continue spironolactone  12.5 mg daily. - Continue Farxiga  10 mg daily.  2. Pulmonary hypertension: Suspect that this is a mixed picture with group 2 and group 3 PH (related to diastolic LV dysfunction and OHS/OSA). However, concerned for component of group 1 PAH as well.  V/Q scan not suggestive of chronic PEs and high resolution CT chest in 12/2019 was not suggestive of ILD. HIV and rheumatologic serologies negative.  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 PFTs completed in 12/2019 showed severe restriction, severely decreased DLCO (?restriction due to body habitus).  Echo in 02/2024 showed D-shaped septum with moderate RV dysfunction and PASP 49 mmHg.  V/Q scan in 12/2023 again showed no evidence for chronic PE, and RHC in 12/2023 showed normal RA pressure, severe pulmonary arterial hypertension with PVR 11 WU, and PCWP borderline at 15 with low CI. 6 minute walk 05/13/24 was improved.  - Continue to maintain oxygen saturation with home O2 and CPAP. Weight loss also would be helpful.  She has had a breast reduction surgery which may decrease lung restriction.  - Continue Opsumit  10 mg daily. - Continue Adempas  *** mg TID. Recently transitioned from tadalafil .  - She is tolerating Uptravi  800 mcg BID now, she has not tolerated uptitration.  - Failed sotatercept with severe headaches.  3. HTN: BP poorly controlled.  - Continue losartan  25 mg daily - Continue amlodipine  5 mg daily  4. CKD stage 3.  - Continue SGLT2-i.  - Followup with nephrology.  5. OHS/OSA: Now s/p breast reduction surgery which hopefully will help with OHS. Compliant w/ CPAP and home oxygen, no change. 6. 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  As she is at baseline sedentary  and has had recurrent DVTs, would continue Eliquis  long-term.   7. Restrictive lung disease: Now s/p breast reduction surgery.  8. Obesity: Body mass index is 43.59 kg/m. Continue to work on weight loss.   - Referred to PharmD for  GLP-1 agonist and was seen 05/12/24, waiting to hear if she can get coverage.  Follow up ***   Tinnie Redman, PharmD, BCPS, BCCP, CPP Heart Failure Clinic Pharmacist [phone]

## 2024-06-09 LAB — GENECONNECT MOLECULAR SCREEN: Genetic Analysis Overall Interpretation: NEGATIVE

## 2024-06-10 ENCOUNTER — Telehealth: Payer: Self-pay | Admitting: Pharmacist

## 2024-06-10 ENCOUNTER — Other Ambulatory Visit (HOSPITAL_COMMUNITY): Payer: Self-pay

## 2024-06-10 MED ORDER — ZEPBOUND 5 MG/0.5ML ~~LOC~~ SOAJ
5.0000 mg | SUBCUTANEOUS | 0 refills | Status: DC
  Filled 2024-06-10: qty 2, 28d supply, fill #0

## 2024-06-10 NOTE — Telephone Encounter (Signed)
 Tolerates current Zepbound  dose well,prescription for 5 mg sent to the pharmacy.

## 2024-06-12 ENCOUNTER — Other Ambulatory Visit (HOSPITAL_COMMUNITY)

## 2024-06-12 ENCOUNTER — Telehealth (HOSPITAL_COMMUNITY): Payer: Self-pay | Admitting: Cardiology

## 2024-06-12 NOTE — Telephone Encounter (Signed)
 Would recommend OTC Pepcid  (famotidine ) as first line option. If that is not effective, she can try omeprazole (Prilosec), which can also be obtained OTC.

## 2024-06-12 NOTE — Telephone Encounter (Signed)
 Patient also requested RX for motor scooter   -advised this evaluation may be best managed with PCP,however will send as requested to provider for input

## 2024-06-12 NOTE — Progress Notes (Incomplete)
 ***In Progress***    Advanced Heart Failure Clinic Note     PCP:  Burney Darice CROME, MD    Cardiologist:  Dr. Rolan  HPI:  Dawn Fowler is a 58 y.o. 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  She is on CPAP at home and 6L home oxygen (says she has been on oxygen for years.).  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  She additionally has significant HTN.  She had been on Opsumit  10 + tadalafil  20 mg daily but ran out of this prior to her 12/2019 admission. She also has a history of prior DVT after long train ride, has had 2 negative V/Q scans.  She was hospitalized for COVID-19 at [facility] in 10/2019.    She was seen by [facility] after discharge from COVID admission and was noted to be volume overloaded, Lasix  was changed to torsemide  but she was unable to get to pharmacy to pick up. Subsequently, she developed progressive wt gain, nearly 20 lbs and increased dyspnea, prompting her to report back to the ED in 12/2019, where she was found to be in acute on chronic diastolic HF and readmitted for IV diuresis and AHF consultation. Echo in 12/2019 showed LVEF 55-60%, moderate RV dilation/moderate RV dysfunction. RHC 12/2019 showed elevated left and right heart filling pressures with severe pulmonary hypertension. PFTs completed showing severe restriction and severely decreased DLCO. High resolution CT was concerning for small vessel disease, no ILD, and concerning for PAH.  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 She was continued on IV diuretics and had good response, diuresing 32 lb. She was transitioned to torsemide  40 mg/20 mg. Opsumit  and tadalafil  restarted.    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  She is on Eliquis . RHC in 06/2020 showed normal RA pressure and PCWP, moderate-severe pulmonary arterial hypertension.    Echo 02/2021 showed EF 55% with mildly enlarged RV with mild RV dysfunction, mildly D-shaped septum, PASP 48 with normal IVC.    In 11/2021, she had bilateral breast reduction surgery. She developed significant volume overload and her torsemide  was increased.   She moved to [place]  and had RHC there in 12/2023; normal PCWP and RA pressure but PA 87/32 mean 54 with PVR 11 WU and PCWP 15, CI 1.8 (thermo). V/Q scan in 12/2023 showed no sign of chronic PEs.    Follow up 12/2023, recently moved back to [place] from [place] . Sotatercept initiated given ongoing significantly elevated PA pressures.    Echo 02/2024 showed EF 55-60%, D-shaped septum with moderate RV dysfunction, PASP 49 mmHg.    Returned to [facility] for followup of CHF 05/13/24.  She stopped sotatercept due to severe headache after getting injections.  She did not not want to retry it.  BP had been very high recently, 180/110 in clinic.  Weight was up 3 lbs.  She continued on home oxygen 4L [place] and CPAP at night. She was following a generally high sodium diet. Poor stamina.  She denied dyspnea walking on flat ground with her walker.  No lightheadedness or palpitations.  No chest pain.   Today she returns to HF clinic for pharmacist medication  titration. At last visit with MD tadalafil  was changed to Adempas . Additionally, losartan  was increased to 25 mg daily and amlodipine  5 mg was initiated.     -what dose of torsemdie - 40 daily on med rec, 40 bid in notes -recently started zepbound   Overall feeling ***. Dizziness, lightheadedness, fatigue:  Chest pain or palpitations:  How is your breathing?: *** SOB: Able to complete all ADLs. Activity level ***  Weight at home pounds. Takes furosemide /torsemide /bumex *** mg *** daily.  LEE PND/Orthopnea  Appetite *** Low-salt diet:   Physical  Exam Cost/affordability of meds  -no labs -inc amlodip to 10 for bp control vs losartan  to 50 (fyi actively uptitating ademaps) -r/s farxiga , not filled since 07/2023 -DM 9/17  Shortness of breath/dyspnea on exertion? {YES NO:22349}  Orthopnea/PND? {YES NO:22349} Edema? {YES NO:22349} Lightheadedness/dizziness? {YES NO:22349} Daily weights at home? {YES NO:22349} Blood pressure/heart rate monitoring at home? {YES [id number] Following low-sodium/fluid-restricted diet? {YES NO:22349}  HF Medications:   Has the patient been experiencing any side effects to the medications prescribed?  {YES NO:22349}  Does the patient have any problems obtaining medications due to transportation or finances?   {YES NO:22349}  Understanding of regimen: {excellent/good/fair/poor:19665} Understanding of indications: {excellent/good/fair/poor:19665} Potential of compliance: {excellent/good/fair/poor:19665} Patient understands to avoid NSAIDs. Patient understands to avoid decongestants.    Pertinent Lab Values: 05/23/24: Serum creatinine 1.82, BUN 30, Potassium 3.9, Sodium 139, BNP 98  Vital Signs: Weight: *** (last clinic weight: ***) Blood pressure: ***  Heart rate: ***   Assessment/Plan: 1. Chronic diastolic CHF with significant RV dysfunction: Echo in 12/2019 with LVEF 55-60%, moderate RV dilation/moderate RV dysfunction.  RHC 11/2016 showed elevated left and right heart filling pressures with severe pulmonary hypertension.  Suspect mixed pulmonary venous/pulmonary arterial hypertension in setting of significant LV diastolic dysfunction (PVR 3.87 WU).  Repeat RHC in 06/2020 with moderate-severe PAH, normal filling pressures, and PVR 5.6 WU.  No evidence for left=>right shunting.  Echo in 02/2021 showed EF 55% with mildly enlarged RV with mild RV dysfunction, mildly D-shaped septum, PASP 48 with normal IVC.  Echo in 02/2024 showed EF 55-60%, D-shaped septum with moderate RV dysfunction, PASP 49 mmHg.   -  NYHA class II-III.  She is not volume overloaded on exam.  - Continue torsemide  40 mg BID. *** - Continue spironolactone  12.5 mg daily. - Continue Farxiga  10 mg daily.  2. Pulmonary hypertension: Suspect that this is a mixed picture with group 2 and group 3 PH (related to diastolic LV dysfunction and OHS/OSA). However, concerned for component of group 1 PAH as well.  V/Q scan not suggestive of chronic PEs and high resolution CT chest in 12/2019 was not suggestive of ILD. HIV and rheumatologic serologies negative.  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 PFTs completed in 12/2019 showed severe restriction, severely decreased DLCO (?restriction due to body habitus).  Echo in 02/2024 showed D-shaped septum with moderate RV dysfunction and PASP 49 mmHg.  V/Q scan in 12/2023 again showed no evidence for chronic PE, and RHC in 12/2023 showed normal RA pressure, severe pulmonary arterial hypertension with PVR 11 WU, and PCWP borderline at 15 with low CI. 6 minute walk 05/13/24 was improved.  - Continue to maintain oxygen saturation with home O2 and CPAP. Weight loss also would be helpful.  She has had a breast reduction surgery which may decrease lung restriction.  - Continue Opsumit  10 mg daily. - Continue Adempas  *** mg TID.  Recently transitioned from tadalafil .  - She is tolerating Uptravi  800 mcg BID now, she has not tolerated uptitration.  - Failed sotatercept with severe headaches.  3. HTN: BP poorly controlled.  - Continue losartan  25 mg daily - Continue amlodipine  5 mg daily  4. CKD stage 3.  - Continue SGLT2-i.  - Followup with nephrology.  5. OHS/OSA: Now s/p breast reduction surgery which hopefully will help with OHS. Compliant w/ CPAP and home oxygen, no change. 6. 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  As she is at baseline sedentary and has had recurrent DVTs, would continue Eliquis  long-term.   7.  Restrictive lung disease: Now s/p breast reduction surgery.  8. Obesity: Body mass index is 43.59 kg/m. Continue to work on weight loss.   - Recently started Zepbound , tolerating 5 mg weekly dose well.   Follow up ***   Tinnie Redman, PharmD, BCPS, BCCP, CPP Heart Failure Clinic Pharmacist [phone]

## 2024-06-12 NOTE — Telephone Encounter (Signed)
 Patient called to request medications recommendations for acid reflux.  Reports she is having a hard time with reflux at the moment and was told to NOT take any calcium based medications/chalky meds per the [facility]  PHARM TEAM   Will Dr Rolan write script or recommend alternative OTC options

## 2024-06-12 NOTE — Telephone Encounter (Signed)
 Pt aware of OTC options

## 2024-06-15 ENCOUNTER — Other Ambulatory Visit: Payer: Self-pay | Admitting: Cardiology

## 2024-06-17 ENCOUNTER — Other Ambulatory Visit (HOSPITAL_COMMUNITY): Payer: Self-pay

## 2024-06-26 ENCOUNTER — Other Ambulatory Visit (HOSPITAL_COMMUNITY)

## 2024-06-27 NOTE — Telephone Encounter (Signed)
 PA for Zepbound approved

## 2024-06-27 NOTE — Progress Notes (Incomplete)
 ***In Progress***    Advanced Heart Failure Clinic Note     PCP:  Burney Darice CROME, MD    Cardiologist:  Dr. Rolan  HPI:  Dawn Fowler is a 58 y.o. 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  She is on CPAP at home and 6L home oxygen (says she has been on oxygen for years.).  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  She additionally has significant HTN.  She had been on Opsumit  10 + tadalafil  20 mg daily but ran out of this prior to her 12/2019 admission. She also has a history of prior DVT after long train ride, has had 2 negative V/Q scans.  She was hospitalized for COVID-19 at [facility] in 10/2019.    She was seen by [facility] Pulmonology after discharge from COVID admission and was noted to be volume overloaded, Lasix  was changed to torsemide  but she was unable to get to pharmacy to pick up. Subsequently, she developed progressive wt gain, nearly 20 lbs and increased dyspnea, prompting her to report back to the ED in 12/2019, where she was found to be in acute on chronic diastolic HF and readmitted for IV diuresis and AHF consultation. Echo in 12/2019 showed LVEF 55-60%, moderate RV dilation/moderate RV dysfunction. RHC 12/2019 showed elevated left and right heart filling pressures with severe pulmonary hypertension. PFTs completed showing severe restriction and severely decreased DLCO. High resolution CT was concerning for small vessel disease, no ILD, and concerning for PAH.  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 She was continued on IV diuretics and had good response, diuresing 32 lb. She was transitioned to torsemide  40 mg/20 mg. Opsumit  and tadalafil  restarted.    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  She is on Eliquis . RHC in 06/2020 showed normal RA pressure and PCWP, moderate-severe pulmonary arterial hypertension.    Echo 02/2021 showed EF 55% with mildly enlarged RV with mild RV dysfunction, mildly D-shaped septum, PASP 48 with normal IVC.    In 11/2021, she had bilateral breast reduction surgery. She developed significant volume overload and her torsemide  was increased.   She moved to [place]  and had RHC there in 12/2023; normal PCWP and RA pressure but PA 87/32 mean 54 with PVR 11 WU and PCWP 15, CI 1.8 (thermo). V/Q scan in 12/2023 showed no sign of chronic PEs.    Follow up 12/2023, recently moved back to [place] from [place] . Sotatercept initiated given ongoing significantly elevated PA pressures.    Echo 02/2024 showed EF 55-60%, D-shaped septum with moderate RV dysfunction, PASP 49 mmHg.    Returned to [facility] for followup of CHF 05/13/24.  She stopped sotatercept due to severe headache after getting injections.  She did not not want to retry it.  BP had been very high recently, 180/110 in clinic.  Weight was up 3 lbs.  She continued on home oxygen 4L [place] and CPAP at night. She was following a generally high sodium diet. Poor stamina.  She denied dyspnea walking on flat ground with her walker.  No lightheadedness or palpitations.  No chest pain.   Today she returns to HF clinic for pharmacist medication  titration. At last visit with MD tadalafil  was changed to Adempas . Additionally, losartan  was increased to 25 mg daily and amlodipine  5 mg was initiated. Has also started Mounjaro , now on 5 mg weekly.     -what dose of torsemdie - 40 daily on med rec, 40 bid in notes -recently started zepbound   Overall feeling ***. Dizziness, lightheadedness, fatigue:  Chest pain or palpitations:  How is your breathing?: *** SOB: Able to complete all ADLs. Activity level ***  Weight at home pounds. Takes furosemide /torsemide /bumex *** mg *** daily.   LEE PND/Orthopnea  Appetite *** Low-salt diet:   Physical Exam Cost/affordability of meds  -no labs -inc amlodip to 10 for bp control vs losartan  to 50 (fyi actively uptitating ademaps) -r/s farxiga , not filled since 07/2023 -DM 9/17  Shortness of breath/dyspnea on exertion? {YES NO:22349}  Orthopnea/PND? {YES NO:22349} Edema? {YES NO:22349} Lightheadedness/dizziness? {YES NO:22349} Daily weights at home? {YES NO:22349} Blood pressure/heart rate monitoring at home? {YES [id number] Following low-sodium/fluid-restricted diet? {YES NO:22349}  HF Medications:   Has the patient been experiencing any side effects to the medications prescribed?  {YES NO:22349}  Does the patient have any problems obtaining medications due to transportation or finances?   {YES NO:22349}  Understanding of regimen: {excellent/good/fair/poor:19665} Understanding of indications: {excellent/good/fair/poor:19665} Potential of compliance: {excellent/good/fair/poor:19665} Patient understands to avoid NSAIDs. Patient understands to avoid decongestants.    Pertinent Lab Values: 05/23/24: Serum creatinine 1.82, BUN 30, Potassium 3.9, Sodium 139, BNP 98  Vital Signs: Weight: *** (last clinic weight: ***) Blood pressure: ***  Heart rate: ***   Assessment/Plan: 1. Chronic diastolic CHF with significant RV dysfunction: Echo in 12/2019 with LVEF 55-60%, moderate RV dilation/moderate RV dysfunction.  RHC 11/2016 showed elevated left and right heart filling pressures with severe pulmonary hypertension.  Suspect mixed pulmonary venous/pulmonary arterial hypertension in setting of significant LV diastolic dysfunction (PVR 3.87 WU).  Repeat RHC in 06/2020 with moderate-severe PAH, normal filling pressures, and PVR 5.6 WU.  No evidence for left=>right shunting.  Echo in 02/2021 showed EF 55% with mildly enlarged RV with mild RV dysfunction, mildly D-shaped septum, PASP 48 with normal IVC.  Echo in 02/2024 showed EF 55-60%,  D-shaped septum with moderate RV dysfunction, PASP 49 mmHg.   - NYHA class II-III.  She is not volume overloaded on exam.  - Continue torsemide  40 mg BID. *** - Continue spironolactone  12.5 mg daily. - Continue Farxiga  10 mg daily.  2. Pulmonary hypertension: Suspect that this is a mixed picture with group 2 and group 3 PH (related to diastolic LV dysfunction and OHS/OSA). However, concerned for component of group 1 PAH as well.  V/Q scan not suggestive of chronic PEs and high resolution CT chest in 12/2019 was not suggestive of ILD. HIV and rheumatologic serologies negative.  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 PFTs completed in 12/2019 showed severe restriction, severely decreased DLCO (?restriction due to body habitus).  Echo in 02/2024 showed D-shaped septum with moderate RV dysfunction and PASP 49 mmHg.  V/Q scan in 12/2023 again showed no evidence for chronic PE, and RHC in 12/2023 showed normal RA pressure, severe pulmonary arterial hypertension with PVR 11 WU, and PCWP borderline at 15 with low CI. 6 minute walk 05/13/24 was improved.  - Continue to maintain oxygen saturation with home O2 and CPAP. Weight loss also would be helpful.  She has had a breast reduction surgery which may decrease lung restriction.  - Continue Opsumit   10 mg daily. - Continue Adempas  *** mg TID. Recently transitioned from tadalafil .  - She is tolerating Uptravi  800 mcg BID now, she has not tolerated uptitration.  - Failed sotatercept with severe headaches.  3. HTN: BP poorly controlled.  - Continue losartan  25 mg daily - Continue amlodipine  5 mg daily  4. CKD stage 3.  - Continue SGLT2-i.  - Followup with nephrology.  5. OHS/OSA: Now s/p breast reduction surgery which hopefully will help with OHS. Compliant w/ CPAP and home oxygen, no change. 6. 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  As she is at baseline sedentary and has  had recurrent DVTs, would continue Eliquis  long-term.   7. Restrictive lung disease: Now s/p breast reduction surgery.  8. Obesity: Body mass index is 43.59 kg/m. Continue to work on weight loss.   - Recently started Zepbound , tolerating 5 mg weekly dose well.   Follow up ***   Tinnie Redman, PharmD, BCPS, BCCP, CPP Heart Failure Clinic Pharmacist [phone]

## 2024-07-09 ENCOUNTER — Other Ambulatory Visit (HOSPITAL_COMMUNITY)

## 2024-07-09 ENCOUNTER — Encounter (HOSPITAL_COMMUNITY): Payer: Self-pay

## 2024-07-09 ENCOUNTER — Other Ambulatory Visit (HOSPITAL_COMMUNITY): Payer: Self-pay

## 2024-07-09 MED ORDER — ZEPBOUND 7.5 MG/0.5ML ~~LOC~~ SOAJ
7.5000 mg | SUBCUTANEOUS | 0 refills | Status: DC
  Filled 2024-07-09: qty 2, 28d supply, fill #0

## 2024-07-09 NOTE — Addendum Note (Signed)
 Addended by: Jonna Dittrich K on: 07/09/2024 07:29 AM   Modules accepted: Orders

## 2024-07-15 ENCOUNTER — Ambulatory Visit (INDEPENDENT_AMBULATORY_CARE_PROVIDER_SITE_OTHER): Admitting: Pulmonary Disease

## 2024-07-15 ENCOUNTER — Encounter (HOSPITAL_BASED_OUTPATIENT_CLINIC_OR_DEPARTMENT_OTHER): Payer: Self-pay | Admitting: Pulmonary Disease

## 2024-07-15 VITALS — BP 129/101 | HR 85 | Ht 60.0 in | Wt 220.4 lb

## 2024-07-15 DIAGNOSIS — G4733 Obstructive sleep apnea (adult) (pediatric): Secondary | ICD-10-CM | POA: Diagnosis not present

## 2024-07-15 DIAGNOSIS — J9611 Chronic respiratory failure with hypoxia: Secondary | ICD-10-CM

## 2024-07-15 DIAGNOSIS — I2722 Pulmonary hypertension due to left heart disease: Secondary | ICD-10-CM | POA: Diagnosis not present

## 2024-07-15 DIAGNOSIS — J45909 Unspecified asthma, uncomplicated: Secondary | ICD-10-CM

## 2024-07-15 DIAGNOSIS — J984 Other disorders of lung: Secondary | ICD-10-CM

## 2024-07-15 DIAGNOSIS — I5032 Chronic diastolic (congestive) heart failure: Secondary | ICD-10-CM

## 2024-07-15 DIAGNOSIS — I2723 Pulmonary hypertension due to lung diseases and hypoxia: Secondary | ICD-10-CM

## 2024-07-15 DIAGNOSIS — R0602 Shortness of breath: Secondary | ICD-10-CM

## 2024-07-15 MED ORDER — FLUTICASONE FUROATE-VILANTEROL 200-25 MCG/ACT IN AEPB: 1.0000 | INHALATION_SPRAY | Freq: Every day | RESPIRATORY_TRACT | 11 refills | Status: AC

## 2024-07-15 NOTE — Progress Notes (Unsigned)
 Subjective:   PATIENT ID: Dawn Fowler GENDER: female DOB: January 02, 1966, MRN: [id number]   Chief Complaint  Patient presents with  . 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  Synopsis: Previously followed for chronic hypoxemic failure 2/2 PH and severe OSA at the [facility] in [place], [place]. Work-up included RHC 12/2016 RA 19 PA 76/40/48 PCWP 20 CO 4.02L/min CI 1.9, V/Q scan (neg), PFTs 06/2018 (mod restrictive defect), CT Chest (no evidence of ILD). Started on Opsumit  and tadalafil . Moved to [place] in 2020.  She was admitted for acute diastolic heart failure for IV diuretics. She underwent repeat work-up for her PH including RHC 12/2019 which demonstrated elevated right and left heart filling pressure with severe PH, V/Q scan (neg), PFT 12/2019 (severe restriction and reduced DLCO), HRCT (no evidence of ILD) and negative serologies.  She wears 4-8L at baseline. She wears a CPAP however sleep study 07/01/20 demonstrated nocturnal hypoxemia. CPAP has demonstrted bened  08/20/20 Since our last visit, she had a sleep study completed however did not sleep without interruption and felt the experience was overall poor.  She did not feel the sleep study was representative of what she did at home.  Otherwise she reports her dyspnea is at baseline and that she has been compliant with therapies including her home oxygen 4 L at rest and 8 L on exertion.  She continues to wear her CPAP which she finds comfortable and improves her dyspnea and quality of sleep.  When she does not wear her CPAP she notices a difference in her breathing. She has been participating PT and noticed her SpO2 to low 80s on 6L via [place]. She recovers within 2 minutes with rest with sats to upper  90s. Cardiology has added uptravi  to her current regiment of Opsumit  and tadalfil. She has diarrhea that is controlled lomotil. She has been referred to weight loss clinic but not heard from it. She is considering a breast reduction.  Denies dizziness, chest pain, cough, wheezing or leg swelling.  No recent infections.  She also has questions about Covid and shingles vaccines.  08/02/21 Since our last visit, she has had back issues. She has been referred to Plastics to evaluate for breast reduction surgery as she feels this significantly affects her breathing. She has restarted Breo and is compliant with her oxygen 8L on exertion. Compliant with CPAP which provide clinically benefit with use including improved quality of sleep and energy. She is able to ambulate within the house but has difficulty with upper body activities that cause her to be winded. 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 She is compliant with her Uptravi  and Opsumit . Cardiology started her on Farxiga  and she reports now having a yeast infection that began 2-3 days ago with itching and odor.   07/15/24 She has not been seen in clinic since 08/04/22 with NP Cobb. She had briefly moved to [place]  and saw a pulmonologist there. She reports worsening shortness of breath with activity including household chores that require bending over are difficulty for her. Reports cough that is nonproductive throughout the day and worsens at night. Cough worsened by acid reflux. She is wearing  oxygen 4-8L O2. She has a CPAP but is not established with DME company yet for supplies. Followed by Cardiology for [facility] and on torsemide , spiro and farxiga  and Uptravi . Not on sotatercept due to headaches, Zepbound   Social history: Social stressors with her son and daughter-in-law has been contributing to her depression Friend planning to move in for  support  Past Medical History:  Diagnosis Date  . Acute respiratory failure due to COVID-19 (HCC) 10/30/2019  . Anxiety   . Arthritis   . CHF (congestive heart failure) (HCC)   . CKD (chronic kidney disease), stage III (HCC)   . Complication of anesthesia    hard to wake up after general anesthesia - had trouble waking up after succinylcholine (consider possibility of pseudocholinesterase deficiency)  . COVID-19 virus detected 11/20/2019   Tested positive for Covid in December/2020 Required hospitalization in December/2020 through January/2021 Received Redemsivir  . Depression   . DVT (deep venous thrombosis) (HCC) 06/09/2020   RLE  . Dyspnea   . Family history of adverse reaction to anesthesia    hard to wake up after general anesthesia  . GERD (gastroesophageal reflux disease)   . HLD (hyperlipidemia)   . HTN (hypertension)   . Pre-diabetes    no meds  . 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 Pulmonary HTN (HCC)   . Sleep apnea    on CPAP  . Trichomonas infection     Outpatient Medications Prior to Visit  Medication Sig Dispense Refill  . acetaminophen  (TYLENOL ) 500 MG tablet Take 1,000 mg by mouth every 6 (six) hours as needed for moderate pain or headache.    . albuterol  (VENTOLIN  HFA) 108 (90 Base) MCG/ACT inhaler Inhale 2 puffs into the lungs every 6 (six) hours as needed for wheezing or shortness of breath. 8 g 5  . allopurinol  (ZYLOPRIM ) 100 MG tablet TAKE 1 TABLET(100 MG) BY MOUTH DAILY 30 tablet 2  . amLODipine  (NORVASC ) 5 MG tablet Take 1 tablet (5 mg total) by mouth daily. 90 tablet 3  . apixaban  (ELIQUIS ) 5 MG TABS tablet Take 1 tablet (5 mg total) by mouth 2 (two) times daily. 60 tablet 9  . Cholecalciferol  (VITAMIN D3) 125 MCG (5000 UT) CAPS Take 5,000 Units by mouth in the morning.    . colchicine 0.6 MG tablet  Take 0.6 mg by mouth daily as needed (gout flare).    . diclofenac Sodium (VOLTAREN) 1 % GEL Apply 2 g topically 3 (three) times daily as needed (pain.).    . empagliflozin  (JARDIANCE ) 10 MG TABS tablet Take 1 tablet (10 mg total) by mouth daily. 30 tablet 6  . fluticasone  furoate-vilanterol (BREO ELLIPTA ) 100-25 MCG/ACT AEPB Inhale 1 puff into the lungs daily. 60 each 0  . gabapentin  (NEURONTIN ) 100 MG capsule Take 100 mg by mouth as needed.    SABRA ipratropium-albuterol  (DUONEB) 0.5-2.5 (3) MG/3ML SOLN INHALE THE CONTENTS OF 1 VIAL VIA NEBULIZER EVERY 6 HOURS AS NEEDED FOR WHEEZING OR SHORTNESS OF BREATH 1080 mL 2  . losartan  (COZAAR ) 25 MG tablet Take 1 tablet (25 mg total) by mouth daily. 90 tablet 3  . macitentan  (OPSUMIT ) 10 MG tablet Take 1 tablet (10 mg total) by mouth daily. 90 tablet 3  . OXYGEN Inhale 4-8 L into the lungs continuous.    . Riociguat  (ADEMPAS ) 0.5 MG TABS Take 0.5 mg by mouth  in the morning, at noon, and at bedtime. (Patient taking differently: Take 0.1 mg by mouth in the morning, at noon, and at bedtime.)    . Selexipag  (UPTRAVI ) 800 MCG TABS Take 1 tablet (800 mcg total) by mouth 2 (two) times daily. 60 tablet 0  . sertraline  (ZOLOFT ) 50 MG tablet Take 1 tablet (50 mg total) by mouth 2 (two) times daily. 60 tablet 0  . spironolactone  (ALDACTONE ) 25 MG tablet TAKE 1/2 TABLET(12.5 MG) BY MOUTH DAILY 45 tablet 3  . tirzepatide  (ZEPBOUND ) 7.5 MG/0.5ML Pen Inject 7.5 mg into the skin once a week. 2 mL 0  . torsemide  (DEMADEX ) 20 MG tablet Take 2 tablets (40 mg total) by mouth daily. 60 tablet 6  . Calcium Carbonate Antacid (TUMS E-X PO) Take 1-2 tablets by mouth 3 (three) times daily as needed (gas/abdominal pain.). (Patient not taking: Reported on 07/15/2024)    . diphenhydramine -acetaminophen  (TYLENOL  PM) 25-500 MG TABS tablet Take 2 tablets by mouth at bedtime as needed (sleep). (Patient not taking: Reported on 07/15/2024)    . potassium chloride  SA (KLOR-CON  M) 20 MEQ tablet  Take 1 tablet (20 mEq total) by mouth daily. (Patient not taking: Reported on 07/15/2024) 90 tablet 3  . vitamin B-12 (CYANOCOBALAMIN) 50 MCG tablet Take 50 mcg by mouth daily. (Patient not taking: Reported on 07/15/2024)     No facility-administered medications prior to visit.    Review of Systems  Constitutional:  Negative for chills, diaphoresis, fever, malaise/fatigue and weight loss.  HENT:  Negative for congestion.   Respiratory:  Positive for shortness of breath. Negative for cough, hemoptysis, sputum production and wheezing.   Cardiovascular:  Negative for chest pain, palpitations and leg swelling.  Musculoskeletal:  Positive for back pain.     Objective:   Vitals:   07/15/24 0905  BP: (!) 129/101  Pulse: 85  SpO2: 93%  Weight: 220 lb 6.4 oz (100 kg)  Height: 5' (1.524 m)  Body mass index is 43.04 kg/m.  SpO2: 93 % (on 4L continuous)  Physical Exam: General: Well-appearing, no acute distress HENT: [place], AT Eyes: EOMI, no scleral icterus Respiratory: ***Clear to auscultation bilaterally.  No crackles, wheezing or rales Cardiovascular: RRR, -M/R/G, no JVD Extremities:-Edema,-tenderness Neuro: AAO x4, CNII-XII grossly intact Psych: Normal mood, normal affect  Data Reviewed:  Imaging:  12/25/2014-VQ scan-mild obstructive airways disease, very low probability of PE  12/18/2014-CTA anterior-no evidence of PE, cardiomegaly, dependent airspace trapping which may be related to morbid obesity or small airways disease ^Unable to view images but can read radiology report  07/26/18 CT Chest WO-(report only) enlarged MPA and centroal pulmonary arteries consistent with hx of pulmonary hypertension.  Stable predominantly basilar mosaic attenuation reflects either small vessel or small airways disease.  Persistent predominant subcarinal, paraesophageal and bilateral infrahilar lymph node enlargement unchanged from 4 months ago.  Stable small perifissural nodule likely representing  intranodular lymph node.  Other concerning parenchymal lesions not seen. -Report does not mention any interstitial lung disease.  05/11/2020- CXR cardiomegaly. No pulmonary edema or infiltrate  12/21/19 -VQ scan- no evidence of chronic PE  12/21/19 CT Chest - No parenchymal abnormalities. Mosaic attenuation. Enlarged PAH  PFT: 02/11/2015-office spirometry-FVC 2.14 L, FEV1 1.78, ratio 83  12/22/19  FVC 1.43 (48%) FEV1 1.27 (54%) Ratio 87  TLC 64% DLCO 32% Interpretation: No evidence of obstructive defect however significant response to bronchodilators suggestive of asthma. Co-comitant severe restrictive lung defect with reduced gas exchange also present.  Echo 12/21/2014 LV ejection fraction  60 to 65%, moderate pulmonary hypertension, moderate diastolic dysfunction  12/21/19  LV EF 55-60%, moderately dilated RV with decreased systolic function    12/18/19 RHC Procedural Findings: Hemodynamics (mmHg) RA mean 14 RV 87/21 PA 89/33, mean 54 PCWP mean 34 Oxygen saturations: SVC 63% PA 61% AO 94% Cardiac Output (Fick) 5.17  Cardiac Index (Fick) 2.22 PVR 3.87 WU  07/01/20 Split night study - Nocturnal hypoxemia  CPAP Compliance 07/02/21-07/31/21 Usage days 100% >4 hours 100% AHI 0.9 on CPAP 9 cm H20  Assessment & Plan:   Discussion: 58 year old female never smoker who presents for follow-up for severe OSA and asthma management. Following Cardiology for pulmonary hypertension. Recent weight gain and worsening shortness of breath. Patient has not taken additional diuretic.   For long term management of her respiratory symptoms, reasonable to pursue weight loss/breast reduction surgery given her co-comitant restrictive defect related to chest wall compliance. She has been referred to Plastics and awaiting evaluation. Counseled to continue bronchodilators and diuretics per Cardiology.   58 year old female never smoker with severe OSA, severe pulmonary hypertension secondary II, III, asthma,  restrictive lung defect  Severe Pulmonary Hypertension Group II, III -Unable to rule out group I (neg autoimmune serologies). Not a transplant candidate due to BMI Chronic hypoxemic respiratory failure NYHA class III symptoms, unchanged Chronic diastolic heart failure, RV dysfunction --CONTINUE supplemental oxygen for goal >90% --Continue PH and diuretic medication per Cardiology  Severe OSA on CPAP  --Counseled on sleep hygiene --Counseled on weight loss/maintenance of healthy weight --Counseled NOT to drive if/when sleepy --Advised patient to wear CPAP for at least 4 hours each night for greater than 70% of the time to avoid the machine being repossessed by insurance. --Will likely need repeat sleep test in the future  Moderately severe asthma Restrictive defect likely related to chronic diastolic heart failure --INCREASE Breo 200 ONE puff daily --Continue nebs PRN --ORDER pulmonary function test  Recurrent DVT Hx of RLE DVT in 05/2020 and 2017. Diagnosed peri-travel however given recurrence, may need to consider lifelong anticoagulation. Cardiology agrees with long-term anticoagulation. --Continue Eliquis   REQUESTING MOTORIZED SCOOTER  Health Maintenance Immunization History  Administered Date(s) Administered  . Fluad Quad(high Dose 65+) 09/20/2020  . Influenza Whole 08/20/2019  . Influenza,inj,Quad PF,6+ Mos 06/25/2018  . Moderna Covid-19 Fall Seasonal Vaccine 14yrs & older 08/16/2023  . PFIZER(Purple Top)SARS-COV-2 Vaccination 02/02/2020, 02/24/2020, 09/27/2020  . Pneumococcal Polysaccharide-23 07/17/2014   CT Lung Screen -never smoker, would not qualify  I have spent a total time of 35-minutes on the day of the appointment reviewing prior documentation, coordinating care and discussing medical diagnosis and plan with the patient/family. Past medical history, allergies, medications were reviewed. Pertinent imaging, labs and tests included in this note have been reviewed  and interpreted independently by me.  Priscilla Finklea Slater Staff, MD [facility] 07/15/2024 9:24 AM  Office Number [phone]

## 2024-07-15 NOTE — Patient Instructions (Addendum)
 Moderately severe asthma Restrictive defect likely related to chronic diastolic heart failure --INCREASE Breo 200 ONE puff daily --Continue nebs PRN --ORDER pulmonary function test  Severe OSA on CPAP  --Counseled on sleep hygiene --Counseled on weight loss/maintenance of healthy weight --Counseled NOT to drive if/when sleepy --Advised patient to wear CPAP for at least 4 hours each night for greater than 70% of the time to avoid the machine being repossessed by insurance. --Will likely need repeat sleep test in the future  Severe Pulmonary Hypertension Group II, III -Unable to rule out group I (neg autoimmune serologies). Not a transplant candidate due to BMI Chronic hypoxemic respiratory failure NYHA class III symptoms, unchanged Chronic diastolic heart failure, RV dysfunction --CONTINUE supplemental oxygen for goal >90% --Continue PH and diuretic medication per Cardiology --Ambulatory O2 in office performed

## 2024-07-16 ENCOUNTER — Ambulatory Visit (HOSPITAL_COMMUNITY)
Admission: RE | Admit: 2024-07-16 | Discharge: 2024-07-16 | Disposition: A | Discharge status: Home / Self Care | Source: Ambulatory Visit | Attending: Cardiology | Admitting: Cardiology

## 2024-07-16 ENCOUNTER — Ambulatory Visit (HOSPITAL_COMMUNITY): Payer: Self-pay | Admitting: Cardiology

## 2024-07-16 ENCOUNTER — Other Ambulatory Visit (HOSPITAL_COMMUNITY): Payer: Self-pay | Admitting: *Deleted

## 2024-07-16 VITALS — BP 124/82 | HR 75 | Wt 220.4 lb

## 2024-07-16 DIAGNOSIS — I5032 Chronic diastolic (congestive) heart failure: Secondary | ICD-10-CM | POA: Diagnosis not present

## 2024-07-16 DIAGNOSIS — I272 Pulmonary hypertension, unspecified: Secondary | ICD-10-CM | POA: Insufficient documentation

## 2024-07-16 DIAGNOSIS — Z7984 Long term (current) use of oral hypoglycemic drugs: Secondary | ICD-10-CM | POA: Diagnosis not present

## 2024-07-16 DIAGNOSIS — R9431 Abnormal electrocardiogram [ECG] [EKG]: Secondary | ICD-10-CM | POA: Insufficient documentation

## 2024-07-16 DIAGNOSIS — Z9981 Dependence on supplemental oxygen: Secondary | ICD-10-CM | POA: Diagnosis not present

## 2024-07-16 DIAGNOSIS — Z7901 Long term (current) use of anticoagulants: Secondary | ICD-10-CM | POA: Diagnosis not present

## 2024-07-16 DIAGNOSIS — Z86718 Personal history of other venous thrombosis and embolism: Secondary | ICD-10-CM | POA: Diagnosis not present

## 2024-07-16 DIAGNOSIS — E669 Obesity, unspecified: Secondary | ICD-10-CM | POA: Diagnosis not present

## 2024-07-16 DIAGNOSIS — Z6841 Body Mass Index (BMI) 40.0 and over, adult: Secondary | ICD-10-CM | POA: Diagnosis not present

## 2024-07-16 DIAGNOSIS — G4733 Obstructive sleep apnea (adult) (pediatric): Secondary | ICD-10-CM | POA: Diagnosis not present

## 2024-07-16 DIAGNOSIS — I110 Hypertensive heart disease with heart failure: Secondary | ICD-10-CM | POA: Diagnosis not present

## 2024-07-16 DIAGNOSIS — Z79899 Other long term (current) drug therapy: Secondary | ICD-10-CM | POA: Insufficient documentation

## 2024-07-16 DIAGNOSIS — R0602 Shortness of breath: Secondary | ICD-10-CM | POA: Diagnosis present

## 2024-07-16 LAB — CBC
HCT: 47.7 % — ABNORMAL HIGH (ref 36.0–46.0)
Hemoglobin: 15.2 g/dL — ABNORMAL HIGH (ref 12.0–15.0)
MCH: 30.2 pg (ref 26.0–34.0)
MCHC: 31.9 g/dL (ref 30.0–36.0)
MCV: 94.6 fL (ref 80.0–100.0)
Platelets: 249 K/uL (ref 150–400)
RBC: 5.04 MIL/uL (ref 3.87–5.11)
RDW: 16.5 % — ABNORMAL HIGH (ref 11.5–15.5)
WBC: 6.5 K/uL (ref 4.0–10.5)
nRBC: 0 % (ref 0.0–0.2)

## 2024-07-16 LAB — BASIC METABOLIC PANEL WITH GFR
Anion gap: 17 — ABNORMAL HIGH (ref 5–15)
BUN: 37 mg/dL — ABNORMAL HIGH (ref 6–20)
CO2: 23 mmol/L (ref 22–32)
Calcium: 9.5 mg/dL (ref 8.9–10.3)
Chloride: 99 mmol/L (ref 98–111)
Creatinine, Ser: 2.36 mg/dL — ABNORMAL HIGH (ref 0.44–1.00)
GFR, Estimated: 23 mL/min — ABNORMAL LOW (ref >60)
Glucose, Bld: 99 mg/dL (ref 70–99)
Potassium: 3.9 mmol/L (ref 3.5–5.1)
Sodium: 139 mmol/L (ref 135–145)

## 2024-07-16 LAB — BRAIN NATRIURETIC PEPTIDE: B Natriuretic Peptide: 72.4 pg/mL (ref 0.0–100.0)

## 2024-07-16 MED ORDER — PANTOPRAZOLE SODIUM 40 MG PO TBEC: 40.0000 mg | DELAYED_RELEASE_TABLET | Freq: Every day | ORAL | 11 refills | Status: DC

## 2024-07-16 NOTE — Patient Instructions (Signed)
 Medication Changes:  START Protonix  40 mg Daily  Lab Work:  Labs done today, your results will be available in MyChart, we will contact you for abnormal readings.   Testing/Procedures:  Heart Catheterization on Fri 9/19, see instructions below  Special Instructions // Education:  Do the following things EVERYDAY: Weigh yourself in the morning before breakfast. Write it down and keep it in a log. Take your medicines as prescribed Eat low salt foods--Limit salt (sodium) to 2000 mg per day.  Stay as active as you can everyday Limit all fluids for the day to less than 2 liters   CATHETERIZATION INSTRUCTIONS  You are scheduled for a Cardiac Catheterization on Friday, September 19 with Dr. Ezra Shuck.  1. Please arrive at the [facility] (Main Entrance A) at [facility]: [address] [place], [place] [address] at 5:30 AM (This time is 2 hour(s) before your procedure to ensure your preparation).   Free valet parking service is available. You will check in at ADMITTING. The support person will be asked to wait in the waiting room.  It is OK to have someone drop you off and come back when you are ready to be discharged.    Special note: Every effort is made to have your procedure done on time. Please understand that emergencies sometimes delay scheduled procedures.  2. Diet: Nothing to eat after midnight.   3. Hydration: On September 19, you may drink approved liquids (see below) until 5:30 am before the procedure with 8 oz of water  as your last intake.   List of approved liquids water , clear juice, clear tea, black coffee, fruit juices, non-citric and without pulp, carbonated beverages, Gatorade, Kool -Aid, plain Jello-O and plain ice popsicles.  4. Labs: DONE TODAY  5. Medication instructions in preparation for your procedure:   THURSDAY 9/18 PM DO NOT TAKE: Eliquis    FRIDAY 9/19 AM DO NOT TAKE: Eliquis , Jardiance  or Spironolactone    On the morning of your  procedure, take any morning medicines NOT listed above.  You may use sips of water .  6. Plan to go home the same day, you will only stay overnight if medically necessary. 7. Bring a current list of your medications and current insurance cards. 8. You MUST have a responsible person to drive you home. 9. Someone MUST be with you the first 24 hours after you arrive home or your discharge will be delayed. 10. Please wear clothes that are easy to get on and off and wear slip-on shoes.  Thank you for allowing us  to care for you!   -- [facility]   Follow-Up in: 1 month   At the [facility], you and your health needs are our priority. We have a designated team specialized in the treatment of Heart Failure. This Care Team includes your primary Heart Failure Specialized Cardiologist (physician), Advanced Practice Providers (APPs- Physician Assistants and Nurse Practitioners), and Pharmacist who all work together to provide you with the care you need, when you need it.   You may see any of the following providers on your designated Care Team at your next follow up:  Dr. Toribio Fuel Dr. Ezra Shuck Dr. Ria Commander Dr. Odis Brownie Greig Mosses, NP Caffie Shed, [place] [facility] [place], [place] Beckey Coe, NP [place] Lee, NP Tinnie Redman, PharmD   Please be sure to bring in all your medications bottles to every appointment.   Need to Contact Us :  If you have any  questions or concerns before your next appointment please send us  a message through [place] or call our office at [phone].    TO LEAVE A MESSAGE FOR THE NURSE SELECT OPTION 2, PLEASE LEAVE A MESSAGE INCLUDING: YOUR NAME DATE OF BIRTH CALL BACK NUMBER REASON FOR CALL**this is important as we prioritize the call backs  YOU WILL RECEIVE A CALL BACK THE SAME DAY AS LONG AS YOU CALL BEFORE 4:00 PM

## 2024-07-16 NOTE — Progress Notes (Signed)
 ID:  Devra Stare, DOB [birth year], MRN [id number]   Provider location: [place] [facility] Type of Visit: Established patient   PCP:  Burney Darice CROME, MD  Cardiologist:  Dr. Rolan  Chief complaint: Shortness of breath   History of Present Illness: Dawn Fowler is a 58 y.o. 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  She is on CPAP at home and 6L home oxygen (says she has been on oxygen for years.).  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  She additionally has significant HTN.  She had been on Opsumit  10 + tadalafil  20 mg daily but ran out of this prior to her 2/21 admission. She also has a history of prior DVT after long train ride, has had 2 negative V/Q scans.  She was hospitalized for COVID-19 at [facility] in 1/21.    She was seen by [facility] after discharge from COVID admission and was noted to be volume overloaded, Lasix  was changed to torsemide  but she was unable to get to pharmacy to pick up. Subsequently, she developed progressive wt gain, nearly 20 lb and increased dyspnea, prompting her to report back to the ED in 2/21, where she was found to be in acute on chronic diastolic HF and readmitted for IV diuresis and AHF consultation. Echo in 2/21 showed LVEF 55-60%, moderate RV dilation/moderate RV dysfunction. RHC 2/21 showed elevated left and right heart filling pressures with severe pulmonary hypertension. PFTs completed showing severe restriction and severely decreased DLCO. High resolution CT was concerning  for small vessel disease, no ILD, and concerning for PAH.  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 She was continued on IV diuretics and had good response,  diuresing 32 lb. She was transitioned to torsemide  40 mg/20 mg. Opsumit  and tadalafil  restarted.   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  She is on Eliquis . RHC in 9/21 showed normal RA pressure and PCWP, moderate-severe pulmonary arterial hypertension.   Echo 5/22 showed EF 55% with mildly enlarged RV with mild RV dysfunction, mildly D-shaped septum, PASP 48 with normal IVC.   In 2/23, she had bilateral breast reduction surgery. She developed significant volume overload and her torsemide  was increased.  She moved to [place]  and had RHC there in 2/25; normal PCWP and RA pressure but PA 87/32 mean 54 with PVR 11 WU and PCWP 15, CI 1.8 (thermo). V/Q scan in 2/25 showed no sign of chronic PEs.   Follow up 2/25, recently moved back to [place] from [place] . Sotatercept initiated given ongoing significantly elevated PA pressures.   Echo 5/25 showed EF 55-60%, D-shaped septum with moderate RV dysfunction, PASP 49 mmHg.   Headaches with sotatercept, stopped it.   Patient returns for followup of CHF and pulmonary hypertension.  She continues on home oxygen 4L [place] and CPAP at night. She is on tirzepatide  now.  Weight is down 3 lbs.  She reports multiple GI complaints including belching, reflux, and regurgitation.  This does seem to have started since beginning on GLP-1 agonist. She is taking Adempas  now and is due to increase it today.  She is short of breath walking short distances,  feels like symptoms have been worse for about a month.  Oxygen level has been lower as well.  No chest pain or lightheadedness. She does not want to do a 6 minute walk today, says she did one recently at pulmonary appt.   ECG (personally reviewed): NSR, PVCs/PACs  Labs (5/25): K 4.3, creatinine 2.04 Labs (6/25): K 4, creatinine 1.84 Labs (7/25): K 3.9, creatinine 1.82, BNP 98, hgb 16.4  6 minute walk (9/21): 168 m 6 minute walk (10/21): 122 m 6 minute walk (3/22): 107 m 6 minute walk (2/25):  229 m 6 minute walk (5/25): 152 m, oxygen sats 90%-86% on 4 L 6 minute walk (7/25): 243 m  PMH: 1. OHS/OSA: Uses home oxygen.  2. CKD stage IV 3. COVID-19 PNA in 12/20 4. Restrictive lung disease: PFTs (2/21) with severe restriction, severely decreased DLCO.  - High resolution CT chest (2/21): no interstitial lung disease, small vessel disease.  - Breast reduction surgery 2/23 5. Gout 6. HTN 7. DVT: Remote, after train ride.  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  8. Pulmonary hypertension: RHC (2/21) with mean RA 14, PA 89/33 mean 54, mean PCWP 34, CI 2.22, PVR 3.87 WU => mixed pulmonary arterial/pulmonary venous hypertension.  - V/Q scan (2/21): No evidence for chronic PE.  - Echo (2/21): EF 55-60%, moderately dilated RV with moderately decreased systolic function.  - HIV and rheumatologic serologies negative.  - RHC (9/21): mean RA 9, PA 68/23 mean 40, mean PCWP 11, PVR 5.6 WU, no step up in oxygen saturation so no evidence for left to right shunting.  - Echo (5/22): EF 55% with mildly enlarged RV with mild RV dysfunction, mildly D-shaped septum, PASP 48 with normal IVC - RHC (2/25): mean RA 7, PA 87/32 mean 54, mean PCWP 15, CI thermo 1.8, PVR 11 WU. - Echo (5/25):  EF 55-60%, D-shaped septum with moderate RV dysfunction, PASP 49 mmHg.  9. Depression  Current Outpatient Medications  Medication Sig Dispense Refill   acetaminophen  (TYLENOL ) 500 MG tablet Take 1,000 mg by mouth every 6 (six) hours as needed for moderate pain or headache.     albuterol  (VENTOLIN  HFA) 108 (90 Base) MCG/ACT inhaler Inhale 2 puffs into the lungs every 6 (six) hours as needed for wheezing or shortness of breath. 8 g 5   allopurinol  (ZYLOPRIM ) 100 MG tablet TAKE 1 TABLET(100 MG) BY MOUTH DAILY 30 tablet 2   amLODipine  (NORVASC ) 5 MG tablet Take 1 tablet (5 mg total) by mouth daily. 90 tablet 3   apixaban  (ELIQUIS ) 5 MG TABS tablet Take 1 tablet (5 mg total) by mouth 2 (two) times daily. 60  tablet 9   Cholecalciferol  (VITAMIN D3) 50 MCG (2000 UT) capsule Take 2,000 Units by mouth in the morning.     colchicine 0.6 MG tablet Take 0.6 mg by mouth daily as needed (gout flare).     diclofenac Sodium (VOLTAREN) 1 % GEL Apply 2 g topically 3 (three) times daily as needed (pain.).     empagliflozin  (JARDIANCE ) 10 MG TABS tablet Take 1 tablet (10 mg total) by mouth daily. 30 tablet 6   famotidine  (PEPCID ) 20 MG tablet Take 20 mg by mouth 2 (two) times daily.     fluticasone  furoate-vilanterol (BREO ELLIPTA ) 200-25 MCG/ACT AEPB Inhale 1 puff into the lungs daily. 60 each 11   gabapentin  (NEURONTIN ) 100 MG capsule Take 100 mg by mouth daily as needed (Pain).     ipratropium-albuterol  (DUONEB)  0.5-2.5 (3) MG/3ML SOLN INHALE THE CONTENTS OF 1 VIAL VIA NEBULIZER EVERY 6 HOURS AS NEEDED FOR WHEEZING OR SHORTNESS OF BREATH 1080 mL 2   losartan  (COZAAR ) 25 MG tablet Take 1 tablet (25 mg total) by mouth daily. 90 tablet 3   macitentan  (OPSUMIT ) 10 MG tablet Take 1 tablet (10 mg total) by mouth daily. 90 tablet 3   OXYGEN Inhale 4-8 L into the lungs continuous.     pantoprazole  (PROTONIX ) 40 MG tablet Take 1 tablet (40 mg total) by mouth daily. 30 tablet 11   Riociguat  (ADEMPAS ) 0.5 MG TABS Take 0.5 mg by mouth in the morning, at noon, and at bedtime.     Selexipag  (UPTRAVI ) 800 MCG TABS Take 1 tablet (800 mcg total) by mouth 2 (two) times daily. 60 tablet 0   sertraline  (ZOLOFT ) 50 MG tablet Take 1 tablet (50 mg total) by mouth 2 (two) times daily. 60 tablet 0   spironolactone  (ALDACTONE ) 25 MG tablet TAKE 1/2 TABLET(12.5 MG) BY MOUTH DAILY 45 tablet 3   tirzepatide  (ZEPBOUND ) 7.5 MG/0.5ML Pen Inject 7.5 mg into the skin once a week. 2 mL 0   torsemide  (DEMADEX ) 20 MG tablet Take 2 tablets (40 mg total) by mouth daily. 60 tablet 6   ADEMPAS  1.5 MG TABS Take 1.5 mg by mouth 3 (three) times daily.     No current facility-administered medications for this encounter.   Allergies  Allergen Reactions    Adhesive [Tape] Hives    Tolerates paper tape   Latex Dermatitis   Succinylcholine Other (See Comments)    Have trouble waking up   Tizanidine Hives    Social History   Socioeconomic History   Marital status: Single    Spouse name: Not on file   Number of children: Not on file   Years of education: Not on file   Highest education level: Not on file  Occupational History   Not on file  Tobacco Use   Smoking status: Never   Smokeless tobacco: Never  Vaping Use   Vaping status: Never Used  Substance and Sexual Activity   Alcohol use: Not Currently   Drug use: Not Currently   Sexual activity: Not Currently    Partners: Male    Birth control/protection: Post-menopausal    Comment: 1st intercourse- 88, partners- more than 5  Other Topics Concern   Not on file  Social History Narrative   Not on file   Social Drivers of Health   Financial Resource Strain: High Risk (07/12/2022)   Overall Financial Resource Strain (CARDIA)    Difficulty of Paying Living Expenses: Very hard  Food Insecurity: Food Insecurity Present (04/11/2022)   Hunger Vital Sign    Worried About Running Out of Food in the Last Year: Sometimes true    Ran Out of Food in the Last Year: Never true  Transportation Needs: No Transportation Needs (11/11/2020)   PRAPARE - Administrator, Civil Service (Medical): No    Lack of Transportation (Non-Medical): No  Physical Activity: Not on file  Stress: Not on file  Social Connections: Not on file  Intimate Partner Violence: Not At Risk (12/20/2023)   Received from [facility]   LH Intimate Violence    Are you afraid or have you ever been threatened by a current partner, family member, or household member?: No    Within the past year, have you ever been hit, slapped, choked, forced into sexual activity or otherwise hurt by a  current partner or family member?: No   Family History  Problem Relation Age of Onset   COPD Father    Cancer Mother         gallbladder and bladder    Hypertension Brother    Breast cancer Other    Diabetes Son    Breast cancer Cousin    Breast cancer Cousin    Pulmonary Hypertension Neg Hx    BP 124/82   Pulse 75   Wt 100 kg (220 lb 6.4 oz)   LMP  (LMP Unknown) Comment: pmb last 2-3 months  SpO2 96% Comment: 5-liters of room oxygen  BMI 43.04 kg/m   Wt Readings from Last 3 Encounters:  07/16/24 100 kg (220 lb 6.4 oz)  07/15/24 100 kg (220 lb 6.4 oz)  05/13/24 101.2 kg (223 lb 3.2 oz)   PHYSICAL EXAM:  General: NAD Neck: No JVD, no thyromegaly or thyroid nodule.  Lungs: Distant BS CV: Nondisplaced PMI.  Heart regular S1/S2, no S3/S4, no murmur.  No peripheral edema.  No carotid bruit.  Normal pedal pulses.  Abdomen: Soft, nontender, no hepatosplenomegaly, no distention.  Skin: Intact without lesions or rashes.  Neurologic: Alert and oriented x 3.  Psych: Normal affect. Extremities: No clubbing or cyanosis.  HEENT: Normal.   ASSESSMENT & PLAN: 1. Chronic diastolic CHF with significant RV dysfunction: Echo in 2/21 with LVEF 55-60%, moderate RV dilation/moderate RV dysfunction.  RHC 2/18 showed elevated left and right heart filling pressures with severe pulmonary hypertension.  Suspect mixed pulmonary venous/pulmonary arterial hypertension in setting of significant LV diastolic dysfunction (PVR 3.87 WU).  Repeat RHC in 9/21 with moderate-severe PAH, normal filling pressures, and PVR 5.6 WU.  No evidence for left=>right shunting.  Echo in 5/22 showed EF 55% with mildly enlarged RV with mild RV dysfunction, mildly D-shaped septum, PASP 48 with normal IVC.  Echo in 5/25 showed EF 55-60%, D-shaped septum with moderate RV dysfunction, PASP 49 mmHg.  NYHA class III symptoms recently, has felt worse though she does not look particularly volume overloaded on exam (I wonder if her GI symptoms are not confouding her presentation).   - With worsening symptoms, I will arrange for RHC to reassess filling pressures and  pulmonary hypertension.  We discussed risks/benefits and she agrees to procedure.  - Continue torsemide  40 mg bid. BMET/BNP today.  - Continue spiro 12.5 mg daily. - Continue Farxiga  10 mg daily.  2. Pulmonary hypertension: Suspect that this is a mixed picture with group 2 and group 3 PH (related to diastolic LV dysfunction and OHS/OSA). However, concerned for component of group 1 PAH as well.  V/Q scan not suggestive of chronic PEs and high resolution CT chest in 2/21 was not suggestive of ILD. HIV and rheumatologic serologies negative.  RHC showed severe mixed pulmonary venous/pulmonary arterial hypertension, repeat RHC after diuresis in 9/21 showed PAH. PFTs completed in 2/21 showed severe restriction, severely decreased DLCO (?restriction due to body habitus).  Echo in 5/25 showed D-shaped septum with moderate RV dysfunction and PASP 49 mmHg.  V/Q scan in 2/25 again showed no evidence for chronic PE, and RHC in 2/25 showed normal RA pressure, severe pulmonary arterial hypertension with PVR 11 WU, and PCWP borderline at 15 with low CI.  She is on Opsumit , riociguat  and Uptravi  800 mcg bid.  She did not tolerate sotatercept due to headache.  Symptoms worse as above.  - Continue to maintain oxygen saturation with home O2 and CPAP. Weight loss also would  be helpful.  She has had a breast reduction surgery which may decrease lung restriction.  - RHC as above.  - Continue Opsumit  10 mg daily. - She is on riociguat  0.5 mg tid, increase to 1 mg tid today and continue to increase to goal 2.5 mg tid as tolerated.   - She is tolerating Uptravi  800 mcg bid now, she has not tolerated uptitration.  3. HTN: BP controlled.  - Continue current losartan  and amlodipine .  4. CKD stage 3.  - Continue SGLT2-i.  - Followup with nephrology.  5. OHS/OSA: Now s/p breast reduction surgery which hopefully will help with OHS. Compliant w/ CPAP and home oxygen, no change. 6. 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   As she is at baseline sedentary and has had recurrent DVTs, would continue Eliquis  long-term.   7. Restrictive lung disease: Now s/p breast reduction surgery.  8. Obesity: Body mass index is 43.04 kg/m. She is on tirzepatide  but has developed a number of GI symptoms that may be side effects (reflux, regurgitation, belching).   - Start Protonix  40 mg daily.  If this does not help symptoms, may have to cut back/stop tirzepatide .   Followup 1 month.   I spent 41 minutes reviewing records, interviewing/examining patient, and managing orders.   Signed, Ezra Shuck, MD  07/16/2024  [facility] [place] [address] [facility] [place] [address] [phone] (office) [phone] (fax)

## 2024-07-16 NOTE — H&P (View-Only) (Signed)
 ID:  Dawn Fowler, DOB [birth year], MRN [id number]   Provider location: [facility] Type of Visit: Established patient   PCP:  Burney Darice CROME, MD  Cardiologist:  Dr. Rolan  Chief complaint: Shortness of breath   History of Present Illness: Dawn Fowler is a 58 y.o. 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  She is on CPAP at home and 6L home oxygen (says she has been on oxygen for years.).  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  She additionally has significant HTN.  She had been on Opsumit  10 + tadalafil  20 mg daily but ran out of this prior to her 2/21 admission. She also has a history of prior DVT after long train ride, has had 2 negative V/Q scans.  She was hospitalized for COVID-19 at [facility] in 1/21.    She was seen by [facility] after discharge from COVID admission and was noted to be volume overloaded, Lasix  was changed to torsemide  but she was unable to get to pharmacy to pick up. Subsequently, she developed progressive wt gain, nearly 20 lb and increased dyspnea, prompting her to report back to the ED in 2/21, where she was found to be in acute on chronic diastolic HF and readmitted for IV diuresis and AHF consultation. Echo in 2/21 showed LVEF 55-60%, moderate RV dilation/moderate RV dysfunction. RHC 2/21 showed elevated left and right heart filling pressures with severe pulmonary hypertension. PFTs completed showing severe restriction and severely decreased DLCO. High resolution CT was concerning  for small vessel disease, no ILD, and concerning for PAH.  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 She was continued on IV diuretics and had good response,  diuresing 32 lb. She was transitioned to torsemide  40 mg/20 mg. Opsumit  and tadalafil  restarted.   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  She is on Eliquis . RHC in 9/21 showed normal RA pressure and PCWP, moderate-severe pulmonary arterial hypertension.   Echo 5/22 showed EF 55% with mildly enlarged RV with mild RV dysfunction, mildly D-shaped septum, PASP 48 with normal IVC.   In 2/23, she had bilateral breast reduction surgery. She developed significant volume overload and her torsemide  was increased.  She moved to [place]  and had RHC there in 2/25; normal PCWP and RA pressure but PA 87/32 mean 54 with PVR 11 WU and PCWP 15, CI 1.8 (thermo). V/Q scan in 2/25 showed no sign of chronic PEs.   Follow up 2/25, recently moved back to [place] from [place] . Sotatercept initiated given ongoing significantly elevated PA pressures.   Echo 5/25 showed EF 55-60%, D-shaped septum with moderate RV dysfunction, PASP 49 mmHg.   Headaches with sotatercept, stopped it.   Patient returns for followup of CHF and pulmonary hypertension.  She continues on home oxygen 4L [place] and CPAP at night. She is on tirzepatide  now.  Weight is down 3 lbs.  She reports multiple GI complaints including belching, reflux, and regurgitation.  This does seem to have started since beginning on GLP-1 agonist. She is taking Adempas  now and is due to increase it today.  She is short of breath walking short distances,  feels like symptoms have been worse for about a month.  Oxygen level has been lower as well.  No chest pain or lightheadedness. She does not want to do a 6 minute walk today, says she did one recently at pulmonary appt.   ECG (personally reviewed): NSR, PVCs/PACs  Labs (5/25): K 4.3, creatinine 2.04 Labs (6/25): K 4, creatinine 1.84 Labs (7/25): K 3.9, creatinine 1.82, BNP 98, hgb 16.4  6 minute walk (9/21): 168 m 6 minute walk (10/21): 122 m 6 minute walk (3/22): 107 m 6 minute walk (2/25):  229 m 6 minute walk (5/25): 152 m, oxygen sats 90%-86% on 4 L 6 minute walk (7/25): 243 m  PMH: 1. OHS/OSA: Uses home oxygen.  2. CKD stage IV 3. COVID-19 PNA in 12/20 4. Restrictive lung disease: PFTs (2/21) with severe restriction, severely decreased DLCO.  - High resolution CT chest (2/21): no interstitial lung disease, small vessel disease.  - Breast reduction surgery 2/23 5. Gout 6. HTN 7. DVT: Remote, after train ride.  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  8. Pulmonary hypertension: RHC (2/21) with mean RA 14, PA 89/33 mean 54, mean PCWP 34, CI 2.22, PVR 3.87 WU => mixed pulmonary arterial/pulmonary venous hypertension.  - V/Q scan (2/21): No evidence for chronic PE.  - Echo (2/21): EF 55-60%, moderately dilated RV with moderately decreased systolic function.  - HIV and rheumatologic serologies negative.  - RHC (9/21): mean RA 9, PA 68/23 mean 40, mean PCWP 11, PVR 5.6 WU, no step up in oxygen saturation so no evidence for left to right shunting.  - Echo (5/22): EF 55% with mildly enlarged RV with mild RV dysfunction, mildly D-shaped septum, PASP 48 with normal IVC - RHC (2/25): mean RA 7, PA 87/32 mean 54, mean PCWP 15, CI thermo 1.8, PVR 11 WU. - Echo (5/25):  EF 55-60%, D-shaped septum with moderate RV dysfunction, PASP 49 mmHg.  9. Depression  Current Outpatient Medications  Medication Sig Dispense Refill   acetaminophen  (TYLENOL ) 500 MG tablet Take 1,000 mg by mouth every 6 (six) hours as needed for moderate pain or headache.     albuterol  (VENTOLIN  HFA) 108 (90 Base) MCG/ACT inhaler Inhale 2 puffs into the lungs every 6 (six) hours as needed for wheezing or shortness of breath. 8 g 5   allopurinol  (ZYLOPRIM ) 100 MG tablet TAKE 1 TABLET(100 MG) BY MOUTH DAILY 30 tablet 2   amLODipine  (NORVASC ) 5 MG tablet Take 1 tablet (5 mg total) by mouth daily. 90 tablet 3   apixaban  (ELIQUIS ) 5 MG TABS tablet Take 1 tablet (5 mg total) by mouth 2 (two) times daily. 60  tablet 9   Cholecalciferol  (VITAMIN D3) 50 MCG (2000 UT) capsule Take 2,000 Units by mouth in the morning.     colchicine 0.6 MG tablet Take 0.6 mg by mouth daily as needed (gout flare).     diclofenac Sodium (VOLTAREN) 1 % GEL Apply 2 g topically 3 (three) times daily as needed (pain.).     empagliflozin  (JARDIANCE ) 10 MG TABS tablet Take 1 tablet (10 mg total) by mouth daily. 30 tablet 6   famotidine  (PEPCID ) 20 MG tablet Take 20 mg by mouth 2 (two) times daily.     fluticasone  furoate-vilanterol (BREO ELLIPTA ) 200-25 MCG/ACT AEPB Inhale 1 puff into the lungs daily. 60 each 11   gabapentin  (NEURONTIN ) 100 MG capsule Take 100 mg by mouth daily as needed (Pain).     ipratropium-albuterol  (DUONEB)  0.5-2.5 (3) MG/3ML SOLN INHALE THE CONTENTS OF 1 VIAL VIA NEBULIZER EVERY 6 HOURS AS NEEDED FOR WHEEZING OR SHORTNESS OF BREATH 1080 mL 2   losartan  (COZAAR ) 25 MG tablet Take 1 tablet (25 mg total) by mouth daily. 90 tablet 3   macitentan  (OPSUMIT ) 10 MG tablet Take 1 tablet (10 mg total) by mouth daily. 90 tablet 3   OXYGEN Inhale 4-8 L into the lungs continuous.     pantoprazole  (PROTONIX ) 40 MG tablet Take 1 tablet (40 mg total) by mouth daily. 30 tablet 11   Riociguat  (ADEMPAS ) 0.5 MG TABS Take 0.5 mg by mouth in the morning, at noon, and at bedtime.     Selexipag  (UPTRAVI ) 800 MCG TABS Take 1 tablet (800 mcg total) by mouth 2 (two) times daily. 60 tablet 0   sertraline  (ZOLOFT ) 50 MG tablet Take 1 tablet (50 mg total) by mouth 2 (two) times daily. 60 tablet 0   spironolactone  (ALDACTONE ) 25 MG tablet TAKE 1/2 TABLET(12.5 MG) BY MOUTH DAILY 45 tablet 3   tirzepatide  (ZEPBOUND ) 7.5 MG/0.5ML Pen Inject 7.5 mg into the skin once a week. 2 mL 0   torsemide  (DEMADEX ) 20 MG tablet Take 2 tablets (40 mg total) by mouth daily. 60 tablet 6   ADEMPAS  1.5 MG TABS Take 1.5 mg by mouth 3 (three) times daily.     No current facility-administered medications for this encounter.   Allergies  Allergen Reactions    Adhesive [Tape] Hives    Tolerates paper tape   Latex Dermatitis   Succinylcholine Other (See Comments)    Have trouble waking up   Tizanidine Hives    Social History   Socioeconomic History   Marital status: Single    Spouse name: Not on file   Number of children: Not on file   Years of education: Not on file   Highest education level: Not on file  Occupational History   Not on file  Tobacco Use   Smoking status: Never   Smokeless tobacco: Never  Vaping Use   Vaping status: Never Used  Substance and Sexual Activity   Alcohol use: Not Currently   Drug use: Not Currently   Sexual activity: Not Currently    Partners: Male    Birth control/protection: Post-menopausal    Comment: 1st intercourse- 88, partners- more than 5  Other Topics Concern   Not on file  Social History Narrative   Not on file   Social Drivers of Health   Financial Resource Strain: High Risk (07/12/2022)   Overall Financial Resource Strain (CARDIA)    Difficulty of Paying Living Expenses: Very hard  Food Insecurity: Food Insecurity Present (04/11/2022)   Hunger Vital Sign    Worried About Running Out of Food in the Last Year: Sometimes true    Ran Out of Food in the Last Year: Never true  Transportation Needs: No Transportation Needs (11/11/2020)   PRAPARE - Administrator, Civil Service (Medical): No    Lack of Transportation (Non-Medical): No  Physical Activity: Not on file  Stress: Not on file  Social Connections: Not on file  Intimate Partner Violence: Not At Risk (12/20/2023)   Received from [facility]   LH Intimate Violence    Are you afraid or have you ever been threatened by a current partner, family member, or household member?: No    Within the past year, have you ever been hit, slapped, choked, forced into sexual activity or otherwise hurt by a  current partner or family member?: No   Family History  Problem Relation Age of Onset   COPD Father    Cancer Mother         gallbladder and bladder    Hypertension Brother    Breast cancer Other    Diabetes Son    Breast cancer Cousin    Breast cancer Cousin    Pulmonary Hypertension Neg Hx    BP 124/82   Pulse 75   Wt 100 kg (220 lb 6.4 oz)   LMP  (LMP Unknown) Comment: pmb last 2-3 months  SpO2 96% Comment: 5-liters of room oxygen  BMI 43.04 kg/m   Wt Readings from Last 3 Encounters:  07/16/24 100 kg (220 lb 6.4 oz)  07/15/24 100 kg (220 lb 6.4 oz)  05/13/24 101.2 kg (223 lb 3.2 oz)   PHYSICAL EXAM:  General: NAD Neck: No JVD, no thyromegaly or thyroid nodule.  Lungs: Distant BS CV: Nondisplaced PMI.  Heart regular S1/S2, no S3/S4, no murmur.  No peripheral edema.  No carotid bruit.  Normal pedal pulses.  Abdomen: Soft, nontender, no hepatosplenomegaly, no distention.  Skin: Intact without lesions or rashes.  Neurologic: Alert and oriented x 3.  Psych: Normal affect. Extremities: No clubbing or cyanosis.  HEENT: Normal.   ASSESSMENT & PLAN: 1. Chronic diastolic CHF with significant RV dysfunction: Echo in 2/21 with LVEF 55-60%, moderate RV dilation/moderate RV dysfunction.  RHC 2/18 showed elevated left and right heart filling pressures with severe pulmonary hypertension.  Suspect mixed pulmonary venous/pulmonary arterial hypertension in setting of significant LV diastolic dysfunction (PVR 3.87 WU).  Repeat RHC in 9/21 with moderate-severe PAH, normal filling pressures, and PVR 5.6 WU.  No evidence for left=>right shunting.  Echo in 5/22 showed EF 55% with mildly enlarged RV with mild RV dysfunction, mildly D-shaped septum, PASP 48 with normal IVC.  Echo in 5/25 showed EF 55-60%, D-shaped septum with moderate RV dysfunction, PASP 49 mmHg.  NYHA class III symptoms recently, has felt worse though she does not look particularly volume overloaded on exam (I wonder if her GI symptoms are not confouding her presentation).   - With worsening symptoms, I will arrange for RHC to reassess filling pressures and  pulmonary hypertension.  We discussed risks/benefits and she agrees to procedure.  - Continue torsemide  40 mg bid. BMET/BNP today.  - Continue spiro 12.5 mg daily. - Continue Farxiga  10 mg daily.  2. Pulmonary hypertension: Suspect that this is a mixed picture with group 2 and group 3 PH (related to diastolic LV dysfunction and OHS/OSA). However, concerned for component of group 1 PAH as well.  V/Q scan not suggestive of chronic PEs and high resolution CT chest in 2/21 was not suggestive of ILD. HIV and rheumatologic serologies negative.  RHC showed severe mixed pulmonary venous/pulmonary arterial hypertension, repeat RHC after diuresis in 9/21 showed PAH. PFTs completed in 2/21 showed severe restriction, severely decreased DLCO (?restriction due to body habitus).  Echo in 5/25 showed D-shaped septum with moderate RV dysfunction and PASP 49 mmHg.  V/Q scan in 2/25 again showed no evidence for chronic PE, and RHC in 2/25 showed normal RA pressure, severe pulmonary arterial hypertension with PVR 11 WU, and PCWP borderline at 15 with low CI.  She is on Opsumit , riociguat  and Uptravi  800 mcg bid.  She did not tolerate sotatercept due to headache.  Symptoms worse as above.  - Continue to maintain oxygen saturation with home O2 and CPAP. Weight loss also would  be helpful.  She has had a breast reduction surgery which may decrease lung restriction.  - RHC as above.  - Continue Opsumit  10 mg daily. - She is on riociguat  0.5 mg tid, increase to 1 mg tid today and continue to increase to goal 2.5 mg tid as tolerated.   - She is tolerating Uptravi  800 mcg bid now, she has not tolerated uptitration.  3. HTN: BP controlled.  - Continue current losartan  and amlodipine .  4. CKD stage 3.  - Continue SGLT2-i.  - Followup with nephrology.  5. OHS/OSA: Now s/p breast reduction surgery which hopefully will help with OHS. Compliant w/ CPAP and home oxygen, no change. 6. 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   As she is at baseline sedentary and has had recurrent DVTs, would continue Eliquis  long-term.   7. Restrictive lung disease: Now s/p breast reduction surgery.  8. Obesity: Body mass index is 43.04 kg/m. She is on tirzepatide  but has developed a number of GI symptoms that may be side effects (reflux, regurgitation, belching).   - Start Protonix  40 mg daily.  If this does not help symptoms, may have to cut back/stop tirzepatide .   Followup 1 month.   I spent 41 minutes reviewing records, interviewing/examining patient, and managing orders.   Signed, Ezra Shuck, MD  07/16/2024  [facility] [address] [facility] [place] [address] [phone] (office) [phone] (fax)

## 2024-07-17 ENCOUNTER — Ambulatory Visit (INDEPENDENT_AMBULATORY_CARE_PROVIDER_SITE_OTHER): Admitting: Pulmonary Disease

## 2024-07-17 DIAGNOSIS — R0602 Shortness of breath: Secondary | ICD-10-CM | POA: Diagnosis not present

## 2024-07-17 LAB — PULMONARY FUNCTION TEST
DL/VA % pred: 56 %
DL/VA: 2.36 ml/min/mmHg/L
DLCO cor % pred: 34 %
DLCO cor: 7.28 ml/min/mmHg
DLCO unc % pred: 36 %
DLCO unc: 7.66 ml/min/mmHg
FEF 25-75 Post: 2.63 L/s
FEF 25-75 Pre: 2.42 L/s
FEF2575-%Change-Post: 8 %
FEF2575-%Pred-Post: 103 %
FEF2575-%Pred-Pre: 95 %
FEV1-%Change-Post: 2 %
FEV1-%Pred-Post: 60 %
FEV1-%Pred-Pre: 59 %
FEV1-Post: 1.65 L
FEV1-Pre: 1.62 L
FEV1FVC-%Change-Post: 0 %
FEV1FVC-%Pred-Pre: 108 %
FEV6-%Change-Post: 5 %
FEV6-%Pred-Post: 57 %
FEV6-%Pred-Pre: 54 %
FEV6-Post: 1.94 L
FEV6-Pre: 1.85 L
FEV6FVC-%Pred-Post: 103 %
FEV6FVC-%Pred-Pre: 103 %
FVC-%Change-Post: 2 %
FVC-%Pred-Post: 55 %
FVC-%Pred-Pre: 54 %
FVC-Post: 1.94 L
FVC-Pre: 1.9 L
Post FEV1/FVC ratio: 85 %
Post FEV6/FVC ratio: 100 %
Pre FEV1/FVC ratio: 85 %
Pre FEV6/FVC Ratio: 100 %
RV % pred: 180 %
RV: 3.57 L
TLC % pred: 103 %
TLC: 5.4 L

## 2024-07-17 NOTE — Patient Instructions (Signed)
 Full PFT performed today.

## 2024-07-17 NOTE — Progress Notes (Signed)
 Full PFT performed today.

## 2024-07-18 ENCOUNTER — Ambulatory Visit (HOSPITAL_COMMUNITY)
Admission: RE | Admit: 2024-07-18 | Discharge: 2024-07-18 | Disposition: A | Discharge status: Home / Self Care | Attending: Cardiology | Admitting: Cardiology

## 2024-07-18 ENCOUNTER — Other Ambulatory Visit (HOSPITAL_COMMUNITY): Payer: Self-pay

## 2024-07-18 ENCOUNTER — Encounter (HOSPITAL_BASED_OUTPATIENT_CLINIC_OR_DEPARTMENT_OTHER): Payer: Self-pay | Admitting: Pulmonary Disease

## 2024-07-18 ENCOUNTER — Encounter (HOSPITAL_COMMUNITY)
Admission: RE | Disposition: A | Discharge status: Home / Self Care | Payer: Self-pay | Source: Home / Self Care | Attending: Cardiology

## 2024-07-18 DIAGNOSIS — J984 Other disorders of lung: Secondary | ICD-10-CM | POA: Diagnosis not present

## 2024-07-18 DIAGNOSIS — Z780 Asymptomatic menopausal state: Secondary | ICD-10-CM | POA: Diagnosis not present

## 2024-07-18 DIAGNOSIS — Z5941 Food insecurity: Secondary | ICD-10-CM | POA: Insufficient documentation

## 2024-07-18 DIAGNOSIS — I272 Pulmonary hypertension, unspecified: Secondary | ICD-10-CM

## 2024-07-18 DIAGNOSIS — Z7984 Long term (current) use of oral hypoglycemic drugs: Secondary | ICD-10-CM | POA: Insufficient documentation

## 2024-07-18 DIAGNOSIS — I270 Primary pulmonary hypertension: Secondary | ICD-10-CM | POA: Diagnosis not present

## 2024-07-18 DIAGNOSIS — E662 Morbid (severe) obesity with alveolar hypoventilation: Secondary | ICD-10-CM | POA: Diagnosis not present

## 2024-07-18 DIAGNOSIS — I130 Hypertensive heart and chronic kidney disease with heart failure and stage 1 through stage 4 chronic kidney disease, or unspecified chronic kidney disease: Secondary | ICD-10-CM | POA: Diagnosis not present

## 2024-07-18 DIAGNOSIS — Z86718 Personal history of other venous thrombosis and embolism: Secondary | ICD-10-CM | POA: Diagnosis not present

## 2024-07-18 DIAGNOSIS — Z9981 Dependence on supplemental oxygen: Secondary | ICD-10-CM | POA: Insufficient documentation

## 2024-07-18 DIAGNOSIS — Z7901 Long term (current) use of anticoagulants: Secondary | ICD-10-CM | POA: Diagnosis not present

## 2024-07-18 DIAGNOSIS — Z6841 Body Mass Index (BMI) 40.0 and over, adult: Secondary | ICD-10-CM | POA: Insufficient documentation

## 2024-07-18 DIAGNOSIS — Z79899 Other long term (current) drug therapy: Secondary | ICD-10-CM | POA: Insufficient documentation

## 2024-07-18 DIAGNOSIS — I509 Heart failure, unspecified: Secondary | ICD-10-CM

## 2024-07-18 DIAGNOSIS — I2721 Secondary pulmonary arterial hypertension: Secondary | ICD-10-CM | POA: Diagnosis not present

## 2024-07-18 DIAGNOSIS — Z5986 Financial insecurity: Secondary | ICD-10-CM | POA: Diagnosis not present

## 2024-07-18 DIAGNOSIS — I5033 Acute on chronic diastolic (congestive) heart failure: Secondary | ICD-10-CM | POA: Diagnosis not present

## 2024-07-18 DIAGNOSIS — N184 Chronic kidney disease, stage 4 (severe): Secondary | ICD-10-CM | POA: Insufficient documentation

## 2024-07-18 HISTORY — PX: RIGHT HEART CATH: CATH118263

## 2024-07-18 LAB — POCT I-STAT EG7
Acid-base deficit: 4 mmol/L — ABNORMAL HIGH (ref 0.0–2.0)
Acid-base deficit: 4 mmol/L — ABNORMAL HIGH (ref 0.0–2.0)
Bicarbonate: 21.8 mmol/L (ref 20.0–28.0)
Bicarbonate: 22.4 mmol/L (ref 20.0–28.0)
Calcium, Ion: 1.28 mmol/L (ref 1.15–1.40)
Calcium, Ion: 1.31 mmol/L (ref 1.15–1.40)
HCT: 42 % (ref 36.0–46.0)
HCT: 42 % (ref 36.0–46.0)
Hemoglobin: 14.3 g/dL (ref 12.0–15.0)
Hemoglobin: 14.3 g/dL (ref 12.0–15.0)
O2 Saturation: 68 %
O2 Saturation: 69 %
Potassium: 3.7 mmol/L (ref 3.5–5.1)
Potassium: 3.8 mmol/L (ref 3.5–5.1)
Sodium: 139 mmol/L (ref 135–145)
Sodium: 139 mmol/L (ref 135–145)
TCO2: 23 mmol/L (ref 22–32)
TCO2: 24 mmol/L (ref 22–32)
pCO2, Ven: 41.6 mmHg — ABNORMAL LOW (ref 44–60)
pCO2, Ven: 42.8 mmHg — ABNORMAL LOW (ref 44–60)
pH, Ven: 7.327 (ref 7.25–7.43)
pH, Ven: 7.327 (ref 7.25–7.43)
pO2, Ven: 38 mmHg (ref 32–45)
pO2, Ven: 38 mmHg (ref 32–45)

## 2024-07-18 LAB — GLUCOSE, CAPILLARY: Glucose-Capillary: 86 mg/dL (ref 70–99)

## 2024-07-18 SURGERY — RIGHT HEART CATH
Anesthesia: LOCAL

## 2024-07-18 MED ORDER — SODIUM CHLORIDE 0.9% FLUSH: 3.0000 mL | INTRAVENOUS | Status: DC | PRN

## 2024-07-18 MED ORDER — HYDRALAZINE HCL 20 MG/ML IJ SOLN
INTRAMUSCULAR | Status: AC
  Filled 2024-07-18: qty 1

## 2024-07-18 MED ORDER — LIDOCAINE HCL (PF) 1 % IJ SOLN
INTRAMUSCULAR | Status: DC | PRN
  Administered 2024-07-18: 5 mL

## 2024-07-18 MED ORDER — SODIUM CHLORIDE 0.9 % IV SOLN: 250.0000 mL | INTRAVENOUS | Status: DC | PRN

## 2024-07-18 MED ORDER — LIDOCAINE HCL (PF) 1 % IJ SOLN
INTRAMUSCULAR | Status: AC
  Filled 2024-07-18: qty 30

## 2024-07-18 MED ORDER — PANTOPRAZOLE SODIUM 40 MG PO TBEC: 40.0000 mg | DELAYED_RELEASE_TABLET | Freq: Every day | ORAL | 11 refills | Status: AC

## 2024-07-18 MED ORDER — SODIUM CHLORIDE 0.9% FLUSH: 3.0000 mL | Freq: Two times a day (BID) | INTRAVENOUS | Status: DC

## 2024-07-18 MED ORDER — FREE WATER
250.0000 mL | Freq: Once | Status: AC
  Administered 2024-07-18: 250 mL via ORAL

## 2024-07-18 MED ORDER — HYDRALAZINE HCL 20 MG/ML IJ SOLN
INTRAMUSCULAR | Status: DC | PRN
  Administered 2024-07-18: 10 mg via INTRAVENOUS

## 2024-07-18 MED ORDER — HEPARIN (PORCINE) IN NACL 1000-0.9 UT/500ML-% IV SOLN
INTRAVENOUS | Status: DC | PRN
  Administered 2024-07-18: 500 mL

## 2024-07-18 SURGICAL SUPPLY — 5 items
CATH BALLN WEDGE 5F 110CM (CATHETERS) IMPLANT
PACK CARDIAC CATHETERIZATION (CUSTOM PROCEDURE TRAY) ×1 IMPLANT
SHEATH GLIDE SLENDER 4/5FR (SHEATH) IMPLANT
TRANSDUCER W/STOPCOCK (MISCELLANEOUS) IMPLANT
TUBING ART PRESS 72 MALE/FEM (TUBING) IMPLANT

## 2024-07-18 NOTE — Discharge Instructions (Addendum)
 1.  Start pantoprazole  40 mg daily (Protonix ).  I will make sure it goes to pharmacy.  2. Increase Adempas  to 1.5 mg three times/day.  3. Restart Eliquis  today.  4. Continue current torsemide  dosing.   Brachial Site Care   This sheet gives you information about how to care for yourself after your procedure. Your health care provider may also give you more specific instructions. If you have problems or questions, contact your health care provider. What can I expect after the procedure? After the procedure, it is common to have: Bruising and tenderness at the catheter insertion area. Follow these instructions at home:  Insertion site care Follow instructions from your health care provider about how to take care of your insertion site. Make sure you: Wash your hands with soap and water  before you change your bandage (dressing). If soap and water  are not available, use hand sanitizer. Remove your dressing as told by your health care provider. In 24 hours Check your insertion site every day for signs of infection. Check for: Redness, swelling, or pain. Pus or a bad smell. Warmth. You may shower 24-48 hours after the procedure. Do not apply powder or lotion to the site.  Activity For 24 hours after the procedure, or as directed by your health care provider: Do not push or pull heavy objects with the affected arm. Do not drive yourself home from the hospital or clinic. You may drive 24 hours after the procedure unless your health care provider tells you not to. Do not lift anything that is heavier than 10 lb (4.5 kg), or the limit that you are told, until your health care provider says that it is safe.  For 24 hours

## 2024-07-18 NOTE — Progress Notes (Signed)
 Discharge instructions reviewed with patient and Wylie Hands at bedside denies questions or concerns. PT was able to tolerate liquids prior to discharge pt was able to ambulate in the hallway. No s/s of complications at incision site. Pt escorted from the unit via wheel chair to personal vehicle.

## 2024-07-18 NOTE — Interval H&P Note (Signed)
 History and Physical Interval Note:  07/18/2024 7:56 AM  Dawn Fowler  has presented today for surgery, with the diagnosis of pulmonary HTN.  The various methods of treatment have been discussed with the patient and family. After consideration of risks, benefits and other options for treatment, the patient has consented to  Procedure(s): RIGHT HEART CATH (N/A) as a surgical intervention.  The patient's history has been reviewed, patient examined, no change in status, stable for surgery.  I have reviewed the patient's chart and labs.  Questions were answered to the patient's satisfaction.     Colisha Redler [employer]

## 2024-07-20 ENCOUNTER — Encounter (HOSPITAL_COMMUNITY): Payer: Self-pay | Admitting: Cardiology

## 2024-07-28 ENCOUNTER — Ambulatory Visit (INDEPENDENT_AMBULATORY_CARE_PROVIDER_SITE_OTHER): Admitting: Pulmonary Disease

## 2024-07-28 ENCOUNTER — Encounter (HOSPITAL_BASED_OUTPATIENT_CLINIC_OR_DEPARTMENT_OTHER): Payer: Self-pay | Admitting: Pulmonary Disease

## 2024-07-28 VITALS — BP 109/64 | HR 92 | Ht 60.0 in

## 2024-07-28 DIAGNOSIS — J9611 Chronic respiratory failure with hypoxia: Secondary | ICD-10-CM | POA: Diagnosis not present

## 2024-07-28 DIAGNOSIS — I2723 Pulmonary hypertension due to lung diseases and hypoxia: Secondary | ICD-10-CM

## 2024-07-28 DIAGNOSIS — J45909 Unspecified asthma, uncomplicated: Secondary | ICD-10-CM

## 2024-07-28 DIAGNOSIS — Z6841 Body Mass Index (BMI) 40.0 and over, adult: Secondary | ICD-10-CM

## 2024-07-28 DIAGNOSIS — G4733 Obstructive sleep apnea (adult) (pediatric): Secondary | ICD-10-CM | POA: Diagnosis not present

## 2024-07-28 DIAGNOSIS — Z86718 Personal history of other venous thrombosis and embolism: Secondary | ICD-10-CM | POA: Diagnosis not present

## 2024-07-28 DIAGNOSIS — I2722 Pulmonary hypertension due to left heart disease: Secondary | ICD-10-CM

## 2024-07-28 DIAGNOSIS — N183 Chronic kidney disease, stage 3 unspecified: Secondary | ICD-10-CM

## 2024-07-28 DIAGNOSIS — R29898 Other symptoms and signs involving the musculoskeletal system: Secondary | ICD-10-CM

## 2024-07-28 DIAGNOSIS — I2729 Other secondary pulmonary hypertension: Secondary | ICD-10-CM

## 2024-07-28 DIAGNOSIS — I5032 Chronic diastolic (congestive) heart failure: Secondary | ICD-10-CM

## 2024-07-28 NOTE — Progress Notes (Signed)
 Subjective:   PATIENT ID: Sharlet Davis GENDER: female DOB: [birth year], MRN: [id number]   Chief Complaint  Patient presents with   Shortness of Breath   Reason for Visit: Follow-up visit  Ms. 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  Synopsis: Previously followed for chronic hypoxemic failure 2/2 PH and severe OSA at the [facility] in [place], [place]. Work-up included RHC 12/2016 RA 19 PA 76/40/48 PCWP 20 CO 4.02L/min CI 1.9, V/Q scan (neg), PFTs 06/2018 (mod restrictive defect), CT Chest (no evidence of ILD). Started on Opsumit  and tadalafil . Moved to [place] in 2020.  She was admitted for acute diastolic heart failure for IV diuretics. She underwent repeat work-up for her PH including RHC 12/2019 which demonstrated elevated right and left heart filling pressure with severe PH, V/Q scan (neg), PFT 12/2019 (severe restriction and reduced DLCO), HRCT (no evidence of ILD) and negative serologies.  She wears 4-8L at baseline. She wears a CPAP however sleep study 07/01/20 demonstrated nocturnal hypoxemia. CPAP has demonstrted bened  08/20/20 Since our last visit, she had a sleep study completed however did not sleep without interruption and felt the experience was overall poor.  She did not feel the sleep study was representative of what she did at home.  Otherwise she reports her dyspnea is at baseline and that she has been compliant with therapies including her home oxygen 4 L at rest and 8 L on exertion.  She continues to wear her CPAP which she finds comfortable and improves her dyspnea and quality of sleep.  When she does not wear her CPAP she notices a difference in her breathing. She has been participating PT and noticed her SpO2 to low 80s on 6L via [place]. She recovers within 2 minutes with rest with sats  to upper 90s. Cardiology has added uptravi  to her current regiment of Opsumit  and tadalfil. She has diarrhea that is controlled lomotil. She has been referred to weight loss clinic but not heard from it. She is considering a breast reduction.  Denies dizziness, chest pain, cough, wheezing or leg swelling.  No recent infections.  She also has questions about Covid and shingles vaccines.  08/02/21 Since our last visit, she has had back issues. She has been referred to Plastics to evaluate for breast reduction surgery as she feels this significantly affects her breathing. She has restarted Breo and is compliant with her oxygen 8L on exertion. Compliant with CPAP which provide clinically benefit with use including improved quality of sleep and energy. She is able to ambulate within the house but has difficulty with upper body activities that cause her to be winded. 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 She is compliant with her Uptravi  and Opsumit . Cardiology started her on Farxiga  and she reports now having a yeast infection that began 2-3 days ago with itching and odor.   07/15/24 She has not been seen in clinic since 08/04/22 with NP Cobb. She had briefly moved to [place]  and saw a pulmonologist there. She reports worsening shortness of breath with activity including household chores that require bending over are difficulty for her. Reports cough that is nonproductive throughout the day and worsens at night. Cough worsened by acid reflux. She is  wearing oxygen 4-8L O2. She has a CPAP but is not established with DME company yet for supplies. Followed by Cardiology for [facility] and on torsemide , spiro and farxiga , opsumit , riociguat  and Uptravi . Not on sotatercept due to headaches. She is scheduled to see Cardiology this week.   07/28/24 Since our last visit she has had progressive shortness of breath. No  wheezing or cough. Worsens with exertion. She had RHC completed. She has a CPAP and needs more supplies. She had received a CPAP device in [place]  last year. Prior sleep test in [place] with nocturnal hypoxemia. Presents for PFT follow-up with family member.  Social history: Social stressors with her son and daughter-in-law has been contributing to her depression Friend planning to move in for support  Past Medical History:  Diagnosis Date   Acute respiratory failure due to COVID-19 (HCC) 10/30/2019   Anxiety    Arthritis    CHF (congestive heart failure) (HCC)    CKD (chronic kidney disease), stage III (HCC)    Complication of anesthesia    hard to wake up after general anesthesia - had trouble waking up after succinylcholine (consider possibility of pseudocholinesterase deficiency)   COVID-19 virus detected 11/20/2019   Tested positive for Covid in December/2020 Required hospitalization in December/2020 through January/2021 Received Redemsivir   Depression    DVT (deep venous thrombosis) (HCC) 06/09/2020   RLE   Dyspnea    Family history of adverse reaction to anesthesia    hard to wake up after general anesthesia   GERD (gastroesophageal reflux disease)    HLD (hyperlipidemia)    HTN (hypertension)    Pre-diabetes    no meds   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; 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     ADEMPAS  1.5 MG TABS Take 1.5 mg by mouth 3 (three) times daily.     albuterol  (VENTOLIN  HFA) 108 (90 Base) MCG/ACT inhaler Inhale 2 puffs into the lungs every 6 (six) hours as needed for wheezing or  shortness of breath. 8 g 5   allopurinol  (ZYLOPRIM ) 100 MG tablet TAKE 1 TABLET(100 MG) BY MOUTH DAILY 30 tablet 2   amLODipine  (NORVASC ) 5 MG tablet Take 1 tablet (5 mg total) by mouth daily. 90 tablet 3   apixaban  (ELIQUIS ) 5 MG TABS tablet Take 1 tablet (5 mg total) by mouth 2 (two) times daily. 60 tablet 9   Cholecalciferol  (VITAMIN D3) 50 MCG (2000 UT) capsule Take 2,000 Units by mouth in the morning.     colchicine 0.6 MG tablet Take 0.6 mg by mouth daily as needed (gout flare).     diclofenac Sodium (VOLTAREN) 1 % GEL Apply 2 g topically 3 (three) times daily as needed (pain.).     empagliflozin  (JARDIANCE ) 10 MG TABS tablet Take 1 tablet (10 mg total) by mouth daily. 30 tablet 6   famotidine  (PEPCID ) 20 MG tablet Take 20 mg by mouth 2 (two) times daily.     fluticasone  furoate-vilanterol (BREO ELLIPTA ) 200-25 MCG/ACT AEPB Inhale 1 puff into the lungs daily. 60 each 11   gabapentin  (NEURONTIN ) 100  MG capsule Take 100 mg by mouth daily as needed (Pain).     ipratropium-albuterol  (DUONEB) 0.5-2.5 (3) MG/3ML SOLN INHALE THE CONTENTS OF 1 VIAL VIA NEBULIZER EVERY 6 HOURS AS NEEDED FOR WHEEZING OR SHORTNESS OF BREATH 1080 mL 2   losartan  (COZAAR ) 25 MG tablet Take 1 tablet (25 mg total) by mouth daily. 90 tablet 3   macitentan  (OPSUMIT ) 10 MG tablet Take 1 tablet (10 mg total) by mouth daily. 90 tablet 3   OXYGEN Inhale 4-8 L into the lungs continuous.     pantoprazole  (PROTONIX ) 40 MG tablet Take 1 tablet (40 mg total) by mouth daily. 30 tablet 11   Selexipag  (UPTRAVI ) 800 MCG TABS Take 1 tablet (800 mcg total) by mouth 2 (two) times daily. 60 tablet 0   sertraline  (ZOLOFT ) 50 MG tablet Take 1 tablet (50 mg total) by mouth 2 (two) times daily. 60 tablet 0   spironolactone  (ALDACTONE ) 25 MG tablet TAKE 1/2 TABLET(12.5 MG) BY MOUTH DAILY 45 tablet 3   tirzepatide  (ZEPBOUND ) 7.5 MG/0.5ML Pen Inject 7.5 mg into the skin once a week. 2 mL 0   torsemide  (DEMADEX ) 20 MG tablet Take 2 tablets (40 mg  total) by mouth daily. 60 tablet 6   Riociguat  (ADEMPAS ) 0.5 MG TABS Take 0.5 mg by mouth in the morning, at noon, and at bedtime. (Patient not taking: Reported on 07/28/2024)     No facility-administered medications prior to visit.    Review of Systems  Constitutional:  Negative for chills, diaphoresis, fever, malaise/fatigue and weight loss.  HENT:  Negative for congestion.   Respiratory:  Positive for shortness of breath. Negative for cough, hemoptysis, sputum production and wheezing.   Cardiovascular:  Negative for chest pain, palpitations and leg swelling.     Objective:   Vitals:   07/28/24 1528  BP: 109/64  Pulse: 92  SpO2: 94%  Height: 5' (1.524 m)  Body mass index is 42.97 kg/m.  SpO2: 94 %  Physical Exam: General: Well-appearing, no acute distress HENT: [place], AT Eyes: EOMI, no scleral icterus Respiratory: Clear to auscultation bilaterally.  No crackles, wheezing or rales Cardiovascular: RRR, -M/R/G, no JVD Extremities:-Edema,-tenderness Neuro: AAO x4, CNII-XII grossly intact Psych: Normal mood, normal affect  Data Reviewed:  Imaging:  12/25/2014-VQ scan-mild obstructive airways disease, very low probability of PE  12/18/2014-CTA anterior-no evidence of PE, cardiomegaly, dependent airspace trapping which may be related to morbid obesity or small airways disease ^Unable to view images but can read radiology report  07/26/18 CT Chest WO-(report only) enlarged MPA and centroal pulmonary arteries consistent with hx of pulmonary hypertension.  Stable predominantly basilar mosaic attenuation reflects either small vessel or small airways disease.  Persistent predominant subcarinal, paraesophageal and bilateral infrahilar lymph node enlargement unchanged from 4 months ago.  Stable small perifissural nodule likely representing intranodular lymph node.  Other concerning parenchymal lesions not seen. -Report does not mention any interstitial lung disease.  05/11/2020- CXR  cardiomegaly. No pulmonary edema or infiltrate  12/21/19 -VQ scan- no evidence of chronic PE  12/21/19 CT Chest - No parenchymal abnormalities. Mosaic attenuation. Enlarged PAH  12/04/23 VQ scan -  1. Heterogeneous perfusion is nonspecific can be seen with pulmonary artery hypertension. There is also enlargement of pulmonary trunk and arteries suggesting pulmonary hypertension. No large segmental wedge-shaped defect suggest acute PE.   PFT: 02/11/2015-office spirometry-FVC 2.14 L, FEV1 1.78, ratio 83  12/22/19  FVC 1.43 (48%) FEV1 1.27 (54%) Ratio 87  TLC 64% DLCO 32% Interpretation: No evidence  of obstructive defect however significant response to bronchodilators suggestive of asthma. Co-comitant severe restrictive lung defect with reduced gas exchange also present.  07/28/24 FVC 1.94 (55%) FEV1 1.65 (60%) Ratio 85  TLC 103% DLCO 36% Interpretation: No obstructive defect present. Reduced FVC and FEV1 with normal TLC is nonspecific. Severely reduced gas exchange. Compared to PFTs in 2021, restrictive defect has normalized. DLCO remains severely low.   Echo 12/21/2014 LV ejection fraction 60 to 65%, moderate pulmonary hypertension, moderate diastolic dysfunction  12/21/19  LV EF 55-60%, moderately dilated RV with decreased systolic function    12/18/19 RHC Procedural Findings: Hemodynamics (mmHg) RA mean 14 RV 87/21 PA 89/33, mean 54 PCWP mean 34 Oxygen saturations: SVC 63% PA 61% AO 94% Cardiac Output (Fick) 5.17  Cardiac Index (Fick) 2.22 PVR 3.87 WU  12/20/23 RHC @ [facility]  Pulmonary Capillary Wedge (PCW) Mean 15 2-12  Pulmonary Artery (PA) 87/32 15-30/6-12  Pulmonary artery mean 54 10-20  Right Ventricle (RV) 85/11 15-30/0-6  Right Atrium (RA) Mean 7 1-5   CONCLUSIONS:  1.  Elevated right ventricular pressure.  2.  Severe pulmonary hypertension with a borderline elevated  pulmonary capillary wedge pressure.  3.  Depressed cardiac index by thermodilution.    07/18/24 RHC @ [facility] Moderate pAH  Assessment & Plan:   Discussion: 58 year old female never smoker with hx OSA, chronic hypoxemic respiratory failure, severe pulmonary HTN secondary WHO Group II, III, asthma, chronic diastolic heart failure, hx recurrent DVT on eliquis , CKD stage III, BMI 42 who presents for follow-up. Previously lost to follow up since 2023 and re-established care 06/2024. Worsening shortness of breath is likely multifactorial with mixed PH, morbid history, asthma and hx OSA. Cardiology optimizing PH meds. Will repeat sleep study and determine if BiPAP needed. For asthma will maximize ICS/LABA strength though suspect her other chronic co-morbidities are driving her dyspnea. Also concerned that deconditioning is playing a role so will refer to pulmonary rehab.  Severe Pulmonary Hypertension Group II, III -Unable to rule out group I (neg autoimmune serologies). V/Q neg chronic PE. CT chest neg for ILD. Not a transplant candidate due to BMI Severely reduced DLCO 2/2 above NYHA class III symptoms, unchanged Chronic diastolic heart failure, RV dysfunction --CONTINUE supplemental oxygen for goal >90% --Continue PH and diuretic medication per Cardiology. Will likely require repeat RHC --Due to difficulty with mobilization due patient requesting mobilized scooter.  >ORDERED mobility scooter. Send message to [facility] to check status --REFER to pulmonary rehab --Goal for 5000 steps a day  Chronic hypoxemic respiratory failure 2/2 PH, possible OSA --Continue 5L continuous O2 with activity and sleep --Currently wears CPAP nightly --Goal SpO2 >90%   History of severe OSA  OSA on CPAP  --07/01/20 Split night study - Nocturnal hypoxemia --Patient continues to use prior CPAP device with self-reported benefit --Counseled on sleep hygiene --Counseled on weight loss/maintenance of healthy weight --Counseled NOT to drive if/when sleepy --ORDER sleep study (split night) to establish  with DME in [place]  Moderately severe 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 Will re-evaluate with PFTs to determine if restrictive defect still present --CONTINUE on increased Breo 200 ONE puff daily --Continue nebs PRN --Continue weight loss efforts with PCP and Zepbound  --Consider repeat CT chest for surveillance in setting of reduced DLCO to r/o parenchymal involvement  Recurrent DVT Hx of RLE DVT in 05/2020 and 2017 --Continue Eliquis    Health  Maintenance Immunization History  Administered Date(s) Administered   Fluad Quad(high Dose 65+) 09/20/2020   Influenza Whole 08/20/2019   Influenza,inj,Quad PF,6+ Mos 06/25/2018   Moderna Covid-19 Fall Seasonal Vaccine 89yrs & older 08/16/2023   PFIZER(Purple Top)SARS-COV-2 Vaccination 02/02/2020, 02/24/2020, 09/27/2020   Pneumococcal Polysaccharide-23 07/17/2014   CT Lung Screen -never smoker, would not qualify  I have spent a total time of 35-minutes on the day of the appointment including chart review, data review, collecting history, coordinating care and discussing medical diagnosis and plan with the patient/family. Past medical history, allergies, medications were reviewed. Pertinent imaging, labs and tests included in this note have been reviewed and interpreted independently by me.  Vernard Gram Slater Staff, MD [facility] 07/28/2024 4:10 PM

## 2024-07-28 NOTE — Patient Instructions (Addendum)
--  ORDERED mobility scooter. Send message to [facility] to check status --REFER to pulmonary rehab --Continue 5L continuous O2 with activity and sleep --Currently wears CPAP nightly --ORDER sleep study (split night) to establish with DME in [place]  >Call office if not contacted in 3 weeks

## 2024-07-29 DIAGNOSIS — N1832 Chronic kidney disease, stage 3b: Secondary | ICD-10-CM | POA: Diagnosis not present

## 2024-08-01 ENCOUNTER — Telehealth (HOSPITAL_COMMUNITY): Payer: Self-pay

## 2024-08-01 NOTE — Telephone Encounter (Signed)
 Called patient to see if she was interested in participating in the Pulmonary Rehab Program. Patient will come in for orientation on 10/24 and will attend the 10:15 exercise class.  Sent MyChart message.

## 2024-08-01 NOTE — Telephone Encounter (Signed)
 Pt insurance is active and benefits verified through [facility]. Co-pay $15, DED $0/$0 met, out of pocket $3,900/$994.40 met, co-insurance 0%. No pre-authorization required. 08/01/2024 @ 1:58pm, spoke with Ronal DASEN., REF# [id number].

## 2024-08-04 DIAGNOSIS — G629 Polyneuropathy, unspecified: Secondary | ICD-10-CM | POA: Diagnosis not present

## 2024-08-04 DIAGNOSIS — J9611 Chronic respiratory failure with hypoxia: Secondary | ICD-10-CM | POA: Diagnosis not present

## 2024-08-04 DIAGNOSIS — N1832 Chronic kidney disease, stage 3b: Secondary | ICD-10-CM | POA: Diagnosis not present

## 2024-08-04 DIAGNOSIS — J4540 Moderate persistent asthma, uncomplicated: Secondary | ICD-10-CM | POA: Diagnosis not present

## 2024-08-04 DIAGNOSIS — I272 Pulmonary hypertension, unspecified: Secondary | ICD-10-CM | POA: Diagnosis not present

## 2024-08-04 DIAGNOSIS — M109 Gout, unspecified: Secondary | ICD-10-CM | POA: Diagnosis not present

## 2024-08-04 DIAGNOSIS — Z86718 Personal history of other venous thrombosis and embolism: Secondary | ICD-10-CM | POA: Diagnosis not present

## 2024-08-08 ENCOUNTER — Telehealth (HOSPITAL_BASED_OUTPATIENT_CLINIC_OR_DEPARTMENT_OTHER): Payer: Self-pay

## 2024-08-08 ENCOUNTER — Telehealth: Payer: Self-pay | Admitting: Cardiology

## 2024-08-08 NOTE — Telephone Encounter (Signed)
 Copied from CRM [id number]. Topic: Clinical - Medical Advice >> Aug 08, 2024  2:22 PM Corean SAUNDERS wrote: Reason for CRM: Patient states he resting oxygen level dropped to 83% yesterday but is not back to normal, patient would like to make Dr. [name] aware of this incase she needs to be seen.  Patient also states that she has not received any call or updates on the referrals sent in by Dr. [name].

## 2024-08-08 NOTE — Telephone Encounter (Signed)
 error

## 2024-08-11 ENCOUNTER — Other Ambulatory Visit (HOSPITAL_COMMUNITY): Payer: Self-pay

## 2024-08-11 ENCOUNTER — Other Ambulatory Visit: Payer: Self-pay | Admitting: Cardiology

## 2024-08-11 NOTE — Progress Notes (Signed)
 ID:  Dawn Fowler, DOB May 13, 1966, MRN [id number]   Provider location: [place] [facility] Type of Visit: Established patient   PCP:  Burney Darice CROME, MD  Cardiologist:  Dr. Rolan  Chief complaint: Shortness of breath   History of Present Illness: Dawn Fowler is a 58 y.o. 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  She is on CPAP at home and 6L home oxygen (says she has been on oxygen for years.).  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  She additionally has significant HTN.  She had been on Opsumit  10 + tadalafil  20 mg daily but ran out of this prior to her 2/21 admission. She also has a history of prior DVT after long train ride, has had 2 negative V/Q scans.  She was hospitalized for COVID-19 at [facility] in 1/21.    She was seen by [facility] after discharge from COVID admission and was noted to be volume overloaded, Lasix  was changed to torsemide  but she was unable to get to pharmacy to pick up. Subsequently, she developed progressive wt gain, nearly 20 lb and increased dyspnea, prompting her to report back to the ED in 2/21, where she was found to be in acute on chronic diastolic HF and readmitted for IV diuresis and AHF consultation. Echo in 2/21 showed LVEF 55-60%, moderate RV dilation/moderate RV dysfunction. RHC 2/21 showed elevated left and right heart filling pressures with severe pulmonary hypertension. PFTs completed showing severe restriction and severely decreased DLCO. High resolution CT was concerning  for small vessel disease, no ILD, and concerning for PAH.  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 She was continued on IV diuretics and had good response,  diuresing 32 lb. She was transitioned to torsemide  40 mg/20 mg. Opsumit  and tadalafil  restarted.   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  She is on Eliquis . RHC in 9/21 showed normal RA pressure and PCWP, moderate-severe pulmonary arterial hypertension.   Echo 5/22 showed EF 55% with mildly enlarged RV with mild RV dysfunction, mildly D-shaped septum, PASP 48 with normal IVC.   In 2/23, she had bilateral breast reduction surgery. She developed significant volume overload and her torsemide  was increased.  She moved to [place]  and had RHC there in 2/25; normal PCWP and RA pressure but PA 87/32 mean 54 with PVR 11 WU and PCWP 15, CI 1.8 (thermo). V/Q scan in 2/25 showed no sign of chronic PEs.   Follow up 2/25, recently moved back to [place] from [place] . Sotatercept initiated given ongoing significantly elevated PA pressures.   Echo 5/25 showed EF 55-60%, D-shaped septum with moderate RV dysfunction, PASP 49 mmHg.   Headaches with sotatercept, stopped it.   Patient returns for followup of CHF and pulmonary hypertension.  She continues on home oxygen 4L [place] and CPAP at night. She is on tirzepatide  now.  Weight is down 3 lbs.  She reports multiple GI complaints including belching, reflux, and regurgitation.  This does seem to have started since beginning on GLP-1 agonist. She is taking Adempas  now and is due to increase it today.  She is short of breath walking short distances,  feels like symptoms have been worse for about a month.  Oxygen level has been lower as well.  No chest pain or lightheadedness. She does not want to do a 6 minute walk today, says she did one recently at pulmonary appt.   ECG (personally reviewed): NSR, PVCs/PACs  Labs (5/25): K 4.3, creatinine 2.04 Labs (6/25): K 4, creatinine 1.84 Labs (7/25): K 3.9, creatinine 1.82, BNP 98, hgb 16.4  6 minute walk (9/21): 168 m 6 minute walk (10/21): 122 m 6 minute walk (3/22): 107 m 6 minute walk (2/25):  229 m 6 minute walk (5/25): 152 m, oxygen sats 90%-86% on 4 L 6 minute walk (7/25): 243 m  PMH: 1. OHS/OSA: Uses home oxygen.  2. CKD stage IV 3. COVID-19 PNA in 12/20 4. Restrictive lung disease: PFTs (2/21) with severe restriction, severely decreased DLCO.  - High resolution CT chest (2/21): no interstitial lung disease, small vessel disease.  - Breast reduction surgery 2/23 5. Gout 6. HTN 7. DVT: Remote, after train ride.  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  8. Pulmonary hypertension: RHC (2/21) with mean RA 14, PA 89/33 mean 54, mean PCWP 34, CI 2.22, PVR 3.87 WU => mixed pulmonary arterial/pulmonary venous hypertension.  - V/Q scan (2/21): No evidence for chronic PE.  - Echo (2/21): EF 55-60%, moderately dilated RV with moderately decreased systolic function.  - HIV and rheumatologic serologies negative.  - RHC (9/21): mean RA 9, PA 68/23 mean 40, mean PCWP 11, PVR 5.6 WU, no step up in oxygen saturation so no evidence for left to right shunting.  - Echo (5/22): EF 55% with mildly enlarged RV with mild RV dysfunction, mildly D-shaped septum, PASP 48 with normal IVC - RHC (2/25): mean RA 7, PA 87/32 mean 54, mean PCWP 15, CI thermo 1.8, PVR 11 WU. - Echo (5/25):  EF 55-60%, D-shaped septum with moderate RV dysfunction, PASP 49 mmHg.  9. Depression  Current Outpatient Medications  Medication Sig Dispense Refill   acetaminophen  (TYLENOL ) 500 MG tablet Take 1,000 mg by mouth every 6 (six) hours as needed for moderate pain or headache.     ADEMPAS  1.5 MG TABS Take 1.5 mg by mouth 3 (three) times daily.     albuterol  (VENTOLIN  HFA) 108 (90 Base) MCG/ACT inhaler Inhale 2 puffs into the lungs every 6 (six) hours as needed for wheezing or shortness of breath. 8 g 5   allopurinol  (ZYLOPRIM ) 100 MG tablet TAKE 1 TABLET(100 MG) BY MOUTH DAILY 30 tablet 2   amLODipine  (NORVASC ) 5 MG tablet Take 1 tablet (5 mg total) by mouth daily. 90 tablet 3   apixaban  (ELIQUIS ) 5 MG  TABS tablet Take 1 tablet (5 mg total) by mouth 2 (two) times daily. 60 tablet 9   Cholecalciferol  (VITAMIN D3) 50 MCG (2000 UT) capsule Take 2,000 Units by mouth in the morning.     colchicine 0.6 MG tablet Take 0.6 mg by mouth daily as needed (gout flare).     diclofenac Sodium (VOLTAREN) 1 % GEL Apply 2 g topically 3 (three) times daily as needed (pain.).     empagliflozin  (JARDIANCE ) 10 MG TABS tablet Take 1 tablet (10 mg total) by mouth daily. 30 tablet 6   famotidine  (PEPCID ) 20 MG tablet Take 20 mg by mouth 2 (two) times daily.     fluticasone  furoate-vilanterol (BREO ELLIPTA ) 200-25 MCG/ACT AEPB Inhale 1 puff into the lungs daily. 60 each 11   gabapentin  (NEURONTIN ) 100  MG capsule Take 100 mg by mouth daily as needed (Pain).     ipratropium-albuterol  (DUONEB) 0.5-2.5 (3) MG/3ML SOLN INHALE THE CONTENTS OF 1 VIAL VIA NEBULIZER EVERY 6 HOURS AS NEEDED FOR WHEEZING OR SHORTNESS OF BREATH 1080 mL 2   losartan  (COZAAR ) 25 MG tablet Take 1 tablet (25 mg total) by mouth daily. 90 tablet 3   macitentan  (OPSUMIT ) 10 MG tablet Take 1 tablet (10 mg total) by mouth daily. 90 tablet 3   OXYGEN Inhale 4-8 L into the lungs continuous.     pantoprazole  (PROTONIX ) 40 MG tablet Take 1 tablet (40 mg total) by mouth daily. 30 tablet 11   Riociguat  (ADEMPAS ) 0.5 MG TABS Take 0.5 mg by mouth in the morning, at noon, and at bedtime. (Patient not taking: Reported on 07/28/2024)     Selexipag  (UPTRAVI ) 800 MCG TABS Take 1 tablet (800 mcg total) by mouth 2 (two) times daily. 60 tablet 0   sertraline  (ZOLOFT ) 50 MG tablet Take 1 tablet (50 mg total) by mouth 2 (two) times daily. 60 tablet 0   spironolactone  (ALDACTONE ) 25 MG tablet TAKE 1/2 TABLET(12.5 MG) BY MOUTH DAILY 45 tablet 3   tirzepatide  (ZEPBOUND ) 7.5 MG/0.5ML Pen Inject 7.5 mg into the skin once a week. 2 mL 0   torsemide  (DEMADEX ) 20 MG tablet Take 2 tablets (40 mg total) by mouth daily. 60 tablet 6   No current facility-administered medications for this  visit.   Allergies  Allergen Reactions   Adhesive [Tape] Hives    Tolerates paper tape   Latex Dermatitis   Succinylcholine Other (See Comments)    Have trouble waking up   Tizanidine Hives    Social History   Socioeconomic History   Marital status: Single    Spouse name: Not on file   Number of children: Not on file   Years of education: Not on file   Highest education level: Not on file  Occupational History   Not on file  Tobacco Use   Smoking status: Never   Smokeless tobacco: Never  Vaping Use   Vaping status: Never Used  Substance and Sexual Activity   Alcohol use: Not Currently   Drug use: Not Currently   Sexual activity: Not Currently    Partners: Male    Birth control/protection: Post-menopausal    Comment: 1st intercourse- 14, partners- more than 5  Other Topics Concern   Not on file  Social History Narrative   Not on file   Social Drivers of Health   Financial Resource Strain: High Risk (07/12/2022)   Overall Financial Resource Strain (CARDIA)    Difficulty of Paying Living Expenses: Very hard  Food Insecurity: Food Insecurity Present (04/11/2022)   Hunger Vital Sign    Worried About Running Out of Food in the Last Year: Sometimes true    Ran Out of Food in the Last Year: Never true  Transportation Needs: No Transportation Needs (11/11/2020)   PRAPARE - Administrator, Civil Service (Medical): No    Lack of Transportation (Non-Medical): No  Physical Activity: Not on file  Stress: Not on file  Social Connections: Not on file  Intimate Partner Violence: Not At Risk (12/20/2023)   Received from [facility]   LH Intimate Violence    Are you afraid or have you ever been threatened by a current partner, family member, or household member?: No    Within the past year, have you ever been hit, slapped, choked, forced into sexual  activity or otherwise hurt by a current partner or family member?: No   Family History  Problem Relation Age of  Onset   COPD Father    Cancer Mother        gallbladder and bladder    Hypertension Brother    Breast cancer Other    Diabetes Son    Breast cancer Cousin    Breast cancer Cousin    Pulmonary Hypertension Neg Hx    LMP  (LMP Unknown) Comment: pmb last 2-3 months  Wt Readings from Last 3 Encounters:  07/18/24 99.8 kg (220 lb)  07/16/24 100 kg (220 lb 6.4 oz)  07/15/24 100 kg (220 lb 6.4 oz)   PHYSICAL EXAM:  General: NAD Neck: No JVD, no thyromegaly or thyroid nodule.  Lungs: Distant BS CV: Nondisplaced PMI.  Heart regular S1/S2, no S3/S4, no murmur.  No peripheral edema.  No carotid bruit.  Normal pedal pulses.  Abdomen: Soft, nontender, no hepatosplenomegaly, no distention.  Skin: Intact without lesions or rashes.  Neurologic: Alert and oriented x 3.  Psych: Normal affect. Extremities: No clubbing or cyanosis.  HEENT: Normal.   ASSESSMENT & PLAN: 1. Chronic diastolic CHF with significant RV dysfunction: Echo in 2/21 with LVEF 55-60%, moderate RV dilation/moderate RV dysfunction.  RHC 2/18 showed elevated left and right heart filling pressures with severe pulmonary hypertension.  Suspect mixed pulmonary venous/pulmonary arterial hypertension in setting of significant LV diastolic dysfunction (PVR 3.87 WU).  Repeat RHC in 9/21 with moderate-severe PAH, normal filling pressures, and PVR 5.6 WU.  No evidence for left=>right shunting.  Echo in 5/22 showed EF 55% with mildly enlarged RV with mild RV dysfunction, mildly D-shaped septum, PASP 48 with normal IVC.  Echo in 5/25 showed EF 55-60%, D-shaped septum with moderate RV dysfunction, PASP 49 mmHg.  NYHA class III symptoms recently, has felt worse though she does not look particularly volume overloaded on exam (I wonder if her GI symptoms are not confouding her presentation).   - With worsening symptoms, I will arrange for RHC to reassess filling pressures and pulmonary hypertension.  We discussed risks/benefits and she agrees to  procedure.  - Continue torsemide  40 mg bid. BMET/BNP today.  - Continue spiro 12.5 mg daily. - Continue Farxiga  10 mg daily.  2. Pulmonary hypertension: Suspect that this is a mixed picture with group 2 and group 3 PH (related to diastolic LV dysfunction and OHS/OSA). However, concerned for component of group 1 PAH as well.  V/Q scan not suggestive of chronic PEs and high resolution CT chest in 2/21 was not suggestive of ILD. HIV and rheumatologic serologies negative.  RHC showed severe mixed pulmonary venous/pulmonary arterial hypertension, repeat RHC after diuresis in 9/21 showed PAH. PFTs completed in 2/21 showed severe restriction, severely decreased DLCO (?restriction due to body habitus).  Echo in 5/25 showed D-shaped septum with moderate RV dysfunction and PASP 49 mmHg.  V/Q scan in 2/25 again showed no evidence for chronic PE, and RHC in 2/25 showed normal RA pressure, severe pulmonary arterial hypertension with PVR 11 WU, and PCWP borderline at 15 with low CI.  She is on Opsumit , riociguat  and Uptravi  800 mcg bid.  She did not tolerate sotatercept due to headache.  Symptoms worse as above.  - Continue to maintain oxygen saturation with home O2 and CPAP. Weight loss also would be helpful.  She has had a breast reduction surgery which may decrease lung restriction.  - RHC as above.  - Continue Opsumit  10 mg  daily. - She is on riociguat  0.5 mg tid, increase to 1 mg tid today and continue to increase to goal 2.5 mg tid as tolerated.   - She is tolerating Uptravi  800 mcg bid now, she has not tolerated uptitration.  3. HTN: BP controlled.  - Continue current losartan  and amlodipine .  4. CKD stage 3.  - Continue SGLT2-i.  - Followup with nephrology.  5. OHS/OSA: Now s/p breast reduction surgery which hopefully will help with OHS. Compliant w/ CPAP and home oxygen, no change. 6. 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  As she is at baseline sedentary and has had recurrent DVTs, would  continue Eliquis  long-term.   7. Restrictive lung disease: Now s/p breast reduction surgery.  8. Obesity: There is no height or weight on file to calculate BMI. She is on tirzepatide  but has developed a number of GI symptoms that may be side effects (reflux, regurgitation, belching).   - Start Protonix  40 mg daily.  If this does not help symptoms, may have to cut back/stop tirzepatide .   Followup 1 month.   I spent 41 minutes reviewing records, interviewing/examining patient, and managing orders.   Signed, Harlene CHRISTELLA Gainer, FNP  08/11/2024  [facility]  [address] [facility] [place] [address] [phone] (office) [phone] (fax)

## 2024-08-12 ENCOUNTER — Telehealth (HOSPITAL_COMMUNITY): Payer: Self-pay

## 2024-08-12 NOTE — Telephone Encounter (Signed)
 Called to confirm/remind patient of their appointment at the [facility] on 08/13/24.   Appointment:   [x] Confirmed  [] Left mess   [] No answer/No voice mail  [] VM Full/unable to leave message  [] Phone not in service  Patient reminded to bring all medications and/or complete list.  Confirmed patient has transportation. Gave directions, instructed to utilize valet parking.

## 2024-08-13 ENCOUNTER — Ambulatory Visit (HOSPITAL_COMMUNITY)
Admission: RE | Admit: 2024-08-13 | Discharge: 2024-08-13 | Disposition: A | Discharge status: Home / Self Care | Source: Ambulatory Visit | Attending: Family Medicine | Admitting: Family Medicine

## 2024-08-13 ENCOUNTER — Encounter (HOSPITAL_COMMUNITY): Payer: Self-pay

## 2024-08-13 ENCOUNTER — Ambulatory Visit (HOSPITAL_COMMUNITY): Payer: Self-pay | Admitting: Family Medicine

## 2024-08-13 VITALS — BP 124/75 | HR 81 | Wt 217.2 lb

## 2024-08-13 DIAGNOSIS — Z9889 Other specified postprocedural states: Secondary | ICD-10-CM | POA: Insufficient documentation

## 2024-08-13 DIAGNOSIS — I272 Pulmonary hypertension, unspecified: Secondary | ICD-10-CM | POA: Insufficient documentation

## 2024-08-13 DIAGNOSIS — Z86718 Personal history of other venous thrombosis and embolism: Secondary | ICD-10-CM | POA: Insufficient documentation

## 2024-08-13 DIAGNOSIS — Z6841 Body Mass Index (BMI) 40.0 and over, adult: Secondary | ICD-10-CM | POA: Diagnosis not present

## 2024-08-13 DIAGNOSIS — J984 Other disorders of lung: Secondary | ICD-10-CM | POA: Diagnosis not present

## 2024-08-13 DIAGNOSIS — E662 Morbid (severe) obesity with alveolar hypoventilation: Secondary | ICD-10-CM | POA: Diagnosis not present

## 2024-08-13 DIAGNOSIS — I130 Hypertensive heart and chronic kidney disease with heart failure and stage 1 through stage 4 chronic kidney disease, or unspecified chronic kidney disease: Secondary | ICD-10-CM | POA: Insufficient documentation

## 2024-08-13 DIAGNOSIS — G4733 Obstructive sleep apnea (adult) (pediatric): Secondary | ICD-10-CM

## 2024-08-13 DIAGNOSIS — I5032 Chronic diastolic (congestive) heart failure: Secondary | ICD-10-CM | POA: Diagnosis not present

## 2024-08-13 DIAGNOSIS — I10 Essential (primary) hypertension: Secondary | ICD-10-CM

## 2024-08-13 DIAGNOSIS — Z7901 Long term (current) use of anticoagulants: Secondary | ICD-10-CM | POA: Diagnosis not present

## 2024-08-13 DIAGNOSIS — Z7984 Long term (current) use of oral hypoglycemic drugs: Secondary | ICD-10-CM | POA: Insufficient documentation

## 2024-08-13 DIAGNOSIS — Z9981 Dependence on supplemental oxygen: Secondary | ICD-10-CM | POA: Insufficient documentation

## 2024-08-13 DIAGNOSIS — N183 Chronic kidney disease, stage 3 unspecified: Secondary | ICD-10-CM | POA: Insufficient documentation

## 2024-08-13 DIAGNOSIS — Z79899 Other long term (current) drug therapy: Secondary | ICD-10-CM | POA: Insufficient documentation

## 2024-08-13 LAB — BASIC METABOLIC PANEL WITH GFR
Anion gap: 11 (ref 5–15)
BUN: 31 mg/dL — ABNORMAL HIGH (ref 6–20)
CO2: 22 mmol/L (ref 22–32)
Calcium: 9.2 mg/dL (ref 8.9–10.3)
Chloride: 105 mmol/L (ref 98–111)
Creatinine, Ser: 2.21 mg/dL — ABNORMAL HIGH (ref 0.44–1.00)
GFR, Estimated: 25 mL/min — ABNORMAL LOW (ref >60)
Glucose, Bld: 92 mg/dL (ref 70–99)
Potassium: 3.8 mmol/L (ref 3.5–5.1)
Sodium: 138 mmol/L (ref 135–145)

## 2024-08-13 LAB — BRAIN NATRIURETIC PEPTIDE: B Natriuretic Peptide: 34.3 pg/mL (ref 0.0–100.0)

## 2024-08-13 MED ORDER — ZEPBOUND 7.5 MG/0.5ML ~~LOC~~ SOAJ: 7.5000 mg | SUBCUTANEOUS | 0 refills | Status: DC

## 2024-08-13 NOTE — Telephone Encounter (Signed)
 Spoke with patient to offer appointment per Dr Laymond request. Patient stated she was feeling fine and did not wish to be seen. Advised her to call us  if that changed.

## 2024-08-13 NOTE — Patient Instructions (Signed)
 Medication Changes:  No Changes In Medications at this time.   Lab Work:  Labs done today, your results will be available in MyChart, we will contact you for abnormal readings.  Follow-Up in: 3 MONTHS WITH DR. ROLAN PLEASE CALL OUR OFFICE AROUND NOVEMBER TO GET SCHEDULED FOR YOUR APPOINTMENT. PHONE NUMBER IS [phone] OPTION 2 R  At the [facility], you and your health needs are our priority. We have a designated team specialized in the treatment of Heart Failure. This Care Team includes your primary Heart Failure Specialized Cardiologist (physician), Advanced Practice Providers (APPs- Physician Assistants and Nurse Practitioners), and Pharmacist who all work together to provide you with the care you need, when you need it.   You may see any of the following providers on your designated Care Team at your next follow up:  Dr. Toribio Fuel Dr. Ezra ROLAN Dr. Ria Commander Dr. Odis Brownie Greig Mosses, NP [place]affie Shed, [place] [facility] [place], [place] Beckey Coe, NP [place] Lee, NP Tinnie Redman, PharmD   Please be sure to bring in all your medications bottles to every appointment.   Need to Contact Us :  If you have any questions or concerns before your next appointment please send us  a message through [place] or call our office at [phone].    TO LEAVE A MESSAGE FOR THE NURSE SELECT OPTION 2, PLEASE LEAVE A MESSAGE INCLUDING: YOUR NAME DATE OF BIRTH CALL BACK NUMBER REASON FOR CALL**this is important as we prioritize the call backs  YOU WILL RECEIVE A CALL BACK THE SAME DAY AS LONG AS YOU CALL BEFORE 4:00 PM

## 2024-08-13 NOTE — Telephone Encounter (Signed)
 Referrals are still being processed with [facility] team

## 2024-08-13 NOTE — Progress Notes (Signed)
 6 Min Walk Test Completed  Pt ambulated 550 ft (167.64 m) O2 Sat ranged 80-88 on 6 L oxygen HR ranged 111-117

## 2024-08-21 ENCOUNTER — Telehealth (HOSPITAL_COMMUNITY): Payer: Self-pay

## 2024-08-21 NOTE — Telephone Encounter (Signed)
 Called to confirm appt. Pt confirmed appt. Instructed pt on proper footwear. Gave directions along with department number.

## 2024-08-22 ENCOUNTER — Encounter (HOSPITAL_COMMUNITY): Payer: Self-pay

## 2024-08-22 ENCOUNTER — Encounter (HOSPITAL_COMMUNITY)
Admission: RE | Admit: 2024-08-22 | Discharge: 2024-08-22 | Disposition: A | Discharge status: Home / Self Care | Source: Ambulatory Visit | Attending: Pulmonary Disease | Admitting: Pulmonary Disease

## 2024-08-22 VITALS — BP 134/88 | HR 87 | Wt 221.6 lb

## 2024-08-22 DIAGNOSIS — I2729 Other secondary pulmonary hypertension: Secondary | ICD-10-CM | POA: Diagnosis present

## 2024-08-22 NOTE — Progress Notes (Signed)
 Dawn Fowler 58 y.o. female Pulmonary Rehab Orientation Note This patient who was referred to Pulmonary Rehab by Dr. [name] with the diagnosis of PAH arrived today in Cardiac and Pulmonary Rehab. She  arrived ambulatory with assistive device with limp gait. She  does carry portable oxygen. [employer] is the provider for their DME. Per patient, Dawn Fowler uses oxygen continuously. Color good, skin warm and dry. Patient is oriented to time and place. Patient's medical history, psychosocial health, and medications reviewed. Psychosocial assessment reveals patient lives with family. Dawn Fowler is currently unemployed, disabled. Patient hobbies include crafting. Patient reports her stress level is moderate. Areas of stress/anxiety include health and finances. Patient does exhibit signs of depression. Signs of depression include hopelessness and sadness and fatigue. PHQ2/9 score 2/9. Dawn Fowler shows good  coping skills with positive outlook on life. Offered emotional support and reassurance. Will continue to monitor. Physical assessment performed by Nurse pick: Dawn Levin RN. Please see their orientation physical assessment note. Dawn Fowler reports she does take medications as prescribed. Patient states she follows a regular  diet. The patient reports no specific efforts to gain or lose weight.. Patient's weight will be monitored closely. Demonstration and practice of PLB using pulse oximeter. Dawn Fowler able to return demonstration satisfactorily. Safety and hand hygiene in the exercise area reviewed with patient. Dawn Fowler voices understanding of the information reviewed. Department expectations discussed with patient and achievable goals were set. The patient shows enthusiasm about attending the program and we look forward to working with Dawn Fowler. Dawn Fowler completed a 6 min walk test today and is scheduled to begin exercise on 08/26/24.   [id number] Dawn Fowler, BSRT

## 2024-08-22 NOTE — Progress Notes (Signed)
   08/22/24 1214  6 Minute Walk  Phase Initial  Distance 421 feet  Walk Time 6 minutes  # of Rest Breaks 2 (1:49-2:57; 4:24-5:20)  MPH 0.8  METS 1.45  RPE 11  Perceived Dyspnea  1  VO2 Peak 5.09  Symptoms Yes (comment)  Comments right knee pain  Resting HR 82 bpm  Resting BP 134/88  Resting Oxygen Saturation  91 %  Exercise Oxygen Saturation  during 6 min walk 85 %  Max Ex. HR 121 bpm  Max Ex. BP 136/80  2 Minute Post BP 124/80  Interval HR  1 Minute HR 112  2 Minute HR 114  3 Minute HR 103  4 Minute HR 121  5 Minute HR 99  6 Minute HR 113  2 Minute Post HR 88  Interval Heart Rate? Yes  Interval Oxygen  Interval Oxygen? Yes  Baseline Oxygen Saturation % 91 %  1 Minute Oxygen Saturation % 93 %  1 Minute Liters of Oxygen 6 L  2 Minute Oxygen Saturation % 85 %  2 Minute Liters of Oxygen 6 L (increased to 8L)  3 Minute Oxygen Saturation % 93 %  3 Minute Liters of Oxygen 8 L  4 Minute Oxygen Saturation % 86 %  4 Minute Liters of Oxygen 8 L (increased to 10L)  5 Minute Oxygen Saturation % 88 %  5 Minute Liters of Oxygen 10 L  6 Minute Oxygen Saturation % 92 %  6 Minute Liters of Oxygen 10 L  2 Minute Post Oxygen Saturation % 97 %  2 Minute Post Liters of Oxygen 10 L

## 2024-08-22 NOTE — Progress Notes (Signed)
 Pulmonary Individual Treatment Plan  Patient Details  Name: Dawn Fowler MRN: [id number] Date of Birth: [birth year] Referring Provider:   Conrad Ports Pulmonary Rehab Walk Test from 08/22/2024 in [facility]  Referring Provider [name]    Initial Encounter Date:  Flowsheet Row Pulmonary Rehab Walk Test from 08/22/2024 in [facility]  Date 08/22/24    Visit Diagnosis: Other secondary pulmonary hypertension (HCC)  Patient's Home Medications on Admission:   Current Outpatient Medications:    acetaminophen  (TYLENOL ) 500 MG tablet, Take 1,000 mg by mouth every 6 (six) hours as needed for moderate pain or headache., Disp: , Rfl:    ADEMPAS  1.5 MG TABS, Take 1.5 mg by mouth 3 (three) times daily. (Patient taking differently: Take 2 mg by mouth 3 (three) times daily.), Disp: , Rfl:    albuterol  (VENTOLIN  HFA) 108 (90 Base) MCG/ACT inhaler, Inhale 2 puffs into the lungs every 6 (six) hours as needed for wheezing or shortness of breath., Disp: 8 g, Rfl: 5   allopurinol  (ZYLOPRIM ) 100 MG tablet, TAKE 1 TABLET(100 MG) BY MOUTH DAILY, Disp: 30 tablet, Rfl: 2   amLODipine  (NORVASC ) 5 MG tablet, Take 1 tablet (5 mg total) by mouth daily., Disp: 90 tablet, Rfl: 3   apixaban  (ELIQUIS ) 5 MG TABS tablet, Take 1 tablet (5 mg total) by mouth 2 (two) times daily., Disp: 60 tablet, Rfl: 9   Cholecalciferol  (VITAMIN D3) 50 MCG (2000 UT) capsule, Take 2,000 Units by mouth in the morning., Disp: , Rfl:    colchicine 0.6 MG tablet, Take 0.6 mg by mouth daily as needed (gout flare)., Disp: , Rfl:    diclofenac Sodium (VOLTAREN) 1 % GEL, Apply 2 g topically 3 (three) times daily as needed (pain.)., Disp: , Rfl:    empagliflozin  (JARDIANCE ) 10 MG TABS tablet, Take 1 tablet (10 mg total) by mouth daily., Disp: 30 tablet, Rfl: 6   famotidine  (PEPCID ) 20 MG tablet, Take 20 mg by mouth 2 (two) times  daily., Disp: , Rfl:    fluticasone  furoate-vilanterol (BREO ELLIPTA ) 200-25 MCG/ACT AEPB, Inhale 1 puff into the lungs daily., Disp: 60 each, Rfl: 11   gabapentin  (NEURONTIN ) 100 MG capsule, Take 100 mg by mouth daily as needed (Pain)., Disp: , Rfl:    ipratropium-albuterol  (DUONEB) 0.5-2.5 (3) MG/3ML SOLN, INHALE THE CONTENTS OF 1 VIAL VIA NEBULIZER EVERY 6 HOURS AS NEEDED FOR WHEEZING OR SHORTNESS OF BREATH, Disp: 1080 mL, Rfl: 2   losartan  (COZAAR ) 25 MG tablet, Take 1 tablet (25 mg total) by mouth daily., Disp: 90 tablet, Rfl: 3   macitentan  (OPSUMIT ) 10 MG tablet, Take 1 tablet (10 mg total) by mouth daily., Disp: 90 tablet, Rfl: 3   OXYGEN, Inhale 4-8 L into the lungs continuous., Disp: , Rfl:    pantoprazole  (PROTONIX ) 40 MG tablet, Take 1 tablet (40 mg total) by mouth daily., Disp: 30 tablet, Rfl: 11   Riociguat  (ADEMPAS ) 2 MG TABS, Take 1 mg by mouth in the morning, at noon, and at bedtime., Disp: , Rfl:    Selexipag  (UPTRAVI ) 800 MCG TABS, Take 1 tablet (800 mcg total) by mouth 2 (two) times daily., Disp: 60 tablet, Rfl: 0   sertraline  (ZOLOFT ) 50 MG tablet, Take 1 tablet (50 mg total) by mouth 2 (two) times daily., Disp: 60 tablet, Rfl: 0   spironolactone  (ALDACTONE ) 25 MG tablet, TAKE 1/2 TABLET(12.5 MG) BY MOUTH DAILY, Disp: 45 tablet,  Rfl: 3   tirzepatide  (ZEPBOUND ) 7.5 MG/0.5ML Pen, Inject 7.5 mg into the skin once a week., Disp: 2 mL, Rfl: 0   torsemide  (DEMADEX ) 20 MG tablet, Take 2 tablets (40 mg total) by mouth daily., Disp: 60 tablet, Rfl: 6   Riociguat  (ADEMPAS ) 0.5 MG TABS, Take 0.5 mg by mouth in the morning, at noon, and at bedtime. (Patient not taking: Reported on 08/22/2024), Disp: , Rfl:   Past Medical History: Past Medical History:  Diagnosis Date   Acute respiratory failure due to COVID-19 (HCC) 10/30/2019   Anxiety    Arthritis    CHF (congestive heart failure) (HCC)    CKD (chronic kidney disease), stage III (HCC)    Complication of anesthesia    hard to wake  up after general anesthesia - had trouble waking up after succinylcholine (consider possibility of pseudocholinesterase deficiency)   COVID-19 virus detected 11/20/2019   Tested positive for Covid in December/2020 Required hospitalization in December/2020 through January/2021 Received Redemsivir   Depression    DVT (deep venous thrombosis) (HCC) 06/09/2020   RLE   Dyspnea    Family history of adverse reaction to anesthesia    hard to wake up after general anesthesia   GERD (gastroesophageal reflux disease)    HLD (hyperlipidemia)    HTN (hypertension)    Pre-diabetes    no meds   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; 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, although unsure if she had confirmation testing done   Pulmonary HTN (HCC)    Sleep apnea    on CPAP   Trichomonas infection     Tobacco Use: Social History   Tobacco Use  Smoking Status Never  Smokeless Tobacco Never    Labs: Review Flowsheet  More data may exist      Latest Ref Rng & Units 10/29/2019 12/18/2019 07/12/2020 03/18/2024 07/18/2024  Labs for ITP Cardiac and Pulmonary Rehab  Trlycerides <150 mg/dL 862  - - - -  Hemoglobin A1c 4.8 - 5.6 % - - - 5.3  -  Bicarbonate 20.0 - 28.0 mmol/L 20.0 - 28.0 mmol/L - 23.8  24.8  26.9  25.6  26.4  25.6  28.2  26.7  25.1  26.4  - 21.8  22.4   TCO2 22 - 32 mmol/L 22 - 32 mmol/L - 25  26  28  27  28  27  30  28  26  28  24   - 23  24   Acid-base deficit 0.0 - 2.0 mmol/L 0.0 - 2.0 mmol/L - 3.0  2.0  1.0  - 4.0  4.0   O2 Saturation % % - 61.0  63.0  65.0  65.0  66.0  66.0  70.0  61.0  75.0  72.0  - 69  68     Details       Multiple values from one day are sorted in reverse-chronological order         Capillary Blood Glucose: Lab Results  Component Value Date   GLUCAP 86 07/18/2024   GLUCAP 140 (H) 12/21/2021     Pulmonary Assessment Scores:  Pulmonary Assessment Scores     Row Name 08/22/24 1220          ADL UCSD   ADL Phase Entry     SOB Score total 62       CAT Score   CAT Score 20       mMRC Score  mMRC Score 4       UCSD: Self-administered rating of dyspnea associated with activities of daily living (ADLs) 6-point scale (0 = not at all to 5 = maximal or unable to do because of breathlessness)  Scoring Scores range from 0 to 120.  Minimally important difference is 5 units  CAT: CAT can identify the health impairment of COPD patients and is better correlated with disease progression.  CAT has a scoring range of zero to 40. The CAT score is classified into four groups of low (less than 10), medium (10 - 20), high (21-30) and very high (31-40) based on the impact level of disease on health status. A CAT score over 10 suggests significant symptoms.  A worsening CAT score could be explained by an exacerbation, poor medication adherence, poor inhaler technique, or progression of COPD or comorbid conditions.  CAT MCID is 2 points  mMRC: mMRC (Modified Medical Research Council) Dyspnea Scale is used to assess the degree of baseline functional disability in patients of respiratory disease due to dyspnea. No minimal important difference is established. A decrease in score of 1 point or greater is considered a positive change.   Pulmonary Function Assessment:  Pulmonary Function Assessment - 08/22/24 1116       Breath   Bilateral Breath Sounds Decreased;Clear    Shortness of Breath Yes;Fear of Shortness of Breath;Limiting activity;Panic with Shortness of Breath          Exercise Target Goals: Exercise Program Goal: Individual exercise prescription set using results from initial 6 min walk test and THRR while considering  patient's activity barriers and safety.   Exercise Prescription Goal: Initial exercise prescription builds to 30-45 minutes a day of aerobic activity, 2-3 days per week.  Home exercise guidelines will be given to patient during program as part of exercise  prescription that the participant will acknowledge.  Activity Barriers & Risk Stratification:  Activity Barriers & Cardiac Risk Stratification - 08/22/24 1111       Activity Barriers & Cardiac Risk Stratification   Activity Barriers Deconditioning;Muscular Weakness;Shortness of Breath;Arthritis;History of Falls    Cardiac Risk Stratification Moderate          6 Minute Walk:  6 Minute Walk     Row Name 08/22/24 1214         6 Minute Walk   Phase Initial     Distance 421 feet     Walk Time 6 minutes     # of Rest Breaks 2  1:49-2:57; 4:24-5:20     MPH 0.8     METS 1.45     RPE 11     Perceived Dyspnea  1     VO2 Peak 5.09     Symptoms Yes (comment)     Comments right knee pain     Resting HR 82 bpm     Resting BP 134/88     Resting Oxygen Saturation  91 %     Exercise Oxygen Saturation  during 6 min walk 85 %     Max Ex. HR 121 bpm     Max Ex. BP 136/80     2 Minute Post BP 124/80       Interval HR   1 Minute HR 112     2 Minute HR 114     3 Minute HR 103     4 Minute HR 121     5 Minute HR 99     6 Minute HR 113  2 Minute Post HR 88     Interval Heart Rate? Yes       Interval Oxygen   Interval Oxygen? Yes     Baseline Oxygen Saturation % 91 %     1 Minute Oxygen Saturation % 93 %     1 Minute Liters of Oxygen 6 L     2 Minute Oxygen Saturation % 85 %     2 Minute Liters of Oxygen 6 L  increased to 8L     3 Minute Oxygen Saturation % 93 %     3 Minute Liters of Oxygen 8 L     4 Minute Oxygen Saturation % 86 %     4 Minute Liters of Oxygen 8 L  increased to 10L     5 Minute Oxygen Saturation % 88 %     5 Minute Liters of Oxygen 10 L     6 Minute Oxygen Saturation % 92 %     6 Minute Liters of Oxygen 10 L     2 Minute Post Oxygen Saturation % 97 %     2 Minute Post Liters of Oxygen 10 L        Oxygen Initial Assessment:  Oxygen Initial Assessment - 08/22/24 1114       Home Oxygen   Home Oxygen Device Home Concentrator;E-Tanks    Sleep Oxygen  Prescription Continuous;CPAP    Liters per minute 4    Home Exercise Oxygen Prescription Continuous    Liters per minute 6    Home Resting Oxygen Prescription Continuous    Liters per minute 4    Compliance with Home Oxygen Use Yes      Initial 6 min Walk   Oxygen Used Continuous    Liters per minute 10      Program Oxygen Prescription   Program Oxygen Prescription Continuous    Liters per minute 10      Intervention   Short Term Goals To learn and exhibit compliance with exercise, home and travel O2 prescription;To learn and understand importance of maintaining oxygen saturations>88%;To learn and demonstrate proper use of respiratory medications;To learn and understand importance of monitoring SPO2 with pulse oximeter and demonstrate accurate use of the pulse oximeter.;To learn and demonstrate proper pursed lip breathing techniques or other breathing techniques.     Long  Term Goals Exhibits compliance with exercise, home  and travel O2 prescription;Maintenance of O2 saturations>88%;Compliance with respiratory medication;Verbalizes importance of monitoring SPO2 with pulse oximeter and return demonstration;Exhibits proper breathing techniques, such as pursed lip breathing or other method taught during program session;Demonstrates proper use of MDI's          Oxygen Re-Evaluation:   Oxygen Discharge (Final Oxygen Re-Evaluation):   Initial Exercise Prescription:  Initial Exercise Prescription - 08/22/24 1100       Date of Initial Exercise RX and Referring Provider   Date 08/22/24    Referring Provider [name]    Expected Discharge Date 11/27/24      Oxygen   Oxygen Continuous    Liters 10    Maintain Oxygen Saturation 88% or higher      Recumbant Bike   Level 1    RPM 50    Watts 80    Minutes 15    METs 1.8      NuStep   Level 1    SPM 60    Minutes 15    METs 2      Prescription Details  Frequency (times per week) 2    Duration Progress to 30 minutes of  continuous aerobic without signs/symptoms of physical distress      Intensity   THRR 40-80% of Max Heartrate 65-130    Ratings of Perceived Exertion 11-13    Perceived Dyspnea 0-4      Progression   Progression Continue progressive overload as per policy without signs/symptoms or physical distress.      Resistance Training   Training Prescription Yes    Weight red bands    Reps 10-15          Perform Capillary Blood Glucose checks as needed.  Exercise Prescription Changes:   Exercise Comments:   Exercise Goals and Review:   Exercise Goals     Row Name 08/22/24 1041             Exercise Goals   Increase Physical Activity Yes       Intervention Provide advice, education, support and counseling about physical activity/exercise needs.;Develop an individualized exercise prescription for aerobic and resistive training based on initial evaluation findings, risk stratification, comorbidities and participant's personal goals.       Expected Outcomes Short Term: Attend rehab on a regular basis to increase amount of physical activity.;Long Term: Exercising regularly at least 3-5 days a week.;Long Term: Add in home exercise to make exercise part of routine and to increase amount of physical activity.       Increase Strength and Stamina Yes       Intervention Provide advice, education, support and counseling about physical activity/exercise needs.;Develop an individualized exercise prescription for aerobic and resistive training based on initial evaluation findings, risk stratification, comorbidities and participant's personal goals.       Expected Outcomes Short Term: Increase workloads from initial exercise prescription for resistance, speed, and METs.;Short Term: Perform resistance training exercises routinely during rehab and add in resistance training at home;Long Term: Improve cardiorespiratory fitness, muscular endurance and strength as measured by increased METs and functional  capacity ( )       Able to understand and use rate of perceived exertion (RPE) scale Yes       Intervention Provide education and explanation on how to use RPE scale       Expected Outcomes Short Term: Able to use RPE daily in rehab to express subjective intensity level;Long Term:  Able to use RPE to guide intensity level when exercising independently       Able to understand and use Dyspnea scale Yes       Intervention Provide education and explanation on how to use Dyspnea scale       Expected Outcomes Short Term: Able to use Dyspnea scale daily in rehab to express subjective sense of shortness of breath during exertion;Long Term: Able to use Dyspnea scale to guide intensity level when exercising independently       Knowledge and understanding of Target Heart Rate Range (THRR) Yes       Intervention Provide education and explanation of THRR including how the numbers were predicted and where they are located for reference       Expected Outcomes Short Term: Able to state/look up THRR;Long Term: Able to use THRR to govern intensity when exercising independently;Short Term: Able to use daily as guideline for intensity in rehab       Understanding of Exercise Prescription Yes       Intervention Provide education, explanation, and written materials on patient's individual exercise prescription  Expected Outcomes Short Term: Able to explain program exercise prescription;Long Term: Able to explain home exercise prescription to exercise independently          Exercise Goals Re-Evaluation :   Discharge Exercise Prescription (Final Exercise Prescription Changes):   Nutrition:  Target Goals: Understanding of nutrition guidelines, daily intake of sodium 1500mg , cholesterol 200mg , calories 30% from fat and 7% or less from saturated fats, daily to have 5 or more servings of fruits and vegetables.  Biometrics:  Pre Biometrics - 08/22/24 1211       Pre Biometrics   Grip Strength 8 kg            Nutrition Therapy Plan and Nutrition Goals:   Nutrition Assessments:  MEDIFICTS Score Key: >=70 Need to make dietary changes  40-70 Heart Healthy Diet <= 40 Therapeutic Level Cholesterol Diet   Picture Your Plate Scores: <59 Unhealthy dietary pattern with much room for improvement. 41-50 Dietary pattern unlikely to meet recommendations for good health and room for improvement. 51-60 More healthful dietary pattern, with some room for improvement.  >60 Healthy dietary pattern, although there may be some specific behaviors that could be improved.    Nutrition Goals Re-Evaluation:   Nutrition Goals Discharge (Final Nutrition Goals Re-Evaluation):   Psychosocial: Target Goals: Acknowledge presence or absence of significant depression and/or stress, maximize coping skills, provide positive support system. Participant is able to verbalize types and ability to use techniques and skills needed for reducing stress and depression.  Initial Review & Psychosocial Screening:  Initial Psych Review & Screening - 08/22/24 1109       Initial Review   Current issues with Current Depression;Current Psychotropic Meds;Current Stress Concerns    Source of Stress Concerns Chronic Illness;Financial;Unable to participate in former interests or hobbies    Comments Holley is currently on a fixed income with her disability check. She also has car trouble and states the money she receives each month is not enough to fix it.      Family Dynamics   Good Support System? Yes    Comments Pt has support from her aunt and friends      Barriers   Psychosocial barriers to participate in program Psychosocial barriers identified (see note)      Screening Interventions   Interventions Encouraged to exercise;Provide feedback about the scores to participant;To provide support and resources with identified psychosocial needs    Expected Outcomes Short Term goal: Utilizing psychosocial counselor, staff and  physician to assist with identification of specific Stressors or current issues interfering with healing process. Setting desired goal for each stressor or current issue identified.;Long Term Goal: Stressors or current issues are controlled or eliminated.;Long Term goal: The participant improves quality of Life and PHQ9 Scores as seen by post scores and/or verbalization of changes;Short Term goal: Identification and review with participant of any Quality of Life or Depression concerns found by scoring the questionnaire.          Quality of Life Scores:  Scores of 19 and below usually indicate a poorer quality of life in these areas.  A difference of  2-3 points is a clinically meaningful difference.  A difference of 2-3 points in the total score of the Quality of Life Index has been associated with significant improvement in overall quality of life, self-image, physical symptoms, and general health in studies assessing change in quality of life.  PHQ-9: Review Flowsheet       08/22/2024  Depression screen PHQ 2/9  Decreased Interest  1  Down, Depressed, Hopeless 1  PHQ - 2 Score 2  Altered sleeping 2  Tired, decreased energy 1  Change in appetite 1  Feeling bad or failure about yourself  2  Trouble concentrating 1  Moving slowly or fidgety/restless 0  Suicidal thoughts 0  PHQ-9 Score 9  Difficult doing work/chores Somewhat difficult   Interpretation of Total Score  Total Score Depression Severity:  1-4 = Minimal depression, 5-9 = Mild depression, 10-14 = Moderate depression, 15-19 = Moderately severe depression, 20-27 = Severe depression   Psychosocial Evaluation and Intervention:  Psychosocial Evaluation - 08/22/24 1205       Psychosocial Evaluation & Interventions   Interventions Stress management education;Relaxation education;Encouraged to exercise with the program and follow exercise prescription    Comments Initial PHQ-2/PHQ-9 score 2/9. [name] states she is dealing with  depression and stress. Her health and financial situation are the main contributors to her mental health barriers. She is taking psychotropic meds and just finished up counseling. Staff will provide education and relaxation techniques to help [name] deal with her daily stressors.    Expected Outcomes For [name] to have less depression and less stress while participating in the program    Continue Psychosocial Services  Follow up required by staff          Psychosocial Re-Evaluation:   Psychosocial Discharge (Final Psychosocial Re-Evaluation):   Education: Education Goals: Education classes will be provided on a weekly basis, covering required topics. Participant will state understanding/return demonstration of topics presented.  Learning Barriers/Preferences:  Learning Barriers/Preferences - 08/22/24 1211       Learning Barriers/Preferences   Learning Barriers Sight   wears glasses   Learning Preferences None          Education Topics: Know Your Numbers Group instruction that is supported by a PowerPoint presentation. Instructor discusses importance of knowing and understanding resting, exercise, and post-exercise oxygen saturation, heart rate, and blood pressure. Oxygen saturation, heart rate, blood pressure, rating of perceived exertion, and dyspnea are reviewed along with a normal range for these values.    Exercise for the Pulmonary Patient Group instruction that is supported by a PowerPoint presentation. Instructor discusses benefits of exercise, core components of exercise, frequency, duration, and intensity of an exercise routine, importance of utilizing pulse oximetry during exercise, safety while exercising, and options of places to exercise outside of rehab.    MET Level  Group instruction provided by PowerPoint, verbal discussion, and written material to support subject matter. Instructor reviews what METs are and how to increase METs.    Pulmonary Medications Verbally  interactive group education provided by instructor with focus on inhaled medications and proper administration.   Anatomy and Physiology of the Respiratory System Group instruction provided by PowerPoint, verbal discussion, and written material to support subject matter. Instructor reviews respiratory cycle and anatomical components of the respiratory system and their functions. Instructor also reviews differences in obstructive and restrictive respiratory diseases with examples of each.    Oxygen Safety Group instruction provided by PowerPoint, verbal discussion, and written material to support subject matter. There is an overview of "What is Oxygen" and "Why do we need it".  Instructor also reviews how to create a safe environment for oxygen use, the importance of using oxygen as prescribed, and the risks of noncompliance. There is a brief discussion on traveling with oxygen and resources the patient may utilize.   Oxygen Use Group instruction provided by PowerPoint, verbal discussion, and written material to discuss how  supplemental oxygen is prescribed and different types of oxygen supply systems. Resources for more information are provided.    Breathing Techniques Group instruction that is supported by demonstration and informational handouts. Instructor discusses the benefits of pursed lip and diaphragmatic breathing and detailed demonstration on how to perform both.     Risk Factor Reduction Group instruction that is supported by a PowerPoint presentation. Instructor discusses the definition of a risk factor, different risk factors for pulmonary disease, and how the heart and lungs work together.   Pulmonary Diseases Group instruction provided by PowerPoint, verbal discussion, and written material to support subject matter. Instructor gives an overview of the different type of pulmonary diseases. There is also a discussion on risk factors and symptoms as well as ways to manage the  diseases.   Stress and Energy Conservation Group instruction provided by PowerPoint, verbal discussion, and written material to support subject matter. Instructor gives an overview of stress and the impact it can have on the body. Instructor also reviews ways to reduce stress. There is also a discussion on energy conservation and ways to conserve energy throughout the day.   Warning Signs and Symptoms Group instruction provided by PowerPoint, verbal discussion, and written material to support subject matter. Instructor reviews warning signs and symptoms of stroke, heart attack, cold and flu. Instructor also reviews ways to prevent the spread of infection.   Other Education Group or individual verbal, written, or video instructions that support the educational goals of the pulmonary rehab program.    Knowledge Questionnaire Score:  Knowledge Questionnaire Score - 08/22/24 1053       Knowledge Questionnaire Score   Pre Score 17/18          Core Components/Risk Factors/Patient Goals at Admission:  Personal Goals and Risk Factors at Admission - 08/22/24 1117       Core Components/Risk Factors/Patient Goals on Admission   Improve shortness of breath with ADL's Yes    Intervention Provide education, individualized exercise plan and daily activity instruction to help decrease symptoms of SOB with activities of daily living.    Expected Outcomes Short Term: Improve cardiorespiratory fitness to achieve a reduction of symptoms when performing ADLs;Long Term: Be able to perform more ADLs without symptoms or delay the onset of symptoms    Stress Yes    Intervention Offer individual and/or small group education and counseling on adjustment to heart disease, stress management and health-related lifestyle change. Teach and support self-help strategies.;Refer participants experiencing significant psychosocial distress to appropriate mental health specialists for further evaluation and treatment.  When possible, include family members and significant others in education/counseling sessions.    Expected Outcomes Short Term: Participant demonstrates changes in health-related behavior, relaxation and other stress management skills, ability to obtain effective social support, and compliance with psychotropic medications if prescribed.;Long Term: Emotional wellbeing is indicated by absence of clinically significant psychosocial distress or social isolation.          Core Components/Risk Factors/Patient Goals Review:    Core Components/Risk Factors/Patient Goals at Discharge (Final Review):    ITP Comments:   Comments: Dr. Slater Staff is Medical Director for Pulmonary Rehab at [facility].

## 2024-08-26 ENCOUNTER — Encounter (HOSPITAL_COMMUNITY)
Admission: RE | Admit: 2024-08-26 | Discharge: 2024-08-26 | Disposition: A | Source: Ambulatory Visit | Attending: Pulmonary Disease

## 2024-08-26 VITALS — Wt 220.5 lb

## 2024-08-26 DIAGNOSIS — I2729 Other secondary pulmonary hypertension: Secondary | ICD-10-CM

## 2024-08-26 NOTE — Progress Notes (Signed)
 Daily Session Note  Patient Details  Name: Dawn Fowler MRN: [id number] Date of Birth: 07/21/1966 Referring Provider:   Conrad Ports Pulmonary Rehab Walk Test from 08/22/2024 in [facility]  Referring Provider [name]    Encounter Date: 08/26/2024  Check In:  Session Check In - 08/26/24 1058       Check-In   Supervising physician immediately available to respond to emergencies [facility] MD immediately available    Physician(s) Lum Louis, NP    Location [facility]    Staff Present Ronal Levin, RN, Maud Moats, MS, ACSM-CEP, Exercise Physiologist;Jamisen Hawes Claudene, RT;Randi Reeve BS, ACSM-CEP, Exercise Physiologist    Virtual Visit No    Medication changes reported     No    Fall or balance concerns reported    No    Tobacco Cessation No Change    Warm-up and Cool-down Performed as group-led instruction    Resistance Training Performed No    VAD Patient? No    PAD/SET Patient? No      Pain Assessment   Currently in Pain? No/denies    Multiple Pain Sites No          Capillary Blood Glucose: No results found for this or any previous visit (from the past 24 hours).   Exercise Prescription Changes - 08/26/24 1200       Response to Exercise   Blood Pressure (Admit) 110/70    Blood Pressure (Exercise) 142/70    Blood Pressure (Exit) 120/80    Heart Rate (Admit) 89 bpm    Heart Rate (Exercise) 109 bpm    Heart Rate (Exit) 85 bpm    Oxygen Saturation (Admit) 92 %    Oxygen Saturation (Exercise) 93 %    Oxygen Saturation (Exit) 97 %    Rating of Perceived Exertion (Exercise) 13    Perceived Dyspnea (Exercise) 1    Duration Continue with 30 min of aerobic exercise without signs/symptoms of physical distress.    Intensity THRR unchanged      Progression   Progression Continue to progress workloads to maintain intensity without signs/symptoms of physical distress.      Resistance Training    Training Prescription Yes    Weight red bands    Reps 10-15    Time 10 Minutes      Oxygen   Oxygen Continuous    Liters 10      Recumbant Bike   Level 1    RPM 50    Watts 12    Minutes 15    METs 1.9      NuStep   Level 1    SPM 76    Minutes 15    METs 1.9      Oxygen   Maintain Oxygen Saturation 88% or higher          Social History   Tobacco Use  Smoking Status Never  Smokeless Tobacco Never    Goals Met:  Proper associated with RPD/PD & O2 Sat Independence with exercise equipment Exercise tolerated well No report of concerns or symptoms today Strength training completed today  Goals Unmet:  Not Applicable  Comments: Service time is from 1021 to 1131.    Dr. Slater [name] is Medical Director for Pulmonary Rehab at [facility].

## 2024-08-27 ENCOUNTER — Telehealth (HOSPITAL_BASED_OUTPATIENT_CLINIC_OR_DEPARTMENT_OTHER): Payer: Self-pay

## 2024-08-27 DIAGNOSIS — J984 Other disorders of lung: Secondary | ICD-10-CM

## 2024-08-27 DIAGNOSIS — J9611 Chronic respiratory failure with hypoxia: Secondary | ICD-10-CM

## 2024-08-27 NOTE — Progress Notes (Signed)
 Pulmonary Individual Treatment Plan  Patient Details  Name: Dawn Fowler MRN: [id number] Date of Birth: 23-Dec-1965 Referring Provider:   Conrad Ports Pulmonary Rehab Walk Test from 08/22/2024 in [facility]  Referring Provider [name]    Initial Encounter Date:  Flowsheet Row Pulmonary Rehab Walk Test from 08/22/2024 in [facility]  Date 08/22/24    Visit Diagnosis: Other secondary pulmonary hypertension (HCC)  Patient's Home Medications on Admission:   Current Outpatient Medications:    acetaminophen  (TYLENOL ) 500 MG tablet, Take 1,000 mg by mouth every 6 (six) hours as needed for moderate pain or headache., Disp: , Rfl:    ADEMPAS  1.5 MG TABS, Take 1.5 mg by mouth 3 (three) times daily. (Patient taking differently: Take 2 mg by mouth 3 (three) times daily.), Disp: , Rfl:    albuterol  (VENTOLIN  HFA) 108 (90 Base) MCG/ACT inhaler, Inhale 2 puffs into the lungs every 6 (six) hours as needed for wheezing or shortness of breath., Disp: 8 g, Rfl: 5   allopurinol  (ZYLOPRIM ) 100 MG tablet, TAKE 1 TABLET(100 MG) BY MOUTH DAILY, Disp: 30 tablet, Rfl: 2   amLODipine  (NORVASC ) 5 MG tablet, Take 1 tablet (5 mg total) by mouth daily., Disp: 90 tablet, Rfl: 3   apixaban  (ELIQUIS ) 5 MG TABS tablet, Take 1 tablet (5 mg total) by mouth 2 (two) times daily., Disp: 60 tablet, Rfl: 9   Cholecalciferol  (VITAMIN D3) 50 MCG (2000 UT) capsule, Take 2,000 Units by mouth in the morning., Disp: , Rfl:    colchicine 0.6 MG tablet, Take 0.6 mg by mouth daily as needed (gout flare)., Disp: , Rfl:    diclofenac Sodium (VOLTAREN) 1 % GEL, Apply 2 g topically 3 (three) times daily as needed (pain.)., Disp: , Rfl:    empagliflozin  (JARDIANCE ) 10 MG TABS tablet, Take 1 tablet (10 mg total) by mouth daily., Disp: 30 tablet, Rfl: 6   famotidine  (PEPCID ) 20 MG tablet, Take 20 mg by mouth 2 (two) times  daily., Disp: , Rfl:    fluticasone  furoate-vilanterol (BREO ELLIPTA ) 200-25 MCG/ACT AEPB, Inhale 1 puff into the lungs daily., Disp: 60 each, Rfl: 11   gabapentin  (NEURONTIN ) 100 MG capsule, Take 100 mg by mouth daily as needed (Pain)., Disp: , Rfl:    ipratropium-albuterol  (DUONEB) 0.5-2.5 (3) MG/3ML SOLN, INHALE THE CONTENTS OF 1 VIAL VIA NEBULIZER EVERY 6 HOURS AS NEEDED FOR WHEEZING OR SHORTNESS OF BREATH, Disp: 1080 mL, Rfl: 2   losartan  (COZAAR ) 25 MG tablet, Take 1 tablet (25 mg total) by mouth daily., Disp: 90 tablet, Rfl: 3   macitentan  (OPSUMIT ) 10 MG tablet, Take 1 tablet (10 mg total) by mouth daily., Disp: 90 tablet, Rfl: 3   OXYGEN, Inhale 4-8 L into the lungs continuous., Disp: , Rfl:    pantoprazole  (PROTONIX ) 40 MG tablet, Take 1 tablet (40 mg total) by mouth daily., Disp: 30 tablet, Rfl: 11   Riociguat  (ADEMPAS ) 0.5 MG TABS, Take 0.5 mg by mouth in the morning, at noon, and at bedtime. (Patient not taking: Reported on 08/22/2024), Disp: , Rfl:    Riociguat  (ADEMPAS ) 2 MG TABS, Take 1 mg by mouth in the morning, at noon, and at bedtime., Disp: , Rfl:    Selexipag  (UPTRAVI ) 800 MCG TABS, Take 1 tablet (800 mcg total) by mouth 2 (two) times daily., Disp: 60 tablet, Rfl: 0   sertraline  (ZOLOFT ) 50 MG tablet, Take 1 tablet (50  mg total) by mouth 2 (two) times daily., Disp: 60 tablet, Rfl: 0   spironolactone  (ALDACTONE ) 25 MG tablet, TAKE 1/2 TABLET(12.5 MG) BY MOUTH DAILY, Disp: 45 tablet, Rfl: 3   tirzepatide  (ZEPBOUND ) 7.5 MG/0.5ML Pen, Inject 7.5 mg into the skin once a week., Disp: 2 mL, Rfl: 0   torsemide  (DEMADEX ) 20 MG tablet, Take 2 tablets (40 mg total) by mouth daily., Disp: 60 tablet, Rfl: 6  Past Medical History: Past Medical History:  Diagnosis Date   Acute respiratory failure due to COVID-19 (HCC) 10/30/2019   Anxiety    Arthritis    CHF (congestive heart failure) (HCC)    CKD (chronic kidney disease), stage III (HCC)    Complication of anesthesia    hard to wake  up after general anesthesia - had trouble waking up after succinylcholine (consider possibility of pseudocholinesterase deficiency)   COVID-19 virus detected 11/20/2019   Tested positive for Covid in December/2020 Required hospitalization in December/2020 through January/2021 Received Redemsivir   Depression    DVT (deep venous thrombosis) (HCC) 06/09/2020   RLE   Dyspnea    Family history of adverse reaction to anesthesia    hard to wake up after general anesthesia   GERD (gastroesophageal reflux disease)    HLD (hyperlipidemia)    HTN (hypertension)    Pre-diabetes    no meds   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; 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, although unsure if she had confirmation testing done   Pulmonary HTN (HCC)    Sleep apnea    on CPAP   Trichomonas infection     Tobacco Use: Social History   Tobacco Use  Smoking Status Never  Smokeless Tobacco Never    Labs: Review Flowsheet  More data may exist      Latest Ref Rng & Units 10/29/2019 12/18/2019 07/12/2020 03/18/2024 07/18/2024  Labs for ITP Cardiac and Pulmonary Rehab  Trlycerides <150 mg/dL 862  - - - -  Hemoglobin A1c 4.8 - 5.6 % - - - 5.3  -  Bicarbonate 20.0 - 28.0 mmol/L 20.0 - 28.0 mmol/L - 23.8  24.8  26.9  25.6  26.4  25.6  28.2  26.7  25.1  26.4  - 21.8  22.4   TCO2 22 - 32 mmol/L 22 - 32 mmol/L - 25  26  28  27  28  27  30  28  26  28  24   - 23  24   Acid-base deficit 0.0 - 2.0 mmol/L 0.0 - 2.0 mmol/L - 3.0  2.0  1.0  - 4.0  4.0   O2 Saturation % % - 61.0  63.0  65.0  65.0  66.0  66.0  70.0  61.0  75.0  72.0  - 69  68     Details       Multiple values from one day are sorted in reverse-chronological order         Capillary Blood Glucose: Lab Results  Component Value Date   GLUCAP 86 07/18/2024   GLUCAP 140 (H) 12/21/2021     Pulmonary Assessment Scores:  Pulmonary Assessment Scores     Row Name 08/22/24 1220          ADL UCSD   ADL Phase Entry     SOB Score total 62       CAT Score   CAT Score 20       mMRC Score  mMRC Score 4       UCSD: Self-administered rating of dyspnea associated with activities of daily living (ADLs) 6-point scale (0 = not at all to 5 = maximal or unable to do because of breathlessness)  Scoring Scores range from 0 to 120.  Minimally important difference is 5 units  CAT: CAT can identify the health impairment of COPD patients and is better correlated with disease progression.  CAT has a scoring range of zero to 40. The CAT score is classified into four groups of low (less than 10), medium (10 - 20), high (21-30) and very high (31-40) based on the impact level of disease on health status. A CAT score over 10 suggests significant symptoms.  A worsening CAT score could be explained by an exacerbation, poor medication adherence, poor inhaler technique, or progression of COPD or comorbid conditions.  CAT MCID is 2 points  mMRC: mMRC (Modified Medical Research Council) Dyspnea Scale is used to assess the degree of baseline functional disability in patients of respiratory disease due to dyspnea. No minimal important difference is established. A decrease in score of 1 point or greater is considered a positive change.   Pulmonary Function Assessment:  Pulmonary Function Assessment - 08/22/24 1116       Breath   Bilateral Breath Sounds Decreased;Clear    Shortness of Breath Yes;Fear of Shortness of Breath;Limiting activity;Panic with Shortness of Breath          Exercise Target Goals: Exercise Program Goal: Individual exercise prescription set using results from initial 6 min walk test and THRR while considering  patient's activity barriers and safety.   Exercise Prescription Goal: Initial exercise prescription builds to 30-45 minutes a day of aerobic activity, 2-3 days per week.  Home exercise guidelines will be given to patient during program as part of exercise  prescription that the participant will acknowledge.  Activity Barriers & Risk Stratification:  Activity Barriers & Cardiac Risk Stratification - 08/22/24 1111       Activity Barriers & Cardiac Risk Stratification   Activity Barriers Deconditioning;Muscular Weakness;Shortness of Breath;Arthritis;History of Falls    Cardiac Risk Stratification Moderate          6 Minute Walk:  6 Minute Walk     Row Name 08/22/24 1214         6 Minute Walk   Phase Initial     Distance 421 feet     Walk Time 6 minutes     # of Rest Breaks 2  1:49-2:57; 4:24-5:20     MPH 0.8     METS 1.45     RPE 11     Perceived Dyspnea  1     VO2 Peak 5.09     Symptoms Yes (comment)     Comments right knee pain     Resting HR 82 bpm     Resting BP 134/88     Resting Oxygen Saturation  91 %     Exercise Oxygen Saturation  during 6 min walk 85 %     Max Ex. HR 121 bpm     Max Ex. BP 136/80     2 Minute Post BP 124/80       Interval HR   1 Minute HR 112     2 Minute HR 114     3 Minute HR 103     4 Minute HR 121     5 Minute HR 99     6 Minute HR 113  2 Minute Post HR 88     Interval Heart Rate? Yes       Interval Oxygen   Interval Oxygen? Yes     Baseline Oxygen Saturation % 91 %     1 Minute Oxygen Saturation % 93 %     1 Minute Liters of Oxygen 6 L     2 Minute Oxygen Saturation % 85 %     2 Minute Liters of Oxygen 6 L  increased to 8L     3 Minute Oxygen Saturation % 93 %     3 Minute Liters of Oxygen 8 L     4 Minute Oxygen Saturation % 86 %     4 Minute Liters of Oxygen 8 L  increased to 10L     5 Minute Oxygen Saturation % 88 %     5 Minute Liters of Oxygen 10 L     6 Minute Oxygen Saturation % 92 %     6 Minute Liters of Oxygen 10 L     2 Minute Post Oxygen Saturation % 97 %     2 Minute Post Liters of Oxygen 10 L        Oxygen Initial Assessment:  Oxygen Initial Assessment - 08/22/24 1114       Home Oxygen   Home Oxygen Device Home Concentrator;E-Tanks    Sleep Oxygen  Prescription Continuous;CPAP    Liters per minute 4    Home Exercise Oxygen Prescription Continuous    Liters per minute 6    Home Resting Oxygen Prescription Continuous    Liters per minute 4    Compliance with Home Oxygen Use Yes      Initial 6 min Walk   Oxygen Used Continuous    Liters per minute 10      Program Oxygen Prescription   Program Oxygen Prescription Continuous    Liters per minute 10      Intervention   Short Term Goals To learn and exhibit compliance with exercise, home and travel O2 prescription;To learn and understand importance of maintaining oxygen saturations>88%;To learn and demonstrate proper use of respiratory medications;To learn and understand importance of monitoring SPO2 with pulse oximeter and demonstrate accurate use of the pulse oximeter.;To learn and demonstrate proper pursed lip breathing techniques or other breathing techniques.     Long  Term Goals Exhibits compliance with exercise, home  and travel O2 prescription;Maintenance of O2 saturations>88%;Compliance with respiratory medication;Verbalizes importance of monitoring SPO2 with pulse oximeter and return demonstration;Exhibits proper breathing techniques, such as pursed lip breathing or other method taught during program session;Demonstrates proper use of MDI's          Oxygen Re-Evaluation:  Oxygen Re-Evaluation     Row Name 08/22/24 1114             Goals/Expected Outcomes   Goals/Expected Outcomes Compliance and understanding of oxygen saturation monitoring and breathing techniques to decrease shortness of breath.          Oxygen Discharge (Final Oxygen Re-Evaluation):  Oxygen Re-Evaluation - 08/22/24 1114       Goals/Expected Outcomes   Goals/Expected Outcomes Compliance and understanding of oxygen saturation monitoring and breathing techniques to decrease shortness of breath.          Initial Exercise Prescription:  Initial Exercise Prescription - 08/22/24 1100        Date of Initial Exercise RX and Referring Provider   Date 08/22/24    Referring Provider [name]    Expected  Discharge Date 11/27/24      Oxygen   Oxygen Continuous    Liters 10    Maintain Oxygen Saturation 88% or higher      Recumbant Bike   Level 1    RPM 50    Watts 80    Minutes 15    METs 1.8      NuStep   Level 1    SPM 60    Minutes 15    METs 2      Prescription Details   Frequency (times per week) 2    Duration Progress to 30 minutes of continuous aerobic without signs/symptoms of physical distress      Intensity   THRR 40-80% of Max Heartrate 65-130    Ratings of Perceived Exertion 11-13    Perceived Dyspnea 0-4      Progression   Progression Continue progressive overload as per policy without signs/symptoms or physical distress.      Resistance Training   Training Prescription Yes    Weight red bands    Reps 10-15          Perform Capillary Blood Glucose checks as needed.  Exercise Prescription Changes:   Exercise Prescription Changes     Row Name 08/26/24 1200             Response to Exercise   Blood Pressure (Admit) 110/70       Blood Pressure (Exercise) 142/70       Blood Pressure (Exit) 120/80       Heart Rate (Admit) 89 bpm       Heart Rate (Exercise) 109 bpm       Heart Rate (Exit) 85 bpm       Oxygen Saturation (Admit) 92 %       Oxygen Saturation (Exercise) 93 %       Oxygen Saturation (Exit) 97 %       Rating of Perceived Exertion (Exercise) 13       Perceived Dyspnea (Exercise) 1       Duration Continue with 30 min of aerobic exercise without signs/symptoms of physical distress.       Intensity THRR unchanged         Progression   Progression Continue to progress workloads to maintain intensity without signs/symptoms of physical distress.         Resistance Training   Training Prescription Yes       Weight red bands       Reps 10-15       Time 10 Minutes         Oxygen   Oxygen Continuous       Liters 10          Recumbant Bike   Level 1       RPM 50       Watts 12       Minutes 15       METs 1.9         NuStep   Level 1       SPM 76       Minutes 15       METs 1.9         Oxygen   Maintain Oxygen Saturation 88% or higher          Exercise Comments:   Exercise Goals and Review:   Exercise Goals     Row Name 08/22/24 1041  Exercise Goals   Increase Physical Activity Yes       Intervention Provide advice, education, support and counseling about physical activity/exercise needs.;Develop an individualized exercise prescription for aerobic and resistive training based on initial evaluation findings, risk stratification, comorbidities and participant's personal goals.       Expected Outcomes Short Term: Attend rehab on a regular basis to increase amount of physical activity.;Long Term: Exercising regularly at least 3-5 days a week.;Long Term: Add in home exercise to make exercise part of routine and to increase amount of physical activity.       Increase Strength and Stamina Yes       Intervention Provide advice, education, support and counseling about physical activity/exercise needs.;Develop an individualized exercise prescription for aerobic and resistive training based on initial evaluation findings, risk stratification, comorbidities and participant's personal goals.       Expected Outcomes Short Term: Increase workloads from initial exercise prescription for resistance, speed, and METs.;Short Term: Perform resistance training exercises routinely during rehab and add in resistance training at home;Long Term: Improve cardiorespiratory fitness, muscular endurance and strength as measured by increased METs and functional capacity ( )       Able to understand and use rate of perceived exertion (RPE) scale Yes       Intervention Provide education and explanation on how to use RPE scale       Expected Outcomes Short Term: Able to use RPE daily in rehab to express subjective  intensity level;Long Term:  Able to use RPE to guide intensity level when exercising independently       Able to understand and use Dyspnea scale Yes       Intervention Provide education and explanation on how to use Dyspnea scale       Expected Outcomes Short Term: Able to use Dyspnea scale daily in rehab to express subjective sense of shortness of breath during exertion;Long Term: Able to use Dyspnea scale to guide intensity level when exercising independently       Knowledge and understanding of Target Heart Rate Range (THRR) Yes       Intervention Provide education and explanation of THRR including how the numbers were predicted and where they are located for reference       Expected Outcomes Short Term: Able to state/look up THRR;Long Term: Able to use THRR to govern intensity when exercising independently;Short Term: Able to use daily as guideline for intensity in rehab       Understanding of Exercise Prescription Yes       Intervention Provide education, explanation, and written materials on patient's individual exercise prescription       Expected Outcomes Short Term: Able to explain program exercise prescription;Long Term: Able to explain home exercise prescription to exercise independently          Exercise Goals Re-Evaluation :  Exercise Goals Re-Evaluation     Row Name 08/22/24 1327             Exercise Goal Re-Evaluation   Exercise Goals Review Increase Physical Activity;Able to understand and use Dyspnea scale;Understanding of Exercise Prescription;Increase Strength and Stamina;Knowledge and understanding of Target Heart Rate Range (THRR);Able to understand and use rate of perceived exertion (RPE) scale       Comments [name] is scheduled to begin exercise on 10/30. Will continue to monitor and progress as able.       Expected Outcomes Through exercise at rehab and home, the patient will decrease shortness of breath with daily activities and  feel confident in carrying out an  exercise regimen at home.          Discharge Exercise Prescription (Final Exercise Prescription Changes):  Exercise Prescription Changes - 08/26/24 1200       Response to Exercise   Blood Pressure (Admit) 110/70    Blood Pressure (Exercise) 142/70    Blood Pressure (Exit) 120/80    Heart Rate (Admit) 89 bpm    Heart Rate (Exercise) 109 bpm    Heart Rate (Exit) 85 bpm    Oxygen Saturation (Admit) 92 %    Oxygen Saturation (Exercise) 93 %    Oxygen Saturation (Exit) 97 %    Rating of Perceived Exertion (Exercise) 13    Perceived Dyspnea (Exercise) 1    Duration Continue with 30 min of aerobic exercise without signs/symptoms of physical distress.    Intensity THRR unchanged      Progression   Progression Continue to progress workloads to maintain intensity without signs/symptoms of physical distress.      Resistance Training   Training Prescription Yes    Weight red bands    Reps 10-15    Time 10 Minutes      Oxygen   Oxygen Continuous    Liters 10      Recumbant Bike   Level 1    RPM 50    Watts 12    Minutes 15    METs 1.9      NuStep   Level 1    SPM 76    Minutes 15    METs 1.9      Oxygen   Maintain Oxygen Saturation 88% or higher          Nutrition:  Target Goals: Understanding of nutrition guidelines, daily intake of sodium 1500mg , cholesterol 200mg , calories 30% from fat and 7% or less from saturated fats, daily to have 5 or more servings of fruits and vegetables.  Biometrics:  Pre Biometrics - 08/22/24 1211       Pre Biometrics   Grip Strength 8 kg           Nutrition Therapy Plan and Nutrition Goals:   Nutrition Assessments:  MEDIFICTS Score Key: >=70 Need to make dietary changes  40-70 Heart Healthy Diet <= 40 Therapeutic Level Cholesterol Diet   Picture Your Plate Scores: <59 Unhealthy dietary pattern with much room for improvement. 41-50 Dietary pattern unlikely to meet recommendations for good health and room for  improvement. 51-60 More healthful dietary pattern, with some room for improvement.  >60 Healthy dietary pattern, although there may be some specific behaviors that could be improved.    Nutrition Goals Re-Evaluation:   Nutrition Goals Discharge (Final Nutrition Goals Re-Evaluation):   Psychosocial: Target Goals: Acknowledge presence or absence of significant depression and/or stress, maximize coping skills, provide positive support system. Participant is able to verbalize types and ability to use techniques and skills needed for reducing stress and depression.  Initial Review & Psychosocial Screening:  Initial Psych Review & Screening - 08/22/24 1109       Initial Review   Current issues with Current Depression;Current Psychotropic Meds;Current Stress Concerns    Source of Stress Concerns Chronic Illness;Financial;Unable to participate in former interests or hobbies    Comments Holley is currently on a fixed income with her disability check. She also has car trouble and states the money she receives each month is not enough to fix it.      Family Dynamics   Good Support System?  Yes    Comments Pt has support from her aunt and friends      Barriers   Psychosocial barriers to participate in program Psychosocial barriers identified (see note)      Screening Interventions   Interventions Encouraged to exercise;Provide feedback about the scores to participant;To provide support and resources with identified psychosocial needs    Expected Outcomes Short Term goal: Utilizing psychosocial counselor, staff and physician to assist with identification of specific Stressors or current issues interfering with healing process. Setting desired goal for each stressor or current issue identified.;Long Term Goal: Stressors or current issues are controlled or eliminated.;Long Term goal: The participant improves quality of Life and PHQ9 Scores as seen by post scores and/or verbalization of changes;Short  Term goal: Identification and review with participant of any Quality of Life or Depression concerns found by scoring the questionnaire.          Quality of Life Scores:  Scores of 19 and below usually indicate a poorer quality of life in these areas.  A difference of  2-3 points is a clinically meaningful difference.  A difference of 2-3 points in the total score of the Quality of Life Index has been associated with significant improvement in overall quality of life, self-image, physical symptoms, and general health in studies assessing change in quality of life.  PHQ-9: Review Flowsheet       08/22/2024  Depression screen PHQ 2/9  Decreased Interest 1  Down, Depressed, Hopeless 1  PHQ - 2 Score 2  Altered sleeping 2  Tired, decreased energy 1  Change in appetite 1  Feeling bad or failure about yourself  2  Trouble concentrating 1  Moving slowly or fidgety/restless 0  Suicidal thoughts 0  PHQ-9 Score 9  Difficult doing work/chores Somewhat difficult   Interpretation of Total Score  Total Score Depression Severity:  1-4 = Minimal depression, 5-9 = Mild depression, 10-14 = Moderate depression, 15-19 = Moderately severe depression, 20-27 = Severe depression   Psychosocial Evaluation and Intervention:  Psychosocial Evaluation - 08/22/24 1205       Psychosocial Evaluation & Interventions   Interventions Stress management education;Relaxation education;Encouraged to exercise with the program and follow exercise prescription    Comments Initial PHQ-2/PHQ-9 score 2/9. [name] states she is dealing with depression and stress. Her health and financial situation are the main contributors to her mental health barriers. She is taking psychotropic meds and just finished up counseling. Staff will provide education and relaxation techniques to help [name] deal with her daily stressors.    Expected Outcomes For [name] to have less depression and less stress while participating in the program     Continue Psychosocial Services  Follow up required by staff          Psychosocial Re-Evaluation:  Psychosocial Re-Evaluation     Row Name 08/25/24 0814             Psychosocial Re-Evaluation   Current issues with Current Depression;Current Psychotropic Meds;Current Stress Concerns       Comments [name] is scheduled to start PR class on 08/26/24. No new barriers or concerns since orientation.       Expected Outcomes For [name] to have less depression and stress while participating in the program.       Interventions Encouraged to attend Pulmonary Rehabilitation for the exercise;Stress management education;Relaxation education       Continue Psychosocial Services  Follow up required by staff  Psychosocial Discharge (Final Psychosocial Re-Evaluation):  Psychosocial Re-Evaluation - 08/25/24 0814       Psychosocial Re-Evaluation   Current issues with Current Depression;Current Psychotropic Meds;Current Stress Concerns    Comments [name] is scheduled to start PR class on 08/26/24. No new barriers or concerns since orientation.    Expected Outcomes For [name] to have less depression and stress while participating in the program.    Interventions Encouraged to attend Pulmonary Rehabilitation for the exercise;Stress management education;Relaxation education    Continue Psychosocial Services  Follow up required by staff          Education: Education Goals: Education classes will be provided on a weekly basis, covering required topics. Participant will state understanding/return demonstration of topics presented.  Learning Barriers/Preferences:  Learning Barriers/Preferences - 08/22/24 1211       Learning Barriers/Preferences   Learning Barriers Sight   wears glasses   Learning Preferences None          Education Topics: Know Your Numbers Group instruction that is supported by a PowerPoint presentation. Instructor discusses importance of knowing and understanding resting,  exercise, and post-exercise oxygen saturation, heart rate, and blood pressure. Oxygen saturation, heart rate, blood pressure, rating of perceived exertion, and dyspnea are reviewed along with a normal range for these values.    Exercise for the Pulmonary Patient Group instruction that is supported by a PowerPoint presentation. Instructor discusses benefits of exercise, core components of exercise, frequency, duration, and intensity of an exercise routine, importance of utilizing pulse oximetry during exercise, safety while exercising, and options of places to exercise outside of rehab.    MET Level  Group instruction provided by PowerPoint, verbal discussion, and written material to support subject matter. Instructor reviews what METs are and how to increase METs.    Pulmonary Medications Verbally interactive group education provided by instructor with focus on inhaled medications and proper administration.   Anatomy and Physiology of the Respiratory System Group instruction provided by PowerPoint, verbal discussion, and written material to support subject matter. Instructor reviews respiratory cycle and anatomical components of the respiratory system and their functions. Instructor also reviews differences in obstructive and restrictive respiratory diseases with examples of each.    Oxygen Safety Group instruction provided by PowerPoint, verbal discussion, and written material to support subject matter. There is an overview of "What is Oxygen" and "Why do we need it".  Instructor also reviews how to create a safe environment for oxygen use, the importance of using oxygen as prescribed, and the risks of noncompliance. There is a brief discussion on traveling with oxygen and resources the patient may utilize.   Oxygen Use Group instruction provided by PowerPoint, verbal discussion, and written material to discuss how supplemental oxygen is prescribed and different types of oxygen supply  systems. Resources for more information are provided.    Breathing Techniques Group instruction that is supported by demonstration and informational handouts. Instructor discusses the benefits of pursed lip and diaphragmatic breathing and detailed demonstration on how to perform both.     Risk Factor Reduction Group instruction that is supported by a PowerPoint presentation. Instructor discusses the definition of a risk factor, different risk factors for pulmonary disease, and how the heart and lungs work together.   Pulmonary Diseases Group instruction provided by PowerPoint, verbal discussion, and written material to support subject matter. Instructor gives an overview of the different type of pulmonary diseases. There is also a discussion on risk factors and symptoms as well as ways to manage  the diseases.   Stress and Energy Conservation Group instruction provided by PowerPoint, verbal discussion, and written material to support subject matter. Instructor gives an overview of stress and the impact it can have on the body. Instructor also reviews ways to reduce stress. There is also a discussion on energy conservation and ways to conserve energy throughout the day.   Warning Signs and Symptoms Group instruction provided by PowerPoint, verbal discussion, and written material to support subject matter. Instructor reviews warning signs and symptoms of stroke, heart attack, cold and flu. Instructor also reviews ways to prevent the spread of infection.   Other Education Group or individual verbal, written, or video instructions that support the educational goals of the pulmonary rehab program.    Knowledge Questionnaire Score:  Knowledge Questionnaire Score - 08/22/24 1053       Knowledge Questionnaire Score   Pre Score 17/18          Core Components/Risk Factors/Patient Goals at Admission:  Personal Goals and Risk Factors at Admission - 08/22/24 1117       Core  Components/Risk Factors/Patient Goals on Admission   Improve shortness of breath with ADL's Yes    Intervention Provide education, individualized exercise plan and daily activity instruction to help decrease symptoms of SOB with activities of daily living.    Expected Outcomes Short Term: Improve cardiorespiratory fitness to achieve a reduction of symptoms when performing ADLs;Long Term: Be able to perform more ADLs without symptoms or delay the onset of symptoms    Stress Yes    Intervention Offer individual and/or small group education and counseling on adjustment to heart disease, stress management and health-related lifestyle change. Teach and support self-help strategies.;Refer participants experiencing significant psychosocial distress to appropriate mental health specialists for further evaluation and treatment. When possible, include family members and significant others in education/counseling sessions.    Expected Outcomes Short Term: Participant demonstrates changes in health-related behavior, relaxation and other stress management skills, ability to obtain effective social support, and compliance with psychotropic medications if prescribed.;Long Term: Emotional wellbeing is indicated by absence of clinically significant psychosocial distress or social isolation.          Core Components/Risk Factors/Patient Goals Review:   Goals and Risk Factor Review     Row Name 08/25/24 0816             Core Components/Risk Factors/Patient Goals Review   Personal Goals Review Improve shortness of breath with ADL's;Develop more efficient breathing techniques such as purse lipped breathing and diaphragmatic breathing and practicing self-pacing with activity.;Stress       Review [name] is scheduled to start the PR program on 08/26/24. Unable to asses her goals at this time.       Expected Outcomes To improve shortness of breath with ADL's, develop more efficient breathing techniques such as purse  lipped breathing and diaphragmatic breathing; and practicing self-pacing with activity and have less stress.          Core Components/Risk Factors/Patient Goals at Discharge (Final Review):   Goals and Risk Factor Review - 08/25/24 0816       Core Components/Risk Factors/Patient Goals Review   Personal Goals Review Improve shortness of breath with ADL's;Develop more efficient breathing techniques such as purse lipped breathing and diaphragmatic breathing and practicing self-pacing with activity.;Stress    Review [name] is scheduled to start the PR program on 08/26/24. Unable to asses her goals at this time.    Expected Outcomes To improve shortness of breath with  ADL's, develop more efficient breathing techniques such as purse lipped breathing and diaphragmatic breathing; and practicing self-pacing with activity and have less stress.          ITP Comments:   Comments: Pt is making expected progress toward Pulmonary Rehab goals after completing 1 session(s). Recommend continued exercise, life style modification, education, and utilization of breathing techniques to increase stamina and strength, while also decreasing shortness of breath with exertion.  Dr. Slater Staff is Medical Director for Pulmonary Rehab at [facility].

## 2024-08-27 NOTE — Telephone Encounter (Signed)
 Copied from CRM #[id number]. Topic: Clinical - Order For Equipment >> Aug 27, 2024  9:35 AM Rilla NOVAK wrote: Reason for CRM: [facility] calling to request that Dr [name] order the patient an Oxymizer.  They understand Dr [name] is on vacation and hoping another provider will sign off on it.  Any questions call

## 2024-08-28 ENCOUNTER — Encounter (HOSPITAL_COMMUNITY)
Admission: RE | Admit: 2024-08-28 | Discharge: 2024-08-28 | Disposition: A | Source: Ambulatory Visit | Attending: Pulmonary Disease

## 2024-08-28 DIAGNOSIS — I2729 Other secondary pulmonary hypertension: Secondary | ICD-10-CM

## 2024-08-28 NOTE — Progress Notes (Signed)
 Daily Session Note  Patient Details  Name: Aliceson Dolbow MRN: [id number] Date of Birth: October 29, 1966 Referring Provider:   Conrad Ports Pulmonary Rehab Walk Test from 08/22/2024 in [facility] for Heart, Vascular, & Lung Health  Referring Provider [name]    Encounter Date: 08/28/2024  Check In:  Session Check In - 08/28/24 1126       Check-In   Supervising physician immediately available to respond to emergencies [facility] MD immediately available    Physician(s) Rosaline Skains, NP    Location [facility]    Staff Present Ronal Levin, RN, Maud Moats, MS, ACSM-CEP, Exercise Physiologist;Khyri Hinzman Claudene Idelia Aris BS, ACSM-CEP, Exercise Physiologist    Virtual Visit No    Medication changes reported     No    Fall or balance concerns reported    No    Tobacco Cessation No Change    Warm-up and Cool-down Performed as group-led instruction    Resistance Training Performed No    VAD Patient? No    PAD/SET Patient? No      Pain Assessment   Currently in Pain? No/denies    Multiple Pain Sites No          Capillary Blood Glucose: No results found for this or any previous visit (from the past 24 hours).    Social History   Tobacco Use  Smoking Status Never  Smokeless Tobacco Never    Goals Met:  Proper associated with RPD/PD & O2 Sat Independence with exercise equipment Exercise tolerated well No report of concerns or symptoms today Strength training completed today  Goals Unmet:  Not Applicable  Comments: Service time is from 1018 to 1146.    Dr. Slater [name] is Medical Director for Pulmonary Rehab at [facility].

## 2024-09-01 ENCOUNTER — Encounter: Payer: Self-pay | Admitting: Radiology

## 2024-09-02 ENCOUNTER — Encounter (HOSPITAL_COMMUNITY): Admission: RE | Admit: 2024-09-02 | Source: Ambulatory Visit

## 2024-09-02 ENCOUNTER — Telehealth (HOSPITAL_COMMUNITY): Payer: Self-pay

## 2024-09-02 NOTE — Telephone Encounter (Signed)
 Patient c/o for 10:15 PR class, no reason given.

## 2024-09-04 ENCOUNTER — Encounter (HOSPITAL_BASED_OUTPATIENT_CLINIC_OR_DEPARTMENT_OTHER): Payer: Self-pay | Admitting: Pulmonary Disease

## 2024-09-04 ENCOUNTER — Encounter (HOSPITAL_COMMUNITY)
Admission: RE | Admit: 2024-09-04 | Discharge: 2024-09-04 | Disposition: A | Discharge status: Home / Self Care | Source: Ambulatory Visit | Attending: Pulmonary Disease | Admitting: Pulmonary Disease

## 2024-09-04 DIAGNOSIS — I2729 Other secondary pulmonary hypertension: Secondary | ICD-10-CM | POA: Insufficient documentation

## 2024-09-04 NOTE — Progress Notes (Signed)
 [name] Incomplete Session Note  Patient Details  Name: Dawn Fowler MRN: [id number] Date of Birth: [birth year] Referring Provider:   Conrad Ports Pulmonary Rehab Walk Test from 08/22/2024 in [facility] for Heart, Vascular, & Lung Health  Referring Provider [name] did not complete her rehab session.  [name] said that she received her Covid 19 vaccine yesterday, Reported feeling lightheaded on the walking track. Exercise stopped by patient sitting in chair. [name] said she had not eaten this morning. Blood pressure 110/72. Oxygen saturation 99% on 10l/min. Heart rate 81.  Patient was given apple juice and a 1/2 of an apple. Patient was also given water . [name] reported feeling better after eating and resting for a few minutes. Advised not to exercise today. Repeat blood pressure 118/70 heart rate 77.Oxygen saturation 100% on 10 L/min. Reminded [name] to make sure that she eats prior to coming to exercise at cardiac rehab. Patient states understanding. [name] left pulmonary rehab without further complaints or symptoms. [name] plans to return to pulmonary rehab on Tuesday.[name] Elpidio Quan RN BSN

## 2024-09-05 ENCOUNTER — Encounter (HOSPITAL_BASED_OUTPATIENT_CLINIC_OR_DEPARTMENT_OTHER): Payer: Self-pay | Admitting: Pulmonary Disease

## 2024-09-05 DIAGNOSIS — J9611 Chronic respiratory failure with hypoxia: Secondary | ICD-10-CM

## 2024-09-05 DIAGNOSIS — J984 Other disorders of lung: Secondary | ICD-10-CM

## 2024-09-08 NOTE — Telephone Encounter (Signed)
 Order sent.

## 2024-09-08 NOTE — Telephone Encounter (Signed)
 Please advise if okay to send order

## 2024-09-09 ENCOUNTER — Encounter (HOSPITAL_COMMUNITY)
Admission: RE | Admit: 2024-09-09 | Discharge: 2024-09-09 | Disposition: A | Source: Ambulatory Visit | Attending: Pulmonary Disease

## 2024-09-09 VITALS — Wt 223.1 lb

## 2024-09-09 DIAGNOSIS — I2729 Other secondary pulmonary hypertension: Secondary | ICD-10-CM | POA: Diagnosis present

## 2024-09-09 NOTE — Progress Notes (Signed)
 Daily Session Note  Patient Details  Name: Dawn Fowler MRN: [id number] Date of Birth: 1966-02-25 Referring Provider:   Conrad Ports Pulmonary Rehab Walk Test from 08/22/2024 in [facility] for Heart, Vascular, & Lung Health  Referring Provider [name]    Encounter Date: 09/09/2024  Check In:  Session Check In - 09/09/24 1118       Check-In   Supervising physician immediately available to respond to emergencies [facility] MD immediately available    Physician(s) Barnie Press, NP    [facility]    Staff Present Johnnie Moats, MS, ACSM-CEP, Exercise Physiologist;Casey Claudene Unknown Candy, MS, Exercise Physiologist;Mary Harvy, RN, BSN;Narcissa Melder BS, ACSM-CEP, Exercise Physiologist    Virtual Visit No    Medication changes reported     No    Fall or balance concerns reported    No    Tobacco Cessation No Change    Warm-up and Cool-down Performed as group-led instruction    Resistance Training Performed No    VAD Patient? No    PAD/SET Patient? No      Pain Assessment   Currently in Pain? No/denies    Multiple Pain Sites No          Capillary Blood Glucose: No results found for this or any previous visit (from the past 24 hours).   Exercise Prescription Changes - 09/09/24 1200       Response to Exercise   Blood Pressure (Admit) 124/68    Blood Pressure (Exercise) 128/72    Blood Pressure (Exit) 120/70    Heart Rate (Admit) 85 bpm    Heart Rate (Exercise) 107 bpm    Heart Rate (Exit) 83 bpm    Oxygen Saturation (Admit) 87 %    Oxygen Saturation (Exercise) 91 %    Oxygen Saturation (Exit) 94 %    Rating of Perceived Exertion (Exercise) 15    Perceived Dyspnea (Exercise) 2    Duration Continue with 30 min of aerobic exercise without signs/symptoms of physical distress.    Intensity THRR unchanged      Progression   Progression Continue to progress workloads to maintain intensity without signs/symptoms of  physical distress.      Resistance Training   Training Prescription Yes    Weight red bands    Reps 10-15    Time 10 Minutes      Oxygen   Oxygen Continuous    Liters 15      Recumbant Bike   Level 1    Minutes 15    METs 1.9      NuStep   Level 1    METs 1.7      Oxygen   Maintain Oxygen Saturation 88% or higher          Social History   Tobacco Use  Smoking Status Never  Smokeless Tobacco Never    Goals Met:  Exercise tolerated well No report of concerns or symptoms today Strength training completed today  Goals Unmet:  Not Applicable  Comments: Service time is from 1006 to 1136.    Dr. Slater [name] is Medical Director for Pulmonary Rehab at [facility]

## 2024-09-10 ENCOUNTER — Other Ambulatory Visit (HOSPITAL_COMMUNITY): Payer: Self-pay | Admitting: Family Medicine

## 2024-09-10 ENCOUNTER — Telehealth (HOSPITAL_COMMUNITY): Payer: Self-pay

## 2024-09-10 DIAGNOSIS — I2729 Other secondary pulmonary hypertension: Secondary | ICD-10-CM

## 2024-09-10 NOTE — Telephone Encounter (Signed)
 Dr. [name], Can you send in an order for an oxymizer for Ms. Haider?  Thanks, Augustin Sharps, RRT

## 2024-09-11 ENCOUNTER — Encounter (HOSPITAL_COMMUNITY): Payer: Self-pay | Admitting: Cardiology

## 2024-09-11 ENCOUNTER — Encounter (HOSPITAL_BASED_OUTPATIENT_CLINIC_OR_DEPARTMENT_OTHER): Payer: Self-pay | Admitting: Pulmonary Disease

## 2024-09-11 ENCOUNTER — Encounter (HOSPITAL_COMMUNITY)
Admission: RE | Admit: 2024-09-11 | Discharge: 2024-09-11 | Disposition: A | Source: Ambulatory Visit | Attending: Pulmonary Disease

## 2024-09-11 DIAGNOSIS — I2729 Other secondary pulmonary hypertension: Secondary | ICD-10-CM | POA: Diagnosis not present

## 2024-09-11 NOTE — Telephone Encounter (Signed)
 Please place STAT order for oxymizer for patient. Let me know if there is any issues ordering.

## 2024-09-11 NOTE — Progress Notes (Signed)
 Daily Session Note  Patient Details  Name: Dawn Fowler MRN: [id number] Date of Birth: Mar 05, 1966 Referring Provider:   Conrad Ports Pulmonary Rehab Walk Test from 08/22/2024 in [facility] for Heart, Vascular, & Lung Health  Referring Provider [name]    Encounter Date: 09/11/2024  Check In:  Session Check In - 09/11/24 1200       Check-In   Supervising physician immediately available to respond to emergencies [facility] MD immediately available    Physician(s) Josefa Beauvais, NP    [facility]    Staff Present Johnnie Moats, MS, ACSM-CEP, Exercise Physiologist;Casey Claudene Candia Levin, RN, BSN;Randi Reeve BS, ACSM-CEP, Exercise Physiologist    Virtual Visit No    Medication changes reported     No    Fall or balance concerns reported    No    Tobacco Cessation No Change    Warm-up and Cool-down Performed as group-led instruction    Resistance Training Performed No    VAD Patient? No    PAD/SET Patient? No      Pain Assessment   Currently in Pain? No/denies    Multiple Pain Sites No          Capillary Blood Glucose: No results found for this or any previous visit (from the past 24 hours).    Social History   Tobacco Use  Smoking Status Never  Smokeless Tobacco Never    Goals Met:  Proper associated with RPD/PD & O2 Sat Exercise tolerated well No report of concerns or symptoms today Strength training completed today  Goals Unmet:  Not Applicable  Comments: Service time is from 1018 to 1146.    Dr. Slater [name] is Medical Director for Pulmonary Rehab at [facility].

## 2024-09-11 NOTE — Telephone Encounter (Signed)
 Urgent order placed

## 2024-09-11 NOTE — Addendum Note (Signed)
 Addended by: Livio Ledwith L on: 09/11/2024 08:00 AM   Modules accepted: Orders

## 2024-09-12 ENCOUNTER — Other Ambulatory Visit (HOSPITAL_COMMUNITY): Payer: Self-pay

## 2024-09-15 ENCOUNTER — Other Ambulatory Visit: Payer: Self-pay

## 2024-09-15 ENCOUNTER — Inpatient Hospital Stay (HOSPITAL_COMMUNITY)
Admission: EM | Admit: 2024-09-15 | Discharge: 2024-09-18 | DRG: 291 | Disposition: A | Discharge status: Home / Self Care | Attending: Internal Medicine | Admitting: Internal Medicine

## 2024-09-15 ENCOUNTER — Ambulatory Visit: Payer: Self-pay

## 2024-09-15 ENCOUNTER — Emergency Department (HOSPITAL_COMMUNITY)

## 2024-09-15 DIAGNOSIS — Z7984 Long term (current) use of oral hypoglycemic drugs: Secondary | ICD-10-CM

## 2024-09-15 DIAGNOSIS — I50810 Right heart failure, unspecified: Secondary | ICD-10-CM | POA: Diagnosis present

## 2024-09-15 DIAGNOSIS — I2722 Pulmonary hypertension due to left heart disease: Secondary | ICD-10-CM | POA: Diagnosis present

## 2024-09-15 DIAGNOSIS — Z8616 Personal history of COVID-19: Secondary | ICD-10-CM

## 2024-09-15 DIAGNOSIS — I5033 Acute on chronic diastolic (congestive) heart failure: Secondary | ICD-10-CM | POA: Diagnosis present

## 2024-09-15 DIAGNOSIS — Z91048 Other nonmedicinal substance allergy status: Secondary | ICD-10-CM

## 2024-09-15 DIAGNOSIS — Z833 Family history of diabetes mellitus: Secondary | ICD-10-CM

## 2024-09-15 DIAGNOSIS — Z9981 Dependence on supplemental oxygen: Secondary | ICD-10-CM

## 2024-09-15 DIAGNOSIS — G4733 Obstructive sleep apnea (adult) (pediatric): Secondary | ICD-10-CM | POA: Diagnosis present

## 2024-09-15 DIAGNOSIS — N179 Acute kidney failure, unspecified: Secondary | ICD-10-CM | POA: Diagnosis present

## 2024-09-15 DIAGNOSIS — Z888 Allergy status to other drugs, medicaments and biological substances status: Secondary | ICD-10-CM

## 2024-09-15 DIAGNOSIS — Z79899 Other long term (current) drug therapy: Secondary | ICD-10-CM

## 2024-09-15 DIAGNOSIS — J9620 Acute and chronic respiratory failure, unspecified whether with hypoxia or hypercapnia: Secondary | ICD-10-CM | POA: Diagnosis not present

## 2024-09-15 DIAGNOSIS — I130 Hypertensive heart and chronic kidney disease with heart failure and stage 1 through stage 4 chronic kidney disease, or unspecified chronic kidney disease: Principal | ICD-10-CM | POA: Diagnosis present

## 2024-09-15 DIAGNOSIS — Z86718 Personal history of other venous thrombosis and embolism: Secondary | ICD-10-CM

## 2024-09-15 DIAGNOSIS — Z6841 Body Mass Index (BMI) 40.0 and over, adult: Secondary | ICD-10-CM

## 2024-09-15 DIAGNOSIS — E66813 Obesity, class 3: Secondary | ICD-10-CM | POA: Diagnosis present

## 2024-09-15 DIAGNOSIS — Z825 Family history of asthma and other chronic lower respiratory diseases: Secondary | ICD-10-CM

## 2024-09-15 DIAGNOSIS — Z7901 Long term (current) use of anticoagulants: Secondary | ICD-10-CM

## 2024-09-15 DIAGNOSIS — N1832 Chronic kidney disease, stage 3b: Secondary | ICD-10-CM | POA: Diagnosis present

## 2024-09-15 DIAGNOSIS — I2723 Pulmonary hypertension due to lung diseases and hypoxia: Secondary | ICD-10-CM | POA: Diagnosis present

## 2024-09-15 DIAGNOSIS — Z8249 Family history of ischemic heart disease and other diseases of the circulatory system: Secondary | ICD-10-CM

## 2024-09-15 DIAGNOSIS — I10 Essential (primary) hypertension: Secondary | ICD-10-CM | POA: Diagnosis present

## 2024-09-15 DIAGNOSIS — M109 Gout, unspecified: Secondary | ICD-10-CM | POA: Diagnosis present

## 2024-09-15 DIAGNOSIS — K219 Gastro-esophageal reflux disease without esophagitis: Secondary | ICD-10-CM | POA: Diagnosis present

## 2024-09-15 DIAGNOSIS — E785 Hyperlipidemia, unspecified: Secondary | ICD-10-CM | POA: Diagnosis present

## 2024-09-15 DIAGNOSIS — J9621 Acute and chronic respiratory failure with hypoxia: Principal | ICD-10-CM | POA: Diagnosis present

## 2024-09-15 DIAGNOSIS — I5082 Biventricular heart failure: Secondary | ICD-10-CM | POA: Diagnosis present

## 2024-09-15 DIAGNOSIS — Z91040 Latex allergy status: Secondary | ICD-10-CM

## 2024-09-15 DIAGNOSIS — F339 Major depressive disorder, recurrent, unspecified: Secondary | ICD-10-CM | POA: Diagnosis present

## 2024-09-15 DIAGNOSIS — Z7985 Long-term (current) use of injectable non-insulin antidiabetic drugs: Secondary | ICD-10-CM

## 2024-09-15 DIAGNOSIS — Z86711 Personal history of pulmonary embolism: Secondary | ICD-10-CM

## 2024-09-15 DIAGNOSIS — Z803 Family history of malignant neoplasm of breast: Secondary | ICD-10-CM

## 2024-09-15 DIAGNOSIS — I272 Pulmonary hypertension, unspecified: Secondary | ICD-10-CM | POA: Diagnosis present

## 2024-09-15 DIAGNOSIS — J984 Other disorders of lung: Secondary | ICD-10-CM | POA: Diagnosis present

## 2024-09-15 LAB — COMPREHENSIVE METABOLIC PANEL WITH GFR
ALT: 12 U/L (ref 0–44)
AST: 19 U/L (ref 15–41)
Albumin: 4.2 g/dL (ref 3.5–5.0)
Alkaline Phosphatase: 95 U/L (ref 38–126)
Anion gap: 12 (ref 5–15)
BUN: 34 mg/dL — ABNORMAL HIGH (ref 6–20)
CO2: 22 mmol/L (ref 22–32)
Calcium: 9.9 mg/dL (ref 8.9–10.3)
Chloride: 104 mmol/L (ref 98–111)
Creatinine, Ser: 2.21 mg/dL — ABNORMAL HIGH (ref 0.44–1.00)
GFR, Estimated: 25 mL/min — ABNORMAL LOW (ref >60)
Glucose, Bld: 102 mg/dL — ABNORMAL HIGH (ref 70–99)
Potassium: 3.7 mmol/L (ref 3.5–5.1)
Sodium: 138 mmol/L (ref 135–145)
Total Bilirubin: 0.4 mg/dL (ref 0.0–1.2)
Total Protein: 6.9 g/dL (ref 6.5–8.1)

## 2024-09-15 LAB — RESP PANEL BY RT-PCR (RSV, FLU A&B, COVID)  RVPGX2
Influenza A by PCR: NEGATIVE
Influenza B by PCR: NEGATIVE
Resp Syncytial Virus by PCR: NEGATIVE
SARS Coronavirus 2 by RT PCR: NEGATIVE

## 2024-09-15 LAB — CBC WITH DIFFERENTIAL/PLATELET
Abs Immature Granulocytes: 0.02 K/uL (ref 0.00–0.07)
Basophils Absolute: 0 K/uL (ref 0.0–0.1)
Basophils Relative: 1 %
Eosinophils Absolute: 0.1 K/uL (ref 0.0–0.5)
Eosinophils Relative: 2 %
HCT: 39.2 % (ref 36.0–46.0)
Hemoglobin: 12.4 g/dL (ref 12.0–15.0)
Immature Granulocytes: 0 %
Lymphocytes Relative: 20 %
Lymphs Abs: 1.2 K/uL (ref 0.7–4.0)
MCH: 31.2 pg (ref 26.0–34.0)
MCHC: 31.6 g/dL (ref 30.0–36.0)
MCV: 98.5 fL (ref 80.0–100.0)
Monocytes Absolute: 0.4 K/uL (ref 0.1–1.0)
Monocytes Relative: 7 %
Neutro Abs: 4.1 K/uL (ref 1.7–7.7)
Neutrophils Relative %: 70 %
Platelets: 240 K/uL (ref 150–400)
RBC: 3.98 MIL/uL (ref 3.87–5.11)
RDW: 15.5 % (ref 11.5–15.5)
WBC: 5.9 K/uL (ref 4.0–10.5)
nRBC: 0 % (ref 0.0–0.2)

## 2024-09-15 LAB — PRO BRAIN NATRIURETIC PEPTIDE: Pro Brain Natriuretic Peptide: 772 pg/mL — ABNORMAL HIGH (ref <300.0)

## 2024-09-15 LAB — D-DIMER, QUANTITATIVE: D-Dimer, Quant: 0.34 ug{FEU}/mL (ref 0.00–0.50)

## 2024-09-15 NOTE — Telephone Encounter (Signed)
 Pt only wants advice from you after being offered appt with Dawn Fowler tomorrow

## 2024-09-15 NOTE — Telephone Encounter (Signed)
 FYI Only or Action Required?: Action required by provider: clinical question for provider.  Patient is followed in Pulmonology for Restrictive lung disease , last seen on 07/28/2024 by [name], MD.  Called Nurse Triage reporting No chief complaint on file..  Symptoms began about a month ago.  Interventions attempted: Other: increase [place] to 10 -15 L O2.  Symptoms are: gradually worsening.  Triage Disposition: No disposition on file.  Patient/caregiver understands and will follow disposition?:   E2C2 Pulmonary Triage - Initial Assessment Questions "Chief Complaint (e.g., cough, sob, wheezing, fever, chills, sweat or additional symptoms) *Go to specific symptom protocol after initial questions. SOB w/ exertion  "How long have symptoms been present?" Month and worsening  Have you tested for COVID or Flu? Note: If not, ask patient if a home test can be taken. If so, instruct patient to call back for positive results. No  MEDICINES:   "Have you used any OTC meds to help with symptoms?" No If yes, ask "What medications?" na  "Have you used your inhalers/maintenance medication?" No If yes, "What medications?" na  If inhaler, ask "How many puffs and how often?" Note: Review instructions on medication in the chart. na  OXYGEN: "Do you wear supplemental oxygen?" Yes If yes, "How many liters are you supposed to use?" 5  "Do you monitor your oxygen levels?" Yes If yes, What is your reading (oxygen level) today? 94-95 at rest with 5L Dawn Fowler 70's with exertion 10-15L [id number]  What is your usual oxygen saturation reading?  (Note: Pulmonary O2 sats should be 90% or greater) 94-95  Copied from CRM #[id number]. Topic: Clinical - Red Word Triage >> Sep 15, 2024  3:33 PM Dawn Fowler wrote: Red Word that prompted transfer to Nurse Triage: Oxygen dropping periodically with any movement, weakness in limbs Reason for Disposition  [1] MODERATE longstanding difficulty breathing (e.g.,  speaks in phrases, SOB even at rest, pulse 100-120) AND [2] SAME as normal  Answer Assessment - Initial Assessment Questions 1. RESPIRATORY STATUS: Describe your breathing? (e.g., wheezing, shortness of breath, unable to speak, severe coughing)      Coughing spells, cough spells even with CPAP, sleep study 10/15/2024 2. ONSET: When did this breathing problem begin?      Month and worsening 3. PATTERN Does the difficult breathing come and go, or has it been constant since it started?      Constant with movement 4. SEVERITY: How bad is your breathing? (e.g., mild, moderate, severe)      Moderate to severe 5. RECURRENT SYMPTOM: Have you had difficulty breathing before? If Yes, ask: When was the last time? and What happened that time?      yes 6. CARDIAC HISTORY: Do you have any history of heart disease? (e.g., heart attack, angina, bypass surgery, angioplasty)      na 7. LUNG HISTORY: Do you have any history of lung disease?  (e.g., pulmonary embolus, asthma, emphysema)     yes 8. CAUSE: What do you think is causing the breathing problem?      unknown 9. OTHER SYMPTOMS: Do you have any other symptoms? (e.g., chest pain, cough, dizziness, fever, runny nose)     Cough, lightheadedness and dizziness,  10. O2 SATURATION MONITOR:  Do you use an oxygen saturation monitor (pulse oximeter) at home? If Yes, ask: What is your reading (oxygen level) today? What is your usual oxygen saturation reading? (e.g., 95%)       94O2 @ 5 [place] sitting but with movement drops  in 76-70 fast 11. PREGNANCY: Is there any chance you are pregnant? When was your last menstrual period?       na 12. TRAVEL: Have you traveled out of the country in the last month? (e.g., travel history, exposures)       na  Placed Face mask with bag at therapy last.  Even at house oxygen drops fast with movement and pt's extremities feels like weakness/heaviness and when oxygen gets low.  White mucous phlegm  everyday.  At therapy oxygen [place] is up to 10-15L.  Pt states at rest her O2 levels remain around 94-95%.  Offered pt appt to be seen tomorrow by another pulm provider however pt states she would like to be seen or discuss these issues with her pulm provider: pt's pulm provider does not have any opening until 2nd week of 09/2024: therefore will send note to pulm provider.  Protocols used: Breathing Difficulty-A-AH

## 2024-09-15 NOTE — ED Notes (Signed)
 Patient desat to 86-88% on 4LNC. RN attempted to bump o2 to [facility],  o2sat maintaining to 92-94%.

## 2024-09-15 NOTE — ED Triage Notes (Signed)
 Patient c/o dizziness and low o2 at home. Patient report her o2 dropped to 70's when moving even with 6LNC at home. Patient states she need to increase o2 to 10LNC at times. Pulmonologist wants patient to be seen for further evaluation. Patient denies chest pain. Patient denies N/V.

## 2024-09-15 NOTE — Telephone Encounter (Signed)
 Spoke with pt and She is currently on 4-6L and at 84%.  She wants to wait until she can get an appt tomorrow but I advised her she could not wait until tomorrow. She and caretaker asked why it had to be now as she has been like this for on month. I told her it should not wait any longer she needs to proceed to ED. She understood the need for going and will go per our converstaion

## 2024-09-15 NOTE — Telephone Encounter (Signed)
 I am out of the office this week.  For an increase in O2 from 5L to 10-15L, patient should be advised to go to the ED or triaged to doctor of the day

## 2024-09-15 NOTE — ED Provider Notes (Signed)
 [facility] Fowler EMERGENCY DEPARTMENT AT [facility] Provider Note   CSN: [id number] Arrival date & time: 09/15/24  1753     Patient presents with: Low O2 and Dizziness   Dawn Fowler is a 58 y.o. female who presents to the emergency department with a chief complaint of hypoxia.  Patient wears 4 L of nasal cannula oxygen at baseline and is supposed to increase up to 10 L whenever at physical therapy.  Patient states that lately they have been having to increase her nasal cannula oxygen up to 15 L so that she does not decrease her oxygen saturations.  Denies acute infectious symptoms like fever, chills.  Denies known sick contacts.  Patient does have a cough.  Denies chest pain, but does appreciate exertional shortness of breath.  Patient also appreciates dizziness however this is only whenever her oxygen saturation levels dropped.  Patient has been monitoring her oxygen levels at home and states that with exertion on her normal 4 L her oxygen will drop to the mid 70s on exertion.  Past medical history significant for diastolic heart failure, restrictive lung disease, pulmonary hypertension, CKD stage III, DVT on Eliquis , etc.  Patient states that she has been compliant with her Eliquis  therapy and has not missed any doses. Patient states she has reached out to pulmonology but she cannot be seen outpatient until December.     Dizziness      Prior to Admission medications   Medication Sig Start Date End Date Taking? Authorizing Provider  acetaminophen  (TYLENOL ) 500 MG tablet Take 1,000 mg by mouth every 6 (six) hours as needed for moderate pain or headache.    [provider]  ADEMPAS  1.5 MG TABS Take 1.5 mg by mouth 3 (three) times daily. Patient taking differently: Take 2 mg by mouth 3 (three) times daily. 07/15/24   [provider]  albuterol  (VENTOLIN  HFA) 108 (90 Base) MCG/ACT inhaler Inhale 2 puffs into the lungs every 6 (six) hours as needed for wheezing or  shortness of breath. 05/09/23   [name], MD  allopurinol  (ZYLOPRIM ) 100 MG tablet TAKE 1 TABLET(100 MG) BY MOUTH DAILY 01/22/23   Rolan Ezra RAMAN, MD  amLODipine  (NORVASC ) 5 MG tablet Take 1 tablet (5 mg total) by mouth daily. 05/13/24 08/13/25  Rolan Ezra RAMAN, MD  apixaban  (ELIQUIS ) 5 MG TABS tablet Take 1 tablet (5 mg total) by mouth 2 (two) times daily. 10/18/22   Rolan Ezra RAMAN, MD  Cholecalciferol  (VITAMIN D3) 50 MCG (2000 UT) capsule Take 2,000 Units by mouth in the morning.    [provider]  colchicine 0.6 MG tablet Take 0.6 mg by mouth daily as needed (gout flare).    [provider]  diclofenac Sodium (VOLTAREN) 1 % GEL Apply 2 g topically 3 (three) times daily as needed (pain.).    [provider]  empagliflozin  (JARDIANCE ) 10 MG TABS tablet Take 1 tablet (10 mg total) by mouth daily. 05/28/24   Rolan Ezra RAMAN, MD  famotidine  (PEPCID ) 20 MG tablet Take 20 mg by mouth 2 (two) times daily.    [provider]  fluticasone  furoate-vilanterol (BREO ELLIPTA ) 200-25 MCG/ACT AEPB Inhale 1 puff into the lungs daily. 07/15/24   [name], MD  gabapentin  (NEURONTIN ) 100 MG capsule Take 100 mg by mouth daily as needed (Pain).    [provider]  ipratropium-albuterol  (DUONEB) 0.5-2.5 (3) MG/3ML SOLN INHALE THE CONTENTS OF 1 VIAL VIA NEBULIZER EVERY 6 HOURS AS NEEDED FOR WHEEZING  OR SHORTNESS OF BREATH 12/05/22   Cobb, Comer GAILS, NP  losartan  (COZAAR ) 25 MG tablet Take 1 tablet (25 mg total) by mouth daily. 05/13/24   Rolan Ezra RAMAN, MD  macitentan  (OPSUMIT ) 10 MG tablet Take 1 tablet (10 mg total) by mouth daily. 12/13/22   McLean, Dalton S, MD  OXYGEN Inhale 4-8 L into the lungs continuous.    [provider]  pantoprazole  (PROTONIX ) 40 MG tablet Take 1 tablet (40 mg total) by mouth daily. 07/18/24   Rolan Ezra RAMAN, MD  Riociguat  (ADEMPAS ) 0.5 MG TABS Take 0.5 mg by mouth in the morning, at noon, and at bedtime. Patient not  taking: Reported on 08/22/2024 05/13/24   Rolan Ezra RAMAN, MD  Riociguat  (ADEMPAS ) 2 MG TABS Take 1 mg by mouth in the morning, at noon, and at bedtime.    [provider]  Selexipag  (UPTRAVI ) 800 MCG TABS Take 1 tablet (800 mcg total) by mouth 2 (two) times daily. 09/18/23   Rolan Ezra RAMAN, MD  sertraline  (ZOLOFT ) 50 MG tablet Take 1 tablet (50 mg total) by mouth 2 (two) times daily. 02/15/23   Rolan Ezra RAMAN, MD  spironolactone  (ALDACTONE ) 25 MG tablet TAKE 1/2 TABLET(12.5 MG) BY MOUTH DAILY 05/28/24   McLean, Dalton S, MD  tirzepatide  (ZEPBOUND ) 7.5 MG/0.5ML Pen Inject 7.5 mg into the skin once a week. 08/13/24   [place], Harlene HERO, FNP  torsemide  (DEMADEX ) 20 MG tablet Take 2 tablets (40 mg total) by mouth daily. 05/28/24   Rolan Ezra RAMAN, MD    Allergies: Adhesive [tape], Latex, Succinylcholine, and Tizanidine    Review of Systems  Neurological:  Positive for dizziness.    Updated Vital Signs BP 133/85 (BP Location: Right Arm)   Pulse 80   Temp 97.7 F (36.5 C) (Oral)   Resp 18   LMP  (LMP Unknown) Comment: pmb last 2-3 months  SpO2 94%   Physical Exam Vitals and nursing note reviewed.  Constitutional:      General: She is awake. She is not in acute distress.    Appearance: Normal appearance. She is not ill-appearing, toxic-appearing or diaphoretic.     Comments: Patient on 4L [place] O2, oxygen saturation 92%, talking in full sentences not in respiratory distress  HENT:     Head: Normocephalic and atraumatic.  Eyes:     General: No scleral icterus. Neck:     Comments: No JVD Cardiovascular:     Rate and Rhythm: Normal rate and regular rhythm.  Pulmonary:     Effort: Pulmonary effort is normal. No respiratory distress.     Breath sounds: No wheezing, rhonchi or rales.  Musculoskeletal:     Right lower leg: No edema.     Left lower leg: No edema.  Skin:    General: Skin is warm.     Capillary Refill: Capillary refill takes less than 2 seconds.  Neurological:      General: No focal deficit present.     Mental Status: She is alert and oriented to person, place, and time.  Psychiatric:        Mood and Affect: Mood normal.        Behavior: Behavior normal. Behavior is cooperative.     (all labs ordered are listed, but only abnormal results are displayed) Labs Reviewed  COMPREHENSIVE METABOLIC PANEL WITH GFR - Abnormal; Notable for the following components:      Result Value   Glucose, Bld 102 (*)    BUN 34 (*)  Creatinine, Ser 2.21 (*)    GFR, Estimated 25 (*)    All other components within normal limits  PRO BRAIN NATRIURETIC PEPTIDE - Abnormal; Notable for the following components:   Pro Brain Natriuretic Peptide 772.0 (*)    All other components within normal limits  RESP PANEL BY RT-PCR (RSV, FLU A&B, COVID)  RVPGX2  CBC WITH DIFFERENTIAL/PLATELET  D-DIMER, QUANTITATIVE    EKG: EKG Interpretation Date/Time:  Monday September 15 2024 20:53:07 EST Ventricular Rate:  79 PR Interval:  191 QRS Duration:  108 QT Interval:  420 QTC Calculation: 482 R Axis:   48  Text Interpretation: Sinus rhythm Borderline low voltage, extremity leads No significant change since last tracing Confirmed by Randol Simmonds [phone]) on 09/15/2024 10:49:12 PM  Radiology: [facility] Chest Portable 1 View Result Date: 09/15/2024 CLINICAL DATA:  Shortness of breath EXAM: PORTABLE CHEST 1 VIEW COMPARISON:  Chest x-ray 11/07/2021. FINDINGS: Cardiac silhouette is enlarged. There central pulmonary vascular congestion. There is no lung consolidation, pleural effusion or pneumothorax. No acute fractures are seen. IMPRESSION: Cardiomegaly with central pulmonary vascular congestion. Electronically Signed   By: Greig Pique M.D.   On: 09/15/2024 21:37     Procedures   Medications Ordered in the ED - No data to display  Clinical Course as of 09/16/24 0118  Mon Sep 15, 2024  2341 Pulmonology coming to see [CH]    Clinical Course User Index [CH] Caidence Kaseman, [place] FALCON, PA-C                                  Medical Decision Making Amount and/or Complexity of Data Reviewed Labs: ordered. Radiology: ordered.   Patient presents to the ED for concern of worsening hypoxia, this involves an extensive number of treatment options, and is a complaint that carries with it a high risk of complications and morbidity.  The differential diagnosis includes pneumonia, pneumothorax, pulmonary embolism, ongoing sequela of pulmonary hypertension, heart failure exacerbation, etc.   Co morbidities that complicate the patient evaluation  diastolic heart failure, restrictive lung disease, pulmonary hypertension, CKD stage III, DVT on Eliquis    Additional history obtained:  Additional history obtained from pulmonology notes and communications back-and-forth with office, I can see where patient reached out due to new increased oxygen needed physical therapy and wearing new nasal cannula oxygen mask was ordered   Lab Tests:  I Ordered, and personally interpreted labs.  The pertinent results include: CBC reassuring with no elevated white blood cell count and stable hemoglobin, CMP consistent with history of chronic kidney disease, D-dimer not elevated, proBNP 772, respiratory panel negative   Imaging Studies ordered:  I ordered imaging studies including chest x-ray I independently visualized and interpreted imaging which showed cardiomegaly with central pulmonary vascular congestion I agree with the radiologist interpretation   Cardiac Monitoring:  The patient was maintained on a cardiac monitor.  I personally viewed and interpreted the cardiac monitored which showed an underlying rhythm of: Sinus rhythm   Test Considered:  CT PE scan: Patient unable to tolerate due to chronic kidney disease with reduced GFR at 25, D-dimer test completed instead which was not elevated, low clinical suspicion for pulmonary embolism as patient has been compliant with her outpatient Eliquis   therapy and states she has not missed any doses   Critical Interventions:  None   Consultations Obtained:  I requested consultation with the pulmonology team,  and discussed lab and imaging findings  as well as pertinent plan - they recommend: *Dr. Harlene Na states that she will see the patient, and disposition will be determined pending her evaluation*   Problem List / ED Course:  58 year old female, vital signs stable, presents to the emergency department for chief complaint of hypoxia, patient has extensive past medical history including diastolic heart failure, pulmonary hypertension and is on 4 L of nasal cannula oxygen at rest at baseline, patient appreciates recent increase at PT up to 15 L due to hypoxia, denies acute infectious symptoms On physical exam patient overall well-appearing, oxygen saturation 90 to 92% on baseline 4 L, this decreased down to mid 80s when sitting up in bed Will obtain baseline labs and chest x-ray as well as BNP Overall lab work here in the ED reassuring however due to increased O2 requirement at PT do need to rule out possibility of PE however unlikely as patient has been compliant with Eliquis  D-dimer not elevated, low clinical suspicion for PE [facility] patient with nursing staff, patient desaturated down to low to mid 80's while on baseline 4L of O2 [place], this improved to mid 90's when increased to [employer] with Dr. Harlene Na with pulmonology who states that she will come and see the patient, disposition will be pending her evaluation Patient signed out to [facility] PA-C pending pulmonology evaluation and recommendations, she assumes care over this patient including further work-up, evaluation, and disposition So far in work-up no evidence of pneumonia, PE, or acute heart failure exacerbation leading to symptoms    Dispostion:  Patient signed out to [facility] PA-C pending pulmonology evaluation and recommendations, she assumes  care over this patient including further work-up, evaluation, and disposition       Final diagnoses:  Acute on chronic respiratory failure with hypoxia [facility])    ED Discharge Orders     None          [place] L[place] F, PA-C 09/16/24 0118    Randol Simmonds, MD 09/16/24 1058

## 2024-09-16 ENCOUNTER — Encounter (HOSPITAL_COMMUNITY): Payer: Self-pay | Admitting: Internal Medicine

## 2024-09-16 ENCOUNTER — Telehealth (HOSPITAL_COMMUNITY): Payer: Self-pay

## 2024-09-16 ENCOUNTER — Encounter (HOSPITAL_COMMUNITY): Admission: RE | Admit: 2024-09-16 | Source: Ambulatory Visit

## 2024-09-16 DIAGNOSIS — I2722 Pulmonary hypertension due to left heart disease: Secondary | ICD-10-CM | POA: Diagnosis present

## 2024-09-16 DIAGNOSIS — E66813 Obesity, class 3: Secondary | ICD-10-CM | POA: Diagnosis present

## 2024-09-16 DIAGNOSIS — G4733 Obstructive sleep apnea (adult) (pediatric): Secondary | ICD-10-CM | POA: Diagnosis present

## 2024-09-16 DIAGNOSIS — Z86718 Personal history of other venous thrombosis and embolism: Secondary | ICD-10-CM | POA: Diagnosis not present

## 2024-09-16 DIAGNOSIS — K219 Gastro-esophageal reflux disease without esophagitis: Secondary | ICD-10-CM | POA: Diagnosis present

## 2024-09-16 DIAGNOSIS — J810 Acute pulmonary edema: Secondary | ICD-10-CM

## 2024-09-16 DIAGNOSIS — I5033 Acute on chronic diastolic (congestive) heart failure: Secondary | ICD-10-CM | POA: Diagnosis present

## 2024-09-16 DIAGNOSIS — J9620 Acute and chronic respiratory failure, unspecified whether with hypoxia or hypercapnia: Secondary | ICD-10-CM | POA: Diagnosis present

## 2024-09-16 DIAGNOSIS — Z7901 Long term (current) use of anticoagulants: Secondary | ICD-10-CM | POA: Diagnosis not present

## 2024-09-16 DIAGNOSIS — Z825 Family history of asthma and other chronic lower respiratory diseases: Secondary | ICD-10-CM | POA: Diagnosis not present

## 2024-09-16 DIAGNOSIS — I2723 Pulmonary hypertension due to lung diseases and hypoxia: Secondary | ICD-10-CM | POA: Diagnosis present

## 2024-09-16 DIAGNOSIS — Z79899 Other long term (current) drug therapy: Secondary | ICD-10-CM | POA: Diagnosis not present

## 2024-09-16 DIAGNOSIS — Z7985 Long-term (current) use of injectable non-insulin antidiabetic drugs: Secondary | ICD-10-CM | POA: Diagnosis not present

## 2024-09-16 DIAGNOSIS — J984 Other disorders of lung: Secondary | ICD-10-CM | POA: Diagnosis not present

## 2024-09-16 DIAGNOSIS — I5082 Biventricular heart failure: Secondary | ICD-10-CM | POA: Diagnosis present

## 2024-09-16 DIAGNOSIS — Z6841 Body Mass Index (BMI) 40.0 and over, adult: Secondary | ICD-10-CM | POA: Diagnosis not present

## 2024-09-16 DIAGNOSIS — I5030 Unspecified diastolic (congestive) heart failure: Secondary | ICD-10-CM | POA: Diagnosis not present

## 2024-09-16 DIAGNOSIS — N179 Acute kidney failure, unspecified: Secondary | ICD-10-CM

## 2024-09-16 DIAGNOSIS — M654 Radial styloid tenosynovitis [de Quervain]: Secondary | ICD-10-CM | POA: Insufficient documentation

## 2024-09-16 DIAGNOSIS — R0609 Other forms of dyspnea: Secondary | ICD-10-CM | POA: Diagnosis not present

## 2024-09-16 DIAGNOSIS — I130 Hypertensive heart and chronic kidney disease with heart failure and stage 1 through stage 4 chronic kidney disease, or unspecified chronic kidney disease: Secondary | ICD-10-CM | POA: Diagnosis present

## 2024-09-16 DIAGNOSIS — Z7984 Long term (current) use of oral hypoglycemic drugs: Secondary | ICD-10-CM | POA: Diagnosis not present

## 2024-09-16 DIAGNOSIS — Z8616 Personal history of COVID-19: Secondary | ICD-10-CM | POA: Diagnosis not present

## 2024-09-16 DIAGNOSIS — I272 Pulmonary hypertension, unspecified: Secondary | ICD-10-CM | POA: Diagnosis not present

## 2024-09-16 DIAGNOSIS — F339 Major depressive disorder, recurrent, unspecified: Secondary | ICD-10-CM | POA: Diagnosis present

## 2024-09-16 DIAGNOSIS — Z86711 Personal history of pulmonary embolism: Secondary | ICD-10-CM | POA: Diagnosis not present

## 2024-09-16 DIAGNOSIS — E785 Hyperlipidemia, unspecified: Secondary | ICD-10-CM | POA: Diagnosis present

## 2024-09-16 DIAGNOSIS — I10 Essential (primary) hypertension: Secondary | ICD-10-CM | POA: Diagnosis not present

## 2024-09-16 DIAGNOSIS — N1832 Chronic kidney disease, stage 3b: Secondary | ICD-10-CM | POA: Diagnosis present

## 2024-09-16 DIAGNOSIS — Z9981 Dependence on supplemental oxygen: Secondary | ICD-10-CM | POA: Diagnosis not present

## 2024-09-16 DIAGNOSIS — M109 Gout, unspecified: Secondary | ICD-10-CM | POA: Diagnosis present

## 2024-09-16 DIAGNOSIS — Z8249 Family history of ischemic heart disease and other diseases of the circulatory system: Secondary | ICD-10-CM | POA: Diagnosis not present

## 2024-09-16 DIAGNOSIS — J9621 Acute and chronic respiratory failure with hypoxia: Secondary | ICD-10-CM | POA: Diagnosis present

## 2024-09-16 LAB — HEPATIC FUNCTION PANEL
ALT: 15 U/L (ref 0–44)
AST: 20 U/L (ref 15–41)
Albumin: 4.5 g/dL (ref 3.5–5.0)
Alkaline Phosphatase: 97 U/L (ref 38–126)
Bilirubin, Direct: 0.1 mg/dL (ref 0.0–0.2)
Indirect Bilirubin: 0.2 mg/dL — ABNORMAL LOW (ref 0.3–0.9)
Total Bilirubin: 0.3 mg/dL (ref 0.0–1.2)
Total Protein: 7.3 g/dL (ref 6.5–8.1)

## 2024-09-16 LAB — CBC
HCT: 40.6 % (ref 36.0–46.0)
Hemoglobin: 12.8 g/dL (ref 12.0–15.0)
MCH: 31 pg (ref 26.0–34.0)
MCHC: 31.5 g/dL (ref 30.0–36.0)
MCV: 98.3 fL (ref 80.0–100.0)
Platelets: 274 K/uL (ref 150–400)
RBC: 4.13 MIL/uL (ref 3.87–5.11)
RDW: 15.6 % — ABNORMAL HIGH (ref 11.5–15.5)
WBC: 6.2 K/uL (ref 4.0–10.5)
nRBC: 0 % (ref 0.0–0.2)

## 2024-09-16 LAB — BASIC METABOLIC PANEL WITH GFR
Anion gap: 13 (ref 5–15)
BUN: 36 mg/dL — ABNORMAL HIGH (ref 6–20)
CO2: 23 mmol/L (ref 22–32)
Calcium: 10 mg/dL (ref 8.9–10.3)
Chloride: 105 mmol/L (ref 98–111)
Creatinine, Ser: 2.12 mg/dL — ABNORMAL HIGH (ref 0.44–1.00)
GFR, Estimated: 27 mL/min — ABNORMAL LOW (ref >60)
Glucose, Bld: 90 mg/dL (ref 70–99)
Potassium: 3.9 mmol/L (ref 3.5–5.1)
Sodium: 141 mmol/L (ref 135–145)

## 2024-09-16 LAB — CBG MONITORING, ED: Glucose-Capillary: 94 mg/dL (ref 70–99)

## 2024-09-16 LAB — MAGNESIUM: Magnesium: 2.3 mg/dL (ref 1.7–2.4)

## 2024-09-16 MED ORDER — SODIUM CHLORIDE 0.9% FLUSH
3.0000 mL | Freq: Two times a day (BID) | INTRAVENOUS | Status: DC
  Administered 2024-09-16 – 2024-09-18 (×6): 3 mL via INTRAVENOUS

## 2024-09-16 MED ORDER — FUROSEMIDE 10 MG/ML IJ SOLN
40.0000 mg | Freq: Once | INTRAMUSCULAR | Status: AC
  Administered 2024-09-16: 40 mg via INTRAVENOUS
  Filled 2024-09-16: qty 4

## 2024-09-16 MED ORDER — ALLOPURINOL 100 MG PO TABS
100.0000 mg | ORAL_TABLET | Freq: Every day | ORAL | Status: DC
  Administered 2024-09-16 – 2024-09-18 (×3): 100 mg via ORAL
  Filled 2024-09-16 (×3): qty 1

## 2024-09-16 MED ORDER — DICLOFENAC SODIUM 1 % EX GEL
2.0000 g | Freq: Three times a day (TID) | CUTANEOUS | Status: DC | PRN
  Filled 2024-09-16: qty 100

## 2024-09-16 MED ORDER — ACETAMINOPHEN 325 MG PO TABS
650.0000 mg | ORAL_TABLET | Freq: Four times a day (QID) | ORAL | Status: DC | PRN
  Administered 2024-09-16 – 2024-09-17 (×2): 650 mg via ORAL
  Filled 2024-09-16 (×2): qty 2

## 2024-09-16 MED ORDER — IPRATROPIUM-ALBUTEROL 0.5-2.5 (3) MG/3ML IN SOLN: 3.0000 mL | Freq: Four times a day (QID) | RESPIRATORY_TRACT | Status: DC | PRN

## 2024-09-16 MED ORDER — LOSARTAN POTASSIUM 25 MG PO TABS
25.0000 mg | ORAL_TABLET | Freq: Every day | ORAL | Status: DC
  Administered 2024-09-16 – 2024-09-18 (×3): 25 mg via ORAL
  Filled 2024-09-16 (×3): qty 1

## 2024-09-16 MED ORDER — SENNOSIDES-DOCUSATE SODIUM 8.6-50 MG PO TABS: 1.0000 | ORAL_TABLET | Freq: Every evening | ORAL | Status: DC | PRN

## 2024-09-16 MED ORDER — RIOCIGUAT 1 MG PO TABS
1.0000 mg | ORAL_TABLET | Freq: Three times a day (TID) | ORAL | Status: DC
  Administered 2024-09-17 – 2024-09-18 (×4): 1 mg via ORAL
  Filled 2024-09-16: qty 1

## 2024-09-16 MED ORDER — PANTOPRAZOLE SODIUM 40 MG PO TBEC
40.0000 mg | DELAYED_RELEASE_TABLET | Freq: Every day | ORAL | Status: DC
  Administered 2024-09-16 – 2024-09-18 (×3): 40 mg via ORAL
  Filled 2024-09-16 (×3): qty 1

## 2024-09-16 MED ORDER — MELATONIN 5 MG PO TABS
5.0000 mg | ORAL_TABLET | Freq: Once | ORAL | Status: AC
  Administered 2024-09-16: 5 mg via ORAL
  Filled 2024-09-16: qty 1

## 2024-09-16 MED ORDER — MACITENTAN 10 MG PO TABS
10.0000 mg | ORAL_TABLET | Freq: Every day | ORAL | Status: DC
  Administered 2024-09-16 – 2024-09-18 (×3): 10 mg via ORAL
  Filled 2024-09-16 (×4): qty 1

## 2024-09-16 MED ORDER — SPIRONOLACTONE 12.5 MG HALF TABLET
12.5000 mg | ORAL_TABLET | Freq: Every day | ORAL | Status: DC
  Administered 2024-09-16 – 2024-09-18 (×3): 12.5 mg via ORAL
  Filled 2024-09-16 (×3): qty 1

## 2024-09-16 MED ORDER — HEPARIN SODIUM (PORCINE) 5000 UNIT/ML IJ SOLN
5000.0000 [IU] | Freq: Three times a day (TID) | INTRAMUSCULAR | Status: DC
  Administered 2024-09-16: 5000 [IU] via SUBCUTANEOUS
  Filled 2024-09-16: qty 1

## 2024-09-16 MED ORDER — FLUTICASONE FUROATE-VILANTEROL 200-25 MCG/ACT IN AEPB
1.0000 | INHALATION_SPRAY | Freq: Every day | RESPIRATORY_TRACT | Status: DC
  Administered 2024-09-17 – 2024-09-18 (×2): 1 via RESPIRATORY_TRACT
  Filled 2024-09-16: qty 28

## 2024-09-16 MED ORDER — SELEXIPAG 800 MCG PO TABS
1.0000 | ORAL_TABLET | Freq: Two times a day (BID) | ORAL | Status: DC
  Administered 2024-09-17 – 2024-09-18 (×3): 800 ug via ORAL
  Filled 2024-09-16: qty 1

## 2024-09-16 MED ORDER — MACITENTAN 10 MG PO TABS: 10.0000 mg | ORAL_TABLET | Freq: Every day | ORAL | Status: DC

## 2024-09-16 MED ORDER — TADALAFIL (PAH) 20 MG PO TABS
20.0000 mg | ORAL_TABLET | Freq: Every day | ORAL | Status: DC
  Administered 2024-09-16 – 2024-09-17 (×2): 20 mg via ORAL
  Filled 2024-09-16 (×3): qty 1

## 2024-09-16 MED ORDER — APIXABAN 5 MG PO TABS
5.0000 mg | ORAL_TABLET | Freq: Two times a day (BID) | ORAL | Status: DC
  Administered 2024-09-16 – 2024-09-18 (×5): 5 mg via ORAL
  Filled 2024-09-16 (×5): qty 1

## 2024-09-16 MED ORDER — SERTRALINE HCL 50 MG PO TABS
50.0000 mg | ORAL_TABLET | Freq: Two times a day (BID) | ORAL | Status: DC
  Administered 2024-09-16 – 2024-09-18 (×5): 50 mg via ORAL
  Filled 2024-09-16 (×5): qty 1

## 2024-09-16 MED ORDER — ACETAMINOPHEN 650 MG RE SUPP
650.0000 mg | Freq: Four times a day (QID) | RECTAL | Status: DC | PRN
Start: 2024-09-16 — End: 2024-09-18

## 2024-09-16 MED ORDER — VITAMIN D3 25 MCG (1000 UNIT) PO TABS
2000.0000 [IU] | ORAL_TABLET | Freq: Every morning | ORAL | Status: DC
  Administered 2024-09-16 – 2024-09-18 (×3): 2000 [IU] via ORAL
  Filled 2024-09-16 (×4): qty 2

## 2024-09-16 MED ORDER — EMPAGLIFLOZIN 10 MG PO TABS
10.0000 mg | ORAL_TABLET | Freq: Every day | ORAL | Status: DC
  Administered 2024-09-16 – 2024-09-18 (×3): 10 mg via ORAL
  Filled 2024-09-16 (×3): qty 1

## 2024-09-16 NOTE — Telephone Encounter (Signed)
 Patient c/o for 10:15 PR class, states she has been hospitalized with fluid in her lungs.

## 2024-09-16 NOTE — ED Provider Notes (Signed)
  Accepted handoff at shift change from [place] PA-C. Please see prior provider note for more detail.   Briefly: Patient is 58 y.o. presents to the emergency department with a chief complaint of hypoxia.  Patient wears 4 L of nasal cannula oxygen at baseline and is supposed to increase up to 10 L whenever at physical therapy.  Patient states that lately they have been having to increase her nasal cannula oxygen up to 15 L so that she does not decrease her oxygen saturations.  Denies acute infectious symptoms like fever, chills.  Denies known sick contacts.  Patient does have a cough.  Denies chest pain, but does appreciate exertional shortness of breath.  Patient also appreciates dizziness however this is only whenever her oxygen saturation levels dropped.  Patient has been monitoring her oxygen levels at home and states that with exertion on her normal 4 L her oxygen will drop to the mid 70s on exertion.  Past medical history significant for diastolic heart failure, restrictive lung disease, pulmonary hypertension, CKD stage III, DVT on Eliquis , etc.  Patient states that she has been compliant with her Eliquis  therapy and has not missed any doses. Patient states she has reached out to pulmonology but she cannot be seen outpatient until December.  DDX: concern for  pneumonia, pneumothorax, pulmonary embolism, ongoing sequela of pulmonary hypertension, heart failure exacerbation, etc.   Plan:  - Pulmonology coming to evaluate patient. Dispo pending their consultation. - Pulmonology recommending hospitalist admission for diuresis. I consulted with Hospitalist on-call Dr. Fernand who agrees to admit patient.     Hoy Nidia FALCON, [place] 09/16/24 [place]    Randol Simmonds, MD 09/16/24 1058

## 2024-09-16 NOTE — ED Notes (Signed)
 Patient ambulated to the bathroom, on 6 L [place]. Patient stayed consistently at 97% with 6L of oxygen

## 2024-09-16 NOTE — Consult Note (Signed)
 NAME:  Dawn Fowler, MRN:  [id number], DOB:  [birth year], LOS: 0 ADMISSION DATE:  09/15/2024, CONSULTATION DATE:  09/16/24 REFERRING MD:  EDP, CHIEF COMPLAINT:  acute on chronic hypoxic resp failure   History of Present Illness:  58 yo female presented at recommendation of primary pulmonology office when she notified the clinic that her oxygen requirement appears to have escalated. Pt states that for the past month she has been increasingly sob with movement and requiring increased oxygen with any mobility. She is on 4L at baseline but having to increase to 10-15 per per report. She states her oxygen level falls to 70's with any movement. She feels heaviness and weakness in her body when she has these episodes, denies dizziness or ha/cp. Pt has no known recent sick contacts. No fever/chills. No n/v/d. Pt has had cough for same duration in the morning when she awakens but reports the sputum is typically clear to slight white. It improves after she is moving around during the day.   Of note pt does have h/o dvt and is on chronic eliquis . She endorses consistency with all her medications and certainly not missing a dose of eliquis .   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 rate 18, SpO2 94%.       No intake or output data in the 24 hours ending 09/16/24 0420 There were no vitals filed for this visit.  Examination: General: nad, reclining comfortably in bed without resp distress HENT: ncat, eomi, perrla, mmmp Lungs: rales bilaterally posteriorly   Cardiovascular: rrr Abdomen: soft nt/nd bs+ Extremities: no c/c/e Neuro: no focal deficits GU: deferred  Resolved problem list   Assessment and Plan  Acute on chronic hypoxemic resp failure Pulm edema Pulm htn HFpEF Chronic RHF CKD3b with aki Dvt on eliquis  Restrictive lung disease -titrate supplemental oxygen to sat >92% -give 40mg  lasix  iv at this time -redose as needed -will likely need adjustment of home diuretics prior to d/c -rvp for completeness sake -less likely infectious etiology of her increased oxygen requirement -consider repeat echo if no improvement in am, just had one in may -worsening renal indices is concerning and likely contributing to volume overload -cont home meds -will need close f/u with pulmonary as outpt.     Labs   CBC: Recent Labs  Lab 09/15/24 2045  WBC 5.9  NEUTROABS 4.1  HGB 12.4  HCT 39.2  MCV 98.5  PLT 240    Basic Metabolic Panel: Recent Labs  Lab 09/15/24 2045  NA 138  K 3.7  CL 104  CO2 22  GLUCOSE 102*  BUN 34*  CREATININE 2.21*  CALCIUM 9.9   GFR: Estimated Creatinine Clearance: 30.1 mL/min (A) (by C-G formula based on SCr of 2.21 mg/dL (H)). Recent Labs  Lab 09/15/24 2045  WBC 5.9    Liver Function Tests: Recent Labs  Lab 09/15/24 2045  AST 19  ALT 12  ALKPHOS 95  BILITOT 0.4  PROT 6.9  ALBUMIN  4.2   No results for input(s): LIPASE, AMYLASE in the last 168 hours. No results for  input(s): AMMONIA in the last 168 hours.  ABG    Component Value Date/Time   HCO3 21.8 07/18/2024 0812   HCO3 22.4 07/18/2024 0812   TCO2 23 07/18/2024 0812   TCO2 24 07/18/2024 0812   ACIDBASEDEF 4.0 (H) 07/18/2024 0812   ACIDBASEDEF 4.0 (H) 07/18/2024 0812   O2SAT 69 07/18/2024 0812   O2SAT 68 07/18/2024 0812     Coagulation Profile: No results for input(s): INR, PROTIME in the last 168 hours.  Cardiac Enzymes: No results for input(s): CKTOTAL, CKMB, CKMBINDEX, TROPONINI in the last 168  hours.  HbA1C: Hgb A1c MFr Bld  Date/Time Value Ref Range Status  03/18/2024 10:25 AM 5.3 4.8 - 5.6 % Final    Comment:    (NOTE) Pre diabetes:          5.7%-6.4%  Diabetes:              >6.4%  Glycemic control for   <7.0% adults with diabetes     CBG: No results for input(s): GLUCAP in the last 168 hours.  Review of Systems:   As per HPI  Past Medical History:  She,  has a past medical history of Acute respiratory failure due to COVID-19 [facility]) (10/30/2019), Anxiety, Arthritis, CHF (congestive heart failure) (HCC), CKD (chronic kidney disease), stage III (HCC), Complication of anesthesia, COVID-19 virus detected (11/20/2019), Depression, DVT (deep venous thrombosis) (HCC) (06/09/2020), Dyspnea, Family history of adverse reaction to anesthesia, GERD (gastroesophageal reflux disease), HLD (hyperlipidemia), HTN (hypertension), Pre-diabetes, Pseudocholinesterase deficiency, Pulmonary HTN (HCC), Sleep apnea, and Trichomonas infection.   Surgical History:   Past Surgical History:  Procedure Laterality Date   BREAST REDUCTION SURGERY Bilateral 12/20/2021   Procedure: bilateral breast reduction with free nipple graft;  Surgeon: Marene Sieving, MD;  Location[facility] OR;  Service: Plastics;  Laterality: Bilateral;  4 hours   CARPAL TUNNEL RELEASE Bilateral    CESAREAN SECTION     COLONOSCOPY WITH PROPOFOL  N/A 03/12/2020   Procedure: COLONOSCOPY WITH PROPOFOL ;  Surgeon: Rollin Dover, MD;  Location: [facility] ENDOSCOPY;  Service: Endoscopy;  Laterality: N/A;   DILATATION & CURETTAGE/HYSTEROSCOPY WITH MYOSURE N/A 04/08/2020   Procedure: DILATATION & CURETTAGE/HYSTEROSCOPY WITH MYOSURE;  Surgeon: Lavoie, Marie-Lyne, MD;  Location: [facility] OR;  Service: Gynecology;  Laterality: N/A;  request 8:30am OR start-requested through [employer] for [facility] Gyn requests one hour   HEMOSTASIS CLIP PLACEMENT  03/12/2020   Procedure: HEMOSTASIS CLIP PLACEMENT;  Surgeon: Rollin Dover, MD;  Location: [facility] ENDOSCOPY;   Service: Endoscopy;;   LEG SURGERY Right 1988   metal rod   lumbar surgery     POLYPECTOMY  03/12/2020   Procedure: POLYPECTOMY;  Surgeon: Rollin Dover, MD;  Location: [facility] ENDOSCOPY;  Service: Endoscopy;;   RIGHT HEART CATH N/A 12/18/2019   Procedure: RIGHT HEART CATH;  Surgeon: Rolan Ezra RAMAN, MD;  Location[facility] INVAS[facility] CV LAB;  Service: Cardiovascular;  Laterality: N/A;   RIGHT HEART CATH N/A 07/12/2020   Procedure: RIGHT HEART CATH;  Surgeon: Rolan Ezra RAMAN, MD;  Location[facility] INVAS[facility] CV LAB;  Service: Cardiovascular;  Laterality: N/A;   RIGHT HEART CATH N/A 07/18/2024   Procedure: RIGHT HEART CATH;  Surgeon: Rolan Ezra RAMAN, MD;  Location: [facility] INVASIVE CV LAB;  Service: Cardiovascular;  Laterality: N/A;     Social History:   reports that she has never smoked. She has never used smokeless tobacco. She reports that she does not currently use alcohol. She reports that she does not currently use drugs.   Family History:  Her family history includes Breast cancer in her cousin, cousin and another family member; COPD in her father; Cancer in her mother; Diabetes in her son; Hypertension in her brother. There is no history of Pulmonary Hypertension.   Allergies Allergies  Allergen Reactions   Adhesive [Tape] Hives    Tolerates paper tape   Latex Dermatitis   Succinylcholine Other (See Comments)    Have trouble waking up   Tizanidine Hives     Home Medications  Prior to Admission medications   Medication Sig Start Date End Date Taking? Authorizing Provider  acetaminophen  (TYLENOL ) 500 MG tablet Take 1,000 mg by mouth every 6 (six) hours as needed for moderate pain or headache.    [provider]  ADEMPAS  1.5 MG TABS Take 1.5 mg by mouth 3 (three) times daily. Patient taking differently: Take 2 mg by mouth 3 (three) times daily. 07/15/24   [provider]  albuterol  (VENTOLIN  HFA) 108 (90 Base) MCG/ACT inhaler Inhale 2 puffs into the lungs every 6 (six) hours as  needed for wheezing or shortness of breath. 05/09/23   [name], MD  allopurinol  (ZYLOPRIM ) 100 MG tablet TAKE 1 TABLET(100 MG) BY MOUTH DAILY 01/22/23   Rolan Ezra RAMAN, MD  amLODipine  (NORVASC ) 5 MG tablet Take 1 tablet (5 mg total) by mouth daily. 05/13/24 08/13/25  Rolan Ezra RAMAN, MD  apixaban  (ELIQUIS ) 5 MG TABS tablet Take 1 tablet (5 mg total) by mouth 2 (two) times daily. 10/18/22   Rolan Ezra RAMAN, MD  Cholecalciferol  (VITAMIN D3) 50 MCG (2000 UT) capsule Take 2,000 Units by mouth in the morning.    [provider]  colchicine 0.6 MG tablet Take 0.6 mg by mouth daily as needed (gout flare).    [provider]  diclofenac Sodium (VOLTAREN) 1 % GEL Apply 2 g topically 3 (three) times daily as needed (pain.).    [provider]  empagliflozin  (JARDIANCE ) 10 MG TABS tablet Take 1 tablet (10 mg total) by mouth daily. 05/28/24   Rolan Ezra RAMAN, MD  famotidine  (PEPCID ) 20 MG tablet Take 20 mg by mouth 2 (two) times daily.    [provider]  fluticasone  furoate-vilanterol (BREO ELLIPTA ) 200-25 MCG/ACT AEPB Inhale 1 puff into the lungs daily. 07/15/24   [name], MD  gabapentin  (NEURONTIN ) 100 MG capsule Take 100 mg by mouth daily as needed (Pain).    [provider]  ipratropium-albuterol  (DUONEB) 0.5-2.5 (3) MG/3ML SOLN INHALE THE CONTENTS OF 1 VIAL VIA NEBULIZER EVERY 6 HOURS AS NEEDED FOR WHEEZING OR SHORTNESS OF BREATH 12/05/22   Cobb, Comer GAILS, NP  losartan  (COZAAR ) 25 MG tablet Take 1 tablet (25 mg total) by mouth daily. 05/13/24   Rolan Ezra RAMAN, MD  macitentan  (OPSUMIT ) 10 MG tablet Take 1 tablet (10 mg total) by mouth daily. 12/13/22   McLean, Dalton S, MD  OXYGEN Inhale 4-8 L into the lungs continuous.    [provider]  pantoprazole  (PROTONIX ) 40 MG tablet Take 1 tablet (40 mg total) by mouth daily. 07/18/24   Rolan Ezra RAMAN, MD  Riociguat  (ADEMPAS ) 0.5 MG TABS Take 0.5 mg by mouth in the morning, at noon, and at  bedtime. Patient not taking: Reported on 08/22/2024 05/13/24   Rolan Ezra RAMAN, MD  Riociguat  (ADEMPAS ) 2 MG TABS Take 1 mg by mouth in the morning, at noon, and at bedtime.    [provider]  Selexipag  (UPTRAVI ) 800 MCG TABS Take 1 tablet (800 mcg total)  by mouth 2 (two) times daily. 09/18/23   Rolan Ezra RAMAN, MD  sertraline  (ZOLOFT ) 50 MG tablet Take 1 tablet (50 mg total) by mouth 2 (two) times daily. 02/15/23   Rolan Ezra RAMAN, MD  spironolactone  (ALDACTONE ) 25 MG tablet TAKE 1/2 TABLET(12.5 MG) BY MOUTH DAILY 05/28/24   McLean, Dalton S, MD  tirzepatide  (ZEPBOUND ) 7.5 MG/0.5ML Pen Inject 7.5 mg into the skin once a week. 08/13/24   Milford, Harlene HERO, FNP  torsemide  (DEMADEX ) 20 MG tablet Take 2 tablets (40 mg total) by mouth daily. 05/28/24   Rolan Ezra RAMAN, MD     Critical care time: 

## 2024-09-16 NOTE — H&P (Signed)
 History and Physical    Patient: Dawn Fowler FMW:[id number] DOB: 1966-09-21 DOA: 09/15/2024 DOS: the patient was seen and examined on 09/16/2024 PCP: [name], MD  Patient coming from: Home  Chief Complaint:  Chief Complaint  Patient presents with   Low O2   Dizziness   HPI: Dawn Fowler is a 58 y.o. female with medical history significant of anxiety, depression, stage III CKD, history of RLE DVT, GERD, hyperlipidemia, hypertension, prediabetes, class III obesity, pseudocholinesterase deficiency, sleep apnea, pulmonary hypertension, acute respiratory failure due to COVID-19, restrictive lung disease who presented to the emergency department due to hypoxia, occasional productive cough of whitish sputum and dizziness.  Her exercise tolerance has decreased significantly.  She stated that they have to increase her O2 requirement all the way to 15 LPM to work with her during pulmonary rehab.  No increase in sodium or fluid intake.  No chest pain, palpitations, diaphoresis, PND, orthopnea or pitting edema of the lower extremities. She denied fever, chills, rhinorrhea, sore throat, wheezing or hemoptysis.   No abdominal pain, nausea, emesis, diarrhea, constipation, melena or hematochezia.  No flank pain, dysuria, frequency or hematuria.  No polyuria, polydipsia, polyphagia or blurred vision.   Lab work: CBC and D-dimer were normal.  proBNP was 772.0 coronavirus, influenza and RSV PCR test was negative.  CMP showed a glucose of 112, BUN 34 and creatinine 2.21 mg/dL (2 months ago creatinine was 2.36 and 4 months ago 1.82 and 1.94 mg/dL).  Imaging: Portable 1 view chest radiograph showing cardiomegaly with central pulmonary vascular congestion.   ED course: Initial vital signs were temperature 98.4 F, pulse 85, respirations 16, BP 127/98 mmHg O2 sat 95% on room air.  Patient received furosemide  40 mg IVP x 1 dose..  Review of Systems: As mentioned in the history of present illness. All other  systems reviewed and are negative.  Past Medical History:  Diagnosis Date   Acute respiratory failure due to COVID-19 [facility]) 10/30/2019   Anxiety    Arthritis    CHF (congestive heart failure) (HCC)    CKD (chronic kidney disease), stage III (HCC)    Complication of anesthesia    hard to wake up after general anesthesia - had trouble waking up after succinylcholine (consider possibility of pseudocholinesterase deficiency)   COVID-19 virus detected 11/20/2019   Tested positive for Covid in December/2020 Required hospitalization in December/2020 through January/2021 Received Redemsivir   Depression    DVT (deep venous thrombosis) (HCC) 06/09/2020   RLE   Dyspnea    Family history of adverse reaction to anesthesia    hard to wake up after general anesthesia   GERD (gastroesophageal reflux disease)    HLD (hyperlipidemia)    HTN (hypertension)    Pre-diabetes    no meds   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; 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, although unsure if she had confirmation testing done   Pulmonary HTN (HCC)    Sleep apnea    on CPAP   Trichomonas infection    Past Surgical History:  Procedure Laterality Date   BREAST REDUCTION SURGERY Bilateral 12/20/2021   Procedure: bilateral breast reduction with free nipple graft;  Surgeon: Marene Sieving, MD;  Location: [facility] OR;  Service: Plastics;  Laterality: Bilateral;  4 hours   CARPAL TUNNEL RELEASE Bilateral    CESAREAN SECTION     COLONOSCOPY WITH PROPOFOL  N/A 03/12/2020   Procedure: COLONOSCOPY WITH PROPOFOL ;  Surgeon: Rollin Dover, MD;  Location: [facility] ENDOSCOPY;  Service: Endoscopy;  Laterality: N/A;   DILATATION & CURETTAGE/HYSTEROSCOPY WITH MYOSURE N/A 04/08/2020   Procedure: DILATATION & CURETTAGE/HYSTEROSCOPY WITH MYOSURE;  Surgeon: Lavoie, Marie-Lyne, MD;  Location: [facility] OR;  Service: Gynecology;  Laterality: N/A;  request 8:30am OR start-requested through [facility] for  [facility] Gyn requests one hour   HEMOSTASIS CLIP PLACEMENT  03/12/2020   Procedure: HEMOSTASIS CLIP PLACEMENT;  Surgeon: Rollin Dover, MD;  Location: [facility] ENDOSCOPY;  Service: Endoscopy;;   LEG SURGERY Right 1988   metal rod   lumbar surgery     POLYPECTOMY  03/12/2020   Procedure: POLYPECTOMY;  Surgeon: Rollin Dover, MD;  Location: [facility] ENDOSCOPY;  Service: Endoscopy;;   RIGHT HEART CATH N/A 12/18/2019   Procedure: RIGHT HEART CATH;  Surgeon: Rolan Ezra RAMAN, MD;  Location: [facility] INVASIVE CV LAB;  Service: Cardiovascular;  Laterality: N/A;   RIGHT HEART CATH N/A 07/12/2020   Procedure: RIGHT HEART CATH;  Surgeon: Rolan Ezra RAMAN, MD;  Location: [facility] INVASIVE CV LAB;  Service: Cardiovascular;  Laterality: N/A;   RIGHT HEART CATH N/A 07/18/2024   Procedure: RIGHT HEART CATH;  Surgeon: Rolan Ezra RAMAN, MD;  Location: [facility] INVASIVE CV LAB;  Service: Cardiovascular;  Laterality: N/A;   Social History:  reports that she has never smoked. She has never used smokeless tobacco. She reports that she does not currently use alcohol. She reports that she does not currently use drugs.  Allergies  Allergen Reactions   Adhesive [Tape] Hives    Tolerates paper tape   Latex Dermatitis   Succinylcholine Other (See Comments)    Have trouble waking up   Tizanidine Hives    Family History  Problem Relation Age of Onset   COPD Father    Cancer Mother        gallbladder and bladder    Hypertension Brother    Breast cancer Other    Diabetes Son    Breast cancer Cousin    Breast cancer Cousin    Pulmonary Hypertension Neg Hx     Prior to Admission medications   Medication Sig Start Date End Date Taking? Authorizing Provider  acetaminophen  (TYLENOL ) 500 MG tablet Take 1,000 mg by mouth every 6 (six) hours as needed for moderate pain or headache.   Yes [provider]  albuterol  (VENTOLIN  HFA) 108 (90 Base) MCG/ACT inhaler Inhale 2 puffs into the lungs every 6 (six) hours as needed for wheezing or  shortness of breath. 05/09/23  Yes [name], MD  allopurinol  (ZYLOPRIM ) 100 MG tablet TAKE 1 TABLET(100 MG) BY MOUTH DAILY 01/22/23  Yes Rolan Ezra RAMAN, MD  ALYQ  20 MG tablet Take 20 mg by mouth daily. 04/11/19  Yes [provider]  amLODipine  (NORVASC ) 5 MG tablet Take 1 tablet (5 mg total) by mouth daily. 05/13/24 08/13/25 Yes Rolan Ezra RAMAN, MD  apixaban  (ELIQUIS ) 5 MG TABS tablet Take 1 tablet (5 mg total) by mouth 2 (two) times daily. 10/18/22  Yes Rolan Ezra RAMAN, MD  Cholecalciferol  (VITAMIN D3) 50 MCG (2000 UT) capsule Take 2,000 Units by mouth in the morning.   Yes [provider]  colchicine 0.6 MG tablet Take 0.6 mg by mouth daily as needed (gout flare).   Yes [provider]  diclofenac Sodium (VOLTAREN) 1 % GEL Apply 2 g topically 3 (three) times daily as needed (pain.).   Yes [provider]  empagliflozin  (JARDIANCE ) 10 MG TABS tablet Take 1 tablet (10 mg total) by mouth daily. 05/28/24  Yes Rolan Ezra RAMAN, MD  fluticasone  furoate-vilanterol (BREO ELLIPTA ) 200-25 MCG/ACT AEPB Inhale 1 puff into the lungs daily. 07/15/24  Yes [name], MD  gabapentin  (NEURONTIN ) 100 MG capsule Take 100 mg by mouth daily as needed (Pain).   Yes [provider]  ipratropium-albuterol  (DUONEB) 0.5-2.5 (3) MG/3ML SOLN INHALE THE CONTENTS OF 1 VIAL VIA NEBULIZER EVERY 6 HOURS AS NEEDED FOR WHEEZING OR SHORTNESS OF BREATH 12/05/22  Yes Cobb, Comer GAILS, NP  losartan  (COZAAR ) 25 MG tablet Take 1 tablet (25 mg total) by mouth daily. 05/13/24  Yes Rolan Ezra RAMAN, MD  macitentan  (OPSUMIT ) 10 MG tablet Take 1 tablet (10 mg total) by mouth daily. 12/13/22  Yes McLean, Dalton S, MD  OXYGEN Inhale 4-8 L into the lungs continuous.   Yes [provider]  pantoprazole  (PROTONIX ) 40 MG tablet Take 1 tablet (40 mg total) by mouth daily. 07/18/24  Yes Rolan Ezra RAMAN, MD  Riociguat  (ADEMPAS ) 2 MG TABS Take 1 mg by mouth in the morning, at noon, and at  bedtime.   Yes [provider]  Selexipag  (UPTRAVI ) 800 MCG TABS Take 1 tablet (800 mcg total) by mouth 2 (two) times daily. 09/18/23  Yes Rolan Ezra RAMAN, MD  sertraline  (ZOLOFT ) 50 MG tablet Take 1 tablet (50 mg total) by mouth 2 (two) times daily. 02/15/23  Yes Rolan Ezra RAMAN, MD  spironolactone  (ALDACTONE ) 25 MG tablet TAKE 1/2 TABLET(12.5 MG) BY MOUTH DAILY 05/28/24  Yes McLean, Dalton S, MD  tirzepatide  (ZEPBOUND ) 7.5 MG/0.5ML Pen Inject 7.5 mg into the skin once a week. 08/13/24  Yes Milford, Harlene HERO, FNP  torsemide  (DEMADEX ) 20 MG tablet Take 2 tablets (40 mg total) by mouth daily. 05/28/24  Yes Rolan Ezra RAMAN, MD  famotidine  (PEPCID ) 20 MG tablet Take 20 mg by mouth as needed for heartburn or indigestion.    [provider]    Physical Exam: Vitals:   09/15/24 2130 09/15/24 2354 09/16/24 0300 09/16/24 0523  BP: 129/88 133/85  (!) 129/92  Pulse: 77 80 72 72  Resp: 18 18  (!) 22  Temp:  97.7 F (36.5 C)  97.6 F (36.4 C)  TempSrc:  Oral  Axillary  SpO2: 90% 94% 91% 96%   Physical Exam Vitals and nursing note reviewed.  Constitutional:      General: She is awake. She is not in acute distress.    Appearance: Normal appearance. She is ill-appearing.  HENT:     Head: Normocephalic.     Mouth/Throat:     Mouth: Mucous membranes are moist.  Eyes:     General: No scleral icterus.    Pupils: Pupils are equal, round, and reactive to light.  Neck:     Vascular: No JVD.  Cardiovascular:     Rate and Rhythm: Normal rate and regular rhythm.     Heart sounds: S1 normal and S2 normal.  Pulmonary:     Effort: Pulmonary effort is normal.     Breath sounds: No wheezing, rhonchi or rales.  Abdominal:     General: Bowel sounds are normal.     Palpations: Abdomen is soft.     Tenderness: There is no abdominal tenderness. There is no right CVA tenderness or left CVA tenderness.  Musculoskeletal:     Cervical back: Neck supple.     Right lower leg: No edema.      Left lower leg: No edema.  Neurological:     General: No focal deficit present.  Mental Status: She is alert and oriented to person, place, and time.  Psychiatric:        Mood and Affect: Mood normal.        Behavior: Behavior normal. Behavior is cooperative.     Data Reviewed:  Results are pending, will review when available. 03/18/2024 transthoracic echocardiogram report. IMPRESSIONS:   1. Left ventricular ejection fraction, by estimation, is 55 to 60%. The  left ventricle has normal function. The left ventricle has no regional  wall motion abnormalities. There is mild concentric left ventricular  hypertrophy. Left ventricular diastolic  parameters are consistent with Grade I diastolic dysfunction (impaired  relaxation). Elevated left ventricular end-diastolic pressure. There is  the interventricular septum is flattened in systole, consistent with right  ventricular pressure overload.   2. Right ventricular systolic function is moderately reduced. The right  ventricular size is mildly enlarged. There is moderately elevated  pulmonary artery systolic pressure. The estimated right ventricular  systolic pressure is 49.0 mmHg.   3. The mitral valve is abnormal. Trivial mitral valve regurgitation.   4. The aortic valve is tricuspid. Aortic valve regurgitation is not  visualized.   5. The inferior vena cava is normal in size with greater than 50%  respiratory variability, suggesting right atrial pressure of 3 mmHg.   Comparison(s): Changes from prior study are noted. 03/08/2021: LVEF 55%,  RVSP 48 mmHg.   EKG: Vent. rate 79 BPM PR interval 191 ms QRS duration 108 ms QT/QTcB 420/482 ms P-R-T axes -3 48 63 Sinus rhythm Borderline low voltage, extremity leads  Assessment and Plan: Principal Problem:   Acute on chronic respiratory failure (HCC) Secondary to:   Acute on chronic diastolic congestive heart failure (HCC) Superimposed on:   Pulmonary hypertension  (HCC) And:   RVF (right ventricular failure) (HCC) Continue   Restrictive lung disease Observation/telemetry. Supplemental oxygen as needed. Sodium and fluid restriction. Continue furosemide  40 mg IVP once or twice daily. Monitor daily weights, intake and output. Monitor renal function electrolytes. Continue empagliflozin  10 mg p.o. daily. Continue macitentan  10 mg p.o. daily. Continue riociguat  1 tablet p.o. 3 times daily. Continue selexipag  800 mcg p.o. twice daily. Continue tadalafil  20 mg p.o. daily. Pulmonology consult appreciated.  Active Problems:   Gout Continue allopurinol  100 mg p.o. daily.    CKD stage 3b, GFR 30-44 ml/min (HCC) Worsened due to heart failure. Continue furosemide  40 mg once or twice a day. Follow-up renal function electrolytes. Correct electrolytes as needed.    OSA on CPAP Continue CPAP at bedtime.    Chronic GERD Continue pantoprazole  40 mg p.o. daily.    Essential hypertension Continue losartan  25 mg p.o. daily. Also on furosemide  40 mg IVP QD or BID at the moment. Also on spironolactone  12.5 mg p.o. daily. Monitor blood pressure, renal function and electrolytes.    History of pulmonary embolism   History of DVT (deep vein thrombosis) Continue apixaban  5 mg p.o. twice daily.    Class 3 obesity (HCC) Making progress. Current BMI 43.57 kg/m. Continue lifestyle modifications. Follow-up closely with PCP.    Recurrent major depression Continue sertraline  50 mg p.o. twice daily.    Advance Care Planning:   Code Status: Full Code   Consults: Pulmonology.  Family Communication:   Severity of Illness: The appropriate patient status for this patient is INPATIENT. Inpatient status is judged to be reasonable and necessary in order to provide the required intensity of service to ensure the patient's safety. The patient's presenting symptoms, physical exam findings,  and initial radiographic and laboratory data in the context of their  chronic comorbidities is felt to place them at high risk for further clinical deterioration. Furthermore, it is not anticipated that the patient will be medically stable for discharge from the hospital within 2 midnights of admission.   * I certify that at the point of admission it is my clinical judgment that the patient will require inpatient hospital care spanning beyond 2 midnights from the point of admission due to high intensity of service, high risk for further deterioration and high frequency of surveillance required.*  Author: Alm Dorn Castor, MD 09/16/2024 7:57 AM  For on call review [email or web address].   This document was prepared using Dragon voice recognition software and may contain some unintended transcription errors.

## 2024-09-16 NOTE — Plan of Care (Signed)

## 2024-09-16 NOTE — Progress Notes (Signed)
   09/16/24 2214  BiPAP/CPAP/SIPAP  Reason BIPAP/CPAP not in use Other(comment) (patient stated she can't tolerate our mask and that if she has to stay another night she will have someone bring her mask from home)  BiPAP/CPAP /SiPAP Vitals  Resp (!) 27  MEWS Score/Color  MEWS Score 2  MEWS Score Color Yellow

## 2024-09-17 ENCOUNTER — Inpatient Hospital Stay (HOSPITAL_COMMUNITY)

## 2024-09-17 ENCOUNTER — Telehealth (HOSPITAL_BASED_OUTPATIENT_CLINIC_OR_DEPARTMENT_OTHER): Payer: Self-pay

## 2024-09-17 DIAGNOSIS — K219 Gastro-esophageal reflux disease without esophagitis: Secondary | ICD-10-CM

## 2024-09-17 DIAGNOSIS — J9621 Acute and chronic respiratory failure with hypoxia: Secondary | ICD-10-CM | POA: Diagnosis not present

## 2024-09-17 DIAGNOSIS — F339 Major depressive disorder, recurrent, unspecified: Secondary | ICD-10-CM

## 2024-09-17 DIAGNOSIS — Z86711 Personal history of pulmonary embolism: Secondary | ICD-10-CM

## 2024-09-17 DIAGNOSIS — I272 Pulmonary hypertension, unspecified: Secondary | ICD-10-CM

## 2024-09-17 DIAGNOSIS — E66813 Obesity, class 3: Secondary | ICD-10-CM

## 2024-09-17 DIAGNOSIS — I10 Essential (primary) hypertension: Secondary | ICD-10-CM | POA: Diagnosis not present

## 2024-09-17 DIAGNOSIS — R0609 Other forms of dyspnea: Secondary | ICD-10-CM

## 2024-09-17 DIAGNOSIS — I5033 Acute on chronic diastolic (congestive) heart failure: Secondary | ICD-10-CM

## 2024-09-17 DIAGNOSIS — M1A9XX0 Chronic gout, unspecified, without tophus (tophi): Secondary | ICD-10-CM

## 2024-09-17 DIAGNOSIS — N1832 Chronic kidney disease, stage 3b: Secondary | ICD-10-CM

## 2024-09-17 DIAGNOSIS — G4733 Obstructive sleep apnea (adult) (pediatric): Secondary | ICD-10-CM

## 2024-09-17 DIAGNOSIS — I50810 Right heart failure, unspecified: Secondary | ICD-10-CM

## 2024-09-17 DIAGNOSIS — I8290 Acute embolism and thrombosis of unspecified vein: Secondary | ICD-10-CM

## 2024-09-17 DIAGNOSIS — Z86718 Personal history of other venous thrombosis and embolism: Secondary | ICD-10-CM

## 2024-09-17 LAB — CBC WITH DIFFERENTIAL/PLATELET
Abs Immature Granulocytes: 0.04 K/uL (ref 0.00–0.07)
Basophils Absolute: 0 K/uL (ref 0.0–0.1)
Basophils Relative: 1 %
Eosinophils Absolute: 0.1 K/uL (ref 0.0–0.5)
Eosinophils Relative: 2 %
HCT: 40.5 % (ref 36.0–46.0)
Hemoglobin: 12.7 g/dL (ref 12.0–15.0)
Immature Granulocytes: 1 %
Lymphocytes Relative: 19 %
Lymphs Abs: 1 K/uL (ref 0.7–4.0)
MCH: 31.2 pg (ref 26.0–34.0)
MCHC: 31.4 g/dL (ref 30.0–36.0)
MCV: 99.5 fL (ref 80.0–100.0)
Monocytes Absolute: 0.4 K/uL (ref 0.1–1.0)
Monocytes Relative: 8 %
Neutro Abs: 3.6 K/uL (ref 1.7–7.7)
Neutrophils Relative %: 69 %
Platelets: 223 K/uL (ref 150–400)
RBC: 4.07 MIL/uL (ref 3.87–5.11)
RDW: 15.5 % (ref 11.5–15.5)
WBC: 5.1 K/uL (ref 4.0–10.5)
nRBC: 0 % (ref 0.0–0.2)

## 2024-09-17 LAB — BASIC METABOLIC PANEL WITH GFR
Anion gap: 11 (ref 5–15)
BUN: 38 mg/dL — ABNORMAL HIGH (ref 6–20)
CO2: 21 mmol/L — ABNORMAL LOW (ref 22–32)
Calcium: 9.8 mg/dL (ref 8.9–10.3)
Chloride: 103 mmol/L (ref 98–111)
Creatinine, Ser: 1.98 mg/dL — ABNORMAL HIGH (ref 0.44–1.00)
GFR, Estimated: 29 mL/min — ABNORMAL LOW (ref >60)
Glucose, Bld: 88 mg/dL (ref 70–99)
Potassium: 3.9 mmol/L (ref 3.5–5.1)
Sodium: 135 mmol/L (ref 135–145)

## 2024-09-17 LAB — ECHOCARDIOGRAM COMPLETE
Calc EF: 53.8 %
Height: 60 in
MV M vel: 3.95 m/s
MV Peak grad: 62.4 mmHg
S' Lateral: 2.8 cm
Single Plane A2C EF: 52.5 %
Single Plane A4C EF: 53.8 %
Weight: 3492.09 [oz_av]

## 2024-09-17 LAB — MISC LABCORP TEST (SEND OUT): Labcorp test code: 83935

## 2024-09-17 LAB — MAGNESIUM: Magnesium: 2.3 mg/dL (ref 1.7–2.4)

## 2024-09-17 LAB — GLUCOSE, CAPILLARY: Glucose-Capillary: 97 mg/dL (ref 70–99)

## 2024-09-17 MED ORDER — MELATONIN 5 MG PO TABS
5.0000 mg | ORAL_TABLET | Freq: Every evening | ORAL | Status: DC | PRN
  Administered 2024-09-17: 5 mg via ORAL
  Filled 2024-09-17: qty 1

## 2024-09-17 MED ORDER — FUROSEMIDE 10 MG/ML IJ SOLN
40.0000 mg | Freq: Two times a day (BID) | INTRAMUSCULAR | Status: DC
  Administered 2024-09-17 – 2024-09-18 (×3): 40 mg via INTRAVENOUS
  Filled 2024-09-17 (×3): qty 4

## 2024-09-17 MED ORDER — FAMOTIDINE 20 MG PO TABS
20.0000 mg | ORAL_TABLET | Freq: Every day | ORAL | Status: DC
  Administered 2024-09-17 – 2024-09-18 (×2): 20 mg via ORAL
  Filled 2024-09-17 (×2): qty 1

## 2024-09-17 NOTE — Progress Notes (Addendum)
 NAME:  Dawn Fowler, MRN:  [id number], DOB:  August 01, 1966, LOS: 1 ADMISSION DATE:  09/15/2024, CONSULTATION DATE:  09/16/24 REFERRING MD:  EDP, CHIEF COMPLAINT:  acute on chronic hypoxic resp failure   History of Present Illness:  58 yo female presented at recommendation of primary pulmonology office when she notified the clinic that her oxygen requirement appears to have escalated. Pt states that for the past month she has been increasingly sob with movement and requiring increased oxygen with any mobility. She is on 4L at baseline but having to increase to 10-15 per per report. She states her oxygen level falls to 70's with any movement. She feels heaviness and weakness in her body when she has these episodes, denies dizziness or ha/cp. Pt has no known recent sick contacts. No fever/chills. No n/v/d. Pt has had cough for same duration in the morning when she awakens but reports the sputum is typically clear to slight white. It improves after she is moving around during the day.   Of note pt does have h/o dvt and is on chronic eliquis . She endorses consistency with all her medications and certainly not missing a dose of eliquis .   Pertinent  Medical History  Chronic hypoxic resp failure on 3-4L [place] Restrictive lung disease Chronic dvt on eliquis  at baseline htn CKD3b HFpEF with RHF mod reduced EF and mod pulm htn [id number] Significant Hospital Events: Including procedures, antibiotic start and stop dates in addition to other pertinent events   Admitted to [facility] 11/18  Interim History / Subjective:   Respiratory status is improved.  Back to 4 L oxygen States that dyspnea is better.  Objective    Blood pressure (!) 133/96, pulse 74, temperature (!) 97.5 F (36.4 C), temperature source Oral, resp. rate 17, height 5' (1.524 m), weight 99 kg, SpO2 91%.        Intake/Output Summary (Last 24 hours) at 09/17/2024 1008 Last data filed at 09/16/2024 2130 Gross per 24 hour  Intake 240 ml   Output 900 ml  Net -660 ml   Filed Weights   09/16/24 1127 09/16/24 1538 09/17/24 0623  Weight: 101.2 kg 99.1 kg 99 kg    Examination: Gen:      No acute distress HEENT:  EOMI, sclera anicteric Neck:     No masses; no thyromegaly Lungs:    Clear to auscultation bilaterally; normal respiratory effort CV:         Regular rate and rhythm; no murmurs Abd:      + bowel sounds; soft, non-tender; no palpable masses, no distension Ext:    No edema; adequate peripheral perfusion Neuro: alert and oriented x 3 Psych: normal mood and affect   Labs/imaging reviewed Significant for BUN/creatinine 36/2.12 Chest x-ray with cardiomegaly, pulmonary edema.  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 Managed with Optravi, Opsumit , and Adempas . Recent up titration of Adempas  may have contributed to pulmonary edema. No current exacerbation of asthma or other lung conditions.  Low suspicion for any infection.   - Continue Optravi, Opsumit , and keep Adempas  at current dose without titration - Will let Dr. Rolan know that she is admitted - Continue diuresis.  Repeat chest x-ray and echocardiogram - Continue bronchodilators, CPAP at night  Signature:   Arlita Buffkin  MD [facility] See Amion for pager  If no response to pager , please call [phone] until 7pm After 7:00 pm call Elink  [phone] 09/17/2024, 10:09 AM

## 2024-09-17 NOTE — Progress Notes (Signed)
 Heart Failure Navigator Progress Note  Assessed for Heart & Vascular TOC clinic readiness.  Patient does not meet criteria due to she is an Advanced Heart Failure Team patient of Dr. Rolan. .   Navigator will sign off at this time.   Stephane Haddock, BSN, Scientist, clinical (histocompatibility and immunogenetics) Only

## 2024-09-17 NOTE — Evaluation (Signed)
 Physical Therapy Evaluation Patient Details Name: Dawn Fowler MRN: [id number] DOB: 1966/08/07 Today's Date: 09/17/2024  History of Present Illness  58 yo female admitted with acute resp failure. Hx of chronic respiratory failure 2* restrictive lung disease-O2 dep (4L at rest/6-8 L with activity/as high as 15L at cardiopulmonary rehab), pulm HTN, OSA, CKD, obesity, PE,COVID  Clinical Impression  On eval, pt was Supv-Mod Ind with mobility. She ambulated to/from bathroom on 6L [place]-sats 85% and pt c/o some lightheadedness. After resting for a bit, pt requested to try hallway ambulation. She requested to be placed on 10L-pt had HFNC in room on her personal O2 tank. O2 93% on 10L HFNC with ambulation. Placed pt back on wall 6L [place]-sats 93% end of session. Pt tolerated activity well. Will plan to follow. Recommend pt resume cardiopulmonary rehab once she feels ready to.         If plan is discharge home, recommend the following:     Can travel by private vehicle        Equipment Recommendations None recommended by PT  Recommendations for Other Services       Functional Status Assessment Patient has had a recent decline in their functional status and demonstrates the ability to make significant improvements in function in a reasonable and predictable amount of time.     Precautions / Restrictions Precautions Precautions: Fall Precaution/Restrictions Comments: O2 dep-monitor Restrictions Weight Bearing Restrictions Per Provider Order: No      Mobility  Bed Mobility Overal bed mobility: Modified Independent                  Transfers Overall transfer level: Modified independent                      Ambulation/Gait Ambulation/Gait assistance: Supervision Gait Distance (Feet): 150 Feet Assistive device: Rollator (4 wheels) Gait Pattern/deviations: Step-through pattern, Decreased stride length       General Gait Details: slow gait speed. mild lightheadedness.  O2: 85% on 6L [place], 93% on 10L HFNC-placed on per pt request for hallway ambulation).  Stairs            Wheelchair Mobility     Tilt Bed    Modified Rankin (Stroke Patients Only)       Balance Overall balance assessment: Mild deficits observed, not formally tested                                           Pertinent Vitals/Pain Pain Assessment Pain Assessment: No/denies pain    Home Living Family/patient expects to be discharged to:: Private residence Living Arrangements: Other relatives Available Help at Discharge: Available PRN/intermittently Type of Home: Apartment Home Access: Level entry       Home Layout: One level Home Equipment: Rollator (4 wheels);Cane - single point      Prior Function Prior Level of Function : Independent/Modified Independent             Mobility Comments: uses rollator       Extremity/Trunk Assessment   Upper Extremity Assessment Upper Extremity Assessment: Defer to OT evaluation    Lower Extremity Assessment Lower Extremity Assessment: Generalized weakness    Cervical / Trunk Assessment Cervical / Trunk Assessment: Normal  Communication   Communication Communication: No apparent difficulties    Cognition Arousal: Alert Behavior During Therapy: WFL for tasks assessed/performed   PT -  Cognitive impairments: No apparent impairments                         Following commands: Intact       Cueing Cueing Techniques: Verbal cues     General Comments      Exercises     Assessment/Plan    PT Assessment Patient needs continued PT services  PT Problem List Decreased activity tolerance;Decreased mobility       PT Treatment Interventions DME instruction;Gait training;Functional mobility training;Therapeutic activities;Therapeutic exercise;Patient/family education;Balance training    PT Goals (Current goals can be found in the Care Plan section)  Acute Rehab PT Goals Patient  Stated Goal: get better PT Goal Formulation: With patient Time For Goal Achievement: 10/01/24 Potential to Achieve Goals: Good    Frequency Min 2X/week     Co-evaluation               AM-PAC PT 6 Clicks Mobility  Outcome Measure Help needed turning from your back to your side while in a flat bed without using bedrails?: None Help needed moving from lying on your back to sitting on the side of a flat bed without using bedrails?: None Help needed moving to and from a bed to a chair (including a wheelchair)?: None Help needed standing up from a chair using your arms (e.g., wheelchair or bedside chair)?: None Help needed to walk in hospital room?: A Little Help needed climbing 3-5 steps with a railing? : A Little 6 Click Score: 22    End of Session Equipment Utilized During Treatment: Oxygen Activity Tolerance: Patient tolerated treatment well Patient left: in bed;with call bell/phone within reach   PT Visit Diagnosis: Difficulty in walking, not elsewhere classified (R26.2);Unsteadiness on feet (R26.81)    Time: 8378-8340 PT Time Calculation (min) (ACUTE ONLY): 38 min   Charges:   PT Evaluation $PT Eval Low Complexity: 1 Low PT Treatments $Gait Training: 23-37 mins PT General Charges $$ ACUTE PT VISIT: 1 Visit           Dannial SQUIBB, PT Acute Rehabilitation  Office: [phone]

## 2024-09-17 NOTE — Plan of Care (Signed)

## 2024-09-17 NOTE — Progress Notes (Signed)
   09/17/24 2104  BiPAP/CPAP/SIPAP  $ Non-Invasive Ventilator  Non-Invasive Vent Initial  BiPAP/CPAP/SIPAP Pt Type Adult  BiPAP/CPAP/SIPAP Resmed  Mask Type  (Home mask)  Respiratory Rate 20 breaths/min  Flow Rate 4 lpm  Patient Home Machine No  Patient Home Mask Yes  Patient Home Tubing No  Auto Titrate Yes  Minimum cmH2O 4 cmH2O  Maximum cmH2O 15 cmH2O  Device Plugged into RED Power Outlet Yes  BiPAP/CPAP /SiPAP Vitals  Pulse Rate 81  Resp 20  SpO2 94 %  MEWS Score/Color  MEWS Score 0  MEWS Score Color [place]

## 2024-09-17 NOTE — TOC Initial Note (Signed)
 Transition of Care [facility]) - Initial/Assessment Note   Patient Details  Name: Dawn Fowler MRN: [id number] Date of Birth: 12-23-1965  [facility]) CM/SW Contact:    Duwaine GORMAN Aran, LCSW Phone Number: 09/17/2024, 8:55 AM  Clinical Narrative: Care management consulted for heart failure screening. Heart failure navigation team consulted. Of note, patient has had 1 hospital admission and 0 ED visits in the past 6 months. Consult to be cleared at this time.  Expected Discharge Plan: Home/Self Care Barriers to Discharge: Continued Medical Work up  Patient Goals and CMS Choice Choice offered to / list presented to : NA  Expected Discharge Plan and Services In-house Referral: Clinical Social Work Post Acute Care Choice: NA Living arrangements for the past 2 months: Single Family Home            DME Arranged: N/A DME Agency: NA  Prior Living Arrangements/Services Living arrangements for the past 2 months: Single Family Home Lives with:: Relatives Patient language and need for interpreter reviewed:: Yes Do you feel safe going back to the place where you live?: Yes      Need for Family Participation in Patient Care: No (Comment) Care giver support system in place?: Yes (comment) Criminal Activity/Legal Involvement Pertinent to Current Situation/Hospitalization: No - Comment as needed  Activities of Daily Living ADL Screening (condition at time of admission) Independently performs ADLs?: Yes (appropriate for developmental age) Is the patient deaf or have difficulty hearing?: No Does the patient have difficulty seeing, even when wearing glasses/contacts?: No Does the patient have difficulty concentrating, remembering, or making decisions?: No  Emotional Assessment Alcohol / Substance Use: Not Applicable Psych Involvement: No (comment)  Admission diagnosis:  Acute on chronic respiratory failure (HCC) [J96.20] Acute on chronic respiratory failure with hypoxia (HCC)  [J96.21] Patient Active Problem List   Diagnosis Date Noted   Acute on chronic respiratory failure (HCC) 09/16/2024   Chronic GERD 09/16/2024   De Quervain's tenosynovitis 09/16/2024   Essential hypertension 09/16/2024   Recurrent major depression 09/16/2024   History of pulmonary embolism 09/16/2024   Acute on chronic diastolic congestive heart failure (HCC) 09/16/2024   Class 3 obesity (HCC) 09/16/2024   History of DVT (deep vein thrombosis) 09/16/2024   Knee pain 12/25/2023   Chronic diarrhea 08/07/2022   Breast hypertrophy 12/20/2021   Chronic diastolic (congestive) heart failure (HCC) 06/10/2020   Restrictive lung disease 05/11/2020   RVF (right ventricular failure) (HCC)    Exertional dyspnea    Chronic hypoxemic respiratory failure (HCC) 11/20/2019   OSA on CPAP 11/20/2019   Complex care coordination 11/20/2019   Morbid obesity with BMI of 45.0-49.9, adult (HCC) 11/20/2019   Pulmonary hypertension (HCC) 10/30/2019   Gout 10/30/2019   CKD stage 3b, GFR 30-44 ml/min (HCC) 10/30/2019   Vitamin D  deficiency 07/11/2015   Homelessness 02/04/2015   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 [place] [address] Phone: [phone] Fax: [phone]  [facility] [phone] [place] [place], [place] - [address] AT [facility] & [address] [place] [address] Phone: [phone] Fax: [phone]  Social Drivers of Health (SDOH) Social History: SDOH Screenings   Food Insecurity: No Food Insecurity (09/16/2024)  Housing: Low Risk  (09/16/2024)  Transportation Needs: No Transportation Needs (09/16/2024)  Utilities: Not At Risk (09/16/2024)  Depression (PHQ2-9): Medium Risk (08/22/2024)  Financial Resource Strain: High Risk (07/12/2022)  Social Connections: Moderately Isolated (09/16/2024)   Tobacco Use: Low Risk  (09/16/2024)   SDOH Interventions:    Readmission Risk Interventions     No data to display

## 2024-09-17 NOTE — Telephone Encounter (Signed)
 Spoke with pt she has spoken with the hospital provider and knows the plan now     Copied from CRM [id number]. Topic: General - Other >> Sep 17, 2024  8:11 AM Essie A wrote: Reason for CRM: Patient was sent to the ER on 09/15/24 by triage nurse.  She was admitted to the hospital and was seen by the pulmonary doctor that night but has not seen a doctor since.  She is still in  [facility], room 1419 on 4th floor.  She was wondering if one of the doctors from the office was going to see her.  Please call her at [phone].  Thanks.

## 2024-09-17 NOTE — Progress Notes (Signed)
 Echocardiogram 2D Echocardiogram has been performed.  Koleen KANDICE Popper, RDCS 09/17/2024, 3:16 PM

## 2024-09-17 NOTE — Progress Notes (Signed)
 PROGRESS NOTE    Dawn Fowler  FMW:[id number] DOB: 09/25/1966 DOA: 09/15/2024 PCP: [name], MD   Chief Complaint  Patient presents with   Low O2   Dizziness    Brief Narrative:  Patient 58 year old female history of anxiety, depression, CKD stage III, history of right lower extremity DVT, GERD, hyperlipidemia, hypertension, prediabetes, class III obesity, pseudocholinesterase deficiency, OSA, pulmonary hypertension, prior history of acute respiratory failure due to COVID-19, restrictive lung disease presented to the ED due to hypoxia, occasional productive cough of whitish sputum, dizziness, worsening exercise tolerance.  Patient noted to have increased O2 requirements up to 15 L in order to work during pulmonary rehab.  Patient also noted to have worsening shortness of breath on minimal ambulation with increased O2 intake.  proBNP noted elevated at 772.  CBC D-dimer normal.  Patient admitted for acute on chronic respiratory failure felt secondary to acute on chronic diastolic CHF and pulmonary hypertension.  PCCM/pulmonary consulted and following.   Assessment & Plan:   Principal Problem:   Acute on chronic respiratory failure (HCC) Active Problems:   Pulmonary hypertension (HCC)   Gout   CKD stage 3b, GFR 30-44 ml/min (HCC)   OSA on CPAP   RVF (right ventricular failure) (HCC)   Restrictive lung disease   Chronic GERD   Essential hypertension   Recurrent major depression   History of pulmonary embolism   Acute on chronic diastolic congestive heart failure (HCC)   Class 3 obesity (HCC)   History of DVT (deep vein thrombosis)  #1 acute on chronic respiratory failure likely secondary to acute on chronic diastolic CHF in the setting of pulmonary hypertension. - Patient with history of restrictive lung disease on chronic home O2 of 4 L at baseline and having increased to 10 to 15 L per patient with bouts of hypoxia with sats in the 70s on exertion. - Patient with history of  pulmonary hypertension managed with Uptravi , Opsumit , Adempas . - Patient noted to have had recent up titration of Adempas  which per pulmonary may have contributed to her pulmonary edema. - Low suspicion for infectious etiology. - Chest x-ray done on admission with cardiomegaly with central pulmonary vascular congestion. - Repeat chest x-ray with no frank interstitial edema or pleural effusion, enlargement of central pulmonary arteries within the right lung, suggestive of pulmonary arterial hypertension - Repeat 2D echo done with EF of 55 to 60%,NWMA, grade 1 DD, moderately reduced right ventricular systolic function, right ventricular size mildly enlarged, mildly elevated pulmonary arterial systolic pressure with estimated right ventricular systolic pressure of 39 mmHg.  Mildly dilated right atrial size.  Trivial MVR. -pBNP noted to be elevated on admission at 772. -Patient noted to have received a dose of IV Lasix  urine output of 900 cc over the past 24 hours. -Place on Lasix  40 mg IV every 12 hours. -Continue Breo Ellipta , spironolactone . -Continue selexipag , tadalafil , Opsumit , riociguat  (continue current dose without uptitration per pulmonary recommendations) -Strict I's and O's, daily weights. - Patient with clinical improvement - CPAP nightly. - Pulmonary following and appreciate their input and recommendations.  2.  Gout -Allopurinol .  3.  CKD stage IIIb -Slight improvement with creatinine with diuresis. - Place on Lasix  40 mg IV every 12 hours. - Monitor urine output. - Outpatient follow-up with primary nephrologist.  4.  OSA -CPAP nightly.  5.  GERD -PPI.  6.  Hypertension -Currently on Lasix  40 mg IV every 12 hours. - Continue spironolactone  12.5 mg daily, losartan  25 mg daily.  7.  History of PE/history of DVT -Continue Eliquis .  8.  Recurrent major depression -Stable. - Continue sertraline  50 mg twice daily  9.  Class III obesity -BMI 43.57 kg/m. - Lifestyle  modification. - Outpatient follow-up with PCP.   DVT prophylaxis: Eliquis  Code Status: Full Family Communication: Updated patient.  No family at bedside. Disposition: Home when clinically improved, back to baseline and cleared by pulmonary  Status is: Inpatient Remains inpatient appropriate because: Severity of illness   Consultants:  PCCM/pulmonary: Dr. Layman 09/16/2024  Procedures:  Chest x-ray 09/15/2024, 09/17/2024 2D echo 09/17/2024  Antimicrobials:  Anti-infectives (From admission, onward)    None         Subjective: Patient sitting up in bed.  States some improvement with shortness of breath.  Denies any chest pain or abdominal pain.  States has good urine output.  Feels oxygenation/shortness of breath has improved.  Asking when she is going to be able to go home.  Objective: Vitals:   09/17/24 0427 09/17/24 0623 09/17/24 1237 09/17/24 1300  BP: (!) 133/96  112/82   Pulse: 74  78   Resp: 17  15   Temp: (!) 97.5 F (36.4 C)  97.8 F (36.6 C)   TempSrc: Oral  Oral   SpO2: 91%  94% 93%  Weight:  99 kg    Height:        Intake/Output Summary (Last 24 hours) at 09/17/2024 1422 Last data filed at 09/17/2024 1243 Gross per 24 hour  Intake 840 ml  Output 1400 ml  Net -560 ml   Filed Weights   09/16/24 1127 09/16/24 1538 09/17/24 0623  Weight: 101.2 kg 99.1 kg 99 kg    Examination:  General exam: Appears calm and comfortable  Respiratory system: Some bibasilar crackles.  No wheezing.  Fair air movement.  Speaking in full sentences.  No use of accessory muscles of respiration.   Cardiovascular system: S1 & S2 heard, RRR. No JVD, murmurs, rubs, gallops or clicks. No pedal edema. Gastrointestinal system: Abdomen is nondistended, soft and nontender. No organomegaly or masses felt. Normal bowel sounds heard. Central nervous system: Alert and oriented. No focal neurological deficits. Extremities: Symmetric 5 x 5 power. Skin: No rashes, lesions or  ulcers Psychiatry: Judgement and insight appear normal. Mood & affect appropriate.     Data Reviewed: I have personally reviewed following labs and imaging studies  CBC: Recent Labs  Lab 09/15/24 2045 09/16/24 0500 09/17/24 1103  WBC 5.9 6.2 5.1  NEUTROABS 4.1  --  3.6  HGB 12.4 12.8 12.7  HCT 39.2 40.6 40.5  MCV 98.5 98.3 99.5  PLT 240 274 223    Basic Metabolic Panel: Recent Labs  Lab 09/15/24 2045 09/16/24 0500 09/17/24 1103  NA 138 141 135  K 3.7 3.9 3.9  CL 104 105 103  CO2 22 23 21*  GLUCOSE 102* 90 88  BUN 34* 36* 38*  CREATININE 2.21* 2.12* 1.98*  CALCIUM 9.9 10.0 9.8  MG  --  2.3 2.3    GFR: Estimated Creatinine Clearance: 33.1 mL/min (A) (by C-G formula based on SCr of 1.98 mg/dL (H)).  Liver Function Tests: Recent Labs  Lab 09/15/24 2045 09/16/24 0500  AST 19 20  ALT 12 15  ALKPHOS 95 97  BILITOT 0.4 0.3  PROT 6.9 7.3  ALBUMIN  4.2 4.5    CBG: Recent Labs  Lab 09/16/24 0831 09/17/24 0739  GLUCAP 94 97     Recent Results (from the past 240 hours)  Resp panel  by RT-PCR (RSV, Flu A&B, Covid) Anterior Nasal Swab     Status: None   Collection Time: 09/15/24  8:50 PM   Specimen: Anterior Nasal Swab  Result Value Ref Range Status   SARS Coronavirus 2 by RT PCR NEGATIVE NEGATIVE Final    Comment: (NOTE) SARS-CoV-2 target nucleic acids are NOT DETECTED.  The SARS-CoV-2 RNA is generally detectable in upper respiratory specimens during the acute phase of infection. The lowest concentration of SARS-CoV-2 viral copies this assay can detect is 138 copies/mL. A negative result does not preclude SARS-Cov-2 infection and should not be used as the sole basis for treatment or other patient management decisions. A negative result may occur with  improper specimen collection/handling, submission of specimen other than nasopharyngeal swab, presence of viral mutation(s) within the areas targeted by this assay, and inadequate number of  viral copies(<138 copies/mL). A negative result must be combined with clinical observations, patient history, and epidemiological information. The expected result is Negative.  Fact Sheet for Patients:  bloggercourse.com  Fact Sheet for Healthcare Providers:  seriousbroker.it  This test is no t yet approved or cleared by the [place]  FDA and  has been authorized for detection and/or diagnosis of SARS-CoV-2 by FDA under an Emergency Use Authorization (EUA). This EUA will remain  in effect (meaning this test can be used) for the duration of the COVID-19 declaration under Section 564(b)(1) of the Act, 21 U.S.C.section 360bbb-3(b)(1), unless the authorization is terminated  or revoked sooner.       Influenza A by PCR NEGATIVE NEGATIVE Final   Influenza B by PCR NEGATIVE NEGATIVE Final    Comment: (NOTE) The Xpert Xpress SARS-CoV-2/FLU/RSV plus assay is intended as an aid in the diagnosis of influenza from Nasopharyngeal swab specimens and should not be used as a sole basis for treatment. Nasal washings and aspirates are unacceptable for Xpert Xpress SARS-CoV-2/FLU/RSV testing.  Fact Sheet for Patients: bloggercourse.com  Fact Sheet for Healthcare Providers: seriousbroker.it  This test is not yet approved or cleared by the [place]  FDA and has been authorized for detection and/or diagnosis of SARS-CoV-2 by FDA under an Emergency Use Authorization (EUA). This EUA will remain in effect (meaning this test can be used) for the duration of the COVID-19 declaration under Section 564(b)(1) of the Act, 21 U.S.C. section 360bbb-3(b)(1), unless the authorization is terminated or revoked.     Resp Syncytial Virus by PCR NEGATIVE NEGATIVE Final    Comment: (NOTE) Fact Sheet for Patients: bloggercourse.com  Fact Sheet for Healthcare  Providers: seriousbroker.it  This test is not yet approved or cleared by the [place]  FDA and has been authorized for detection and/or diagnosis of SARS-CoV-2 by FDA under an Emergency Use Authorization (EUA). This EUA will remain in effect (meaning this test can be used) for the duration of the COVID-19 declaration under Section 564(b)(1) of the Act, 21 U.S.C. section 360bbb-3(b)(1), unless the authorization is terminated or revoked.  Performed at [facility], [address], [place], [place] [address]          Radiology Studies: DG Chest Portable 1 View Result Date: 09/15/2024 CLINICAL DATA:  Shortness of breath EXAM: PORTABLE CHEST 1 VIEW COMPARISON:  Chest x-ray 11/07/2021. FINDINGS: Cardiac silhouette is enlarged. There central pulmonary vascular congestion. There is no lung consolidation, pleural effusion or pneumothorax. No acute fractures are seen. IMPRESSION: Cardiomegaly with central pulmonary vascular congestion. Electronically Signed   By: Greig Pique M.D.   On: 09/15/2024 21:37  Scheduled Meds:  allopurinol   100 mg Oral Daily   apixaban   5 mg Oral BID   cholecalciferol   2,000 Units Oral q AM   empagliflozin   10 mg Oral Daily   famotidine   20 mg Oral Daily   fluticasone  furoate-vilanterol  1 puff Inhalation Daily   furosemide   40 mg Intravenous BID   losartan   25 mg Oral Daily   macitentan   10 mg Oral Daily   pantoprazole   40 mg Oral Daily   Riociguat   1 mg Oral TID   Selexipag   1 tablet Oral BID   sertraline   50 mg Oral BID   sodium chloride  flush  3 mL Intravenous Q12H   spironolactone   12.5 mg Oral Daily   tadalafil  (PAH)  20 mg Oral Daily   Continuous Infusions:   LOS: 1 day    Time spent: 40 minutes    Toribio Hummer, MD [facility]   To contact the attending provider between 7A-7P or the covering provider during after hours 7P-7A, please log into the web site [email or web address]  and access using universal [place] password for that web site. If you do not have the password, please call the hospital operator.  09/17/2024, 2:22 PM

## 2024-09-18 ENCOUNTER — Encounter (HOSPITAL_COMMUNITY)

## 2024-09-18 DIAGNOSIS — J9621 Acute and chronic respiratory failure with hypoxia: Secondary | ICD-10-CM | POA: Diagnosis not present

## 2024-09-18 DIAGNOSIS — E66813 Obesity, class 3: Secondary | ICD-10-CM | POA: Diagnosis not present

## 2024-09-18 DIAGNOSIS — J984 Other disorders of lung: Secondary | ICD-10-CM

## 2024-09-18 DIAGNOSIS — Z86711 Personal history of pulmonary embolism: Secondary | ICD-10-CM | POA: Diagnosis not present

## 2024-09-18 DIAGNOSIS — F339 Major depressive disorder, recurrent, unspecified: Secondary | ICD-10-CM | POA: Diagnosis not present

## 2024-09-18 LAB — CBC WITH DIFFERENTIAL/PLATELET
Abs Immature Granulocytes: 0.02 K/uL (ref 0.00–0.07)
Basophils Absolute: 0 K/uL (ref 0.0–0.1)
Basophils Relative: 1 %
Eosinophils Absolute: 0.1 K/uL (ref 0.0–0.5)
Eosinophils Relative: 3 %
HCT: 39 % (ref 36.0–46.0)
Hemoglobin: 12.2 g/dL (ref 12.0–15.0)
Immature Granulocytes: 0 %
Lymphocytes Relative: 23 %
Lymphs Abs: 1.1 K/uL (ref 0.7–4.0)
MCH: 31.1 pg (ref 26.0–34.0)
MCHC: 31.3 g/dL (ref 30.0–36.0)
MCV: 99.5 fL (ref 80.0–100.0)
Monocytes Absolute: 0.4 K/uL (ref 0.1–1.0)
Monocytes Relative: 9 %
Neutro Abs: 3.1 K/uL (ref 1.7–7.7)
Neutrophils Relative %: 64 %
Platelets: 210 K/uL (ref 150–400)
RBC: 3.92 MIL/uL (ref 3.87–5.11)
RDW: 15.5 % (ref 11.5–15.5)
WBC: 4.8 K/uL (ref 4.0–10.5)
nRBC: 0 % (ref 0.0–0.2)

## 2024-09-18 LAB — BASIC METABOLIC PANEL WITH GFR
Anion gap: 12 (ref 5–15)
BUN: 43 mg/dL — ABNORMAL HIGH (ref 6–20)
CO2: 22 mmol/L (ref 22–32)
Calcium: 10 mg/dL (ref 8.9–10.3)
Chloride: 104 mmol/L (ref 98–111)
Creatinine, Ser: 2.19 mg/dL — ABNORMAL HIGH (ref 0.44–1.00)
GFR, Estimated: 26 mL/min — ABNORMAL LOW (ref >60)
Glucose, Bld: 82 mg/dL (ref 70–99)
Potassium: 3.6 mmol/L (ref 3.5–5.1)
Sodium: 138 mmol/L (ref 135–145)

## 2024-09-18 LAB — GLUCOSE, CAPILLARY: Glucose-Capillary: 83 mg/dL (ref 70–99)

## 2024-09-18 MED ORDER — TADALAFIL 20 MG PO TABS
20.0000 mg | ORAL_TABLET | Freq: Every day | ORAL | Status: DC
  Administered 2024-09-18: 20 mg via ORAL
  Filled 2024-09-18: qty 1

## 2024-09-18 NOTE — Plan of Care (Signed)

## 2024-09-18 NOTE — Evaluation (Signed)
 Occupational Therapy Evaluation Patient Details Name: Dawn Fowler MRN: [id number] DOB: [birth year] Today's Date: 09/18/2024   History of Present Illness   58 yo female admitted with acute resp failure. Hx of chronic respiratory failure 2* restrictive lung disease-O2 dep (4L at rest/6-8 L with activity/as high as 15L at cardiopulmonary rehab), pulm HTN, OSA, CKD, obesity, PE,COVID     Clinical Impressions PTA, patient lives at home with aunt who she is primary caregiver for and was indep with all aspects of A/IADL's including driving using upright rollator and home HF O2. Currently, patient presents with deficits outlined below (see OT Problem List for details) most significantly decreased activity tolerance, baseline joint pain and mild balance deficits impacting BADL's and functional mobility. Sats this session on 4-6 ltrs HF OT via [place] remained 94-98%. OT recommending patient return to pulmonary rehab once able, otherwise no OT follow up needs identified. Patient requires continued Acute care hospital level OT services to progress safety and functional performance and allow for discharge.       If plan is discharge home, recommend the following:   A little help with walking and/or transfers;Assist for transportation;Help with stairs or ramp for entrance     Functional Status Assessment   Patient has had a recent decline in their functional status and demonstrates the ability to make significant improvements in function in a reasonable and predictable amount of time.     Equipment Recommendations   None recommended by OT      Precautions/Restrictions   Precautions Precautions: Fall Precaution/Restrictions Comments: O2 dep-monitor Restrictions Weight Bearing Restrictions Per Provider Order: No     Mobility Bed Mobility Overal bed mobility: Modified Independent                  Transfers Overall transfer level: Modified independent                  General transfer comment: rollator use with OT assist with O2 tank hallway and in room amb      Balance Overall balance assessment: Mild deficits observed, not formally tested                                         ADL either performed or assessed with clinical judgement   ADL Overall ADL's : Modified independent                                       General ADL Comments: educated on ECT strategies and issued handout for 5 P's with integration of breathing, pacing and positioning emphasized     Vision Baseline Vision/History: 1 Wears glasses;0 No visual deficits              Pertinent Vitals/Pain Pain Assessment Pain Assessment: No/denies pain     Extremity/Trunk Assessment Upper Extremity Assessment Upper Extremity Assessment: Right hand dominant;Overall WFL for tasks assessed (L UE OA deficits baseline and has wrist splint in place)   Lower Extremity Assessment Lower Extremity Assessment: Defer to PT evaluation   Cervical / Trunk Assessment Cervical / Trunk Assessment: Normal   Communication Communication Communication: No apparent difficulties   Cognition Arousal: Alert Behavior During Therapy: WFL for tasks assessed/performed Cognition: No apparent impairments  Following commands: Intact       Cueing  General Comments   Cueing Techniques: Verbal cues  Sats remained 96-98% on HF O2 4-6 ltrs during mobility and ADL activity   Exercises Exercises: Other exercises (issued and trained in L1 tricpe press tband Bly 10 reps  3x per day)        Home Living Family/patient expects to be discharged to:: Private residence Living Arrangements: Other relatives Available Help at Discharge: Available PRN/intermittently Type of Home: Apartment Home Access: Level entry     Home Layout: One level     Bathroom Shower/Tub: Chief Strategy Officer: Standard Bathroom  Accessibility: Yes How Accessible: Accessible via walker Home Equipment: Rollator (4 wheels);Cane - single point   Additional Comments: baseline 4-8 ltrs O2      Prior Functioning/Environment Prior Level of Function : Independent/Modified Independent             Mobility Comments: uses rollator ADLs Comments: indep including tub bath and driving    OT Problem List: Decreased activity tolerance;Impaired balance (sitting and/or standing);Cardiopulmonary status limiting activity;Pain   OT Treatment/Interventions: Self-care/ADL training;Therapeutic exercise;Energy conservation;DME and/or AE instruction;Therapeutic activities;Patient/family education;Balance training      OT Goals(Current goals can be found in the care plan section)   Acute Rehab OT Goals Patient Stated Goal: to get back to my outpatient pulm rehab OT Goal Formulation: With patient Time For Goal Achievement: 10/02/24 Potential to Achieve Goals: Good ADL Goals Pt/caregiver will Perform Home Exercise Program: With theraband;Independently;Increased strength;With written HEP provided Additional ADL Goal #1: Patient will teach back 5/5 ECT principles with B/IADL's indep   OT Frequency:  Min 2X/week       AM-PAC OT 6 Clicks Daily Activity     Outcome Measure Help from another person eating meals?: None Help from another person taking care of personal grooming?: None Help from another person toileting, which includes using toliet, bedpan, or urinal?: None Help from another person bathing (including washing, rinsing, drying)?: None Help from another person to put on and taking off regular upper body clothing?: None Help from another person to put on and taking off regular lower body clothing?: None 6 Click Score: 24   End of Session Equipment Utilized During Treatment: Gait belt;Rollator (4 wheels);Oxygen Nurse Communication: Mobility status  Activity Tolerance: Patient tolerated treatment well Patient  left: in bed;with bed alarm set  OT Visit Diagnosis: Pain Pain - part of body:  (joints)                Time: 1055-1130 OT Time Calculation (min): 35 min Charges:  OT General Charges $OT Visit: 1 Visit OT Evaluation $OT Eval Low Complexity: 1 Low OT Treatments $Therapeutic Activity: 8-22 mins  Charmeka [place] OT/L Acute Rehabilitation Department  [phone]  09/18/2024, 12:45 PM

## 2024-09-18 NOTE — Discharge Summary (Signed)
 Physician Discharge Summary  Dawn Fowler FMW:[id number] DOB: 1966/05/29 DOA: 09/15/2024  PCP: [name], MD  Admit date: 09/15/2024 Discharge date: 09/18/2024  Time spent: 60 minutes  Recommendations for Outpatient Follow-up:  Follow-up with [name], MD in 3 weeks. Follow-up with Dr. Rolan, cardiology in 2 weeks.  Office will call with appointment time. Follow-up with [facility] in 2 to 3 weeks or as previously scheduled.   Discharge Diagnoses:  Principal Problem:   Acute on chronic respiratory failure (HCC) Active Problems:   Pulmonary hypertension (HCC)   Gout   CKD stage 3b, GFR 30-44 ml/min (HCC)   OSA on CPAP   RVF (right ventricular failure) (HCC)   Restrictive lung disease   Chronic GERD   Essential hypertension   Recurrent major depression   History of pulmonary embolism   Acute on chronic diastolic congestive heart failure (HCC)   Class 3 obesity (HCC)   History of DVT (deep vein thrombosis)   Discharge Condition: Stable and improved.  Diet recommendation: Heart healthy  Filed Weights   09/16/24 1538 09/17/24 0623 09/18/24 0500  Weight: 99.1 kg 99 kg 99.3 kg    History of present illness:  HPI per Dr. Celinda Sharlet Fowler is a 58 y.o. female with medical history significant of anxiety, depression, stage III CKD, history of RLE DVT, GERD, hyperlipidemia, hypertension, prediabetes, class III obesity, pseudocholinesterase deficiency, sleep apnea, pulmonary hypertension, acute respiratory failure due to COVID-19, restrictive lung disease who presented to the emergency department due to hypoxia, occasional productive cough of whitish sputum and dizziness.  Her exercise tolerance has decreased significantly.  She stated that they have to increase her O2 requirement all the way to 15 LPM to work with her during pulmonary rehab.  No increase in sodium or fluid intake.  No chest pain, palpitations, diaphoresis, PND, orthopnea or pitting edema of the  lower extremities. She denied fever, chills, rhinorrhea, sore throat, wheezing or hemoptysis.   No abdominal pain, nausea, emesis, diarrhea, constipation, melena or hematochezia.  No flank pain, dysuria, frequency or hematuria.  No polyuria, polydipsia, polyphagia or blurred vision.    Lab work: CBC and D-dimer were normal.  proBNP was 772.0 coronavirus, influenza and RSV PCR test was negative.  CMP showed a glucose of 112, BUN 34 and creatinine 2.21 mg/dL (2 months ago creatinine was 2.36 and 4 months ago 1.82 and 1.94 mg/dL).   Imaging: Portable 1 view chest radiograph showing cardiomegaly with central pulmonary vascular congestion.   ED course: Initial vital signs were temperature 98.4 F, pulse 85, respirations 16, BP 127/98 mmHg O2 sat 95% on room air.  Patient received furosemide  40 mg IVP x 1 dose.  Hospital Course:  #1 acute on chronic respiratory failure likely secondary to acute on chronic diastolic CHF in the setting of pulmonary hypertension. - Patient with history of restrictive lung disease on chronic home O2 of 4 L at baseline and having increased to 10 to 15 L per patient with bouts of hypoxia with sats in the 70s on exertion. - Patient with history of pulmonary hypertension managed with Uptravi , Opsumit , Adempas . - Patient noted to have had recent up titration of Adempas  which per pulmonary may have contributed to her pulmonary edema. - Low suspicion for infectious etiology. - Chest x-ray done on admission with cardiomegaly with central pulmonary vascular congestion. - Repeat chest x-ray with no frank interstitial edema or pleural effusion, enlargement of central pulmonary arteries within the right lung, suggestive of pulmonary arterial hypertension - Repeat  2D echo done with EF of 55 to 60%,NWMA, grade 1 DD, moderately reduced right ventricular systolic function, right ventricular size mildly enlarged, mildly elevated pulmonary arterial systolic pressure with estimated right  ventricular systolic pressure of 39 mmHg.  Mildly dilated right atrial size.  Trivial MVR. -pBNP noted to be elevated on admission at 772. -Patient noted to have received a dose of IV Lasix  initially on admission urine output of 900 cc. - Patient placed on Lasix  40 mg IV every 12 hours. - Patient also maintained on Breo Ellipta , spironolactone . - Patient maintained on home regimen selexipag , tadalafil , Opsumit , riociguat  (continue current dose without uptitration per pulmonary recommendations) -Strict I's and O's, daily weights. - Patient improved clinically, oxygen requirements improved and was back to baseline by day of discharge. -Patient maintained on CPAP nightly. -Patient was followed by pulmonary during the hospitalization and patient discussed with patient's primary cardiologist and updated him on patient's hospitalization. -Patient improved clinically and was cleared by pulmonary for discharge on home regimen of torsemide  as well as spironolactone  and continuation of Optravi, Opsumit , and to keep Adempas  at current dose without titration. -It was recommended to resume patient's home regimen of torsemide  and spironolactone  on discharge with outpatient follow-up in cardiology clinic. -Patient also noted to have been set up for split-night study on 10/15/2024. -Patient was discharged in stable improved condition with outpatient follow-up with cardiology and pulmonary.   2.  Gout - Patient maintained on home regimen allopurinol .   3.  CKD stage IIIb -Slight improvement with creatinine with diuresis. - Patient placed on Lasix  40 mg IV every 12 hours. - Outpatient follow-up with primary nephrologist.   4.  OSA -CPAP nightly.   5.  GERD - Patient maintained on PPI.   6.  Hypertension - Patient initially during hospitalization was placed on Lasix  40 mg IV every 12 hours. - Patient also maintained on home regimen spironolactone  12.5 mg daily, losartan  25 mg daily.   7.  History of  PE/history of DVT - Patient maintained on home regimen Eliquis .   8.  Recurrent major depression -Stable. - Patient maintained on home regimen sertraline  50 mg twice daily   9.  Class III obesity -BMI 43.57 kg/m. - Lifestyle modification. - Outpatient follow-up with PCP.  Procedures: Chest x-ray 09/15/2024, 09/17/2024 2D echo 09/17/2024  Consultations: PCCM/pulmonary: Dr. Layman 09/16/2024    Discharge Exam: Vitals:   09/18/24 0924 09/18/24 1350  BP:  (!) 141/97  Pulse:  78  Resp:    Temp:  97.9 F (36.6 C)  SpO2: 90% (!) 89%    General: NAD Cardiovascular: RRR no murmurs rubs or gallops.  No JVD.  No lower extremity edema. Respiratory: Clear to auscultation bilaterally.  No wheezes, no crackles, no rhonchi.  Fair air movement.  Speaking in full sentences.  Discharge Instructions   Discharge Instructions     Diet - low sodium heart healthy   Complete by: As directed    Increase activity slowly   Complete by: As directed       Allergies as of 09/18/2024       Reactions   Adhesive [tape] Hives   Tolerates paper tape   Latex Dermatitis   Succinylcholine Other (See Comments)   Have trouble waking up   Tizanidine Hives        Medication List     STOP taking these medications    Zepbound  7.5 MG/0.5ML Pen Generic drug: tirzepatide  Replaced by: Zepbound  10 MG/0.5ML Pen  TAKE these medications    acetaminophen  500 MG tablet Commonly known as: TYLENOL  Take 1,000 mg by mouth every 6 (six) hours as needed for moderate pain or headache.   Adempas  2 MG Tabs Generic drug: Riociguat  Take 1 mg by mouth in the morning, at noon, and at bedtime.   albuterol  108 (90 Base) MCG/ACT inhaler Commonly known as: VENTOLIN  HFA Inhale 2 puffs into the lungs every 6 (six) hours as needed for wheezing or shortness of breath.   allopurinol  100 MG tablet Commonly known as: ZYLOPRIM  TAKE 1 TABLET(100 MG) BY MOUTH DAILY   Alyq  20 MG tablet Generic drug:  tadalafil  (PAH) Take 20 mg by mouth daily.   amLODipine  5 MG tablet Commonly known as: NORVASC  Take 1 tablet (5 mg total) by mouth daily.   apixaban  5 MG Tabs tablet Commonly known as: Eliquis  Take 1 tablet (5 mg total) by mouth 2 (two) times daily.   colchicine 0.6 MG tablet Take 0.6 mg by mouth daily as needed (gout flare).   diclofenac  Sodium 1 % Gel Commonly known as: VOLTAREN  Apply 2 g topically 3 (three) times daily as needed (pain.).   empagliflozin  10 MG Tabs tablet Commonly known as: Jardiance  Take 1 tablet (10 mg total) by mouth daily.   famotidine  20 MG tablet Commonly known as: PEPCID  Take 20 mg by mouth as needed for heartburn or indigestion.   fluticasone  furoate-vilanterol 200-25 MCG/ACT Aepb Commonly known as: Breo Ellipta  Inhale 1 puff into the lungs daily.   gabapentin  100 MG capsule Commonly known as: NEURONTIN  Take 100 mg by mouth daily as needed (Pain).   ipratropium-albuterol  0.5-2.5 (3) MG/3ML Soln Commonly known as: DUONEB INHALE THE CONTENTS OF 1 VIAL VIA NEBULIZER EVERY 6 HOURS AS NEEDED FOR WHEEZING OR SHORTNESS OF BREATH   losartan  25 MG tablet Commonly known as: COZAAR  Take 1 tablet (25 mg total) by mouth daily.   Opsumit  10 MG tablet Generic drug: macitentan  Take 1 tablet (10 mg total) by mouth daily.   OXYGEN Inhale 4-8 L into the lungs continuous.   pantoprazole  40 MG tablet Commonly known as: PROTONIX  Take 1 tablet (40 mg total) by mouth daily.   sertraline  50 MG tablet Commonly known as: Zoloft  Take 1 tablet (50 mg total) by mouth 2 (two) times daily.   spironolactone  25 MG tablet Commonly known as: ALDACTONE  TAKE 1/2 TABLET(12.5 MG) BY MOUTH DAILY   torsemide  20 MG tablet Commonly known as: DEMADEX  Take 2 tablets (40 mg total) by mouth daily.   Uptravi  800 MCG Tabs Generic drug: Selexipag  Take 1 tablet (800 mcg total) by mouth 2 (two) times daily.   Vitamin D3 50 MCG (2000 UT) capsule Take 2,000 Units by mouth in  the morning.   Zepbound  10 MG/0.5ML Pen Generic drug: tirzepatide  Inject 10 mg into the skin once a week. Replaces: Zepbound  7.5 MG/0.5ML Pen       Allergies  Allergen Reactions   Adhesive [Tape] Hives    Tolerates paper tape   Latex Dermatitis   Succinylcholine Other (See Comments)    Have trouble waking up   Tizanidine Hives    Follow-up Information     [name], MD. Schedule an appointment as soon as possible for a visit in 3 week(s).   Specialty: Family Medicine Contact information: [address] [place] [address] [phone]         Rolan Ezra RAMAN, MD. Schedule an appointment as soon as possible for a visit in 2 week(s).  Specialty: Cardiology Contact information: [address] [place] [address] [place] [address] [phone]         [facility]. Schedule an appointment as soon as possible for a visit in 3 week(s).   Specialty: Pulmonology Why: Follow-up in 2 to 3 weeks. Contact information: [address] [place]  [address] [phone] Additional information: [address]  [address]  [place], [place] [address]                 The results of significant diagnostics from this hospitalization (including imaging, microbiology, ancillary and laboratory) are listed below for reference.    Significant Diagnostic Studies: ECHOCARDIOGRAM COMPLETE Result Date: 09/17/2024    ECHOCARDIOGRAM REPORT   Patient Name:   Dawn Fowler Date of Exam: 09/17/2024 Medical Rec #:  [id number]       Height:       60.0 in Accession #:    [id number]      Weight:       218.3 lb Date of Birth:  10/04/1966        BSA:          1.937 m Patient Age:    58 years        BP:           112/82 mmHg Patient Gender: F               HR:           73 bpm. Exam Location:  Inpatient Procedure: 2D Echo, Cardiac Doppler and Color Doppler (Both Spectral and Color            Flow Doppler were utilized  during procedure). Indications:    Dyspnea R06.00  History:        Patient has prior history of Echocardiogram examinations, most                 recent 03/18/2024. CHF, pulmonary hypertension,                 Signs/Symptoms:Shortness of Breath and Dyspnea; Risk                 Factors:Hypertension, Sleep Apnea and DVT.  Sonographer:    Koleen Popper RDCS Referring Phys: [id number] TORIBIO GAILS Wilhemina Grall  Sonographer Comments: Patient is obese. Image acquisition challenging due to respiratory motion. IMPRESSIONS  1. Left ventricular ejection fraction, by estimation, is 55 to 60%. The left ventricle has normal function. The left ventricle has no regional wall motion abnormalities. There is mild concentric left ventricular hypertrophy. Left ventricular diastolic parameters are consistent with Grade I diastolic dysfunction (impaired relaxation).  2. Right ventricular systolic function is moderately reduced. The right ventricular size is mildly enlarged. There is mildly elevated pulmonary artery systolic pressure. The estimated right ventricular systolic pressure is 39.0 mmHg.  3. Right atrial size was mildly dilated.  4. The mitral valve is normal in structure. Trivial mitral valve regurgitation. No evidence of mitral stenosis.  5. The aortic valve is normal in structure. Aortic valve regurgitation is not visualized. No aortic stenosis is present.  6. The inferior vena cava is normal in size with <50% respiratory variability, suggesting right atrial pressure of 8 mmHg. FINDINGS  Left Ventricle: Left ventricular ejection fraction, by estimation, is 55 to 60%. The left ventricle has normal function. The left ventricle has no regional wall motion abnormalities. The left ventricular internal cavity size was normal in size. There is  mild concentric left ventricular hypertrophy. Left ventricular diastolic parameters are consistent with Grade I diastolic dysfunction (impaired relaxation). Right Ventricle: The right ventricular size is  mildly enlarged. No increase in right ventricular wall thickness. Right ventricular systolic function is moderately reduced. There is mildly elevated pulmonary artery systolic pressure. The tricuspid regurgitant velocity is 3.00 m/s, and with an assumed right atrial pressure of 3 mmHg, the estimated right ventricular systolic pressure is 39.0 mmHg. Left Atrium: Left atrial size was normal in size. Right Atrium: Right atrial size was mildly dilated. Pericardium: There is no evidence of pericardial effusion. Mitral Valve: The mitral valve is normal in structure. Trivial mitral valve regurgitation. No evidence of mitral valve stenosis. Tricuspid Valve: The tricuspid valve is normal in structure. Tricuspid valve regurgitation is mild . No evidence of tricuspid stenosis. Aortic Valve: The aortic valve is normal in structure. Aortic valve regurgitation is not visualized. No aortic stenosis is present. Pulmonic Valve: The pulmonic valve was normal in structure. Pulmonic valve regurgitation is not visualized. No evidence of pulmonic stenosis. Aorta: The aortic root is normal in size and structure. Venous: The inferior vena cava is normal in size with less than 50% respiratory variability, suggesting right atrial pressure of 8 mmHg. IAS/Shunts: No atrial level shunt detected by color flow Doppler.  LEFT VENTRICLE PLAX 2D LVIDd:         4.70 cm      Diastology LVIDs:         2.80 cm      LV e' medial:  4.35 cm/s LV PW:         1.00 cm      LV e' lateral: 7.62 cm/s LV IVS:        1.10 cm LVOT diam:     1.80 cm LV SV:         56 LV SV Index:   29 LVOT Area:     2.54 cm  LV Volumes (MOD) LV vol d, MOD A2C: 100.0 ml LV vol d, MOD A4C: 101.0 ml LV vol s, MOD A2C: 47.5 ml LV vol s, MOD A4C: 46.7 ml LV SV MOD A2C:     52.5 ml LV SV MOD A4C:     101.0 ml LV SV MOD BP:      54.6 ml RIGHT VENTRICLE             IVC RV Basal diam:  4.50 cm     IVC diam: 2.00 cm RV Mid diam:    3.60 cm RV S prime:     13.70 cm/s TAPSE (M-mode): 1.8 cm  LEFT ATRIUM           Index        RIGHT ATRIUM           Index LA diam:      4.60 cm 2.37 cm/m   RA Area:     20.40 cm LA Vol (A4C): 54.4 ml 28.08 ml/m  RA Volume:   59.40 ml  30.66 ml/m  AORTIC VALVE             PULMONIC VALVE LVOT Vmax:   117.00 cm/s PR End Diast Vel: 9.24 msec LVOT Vmean:  75.800 cm/s LVOT VTI:    0.221 m  AORTA Ao Root diam: 2.70 cm Ao Asc diam:  3.30 cm MR Peak grad: 62.4 mmHg   TRICUSPID VALVE MR Vmax:      395.00 cm/s TR Peak grad:   36.0 mmHg  TR Vmax:        300.00 cm/s                            SHUNTS                           Systemic VTI:  0.22 m                           Systemic Diam: 1.80 cm Toribio Fuel MD Electronically signed by Toribio Fuel MD Signature Date/Time: 09/17/2024/3:32:27 PM    Final    DG CHEST PORT 1 VIEW Result Date: 09/17/2024 EXAM: 1 VIEW(S) XRAY OF THE CHEST 09/17/2024 12:47:00 PM COMPARISON: 09/15/2024 CLINICAL HISTORY: Pulmonary edema FINDINGS: LUNGS AND PLEURA: Enlargement of the central pulmonary arteries within the right lung suggestive of pulmonary arterial hypertension. Unchanged appearance of central pulmonary vascular congestion. No frank interstitial edema. No pleural effusion. No pneumothorax. HEART AND MEDIASTINUM: Stable cardiomegaly. BONES AND SOFT TISSUES: No acute osseous abnormality. IMPRESSION: 1. No frank interstitial edema or pleural effusion. 2. Enlargement of the central pulmonary arteries within the right lung, suggestive of pulmonary arterial hypertension. Electronically signed by: Waddell Calk MD 09/17/2024 02:38 PM EST RP Workstation: [id number]   DG Chest Portable 1 View Result Date: 09/15/2024 CLINICAL DATA:  Shortness of breath EXAM: PORTABLE CHEST 1 VIEW COMPARISON:  Chest x-ray 11/07/2021. FINDINGS: Cardiac silhouette is enlarged. There central pulmonary vascular congestion. There is no lung consolidation, pleural effusion or pneumothorax. No acute fractures are seen. IMPRESSION:  Cardiomegaly with central pulmonary vascular congestion. Electronically Signed   By: Greig Pique M.D.   On: 09/15/2024 21:37    Microbiology: Recent Results (from the past 240 hours)  Resp panel by RT-PCR (RSV, Flu A&B, Covid) Anterior Nasal Swab     Status: None   Collection Time: 09/15/24  8:50 PM   Specimen: Anterior Nasal Swab  Result Value Ref Range Status   SARS Coronavirus 2 by RT PCR NEGATIVE NEGATIVE Final    Comment: (NOTE) SARS-CoV-2 target nucleic acids are NOT DETECTED.  The SARS-CoV-2 RNA is generally detectable in upper respiratory specimens during the acute phase of infection. The lowest concentration of SARS-CoV-2 viral copies this assay can detect is 138 copies/mL. A negative result does not preclude SARS-Cov-2 infection and should not be used as the sole basis for treatment or other patient management decisions. A negative result may occur with  improper specimen collection/handling, submission of specimen other than nasopharyngeal swab, presence of viral mutation(s) within the areas targeted by this assay, and inadequate number of viral copies(<138 copies/mL). A negative result must be combined with clinical observations, patient history, and epidemiological information. The expected result is Negative.  Fact Sheet for Patients:  bloggercourse.com  Fact Sheet for Healthcare Providers:  seriousbroker.it  This test is no t yet approved or cleared by the [place]  FDA and  has been authorized for detection and/or diagnosis of SARS-CoV-2 by FDA under an Emergency Use Authorization (EUA). This EUA will remain  in effect (meaning this test can be used) for the duration of the COVID-19 declaration under Section 564(b)(1) of the Act, 21 U.S.C.section 360bbb-3(b)(1), unless the authorization is terminated  or revoked sooner.       Influenza A by PCR NEGATIVE NEGATIVE Final   Influenza B by PCR NEGATIVE  NEGATIVE Final    Comment: (NOTE) The Xpert  Xpress SARS-CoV-2/FLU/RSV plus assay is intended as an aid in the diagnosis of influenza from Nasopharyngeal swab specimens and should not be used as a sole basis for treatment. Nasal washings and aspirates are unacceptable for Xpert Xpress SARS-CoV-2/FLU/RSV testing.  Fact Sheet for Patients: bloggercourse.com  Fact Sheet for Healthcare Providers: seriousbroker.it  This test is not yet approved or cleared by the [place]  FDA and has been authorized for detection and/or diagnosis of SARS-CoV-2 by FDA under an Emergency Use Authorization (EUA). This EUA will remain in effect (meaning this test can be used) for the duration of the COVID-19 declaration under Section 564(b)(1) of the Act, 21 U.S.C. section 360bbb-3(b)(1), unless the authorization is terminated or revoked.     Resp Syncytial Virus by PCR NEGATIVE NEGATIVE Final    Comment: (NOTE) Fact Sheet for Patients: bloggercourse.com  Fact Sheet for Healthcare Providers: seriousbroker.it  This test is not yet approved or cleared by the [place]  FDA and has been authorized for detection and/or diagnosis of SARS-CoV-2 by FDA under an Emergency Use Authorization (EUA). This EUA will remain in effect (meaning this test can be used) for the duration of the COVID-19 declaration under Section 564(b)(1) of the Act, 21 U.S.C. section 360bbb-3(b)(1), unless the authorization is terminated or revoked.  Performed at [facility], [address], [place], [place] [address]      Labs: Basic Metabolic Panel: Recent Labs  Lab 09/15/24 2045 09/16/24 0500 09/17/24 1103 09/18/24 0606  NA 138 141 135 138  K 3.7 3.9 3.9 3.6  CL 104 105 103 104  CO2 22 23 21* 22  GLUCOSE 102* 90 88 82  BUN 34* 36* 38* 43*  CREATININE 2.21* 2.12* 1.98* 2.19*  CALCIUM 9.9 10.0 9.8  10.0  MG  --  2.3 2.3  --    Liver Function Tests: Recent Labs  Lab 09/15/24 2045 09/16/24 0500  AST 19 20  ALT 12 15  ALKPHOS 95 97  BILITOT 0.4 0.3  PROT 6.9 7.3  ALBUMIN  4.2 4.5   No results for input(s): LIPASE, AMYLASE in the last 168 hours. No results for input(s): AMMONIA in the last 168 hours. CBC: Recent Labs  Lab 09/15/24 2045 09/16/24 0500 09/17/24 1103 09/18/24 0606  WBC 5.9 6.2 5.1 4.8  NEUTROABS 4.1  --  3.6 3.1  HGB 12.4 12.8 12.7 12.2  HCT 39.2 40.6 40.5 39.0  MCV 98.5 98.3 99.5 99.5  PLT 240 274 223 210   Cardiac Enzymes: No results for input(s): CKTOTAL, CKMB, CKMBINDEX, TROPONINI in the last 168 hours. BNP: BNP (last 3 results) Recent Labs    05/13/24 1453 07/16/24 1301 08/13/24 1148  BNP 98.0 72.4 34.3    ProBNP (last 3 results) Recent Labs    09/15/24 2045  PROBNP 772.0*    CBG: Recent Labs  Lab 09/16/24 0831 09/17/24 0739 09/18/24 0753  GLUCAP 94 97 83       Signed:  Toribio Hummer MD.  [facility] 09/18/2024, 3:06 PM

## 2024-09-18 NOTE — Progress Notes (Signed)
 NAME:  Dawn Fowler, MRN:  [id number], DOB:  Dec 27, 1965, LOS: 2 ADMISSION DATE:  09/15/2024, CONSULTATION DATE:  09/16/24 REFERRING MD:  EDP, CHIEF COMPLAINT:  acute on chronic hypoxic resp failure   History of Present Illness:  58 yo female presented at recommendation of primary pulmonology office when she notified the clinic that her oxygen requirement appears to have escalated. Pt states that for the past month she has been increasingly sob with movement and requiring increased oxygen with any mobility. She is on 4L at baseline but having to increase to 10-15 per per report. She states her oxygen level falls to 70's with any movement. She feels heaviness and weakness in her body when she has these episodes, denies dizziness or ha/cp. Pt has no known recent sick contacts. No fever/chills. No n/v/d. Pt has had cough for same duration in the morning when she awakens but reports the sputum is typically clear to slight white. It improves after she is moving around during the day.   Of note pt does have h/o dvt and is on chronic eliquis . She endorses consistency with all her medications and certainly not missing a dose of eliquis .   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  Able to ambulate today.  Objective    Blood pressure 129/81, pulse 72, temperature (!) 97.5 F (36.4 C), temperature source Oral, resp. rate 20, height 5' (1.524 m), weight 99.3 kg, SpO2 90%.        Intake/Output Summary (Last 24 hours) at 09/18/2024 0957 Last data filed at 09/18/2024 0900 Gross per 24 hour  Intake 840 ml   Output 2750 ml  Net -1910 ml   Filed Weights   09/16/24 1538 09/17/24 0623 09/18/24 0500  Weight: 99.1 kg 99 kg 99.3 kg    Examination: Awake, no distress Lungs are clear to auscultation Heart rate regular rate and rhythm.  Labs/imaging reviewed BUN/creatinine 43/2.19 CBC stable  Echo with LVEF 55-60%, grade 1 diastolic dysfunction.  RV systolic function is moderately refused with mild enlargement.  Mildly elevated PA systolic pressure.  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 Managed with Optravi, Opsumit , and Adempas . Recent up titration of Adempas  may have contributed to pulmonary edema. No current exacerbation of asthma or other lung conditions.  Low suspicion for any infection.   - Discussed with Dr. Rolan.  Continue Optravi, Opsumit , and keep Adempas  at current dose without titration - Resume torsemide  and spironolactone . Follow-up in cardiology clinic - Echo reviewed with improvement in RVSP. - Continue bronchodilators, CPAP at night.  She has split-night sleep study scheduled for 12/17  Stable for discharge from our perspective.  PCCM will sign off.  Please call with any questions.  Signature:   Orvan Papadakis MD [facility] See Amion for pager  If no response to pager , please call  [phone] until 7pm After 7:00 pm call Elink  [phone] 09/18/2024, 9:57 AM

## 2024-09-22 ENCOUNTER — Ambulatory Visit (INDEPENDENT_AMBULATORY_CARE_PROVIDER_SITE_OTHER): Admitting: Orthopedic Surgery

## 2024-09-22 DIAGNOSIS — M1711 Unilateral primary osteoarthritis, right knee: Secondary | ICD-10-CM

## 2024-09-23 ENCOUNTER — Encounter (HOSPITAL_COMMUNITY)
Admission: RE | Admit: 2024-09-23 | Discharge: 2024-09-23 | Disposition: A | Discharge status: Home / Self Care | Source: Ambulatory Visit | Attending: Pulmonary Disease | Admitting: Pulmonary Disease

## 2024-09-23 ENCOUNTER — Encounter: Payer: Self-pay | Admitting: Orthopedic Surgery

## 2024-09-23 VITALS — Wt 218.7 lb

## 2024-09-23 DIAGNOSIS — I2729 Other secondary pulmonary hypertension: Secondary | ICD-10-CM | POA: Diagnosis present

## 2024-09-23 MED ORDER — TRIAMCINOLONE ACETONIDE 40 MG/ML IJ SUSP
40.0000 mg | INTRAMUSCULAR | Status: AC | PRN
  Administered 2024-09-22: 40 mg via INTRA_ARTICULAR

## 2024-09-23 MED ORDER — BUPIVACAINE HCL 0.25 % IJ SOLN
4.0000 mL | INTRAMUSCULAR | Status: AC | PRN
  Administered 2024-09-22: 4 mL via INTRA_ARTICULAR

## 2024-09-23 MED ORDER — LIDOCAINE HCL 1 % IJ SOLN
5.0000 mL | INTRAMUSCULAR | Status: AC | PRN
  Administered 2024-09-22: 5 mL

## 2024-09-23 NOTE — Progress Notes (Signed)
 Daily Session Note  Patient Details  Name: Dawn Fowler MRN: [id number] Date of Birth: Mar 03, 1966 Referring Provider:   Conrad Ports Pulmonary Rehab Walk Test from 08/22/2024 in [facility]  Referring Provider [name]    Encounter Date: 09/23/2024  Check In:  Session Check In - 09/23/24 1133       Check-In   Supervising physician immediately available to respond to emergencies [facility] MD immediately available    Physician(s) Rosabel Mose, NP    Location [facility]    Staff Present Ronal Levin, RN, BSN;Merryl Buckels Claudene, Neita Moats, MS, ACSM-CEP, Exercise Physiologist;Randi Midge BS, ACSM-CEP, Exercise Physiologist    Virtual Visit No    Medication changes reported     No    Fall or balance concerns reported    No    Tobacco Cessation No Change    Warm-up and Cool-down Performed as group-led instruction    Resistance Training Performed Yes    VAD Patient? No    PAD/SET Patient? No      Pain Assessment   Currently in Pain? No/denies    Multiple Pain Sites No          Capillary Blood Glucose: No results found for this or any previous visit (from the past 24 hours).   Exercise Prescription Changes - 09/23/24 1200       Response to Exercise   Blood Pressure (Admit) 130/62    Blood Pressure (Exercise) 122/66    Blood Pressure (Exit) 112/72    Heart Rate (Admit) 106 bpm    Heart Rate (Exercise) 109 bpm    Heart Rate (Exit) 98 bpm    Oxygen Saturation (Admit) 93 %    Oxygen Saturation (Exercise) 95 %    Oxygen Saturation (Exit) 94 %    Rating of Perceived Exertion (Exercise) 13    Perceived Dyspnea (Exercise) 2    Duration Continue with 30 min of aerobic exercise without signs/symptoms of physical distress.    Intensity THRR unchanged      Progression   Progression Continue to progress workloads to maintain intensity without signs/symptoms of physical distress.      Resistance Training    Training Prescription Yes    Weight red bands    Reps 10-15    Time 10 Minutes      Oxygen   Oxygen Continuous    Liters 10      Recumbant Bike   Level 1    RPM 46    Watts 10    Minutes 15    METs 1.8      NuStep   Level 1    SPM 74    Minutes 15    METs 1.6      Oxygen   Maintain Oxygen Saturation 88% or higher          Social History   Tobacco Use  Smoking Status Never  Smokeless Tobacco Never    Goals Met:  Proper associated with RPD/PD & O2 Sat Independence with exercise equipment Exercise tolerated well No report of concerns or symptoms today Strength training completed today  Goals Unmet:  Not Applicable  Comments: Service time is from 1021 to 1140.    Dr. Slater [name] is Medical Director for Pulmonary Rehab at [facility].

## 2024-09-23 NOTE — Progress Notes (Signed)
 Office Visit Note   Patient: Dawn Fowler           Date of Birth: Dec 18, 1965           MRN: [id number] Visit Date: 09/22/2024 Requested by: Claudene Lacks, MD [address] [place],  [place] [address] PCP: [name], MD  Subjective: Chief Complaint  Patient presents with   Right Knee - Follow-up, Pain    HPI: Dawn Fowler is a 58 y.o. female who presents to the office reporting right knee pain.  Patient has had prior tibial nail and femoral nail plates.  Had prior injection 05/30/2024 which also helped.  She was just recently hospitalized with pulmonary edema.  This is secondary to cardiac meds per her report.  Overall she is doing better now.  Still describes a lot of painful ambulation with her right knee symptoms and arthritis.  She is almost but not quite ready for knee replacement..                ROS: All systems reviewed are negative as they relate to the chief complaint within the history of present illness.  Patient denies fevers or chills.  Assessment & Plan: Visit Diagnoses:  1. Unilateral primary osteoarthritis, right knee     Plan: Impression is right knee arthritis with mild effusion today.  Aspiration and injection performed today.  We will template the need to see which kind of implant could not be put in based on her tibial nail.  I think the femoral nail is a little less problematic.  Nonetheless when she decides she wants knee replacement she will let us  know and we will start that templating process.  The risk and benefits of knee replacement are discussed with the patient including not limited to infection or vessel damage incomplete pain relief as well as the rigorous nature of the rehabilitative process required.  She would also be someone that we would really try to do a spinal anesthetic with due to her pulmonary issues.  She will follow-up with us  as needed.  Follow-Up Instructions: No follow-ups on file.   Orders:  No orders of the defined types were  placed in this encounter.  No orders of the defined types were placed in this encounter.     Procedures: Large Joint Inj: R knee on 09/22/2024 10:54 AM Indications: diagnostic evaluation, joint swelling and pain Details: 18 G 1.5 in needle, superolateral approach  Arthrogram: No  Medications: 5 mL lidocaine  1 %; 4 mL bupivacaine  0.25 %; 40 mg triamcinolone  acetonide 40 MG/ML Outcome: tolerated well, no immediate complications Procedure, treatment alternatives, risks and benefits explained, specific risks discussed. Consent was given by the patient. Immediately prior to procedure a time out was called to verify the correct patient, procedure, equipment, support staff and site/side marked as required. Patient was prepped and draped in the usual sterile fashion.       Clinical Data: No additional findings.  Objective: Vital Signs: LMP  (LMP Unknown) Comment: pmb last 2-3 months  Physical Exam:  Constitutional: Patient appears well-developed HEENT:  Head: Normocephalic Eyes:EOM are normal Neck: Normal range of motion Cardiovascular: Normal rate Pulmonary/chest: Effort normal Neurologic: Patient is alert Skin: Skin is warm Psychiatric: Patient has normal mood and affect  Ortho Exam: Right knee has range of motion of 5-1 20 with well-healed surgical incision over the tibial tubercle.  Mild effusion is present.  Collateral cruciate ligaments are stable.  Patella mobility is normal.  No other masses lymphadenopathy or  skin changes noted in that right knee region  Specialty Comments:  No specialty comments available.  Imaging: No results found.   PMFS History: Patient Active Problem List   Diagnosis Date Noted   Acute on chronic respiratory failure (HCC) 09/16/2024   Chronic GERD 09/16/2024   De Quervain's tenosynovitis 09/16/2024   Essential hypertension 09/16/2024   Recurrent major depression 09/16/2024   History of pulmonary embolism 09/16/2024   Acute on chronic  diastolic congestive heart failure (HCC) 09/16/2024   Class 3 obesity (HCC) 09/16/2024   History of DVT (deep vein thrombosis) 09/16/2024   Knee pain 12/25/2023   Chronic diarrhea 08/07/2022   Breast hypertrophy 12/20/2021   Chronic diastolic (congestive) heart failure (HCC) 06/10/2020   Restrictive lung disease 05/11/2020   RVF (right ventricular failure) (HCC)    Exertional dyspnea    Chronic hypoxemic respiratory failure (HCC) 11/20/2019   OSA on CPAP 11/20/2019   Complex care coordination 11/20/2019   Morbid obesity with BMI of 45.0-49.9, adult (HCC) 11/20/2019   Pulmonary hypertension (HCC) 10/30/2019   Gout 10/30/2019   CKD stage 3b, GFR 30-44 ml/min (HCC) 10/30/2019   Vitamin D  deficiency 07/11/2015   Homelessness 02/04/2015   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   History of hypothyroidism 02/27/2014   Past Medical History:  Diagnosis Date   Acute respiratory failure due to COVID-19 (HCC) 10/30/2019   Anxiety    Arthritis    CHF (congestive heart failure) (HCC)    CKD (chronic kidney disease), stage III (HCC)    Complication of anesthesia    hard to wake up after general anesthesia - had trouble waking up after succinylcholine (consider possibility of pseudocholinesterase deficiency)   COVID-19 virus detected 11/20/2019   Tested positive for Covid in December/2020 Required hospitalization in December/2020 through January/2021 Received Redemsivir   Depression    DVT (deep venous thrombosis) (HCC) 06/09/2020   RLE   Dyspnea    Family history of adverse reaction to anesthesia    hard to wake up after general anesthesia   GERD (gastroesophageal reflux disease)    HLD (hyperlipidemia)    HTN (hypertension)    Pre-diabetes    no meds   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; 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, although unsure if she had confirmation testing done   Pulmonary HTN (HCC)    Sleep apnea    on  CPAP   Trichomonas infection     Family History  Problem Relation Age of Onset   COPD Father    Cancer Mother        gallbladder and bladder    Hypertension Brother    Breast cancer Other    Diabetes Son    Breast cancer Cousin    Breast cancer Cousin    Pulmonary Hypertension Neg Hx     Past Surgical History:  Procedure Laterality Date   BREAST REDUCTION SURGERY Bilateral 12/20/2021   Procedure: bilateral breast reduction with free nipple graft;  Surgeon: Marene Sieving, MD;  Location[facility] OR;  Service: Plastics;  Laterality: Bilateral;  4 hours   CARPAL TUNNEL RELEASE Bilateral    CESAREAN SECTION     COLONOSCOPY WITH PROPOFOL  N/A 03/12/2020   Procedure: COLONOSCOPY WITH PROPOFOL ;  Surgeon: Rollin Dover, MD;  Location: [facility] ENDOSCOPY;  Service: Endoscopy;  Laterality: N/A;   DILATATION & CURETTAGE/HYSTEROSCOPY WITH MYOSURE N/A 04/08/2020   Procedure: DILATATION & CURETTAGE/HYSTEROSCOPY WITH MYOSURE;  Surgeon: Lavoie, Marie-Lyne, MD;  Location: [facility] OR;  Service: Gynecology;  Laterality: N/A;  request 8:30am OR start-requested through [facility] for [facility] Gyn requests one hour   HEMOSTASIS CLIP PLACEMENT  03/12/2020   Procedure: HEMOSTASIS CLIP PLACEMENT;  Surgeon: Rollin Dover, MD;  Location: [facility] ENDOSCOPY;  Service: Endoscopy;;   LEG SURGERY Right 1988   metal rod   lumbar surgery     POLYPECTOMY  03/12/2020   Procedure: POLYPECTOMY;  Surgeon: Rollin Dover, MD;  Location: [facility] ENDOSCOPY;  Service: Endoscopy;;   RIGHT HEART CATH N/A 12/18/2019   Procedure: RIGHT HEART CATH;  Surgeon: Rolan Ezra RAMAN, MD;  Location: [facility] INVASIVE CV LAB;  Service: Cardiovascular;  Laterality: N/A;   RIGHT HEART CATH N/A 07/12/2020   Procedure: RIGHT HEART CATH;  Surgeon: Rolan Ezra RAMAN, MD;  Location: [facility] INVASIVE CV LAB;  Service: Cardiovascular;  Laterality: N/A;   RIGHT HEART CATH N/A 07/18/2024   Procedure: RIGHT HEART CATH;  Surgeon: Rolan Ezra RAMAN, MD;  Location: [facility] INVASIVE CV LAB;  Service:  Cardiovascular;  Laterality: N/A;   Social History   Occupational History   Not on file  Tobacco Use   Smoking status: Never   Smokeless tobacco: Never  Vaping Use   Vaping status: Never Used  Substance and Sexual Activity   Alcohol use: Not Currently   Drug use: Not Currently   Sexual activity: Not Currently    Partners: Male    Birth control/protection: Post-menopausal    Comment: 1st intercourse- 32, partners- more than 5

## 2024-09-24 ENCOUNTER — Other Ambulatory Visit: Payer: Self-pay

## 2024-09-24 ENCOUNTER — Ambulatory Visit: Attending: Pulmonary Disease

## 2024-09-24 DIAGNOSIS — R2689 Other abnormalities of gait and mobility: Secondary | ICD-10-CM | POA: Insufficient documentation

## 2024-09-24 DIAGNOSIS — M6281 Muscle weakness (generalized): Secondary | ICD-10-CM | POA: Insufficient documentation

## 2024-09-24 NOTE — Therapy (Signed)
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 and overall width changes  throughout the wheels for adjustment of the client's exact needs and abilities.   []  adjustable axle increases the stability and function of the chair allowing for adjustment of the center of gravity.   []  accommodates the client's anatomical position in the chair maximizing independence in mobility and maneuverability in all environments.   []  create a minimal fixed tilt-in space to assist in positioning.   []  Describe users full-time manual wheelchair activity patterns:___    []  Power assist Comments:  []  prevent repetitive use injuries  []  repetitive strain injury present in    shoulder girdle    []  shoulder pain is (> or =) to 7/10     during manual propulsion       Current Pain _____/10  []  requires conservation of energy to participate in MRADL(s) runable to propel up ramps or curbs using manual wheelchair  []  been  [id number] user greater than one year  []  user unwilling to use power      wheelchair (reason): []  less expensive option to power   wheelchair   []  rim activated power assist -      decreased strength   []  Heavy duty manual wheelchair       [id number]     Arm:     []  both []  right  []  left     Foot:   []  both []  right  []  left     []  hemi height required    []  Dependent base  []  user exceeds 250lbs  []  non-functional ambulator    []  extreme spasticity  []  over active movement   []  broken frame/hx of repeated     repairs  []  able to self-propel in residence       []  lower seat to floor height required  []  unable to self-propel in residence   []  Extra heavy duty manual wheelchair  [id number]     Arm:    []  both []  right  []  left     Foot:   []  both []  right  []  left     []  hemi height required  []  Dependent base  []  user exceeds 300lbs  []  non-functional ambulator    []  able to self-propel in residence   []  lower seat to floor height required  []  unable to self-propel in residence     []  Manual wheelchair with tilt [phone]      (Manual "Tilt-n-Space")  []  patient is dependent for transfers  []  patient requires frequent       positioning for pressure relief   []  patient requires frequent      positioning for poor/absent trunk control        []  Stroller Base  []  infant/child   []  unable to propel manual      wheelchair  []  allows for growth  []  non-functional ambulator  []  non-functional UE  []  independent mobility is not a goal at this time    MANUAL FRAME OPTIONS      Push handles  []  extended   []  angle adjustable   []  standard  []  caregiver access  []  caregiver assist    []  allows "hooking" to enable      increased ability to perform ADLs or maintain balance   []  Angle Adjustable Back  []  postural control  []  control of tone/spasticity  []  accommodation of range of motion  []  UE functional control  []  accommodation for seating system    Rear wheel placement  []  std/fixed  [] fully adjustableramputee   []  camber ________degree  []  removable rear wheel  []  non-removable rear wheel  Wheel size _______  Wheel style_______________________  []  improved UE access to wheels  []  increase propulsion ability  []  improved stability  []   changing angle in space for      improvement of postural stability  []  remove for transport    []  allow for seating system to fit on  base  []  amputee placement  []  1-arm drive access   r R  r L  []  enable propulsion of manual       wheelchair with one arm    []  amputee placement   Wheel rims/ Hand rims  []  Standard    []  Specialized-____ []  provide ability to propel manual   []  increase self-propulsion with hand wheelchair weakness/decreased grasp     []  Spoke protector/guard   []   prevent hands from getting caught in spokes   Tires:  []  pneumatic  []  flat free inserts  []  solid  Style:  []  decrease roll resistance              []  prevent frequent flats  []  increase shock absorbency  []  decrease maintenance   []  decrease pain from road shock    []  decrease spasms from road shock    Wheel Locks:    []  push []  pull []  scissor  []  lock wheels for transfers  []  lock wheels from rolling   Brake/wheel lock extension:  []  R  []  L  []  allow user to operate wheel locks due to decreased reach or strength   Caster housing:  Caster size:                      Style:                                          []  suspension fork  []  maneuverability   []  stability of wheelchair   []  durability  []  maintenance  []  angle adjustment for posture  []  allow for feet to come under        wheelchair base  []  allows change in seat to floor  height   []  increase shock absorbency  []  decrease pain from road shock  []  decrease spasms from road    shock   []  Side guards  []  prevent clothing getting caught in wheel or becoming soiled   [] provide hip and pelvic stability  []  eliminates contact between body and wheels  []  limit hand contact with wheels   []  Anti-tippers      []  prevent wheelchair from tipping    backward  []  assist caregiver with curbs     POWER MOBILITY      []  Scooter/POV    []  can safely operate   []  can safely transfer   []  has adequate trunk stability   []  cannot functionally  propel  manual wheelchair    [x]  Power mobility base    []  non-ambulatory   [x]  cannot functionally propel manual wheelchair   [x]  cannot functionally and safely      operate scooter/POV  [x]  can safely operate power       wheelchair  [x]  home is accessible  [x]  willing to use power wheelchair     Tilt  [x]  Powered tilt on powered chair  []  Powered tilt on manual chair  []  Manual tilt on manual chair Comments:  []  change position for pressure      []  elief/cannot weight shift   []  change position against      gravitational force on head and      shoulders   []  decrease pain  [x]  blood pressure management   []  control autonomic dysreflexia  [x]  decrease respiratory distress  []  management of spasticity  []  management of low tone  []  facilitate postural control   [x]  rest periods   [x]  control edema  [x]  increase sitting tolerance   []  aid with transfers     Recline   []  Power recline on power chair  []  Manual recline on manual chair  Comments:    []  intermittent catheterization  []  manage spasticity  []  accommodate femur to back angle  []  change position for pressure relief/cannot weight  shift rhigh risk of pressure sore development  []  tilt alone does not accomplish     effective pressure relief, maximum pressure relief achieved at -      _______ degrees tilt   _______ degrees recline   []  difficult to transfer to and from bed []  rest periods and sleeping in chair  []  repositioning for transfers  []  bring to full recline for ADL care  []  clothing/diaper changes in chair  []  gravity PEG tube feeding  []  head positioning  []  decrease pain  []  blood pressure management   []  control autonomic dysreflexia  []  decrease respiratory distress  []  user on ventilator     Elevator on mobility base  []  Power wheelchair  []  Scooter  []  increase Indep in transfers   []  increase Indep in ADLs    []  bathroom function and safety  []  kitchen/cooking function and safety  []   shopping  []  raise height for communication at standing level  []  raise height for eye contact which reduces cervical neck strain and pain  []  drive at raised height for safety and navigating crowds  []  Other:   []  Vertical position system  (anterior tilt)     (Drive locks-out)    []  Stand       (Drive enabled)  []  independent weight bearing  []  decrease joint contractures  []  decrease/manage spasticity  []  decrease/manage spasms  []  pressure distribution away from   scapula, sacrum, coccyx, and ischial tuberosity  []  increase digestion and elimination   []  access to counters and cabinets  []  increase reach  []  increase interaction with others at eye level, reduces neck strain  []  increase performance of       MRADL(s)      Power elevating legrest    []  Center mount (Single) 85-170 degrees       []  Standard (Pair) 100-170 degrees  []  position legs at 90 degrees, not available with std power ELR  []  center mount tucks into chair to decrease turning radius in home, not available with std power ELR  []  provide change in position for LE  []  elevate legs during recline    []  maintain placement of feet on      footplate  []  decrease edema  []  improve circulation  []  actuator needed to elevate legrest  []  actuator needed to articulate legrest preventing knees from flexing  []  Increase ground clearance over      curbs  []   STD (pair) independently                     elevate legrest   POWER WHEELCHAIR CONTROLS      Controls/input device  []  Expandable  [x]  Non-expandable  []  Proportional  [x]  Right Hand []  Left Hand  []  Non-proportional/switches/head-array  []  Electrical/proximity         []   Mechanical      Manufacturer:___________________   Type:________________________ [x]  provides access for controlling wheelchair  [x]  programming for accurate control  []  progressive disease/changing condition  []  required for alternative drive      controls       []  lacks motor control  to operate  proportional drive control  []  unable to understand proportional controls  []  limited movement/strength  []  extraneous movement / tremors / ataxic / spastic       [x]  Upgraded electronics controller/harness    [x]  Single power (tilt or recline)   []  Expandable    []   Non-expandable plus   []  Multi-power (tilt, recline, power legrest, power seat lift, vertical positioning system, stand)  [x]  allows input device to communicate with drive motors  [x]  harness provides necessary connections between the controller, input device, and seat functions     [x]  needed in order to operate power seat functions through joystick/ input device  [x]  required for alternative drive controls     []  Enhanced display  []  required to connect all alternative drive controls   []  required for upgraded joystick      (lite-throw, heavy duty, micro)  []  Allows user to see in which mode and drive the wheelchair is set; necessary for alternate controls       []  Upgraded tracking electronics  []  correct tracking when on uneven surfaces makes switch driving more efficient and less fatiguing  []  increase safety when driving  []  increase ability to traverse thresholds    []  Safety / reset / mode switches     Type:    []  Used to change modes and stop the wheelchair when driving     [x]  Mount for joystick / input device/switches  []  swing away for access or transfers   [x]  attaches joystick / input device / switches to wheelchair   [x]  provides for consistent access  [x]  midline for optimal placement    []  Attendant controlled joystick plus     mount  []  safety  []  long distance driving  []  operation of seat functions  []  compliance with transportation regulations    [x]  Battery NF22 x 2 [x]  required to power (power assist / scooter/ power wc / other):   []  Power inverter (24V to 12V)  []  required for ventilator / respiratory equipment / other:     CHAIR OPTIONS MANUAL & POWER      Armrests   [x]   adjustable height []  removable  []  swing away []  fixed  [x]  flip back  []  reclining  []  full length pads []  desk []  tube arms []  gel pads  [x]  provide support with elbow at 90    [x]  remove/flip back/swing away for  transfers  [x]  provide support and positioning of upper body    []  allow to come closer to table top  []  remove for access to tables  [x]  provide support for w/c tray  []  change of height/angles for variable activities   []  Elbow support / Elbow stop  []  keep elbow positioned on arm pad  []  keep arms from falling off arm pad  during tilt and/or recline   Upper Extremity Support  []  Arm trough  []   R  []   L  Style:  []  swivel mount []  fixed mount   []  posterior hand support  []   tray  []  full tray  []  joystick cut out  []   R  []   L  Style:  []  decrease gravitational pull on      shoulders  []  provide support to increase UE  function  []  provide hand support in natural    position  []  position flaccid UE  []  decrease subluxation    []  decrease edema       []  manage spasticity   []  provide midline positioning  []  provide work surface  []  placement for AAC/ Computer/ EADL       Hangers/ Legrests   []  ______ degree  []  Elevating []  articulating  []  swing away []  fixed []  lift off  []  heavy duty  []   adjustable knee angle  []  adjustable calf panel   []  longer extension tube              []  provide LE support  []  maintain placement of feet on      footplate   []  accommodate lower leg length  []  accommodate to hamstring       tightness  []  enable transfers  []  provide change in position for LE's  []  elevate legs during recline    []  decrease edema  []  durability      Foot support   [x]  footplate []  R []  L [x]  flip up           []  Depth adjustable   []  angle adjustable  []  foot board/one piece    [x]  provide foot support  []  accommodate to ankle ROM  []  allow foot to go under wheelchair base  []  enable transfers     []  Shoe holders  []  position foot    []   decrease / manage spasticity  []  control position of LE  []  stability    []  safety     []  Ankle strap/heel      loops  []  support foot on foot support  []  decrease extraneous movement  []  provide input to heel   []  protect foot     []  Amputee adapter []  R  []  L     Style:                  Size:  []  Provide support for stump/residual extremity    []  Transportation tie-down  []  to provide crash tested tie-down brackets    []  Crutch/cane holder    [x]  O2 holder    []  IV hanger   []  Ventilator tray/mount    [x]  stabilize accessory on wheelchair       Component  Justification     [x]  Seat cushion      [x]  accommodate impaired sensation  []  decubitus ulcers present or history  []  unable to shift weight  []  increase pressure distribution  []  prevent pelvic extension  []  custom required "off-the-shelf"    seat cushion will not accommodate deformity  []  stabilize/promote pelvis alignment  []  stabilize/promote femur alignment  []  accommodate obliquity  []  accommodate multiple deformity  []  incontinent/accidents  []  low maintenance     []  seat mounts                 []  fixed []  removable  []  attach seat platform/cushion to wheelchair frame    []  Seat wedge    []  provide increased aggressiveness of seat shape to decrease sliding  down in the seat  []  accommodate ROM        []  Cover replacement   []  protect back or seat cushion  []  incontinent/accidents    []  Solid seat / insert    []  support cushion to prevent      hammocking  []  allows attachment of cushion to mobility base    []  Lateral pelvic/thigh/hip     support (Guides)     []  decrease abduction  []  accommodate pelvis  []  position upper legs  []  accommodate spasticity  []  removable for transfers     []  Lateral pelvic/thigh      supports mounts  []  fixed   []  swing-away   []  removable  []  mounts lateral pelvic/thigh supports     []  mounts lateral pelvic/thigh supports swing-away or removable for transfers    []   Medial thigh  support (Pommel)  [] decrease adduction  [] accommodate ROM  []  remove for transfers   []  alignment      []  Medial thigh   []  fixed      support mounts      []  swing-away   []  removable  []  mounts medial thigh supports   []  Mounts medial supports swing- away or removable for transfers       Component  Justification   [x]  Back       [x]  provide posterior trunk support []  facilitate tone  []  provide lumbar/sacral support []  accommodate deformity  []  support trunk in midline   []  custom required "off-the-shelf" back support will not accommodate deformity   []  provide lateral trunk support []  accommodate or decrease tone            []  Back mounts  []  fixed  []  removable  []  attach back rest/cushion to wheelchair frame   []  Lateral trunk      supports  []  R []  L  []  decrease lateral trunk leaning  []  accommodate asymmetry    []  contour for increased contact  []  safety    []  control of tone    []  Lateral trunk      supports mounts  []  fixed  []  swing-away   []  removable  []  mounts lateral trunk supports     []  Mounts lateral trunk supports swing-away or removable for transfers   []  Anterior chest      strap, vest     []  decrease forward movement of shoulder  []  decrease forward movement of trunk  []  safety/stability  []  added abdominal support  []  trunk alignment  []  assistance with shoulder control   []  decrease shoulder elevation    [x]  Headrest      [x]  provide posterior head support  []  provide posterior neck support  []  provide lateral head support  []  provide anterior head support  []  support during tilt and recline  []  improve feeding     []  improve respiration  []  placement of switches  []  safety    []  accommodate ROM   []  accommodate tone  []  improve visual orientation   [x]  Headrest           []  fixed [x]  removable []  flip down      Mounting hardware   []  swing-away laterals/switches  [x]  mount headrest   []  mounts headrest flip down or  removable for transfers  []   mount headrest swing-away laterals   []  mount switches     []  Neck Support    []  decrease neck rotation  []  decrease forward neck flexion   Pelvic Positioner    [x]  std hip belt          []  padded hip belt  []  dual pull hip belt  []  four point hip belt  [x]  stabilize tone  []  decrease falling out of chair  []  prevent excessive extension  []  special pull angle to control      rotation  []  pad for protection over boney   prominence  []  promote comfort    []  Essential needs        bag/pouch   []  medicines []  special food rorthotics []  clothing changes  []  diapers  []  catheter/hygiene []  ostomy supplies   The above equipment has a life- long use expectancy.  Growth and changes in medical and/or functional conditions would be the exceptions.   SUMMARY:  ASSESSMENT: Timed up and go test: 23 sec with Rolator  CLINICAL IMPRESSION: Patient is a 58 y.o. female who was seen today for physical therapy evaluation and treatment for power mobility device evaluation. Patient has PMH that is significant for Hx of chronic respiratory failure 2* restrictive lung disease-O2 dep (6-8 L at rest and 10-15 L with activity), pulm HTN, OSA, CKD, obesity, PE,COVID.  Patient was recently admitted in the hospital for acute respiratory distress.   Objective findings from today's session indicate decreased LE strength, decreased bil grip strength, LE edema, decreased respiratory endurance. Patient demonstrated 23 seconds of Timed up and Go test which indicates decreased functional mobility and increased fall risk. Patient is currently relying on 6L-15L of O2 depending on her O2 levels and activity level limiting her functional endurance at home.   Manual wheelchair will not be medically safe due to required exertion and due to significantly impaired cardiopulmonary function  The least cost effective device that will help improve patient's function and independence will be a Group 2 power wheelchair with power tilt  function added. Power tilt will be medically necessary due to LE edema, decrease respiratory distress and provide rest breaks. Patient will also benefit from an O2 tank holder installed on her power chair to help her carry her Oxygen tanks safely.   OBJECTIVE IMPAIRMENTS Abnormal gait, cardiopulmonary status limiting activity, decreased activity tolerance, decreased balance, decreased endurance, decreased mobility, difficulty walking, decreased strength, dizziness, increased edema, impaired flexibility, postural dysfunction, and pain.   ACTIVITY LIMITATIONS carrying, lifting, standing, squatting, stairs, locomotion level, and caring for others  PARTICIPATION LIMITATIONS: meal prep, cleaning, laundry, shopping, and community activity  PERSONAL FACTORS Age, Past/current experiences, Time since onset of injury/illness/exacerbation, and 3+ comorbidities:  J96.11 (ICD-10-CM) - Chronic respiratory failure with hypoxia I27.20 (ICD-10-CM) - Pulmonary hypertension, unspecified R29.898 (ICD-10-CM) - Other symptoms and signs involving the musculoskeletal systemare also affecting patient's functional outcome.   REHAB POTENTIAL: Good  CLINICAL DECISION MAKING: Stable/uncomplicated  EVALUATION COMPLEXITY: High                                   GOALS: One time visit. No goals established.    PLAN: PT FREQUENCY: one time visit    [name], PT 09/24/2024, 10:22 AM    I concur with the above findings and recommendations of the therapist:  Physician name printed:         Physician's signature:      Date:

## 2024-09-24 NOTE — Progress Notes (Signed)
 Pulmonary Individual Treatment Plan  Patient Details  Name: Dawn Fowler MRN: [id number] Date of Birth: 1966-10-09 Referring Provider:   Conrad Ports Pulmonary Rehab Walk Test from 08/22/2024 in [facility]  Referring Provider [name]    Initial Encounter Date:  Flowsheet Row Pulmonary Rehab Walk Test from 08/22/2024 in [facility]  Date 08/22/24    Visit Diagnosis: Other secondary pulmonary hypertension (HCC)  Patient's Home Medications on Admission:   Current Outpatient Medications:    acetaminophen  (TYLENOL ) 500 MG tablet, Take 1,000 mg by mouth every 6 (six) hours as needed for moderate pain or headache., Disp: , Rfl:    albuterol  (VENTOLIN  HFA) 108 (90 Base) MCG/ACT inhaler, Inhale 2 puffs into the lungs every 6 (six) hours as needed for wheezing or shortness of breath., Disp: 8 g, Rfl: 5   allopurinol  (ZYLOPRIM ) 100 MG tablet, TAKE 1 TABLET(100 MG) BY MOUTH DAILY, Disp: 30 tablet, Rfl: 2   ALYQ  20 MG tablet, Take 20 mg by mouth daily., Disp: , Rfl:    amLODipine  (NORVASC ) 5 MG tablet, Take 1 tablet (5 mg total) by mouth daily., Disp: 90 tablet, Rfl: 3   apixaban  (ELIQUIS ) 5 MG TABS tablet, Take 1 tablet (5 mg total) by mouth 2 (two) times daily., Disp: 60 tablet, Rfl: 9   Cholecalciferol  (VITAMIN D3) 50 MCG (2000 UT) capsule, Take 2,000 Units by mouth in the morning., Disp: , Rfl:    colchicine 0.6 MG tablet, Take 0.6 mg by mouth daily as needed (gout flare)., Disp: , Rfl:    diclofenac  Sodium (VOLTAREN ) 1 % GEL, Apply 2 g topically 3 (three) times daily as needed (pain.)., Disp: , Rfl:    empagliflozin  (JARDIANCE ) 10 MG TABS tablet, Take 1 tablet (10 mg total) by mouth daily., Disp: 30 tablet, Rfl: 6   famotidine  (PEPCID ) 20 MG tablet, Take 20 mg by mouth as needed for heartburn or indigestion., Disp: , Rfl:    fluticasone  furoate-vilanterol (BREO ELLIPTA )  200-25 MCG/ACT AEPB, Inhale 1 puff into the lungs daily., Disp: 60 each, Rfl: 11   gabapentin  (NEURONTIN ) 100 MG capsule, Take 100 mg by mouth daily as needed (Pain)., Disp: , Rfl:    ipratropium-albuterol  (DUONEB) 0.5-2.5 (3) MG/3ML SOLN, INHALE THE CONTENTS OF 1 VIAL VIA NEBULIZER EVERY 6 HOURS AS NEEDED FOR WHEEZING OR SHORTNESS OF BREATH, Disp: 1080 mL, Rfl: 2   losartan  (COZAAR ) 25 MG tablet, Take 1 tablet (25 mg total) by mouth daily., Disp: 90 tablet, Rfl: 3   macitentan  (OPSUMIT ) 10 MG tablet, Take 1 tablet (10 mg total) by mouth daily., Disp: 90 tablet, Rfl: 3   OXYGEN, Inhale 4-8 L into the lungs continuous., Disp: , Rfl:    pantoprazole  (PROTONIX ) 40 MG tablet, Take 1 tablet (40 mg total) by mouth daily., Disp: 30 tablet, Rfl: 11   Riociguat  (ADEMPAS ) 2 MG TABS, Take 1 mg by mouth in the morning, at noon, and at bedtime., Disp: , Rfl:    Selexipag  (UPTRAVI ) 800 MCG TABS, Take 1 tablet (800 mcg total) by mouth 2 (two) times daily., Disp: 60 tablet, Rfl: 0   sertraline  (ZOLOFT ) 50 MG tablet, Take 1 tablet (50 mg total) by mouth 2 (two) times daily., Disp: 60 tablet, Rfl: 0   spironolactone  (ALDACTONE ) 25 MG tablet, TAKE 1/2 TABLET(12.5 MG) BY MOUTH DAILY, Disp: 45 tablet, Rfl: 3   tirzepatide  (ZEPBOUND ) 10 MG/0.5ML Pen, Inject 10 mg into  the skin once a week., Disp: 2 mL, Rfl: 0   torsemide  (DEMADEX ) 20 MG tablet, Take 2 tablets (40 mg total) by mouth daily., Disp: 60 tablet, Rfl: 6  Past Medical History: Past Medical History:  Diagnosis Date   Acute respiratory failure due to COVID-19 (HCC) 10/30/2019   Anxiety    Arthritis    CHF (congestive heart failure) (HCC)    CKD (chronic kidney disease), stage III (HCC)    Complication of anesthesia    hard to wake up after general anesthesia - had trouble waking up after succinylcholine (consider possibility of pseudocholinesterase deficiency)   COVID-19 virus detected 11/20/2019   Tested positive for Covid in December/2020 Required  hospitalization in December/2020 through January/2021 Received Redemsivir   Depression    DVT (deep venous thrombosis) (HCC) 06/09/2020   RLE   Dyspnea    Family history of adverse reaction to anesthesia    hard to wake up after general anesthesia   GERD (gastroesophageal reflux disease)    HLD (hyperlipidemia)    HTN (hypertension)    Pre-diabetes    no meds   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; 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, although unsure if she had confirmation testing done   Pulmonary HTN (HCC)    Sleep apnea    on CPAP   Trichomonas infection     Tobacco Use: Social History   Tobacco Use  Smoking Status Never  Smokeless Tobacco Never    Labs: Review Flowsheet  More data may exist      Latest Ref Rng & Units 10/29/2019 12/18/2019 07/12/2020 03/18/2024 07/18/2024  Labs for ITP Cardiac and Pulmonary Rehab  Trlycerides <150 mg/dL 862  - - - -  Hemoglobin A1c 4.8 - 5.6 % - - - 5.3  -  Bicarbonate 20.0 - 28.0 mmol/L 20.0 - 28.0 mmol/L - 23.8  24.8  26.9  25.6  26.4  25.6  28.2  26.7  25.1  26.4  - 21.8  22.4   TCO2 22 - 32 mmol/L 22 - 32 mmol/L - 25  26  28  27  28  27  30  28  26  28  24   - 23  24   Acid-base deficit 0.0 - 2.0 mmol/L 0.0 - 2.0 mmol/L - 3.0  2.0  1.0  - 4.0  4.0   O2 Saturation % % - 61.0  63.0  65.0  65.0  66.0  66.0  70.0  61.0  75.0  72.0  - 69  68     Details       Multiple values from one day are sorted in reverse-chronological order         Capillary Blood Glucose: Lab Results  Component Value Date   GLUCAP 83 09/18/2024   GLUCAP 97 09/17/2024   GLUCAP 94 09/16/2024   GLUCAP 86 07/18/2024   GLUCAP 140 (H) 12/21/2021     Pulmonary Assessment Scores:  Pulmonary Assessment Scores     Row Name 08/22/24 1220         ADL UCSD   ADL Phase Entry     SOB Score total 62       CAT Score   CAT Score 20       mMRC Score   mMRC Score 4        UCSD: Self-administered rating of dyspnea associated with activities of daily living (ADLs) 6-point scale (0 = not at all  to 5 = maximal or unable to do because of breathlessness)  Scoring Scores range from 0 to 120.  Minimally important difference is 5 units  CAT: CAT can identify the health impairment of COPD patients and is better correlated with disease progression.  CAT has a scoring range of zero to 40. The CAT score is classified into four groups of low (less than 10), medium (10 - 20), high (21-30) and very high (31-40) based on the impact level of disease on health status. A CAT score over 10 suggests significant symptoms.  A worsening CAT score could be explained by an exacerbation, poor medication adherence, poor inhaler technique, or progression of COPD or comorbid conditions.  CAT MCID is 2 points  mMRC: mMRC (Modified Medical Research Council) Dyspnea Scale is used to assess the degree of baseline functional disability in patients of respiratory disease due to dyspnea. No minimal important difference is established. A decrease in score of 1 point or greater is considered a positive change.   Pulmonary Function Assessment:  Pulmonary Function Assessment - 08/22/24 1116       Breath   Bilateral Breath Sounds Decreased;Clear    Shortness of Breath Yes;Fear of Shortness of Breath;Limiting activity;Panic with Shortness of Breath          Exercise Target Goals: Exercise Program Goal: Individual exercise prescription set using results from initial 6 min walk test and THRR while considering  patient's activity barriers and safety.   Exercise Prescription Goal: Initial exercise prescription builds to 30-45 minutes a day of aerobic activity, 2-3 days per week.  Home exercise guidelines will be given to patient during program as part of exercise prescription that the participant will acknowledge.  Activity Barriers & Risk Stratification:  Activity Barriers & Cardiac Risk  Stratification - 08/22/24 1111       Activity Barriers & Cardiac Risk Stratification   Activity Barriers Deconditioning;Muscular Weakness;Shortness of Breath;Arthritis;History of Falls    Cardiac Risk Stratification Moderate          6 Minute Walk:  6 Minute Walk     Row Name 08/22/24 1214         6 Minute Walk   Phase Initial     Distance 421 feet     Walk Time 6 minutes     # of Rest Breaks 2  1:49-2:57; 4:24-5:20     MPH 0.8     METS 1.45     RPE 11     Perceived Dyspnea  1     VO2 Peak 5.09     Symptoms Yes (comment)     Comments right knee pain     Resting HR 82 bpm     Resting BP 134/88     Resting Oxygen Saturation  91 %     Exercise Oxygen Saturation  during 6 min walk 85 %     Max Ex. HR 121 bpm     Max Ex. BP 136/80     2 Minute Post BP 124/80       Interval HR   1 Minute HR 112     2 Minute HR 114     3 Minute HR 103     4 Minute HR 121     5 Minute HR 99     6 Minute HR 113     2 Minute Post HR 88     Interval Heart Rate? Yes       Interval Oxygen   Interval Oxygen?  Yes     Baseline Oxygen Saturation % 91 %     1 Minute Oxygen Saturation % 93 %     1 Minute Liters of Oxygen 6 L     2 Minute Oxygen Saturation % 85 %     2 Minute Liters of Oxygen 6 L  increased to 8L     3 Minute Oxygen Saturation % 93 %     3 Minute Liters of Oxygen 8 L     4 Minute Oxygen Saturation % 86 %     4 Minute Liters of Oxygen 8 L  increased to 10L     5 Minute Oxygen Saturation % 88 %     5 Minute Liters of Oxygen 10 L     6 Minute Oxygen Saturation % 92 %     6 Minute Liters of Oxygen 10 L     2 Minute Post Oxygen Saturation % 97 %     2 Minute Post Liters of Oxygen 10 L        Oxygen Initial Assessment:  Oxygen Initial Assessment - 08/22/24 1114       Home Oxygen   Home Oxygen Device Home Concentrator;E-Tanks    Sleep Oxygen Prescription Continuous;CPAP    Liters per minute 4    Home Exercise Oxygen Prescription Continuous    Liters per minute 6     Home Resting Oxygen Prescription Continuous    Liters per minute 4    Compliance with Home Oxygen Use Yes      Initial 6 min Walk   Oxygen Used Continuous    Liters per minute 10      Program Oxygen Prescription   Program Oxygen Prescription Continuous    Liters per minute 10      Intervention   Short Term Goals To learn and exhibit compliance with exercise, home and travel O2 prescription;To learn and understand importance of maintaining oxygen saturations>88%;To learn and demonstrate proper use of respiratory medications;To learn and understand importance of monitoring SPO2 with pulse oximeter and demonstrate accurate use of the pulse oximeter.;To learn and demonstrate proper pursed lip breathing techniques or other breathing techniques.     Long  Term Goals Exhibits compliance with exercise, home  and travel O2 prescription;Maintenance of O2 saturations>88%;Compliance with respiratory medication;Verbalizes importance of monitoring SPO2 with pulse oximeter and return demonstration;Exhibits proper breathing techniques, such as pursed lip breathing or other method taught during program session;Demonstrates proper use of MDI's          Oxygen Re-Evaluation:  Oxygen Re-Evaluation     Row Name 08/22/24 1114 09/19/24 1024           Program Oxygen Prescription   Program Oxygen Prescription -- Continuous      Liters per minute -- 15      Comments -- NRB        Home Oxygen   Home Oxygen Device -- Home Concentrator;E-Tanks      Sleep Oxygen Prescription -- Continuous;CPAP      Liters per minute -- 4      Home Exercise Oxygen Prescription -- Continuous      Liters per minute -- 6      Home Resting Oxygen Prescription -- Continuous      Liters per minute -- 4      Compliance with Home Oxygen Use -- Yes        Goals/Expected Outcomes   Short Term Goals -- To learn and exhibit compliance with exercise,  home and travel O2 prescription;To learn and understand importance of maintaining  oxygen saturations>88%;To learn and demonstrate proper use of respiratory medications;To learn and understand importance of monitoring SPO2 with pulse oximeter and demonstrate accurate use of the pulse oximeter.;To learn and demonstrate proper pursed lip breathing techniques or other breathing techniques.       Long  Term Goals -- Exhibits compliance with exercise, home  and travel O2 prescription;Maintenance of O2 saturations>88%;Compliance with respiratory medication;Verbalizes importance of monitoring SPO2 with pulse oximeter and return demonstration;Exhibits proper breathing techniques, such as pursed lip breathing or other method taught during program session;Demonstrates proper use of MDI's      Goals/Expected Outcomes Compliance and understanding of oxygen saturation monitoring and breathing techniques to decrease shortness of breath. Compliance and understanding of oxygen saturation monitoring and breathing techniques to decrease shortness of breath.         Oxygen Discharge (Final Oxygen Re-Evaluation):  Oxygen Re-Evaluation - 09/19/24 1024       Program Oxygen Prescription   Program Oxygen Prescription Continuous    Liters per minute 15    Comments NRB      Home Oxygen   Home Oxygen Device Home Concentrator;E-Tanks    Sleep Oxygen Prescription Continuous;CPAP    Liters per minute 4    Home Exercise Oxygen Prescription Continuous    Liters per minute 6    Home Resting Oxygen Prescription Continuous    Liters per minute 4    Compliance with Home Oxygen Use Yes      Goals/Expected Outcomes   Short Term Goals To learn and exhibit compliance with exercise, home and travel O2 prescription;To learn and understand importance of maintaining oxygen saturations>88%;To learn and demonstrate proper use of respiratory medications;To learn and understand importance of monitoring SPO2 with pulse oximeter and demonstrate accurate use of the pulse oximeter.;To learn and demonstrate proper pursed  lip breathing techniques or other breathing techniques.     Long  Term Goals Exhibits compliance with exercise, home  and travel O2 prescription;Maintenance of O2 saturations>88%;Compliance with respiratory medication;Verbalizes importance of monitoring SPO2 with pulse oximeter and return demonstration;Exhibits proper breathing techniques, such as pursed lip breathing or other method taught during program session;Demonstrates proper use of MDI's    Goals/Expected Outcomes Compliance and understanding of oxygen saturation monitoring and breathing techniques to decrease shortness of breath.          Initial Exercise Prescription:  Initial Exercise Prescription - 08/22/24 1100       Date of Initial Exercise RX and Referring Provider   Date 08/22/24    Referring Provider [name]    Expected Discharge Date 11/27/24      Oxygen   Oxygen Continuous    Liters 10    Maintain Oxygen Saturation 88% or higher      Recumbant Bike   Level 1    RPM 50    Watts 80    Minutes 15    METs 1.8      NuStep   Level 1    SPM 60    Minutes 15    METs 2      Prescription Details   Frequency (times per week) 2    Duration Progress to 30 minutes of continuous aerobic without signs/symptoms of physical distress      Intensity   THRR 40-80% of Max Heartrate 65-130    Ratings of Perceived Exertion 11-13    Perceived Dyspnea 0-4      Progression   Progression Continue  progressive overload as per policy without signs/symptoms or physical distress.      Resistance Training   Training Prescription Yes    Weight red bands    Reps 10-15          Perform Capillary Blood Glucose checks as needed.  Exercise Prescription Changes:   Exercise Prescription Changes     Row Name 08/26/24 1200 09/09/24 1200 09/23/24 1200         Response to Exercise   Blood Pressure (Admit) 110/70 124/68 130/62     Blood Pressure (Exercise) 142/70 128/72 122/66     Blood Pressure (Exit) 120/80 120/70 112/72      Heart Rate (Admit) 89 bpm 85 bpm 106 bpm     Heart Rate (Exercise) 109 bpm 107 bpm 109 bpm     Heart Rate (Exit) 85 bpm 83 bpm 98 bpm     Oxygen Saturation (Admit) 92 % 87 % 93 %     Oxygen Saturation (Exercise) 93 % 91 % 95 %     Oxygen Saturation (Exit) 97 % 94 % 94 %     Rating of Perceived Exertion (Exercise) 13 15 13      Perceived Dyspnea (Exercise) 1 2 2      Duration Continue with 30 min of aerobic exercise without signs/symptoms of physical distress. Continue with 30 min of aerobic exercise without signs/symptoms of physical distress. Continue with 30 min of aerobic exercise without signs/symptoms of physical distress.     Intensity THRR unchanged THRR unchanged THRR unchanged       Progression   Progression Continue to progress workloads to maintain intensity without signs/symptoms of physical distress. Continue to progress workloads to maintain intensity without signs/symptoms of physical distress. Continue to progress workloads to maintain intensity without signs/symptoms of physical distress.       Resistance Training   Training Prescription Yes Yes Yes     Weight red bands red bands red bands     Reps 10-15 10-15 10-15     Time 10 Minutes 10 Minutes 10 Minutes       Oxygen   Oxygen Continuous Continuous Continuous     Liters 10 15 10        Recumbant Bike   Level 1 1 1      RPM 50 -- 46     Watts 12 -- 10     Minutes 15 15 15      METs 1.9 1.9 1.8       NuStep   Level 1 1 1      SPM 76 -- 74     Minutes 15 -- 15     METs 1.9 1.7 1.6       Oxygen   Maintain Oxygen Saturation 88% or higher 88% or higher 88% or higher        Exercise Comments:   Exercise Goals and Review:   Exercise Goals     Row Name 08/22/24 1041             Exercise Goals   Increase Physical Activity Yes       Intervention Provide advice, education, support and counseling about physical activity/exercise needs.;Develop an individualized exercise prescription for aerobic and  resistive training based on initial evaluation findings, risk stratification, comorbidities and participant's personal goals.       Expected Outcomes Short Term: Attend rehab on a regular basis to increase amount of physical activity.;Long Term: Exercising regularly at least 3-5 days a week.;Long Term: Add in home exercise  to make exercise part of routine and to increase amount of physical activity.       Increase Strength and Stamina Yes       Intervention Provide advice, education, support and counseling about physical activity/exercise needs.;Develop an individualized exercise prescription for aerobic and resistive training based on initial evaluation findings, risk stratification, comorbidities and participant's personal goals.       Expected Outcomes Short Term: Increase workloads from initial exercise prescription for resistance, speed, and METs.;Short Term: Perform resistance training exercises routinely during rehab and add in resistance training at home;Long Term: Improve cardiorespiratory fitness, muscular endurance and strength as measured by increased METs and functional capacity ( )       Able to understand and use rate of perceived exertion (RPE) scale Yes       Intervention Provide education and explanation on how to use RPE scale       Expected Outcomes Short Term: Able to use RPE daily in rehab to express subjective intensity level;Long Term:  Able to use RPE to guide intensity level when exercising independently       Able to understand and use Dyspnea scale Yes       Intervention Provide education and explanation on how to use Dyspnea scale       Expected Outcomes Short Term: Able to use Dyspnea scale daily in rehab to express subjective sense of shortness of breath during exertion;Long Term: Able to use Dyspnea scale to guide intensity level when exercising independently       Knowledge and understanding of Target Heart Rate Range (THRR) Yes       Intervention Provide education and  explanation of THRR including how the numbers were predicted and where they are located for reference       Expected Outcomes Short Term: Able to state/look up THRR;Long Term: Able to use THRR to govern intensity when exercising independently;Short Term: Able to use daily as guideline for intensity in rehab       Understanding of Exercise Prescription Yes       Intervention Provide education, explanation, and written materials on patient's individual exercise prescription       Expected Outcomes Short Term: Able to explain program exercise prescription;Long Term: Able to explain home exercise prescription to exercise independently          Exercise Goals Re-Evaluation :  Exercise Goals Re-Evaluation     Row Name 08/22/24 1327 09/19/24 1022           Exercise Goal Re-Evaluation   Exercise Goals Review Increase Physical Activity;Able to understand and use Dyspnea scale;Understanding of Exercise Prescription;Increase Strength and Stamina;Knowledge and understanding of Target Heart Rate Range (THRR);Able to understand and use rate of perceived exertion (RPE) scale Increase Physical Activity;Able to understand and use Dyspnea scale;Understanding of Exercise Prescription;Increase Strength and Stamina;Knowledge and understanding of Target Heart Rate Range (THRR);Able to understand and use rate of perceived exertion (RPE) scale      Comments [name] is scheduled to begin exercise on 10/30. Will continue to monitor and progress as able. [name] has completed 5 exercise sessions. She exercises for 15 min on the track and Nsutep. [name] averages 1.92 METs on the track and 1.6 METs at level 1 on the Nustep. She performs the warmup and cooldown standing holding onto a rollator for balance. She is currently hospitalized as I am unable to assess progress at this time. Will continue to monitor and progress as able.      Expected Outcomes Through  exercise at rehab and home, the patient will decrease shortness of breath with  daily activities and feel confident in carrying out an exercise regimen at home. Through exercise at rehab and home, the patient will decrease shortness of breath with daily activities and feel confident in carrying out an exercise regimen at home.         Discharge Exercise Prescription (Final Exercise Prescription Changes):  Exercise Prescription Changes - 09/23/24 1200       Response to Exercise   Blood Pressure (Admit) 130/62    Blood Pressure (Exercise) 122/66    Blood Pressure (Exit) 112/72    Heart Rate (Admit) 106 bpm    Heart Rate (Exercise) 109 bpm    Heart Rate (Exit) 98 bpm    Oxygen Saturation (Admit) 93 %    Oxygen Saturation (Exercise) 95 %    Oxygen Saturation (Exit) 94 %    Rating of Perceived Exertion (Exercise) 13    Perceived Dyspnea (Exercise) 2    Duration Continue with 30 min of aerobic exercise without signs/symptoms of physical distress.    Intensity THRR unchanged      Progression   Progression Continue to progress workloads to maintain intensity without signs/symptoms of physical distress.      Resistance Training   Training Prescription Yes    Weight red bands    Reps 10-15    Time 10 Minutes      Oxygen   Oxygen Continuous    Liters 10      Recumbant Bike   Level 1    RPM 46    Watts 10    Minutes 15    METs 1.8      NuStep   Level 1    SPM 74    Minutes 15    METs 1.6      Oxygen   Maintain Oxygen Saturation 88% or higher          Nutrition:  Target Goals: Understanding of nutrition guidelines, daily intake of sodium 1500mg , cholesterol 200mg , calories 30% from fat and 7% or less from saturated fats, daily to have 5 or more servings of fruits and vegetables.  Biometrics:  Pre Biometrics - 08/22/24 1211       Pre Biometrics   Grip Strength 8 kg           Nutrition Therapy Plan and Nutrition Goals:  Nutrition Therapy & Goals - 09/09/24 1144       Nutrition Therapy   Diet Regular Diet      Personal Nutrition  Goals   Nutrition Goal Patient to identify strategies for weight loss with goal of 0.5-2 # per week of weight loss.    Personal Goal #2 Patient to improve diet quality by using the plate method as a guide for meal planning to include lean protein/plant protein, fruits, vegetables, whole grains, nonfat dairy as part of a well-balanced diet.    Comments Patient with chronic diastolic CHF with significant RV dysfunction, pulmonary hypertension, CKD. Pt reports desire to achieve goal wt of 200 lb by January 2026. Current BMI > 40 kg/m2. Notes improved appetite control since starting tirzepatide . Finding other outlets such as [employer] and crafts to help address emotional needs. Food insecurity noted, though pt reports support from aunt to help fill gap from food stamps. Pt working to make healthy food choices and increase daily activity.      Intervention Plan   Intervention Prescribe, educate and counsel regarding individualized specific  dietary modifications aiming towards targeted core components such as weight, hypertension, lipid management, diabetes, heart failure and other comorbidities.    Expected Outcomes Short Term Goal: Understand basic principles of dietary content, such as calories, fat, sodium, cholesterol and nutrients.;Long Term Goal: Adherence to prescribed nutrition plan.          Nutrition Assessments:  MEDIFICTS Score Key: >=70 Need to make dietary changes  40-70 Heart Healthy Diet <= 40 Therapeutic Level Cholesterol Diet   Picture Your Plate Scores: <59 Unhealthy dietary pattern with much room for improvement. 41-50 Dietary pattern unlikely to meet recommendations for good health and room for improvement. 51-60 More healthful dietary pattern, with some room for improvement.  >60 Healthy dietary pattern, although there may be some specific behaviors that could be improved.    Nutrition Goals Re-Evaluation:   Nutrition Goals Discharge (Final Nutrition Goals  Re-Evaluation):   Psychosocial: Target Goals: Acknowledge presence or absence of significant depression and/or stress, maximize coping skills, provide positive support system. Participant is able to verbalize types and ability to use techniques and skills needed for reducing stress and depression.  Initial Review & Psychosocial Screening:  Initial Psych Review & Screening - 08/22/24 1109       Initial Review   Current issues with Current Depression;Current Psychotropic Meds;Current Stress Concerns    Source of Stress Concerns Chronic Illness;Financial;Unable to participate in former interests or hobbies    Comments Holley is currently on a fixed income with her disability check. She also has car trouble and states the money she receives each month is not enough to fix it.      Family Dynamics   Good Support System? Yes    Comments Pt has support from her aunt and friends      Barriers   Psychosocial barriers to participate in program Psychosocial barriers identified (see note)      Screening Interventions   Interventions Encouraged to exercise;Provide feedback about the scores to participant;To provide support and resources with identified psychosocial needs    Expected Outcomes Short Term goal: Utilizing psychosocial counselor, staff and physician to assist with identification of specific Stressors or current issues interfering with healing process. Setting desired goal for each stressor or current issue identified.;Long Term Goal: Stressors or current issues are controlled or eliminated.;Long Term goal: The participant improves quality of Life and PHQ9 Scores as seen by post scores and/or verbalization of changes;Short Term goal: Identification and review with participant of any Quality of Life or Depression concerns found by scoring the questionnaire.          Quality of Life Scores:  Scores of 19 and below usually indicate a poorer quality of life in these areas.  A difference of   2-3 points is a clinically meaningful difference.  A difference of 2-3 points in the total score of the Quality of Life Index has been associated with significant improvement in overall quality of life, self-image, physical symptoms, and general health in studies assessing change in quality of life.  PHQ-9: Review Flowsheet       08/22/2024  Depression screen PHQ 2/9  Decreased Interest 1  Down, Depressed, Hopeless 1  PHQ - 2 Score 2  Altered sleeping 2  Tired, decreased energy 1  Change in appetite 1  Feeling bad or failure about yourself  2  Trouble concentrating 1  Moving slowly or fidgety/restless 0  Suicidal thoughts 0  PHQ-9 Score 9   Difficult doing work/chores Somewhat difficult    Details  Data saved with a previous flowsheet row definition        Interpretation of Total Score  Total Score Depression Severity:  1-4 = Minimal depression, 5-9 = Mild depression, 10-14 = Moderate depression, 15-19 = Moderately severe depression, 20-27 = Severe depression   Psychosocial Evaluation and Intervention:  Psychosocial Evaluation - 08/22/24 1205       Psychosocial Evaluation & Interventions   Interventions Stress management education;Relaxation education;Encouraged to exercise with the program and follow exercise prescription    Comments Initial PHQ-2/PHQ-9 score 2/9. [name] states she is dealing with depression and stress. Her health and financial situation are the main contributors to her mental health barriers. She is taking psychotropic meds and just finished up counseling. Staff will provide education and relaxation techniques to help [name] deal with her daily stressors.    Expected Outcomes For [name] to have less depression and less stress while participating in the program    Continue Psychosocial Services  Follow up required by staff          Psychosocial Re-Evaluation:  Psychosocial Re-Evaluation     Row Name 08/25/24 0814 09/15/24 1551 09/17/24 0947          Psychosocial Re-Evaluation   Current issues with Current Depression;Current Psychotropic Meds;Current Stress Concerns Current Depression;Current Psychotropic Meds;Current Stress Concerns Current Depression;Current Psychotropic Meds;Current Stress Concerns     Comments [name] is scheduled to start PR class on 08/26/24. No new barriers or concerns since orientation. -- Monthly psy/soc re-eval is as follows: [name] stills struggle with depression. She is currently in the hospital and has had to miss a week of class. Staff will provide [name] with education on ways to reduce stress. No needs at this time.     Expected Outcomes For [name] to have less depression and stress while participating in the program. -- For [name] to have less depression and stress while participating in the program.     Interventions Encouraged to attend Pulmonary Rehabilitation for the exercise;Stress management education;Relaxation education -- Encouraged to attend Pulmonary Rehabilitation for the exercise;Stress management education;Relaxation education     Continue Psychosocial Services  Follow up required by staff -- Follow up required by staff        Psychosocial Discharge (Final Psychosocial Re-Evaluation):  Psychosocial Re-Evaluation - 09/17/24 0947       Psychosocial Re-Evaluation   Current issues with Current Depression;Current Psychotropic Meds;Current Stress Concerns    Comments Monthly psy/soc re-eval is as follows: [name] stills struggle with depression. She is currently in the hospital and has had to miss a week of class. Staff will provide [name] with education on ways to reduce stress. No needs at this time.    Expected Outcomes For [name] to have less depression and stress while participating in the program.    Interventions Encouraged to attend Pulmonary Rehabilitation for the exercise;Stress management education;Relaxation education    Continue Psychosocial Services  Follow up required by staff          Education: Education  Goals: Education classes will be provided on a weekly basis, covering required topics. Participant will state understanding/return demonstration of topics presented.  Learning Barriers/Preferences:  Learning Barriers/Preferences - 08/22/24 1211       Learning Barriers/Preferences   Learning Barriers Sight   wears glasses   Learning Preferences None          Education Topics: Know Your Numbers Group instruction that is supported by a PowerPoint presentation. Instructor discusses importance of knowing and understanding resting, exercise, and  post-exercise oxygen saturation, heart rate, and blood pressure. Oxygen saturation, heart rate, blood pressure, rating of perceived exertion, and dyspnea are reviewed along with a normal range for these values.    Exercise for the Pulmonary Patient Group instruction that is supported by a PowerPoint presentation. Instructor discusses benefits of exercise, core components of exercise, frequency, duration, and intensity of an exercise routine, importance of utilizing pulse oximetry during exercise, safety while exercising, and options of places to exercise outside of rehab.    MET Level  Group instruction provided by PowerPoint, verbal discussion, and written material to support subject matter. Instructor reviews what METs are and how to increase METs.  Flowsheet Row PULMONARY REHAB OTHER RESPIRATORY from 09/04/2024 in [facility]  Date 09/04/24  Educator EP  Instruction Review Code 1- Verbalizes Understanding    Pulmonary Medications Verbally interactive group education provided by instructor with focus on inhaled medications and proper administration.   Anatomy and Physiology of the Respiratory System Group instruction provided by PowerPoint, verbal discussion, and written material to support subject matter. Instructor reviews respiratory cycle and anatomical components of the respiratory  system and their functions. Instructor also reviews differences in obstructive and restrictive respiratory diseases with examples of each.    Oxygen Safety Group instruction provided by PowerPoint, verbal discussion, and written material to support subject matter. There is an overview of "What is Oxygen" and "Why do we need it".  Instructor also reviews how to create a safe environment for oxygen use, the importance of using oxygen as prescribed, and the risks of noncompliance. There is a brief discussion on traveling with oxygen and resources the patient may utilize.   Oxygen Use Group instruction provided by PowerPoint, verbal discussion, and written material to discuss how supplemental oxygen is prescribed and different types of oxygen supply systems. Resources for more information are provided.    Breathing Techniques Group instruction that is supported by demonstration and informational handouts. Instructor discusses the benefits of pursed lip and diaphragmatic breathing and detailed demonstration on how to perform both.     Risk Factor Reduction Group instruction that is supported by a PowerPoint presentation. Instructor discusses the definition of a risk factor, different risk factors for pulmonary disease, and how the heart and lungs work together. Flowsheet Row PULMONARY REHAB OTHER RESPIRATORY from 08/28/2024 in [facility]  Date 08/28/24  Educator EP  Instruction Review Code 1- Verbalizes Understanding    Pulmonary Diseases Group instruction provided by PowerPoint, verbal discussion, and written material to support subject matter. Instructor gives an overview of the different type of pulmonary diseases. There is also a discussion on risk factors and symptoms as well as ways to manage the diseases. Flowsheet Row PULMONARY REHAB OTHER RESPIRATORY from 09/11/2024 in [facility] for Heart, Vascular, &  Lung Health  Date 09/11/24  Educator RT  Instruction Review Code 1- Verbalizes Understanding    Stress and Energy Conservation Group instruction provided by PowerPoint, verbal discussion, and written material to support subject matter. Instructor gives an overview of stress and the impact it can have on the body. Instructor also reviews ways to reduce stress. There is also a discussion on energy conservation and ways to conserve energy throughout the day.   Warning Signs and Symptoms Group instruction provided by PowerPoint, verbal discussion, and written material to support subject matter. Instructor reviews warning signs and symptoms of stroke, heart  attack, cold and flu. Instructor also reviews ways to prevent the spread of infection.   Other Education Group or individual verbal, written, or video instructions that support the educational goals of the pulmonary rehab program.    Knowledge Questionnaire Score:  Knowledge Questionnaire Score - 08/22/24 1053       Knowledge Questionnaire Score   Pre Score 17/18          Core Components/Risk Factors/Patient Goals at Admission:  Personal Goals and Risk Factors at Admission - 08/22/24 1117       Core Components/Risk Factors/Patient Goals on Admission   Improve shortness of breath with ADL's Yes    Intervention Provide education, individualized exercise plan and daily activity instruction to help decrease symptoms of SOB with activities of daily living.    Expected Outcomes Short Term: Improve cardiorespiratory fitness to achieve a reduction of symptoms when performing ADLs;Long Term: Be able to perform more ADLs without symptoms or delay the onset of symptoms    Stress Yes    Intervention Offer individual and/or small group education and counseling on adjustment to heart disease, stress management and health-related lifestyle change. Teach and support self-help strategies.;Refer participants experiencing significant psychosocial  distress to appropriate mental health specialists for further evaluation and treatment. When possible, include family members and significant others in education/counseling sessions.    Expected Outcomes Short Term: Participant demonstrates changes in health-related behavior, relaxation and other stress management skills, ability to obtain effective social support, and compliance with psychotropic medications if prescribed.;Long Term: Emotional wellbeing is indicated by absence of clinically significant psychosocial distress or social isolation.          Core Components/Risk Factors/Patient Goals Review:   Goals and Risk Factor Review     Row Name 08/25/24 0816 09/17/24 1013           Core Components/Risk Factors/Patient Goals Review   Personal Goals Review Improve shortness of breath with ADL's;Develop more efficient breathing techniques such as purse lipped breathing and diaphragmatic breathing and practicing self-pacing with activity.;Stress Improve shortness of breath with ADL's;Develop more efficient breathing techniques such as purse lipped breathing and diaphragmatic breathing and practicing self-pacing with activity.;Stress      Review [name] is scheduled to start the PR program on 08/26/24. Unable to asses her goals at this time. Monthly review of patient's Core Components/Risk Factors/Patient Goals are as follows: Goal progressing for improving shortness of breath. [name] is currently exercising on 15L- NRB mask to keep sats >90%. She is exercising on the [place] and walking the track. Goal progressing for developing more efficient breathing techniques such as purse lipped breathing and diaphragmatic breathing; and practicing self-pacing with activity. Goal progressing on reducing stress. We will continue to monitor her progress throughout the program.      Expected Outcomes To improve shortness of breath with ADL's, develop more efficient breathing techniques such as purse lipped breathing and  diaphragmatic breathing; and practicing self-pacing with activity and have less stress. To improve shortness of breath with ADL's, develop more efficient breathing techniques such as purse lipped breathing and diaphragmatic breathing; and practicing self-pacing with activity and have less stress.         Core Components/Risk Factors/Patient Goals at Discharge (Final Review):   Goals and Risk Factor Review - 09/17/24 1013       Core Components/Risk Factors/Patient Goals Review   Personal Goals Review Improve shortness of breath with ADL's;Develop more efficient breathing techniques such as purse lipped breathing and diaphragmatic breathing and practicing  self-pacing with activity.;Stress    Review Monthly review of patient's Core Components/Risk Factors/Patient Goals are as follows: Goal progressing for improving shortness of breath. [name] is currently exercising on 15L- NRB mask to keep sats >90%. She is exercising on the [place] and walking the track. Goal progressing for developing more efficient breathing techniques such as purse lipped breathing and diaphragmatic breathing; and practicing self-pacing with activity. Goal progressing on reducing stress. We will continue to monitor her progress throughout the program.    Expected Outcomes To improve shortness of breath with ADL's, develop more efficient breathing techniques such as purse lipped breathing and diaphragmatic breathing; and practicing self-pacing with activity and have less stress.          ITP Comments: Pt is making expected progress toward Pulmonary Rehab goals after completing 6 session(s). Recommend continued exercise, life style modification, education, and utilization of breathing techniques to increase stamina and strength, while also decreasing shortness of breath with exertion.  Dr. Slater Staff is Medical Director for Pulmonary Rehab at [facility].

## 2024-09-26 NOTE — Addendum Note (Signed)
 Addended by: TOBIE RAJ SAILOR on: 09/26/2024 01:55 PM   Modules accepted: Orders

## 2024-09-29 ENCOUNTER — Telehealth (HOSPITAL_COMMUNITY): Payer: Self-pay

## 2024-09-29 NOTE — Telephone Encounter (Signed)
 Patient c/o for 10:15 PR class, no reason given.

## 2024-09-30 ENCOUNTER — Encounter (HOSPITAL_COMMUNITY)

## 2024-10-01 ENCOUNTER — Ambulatory Visit: Admitting: Orthopedic Surgery

## 2024-10-02 ENCOUNTER — Telehealth (HOSPITAL_COMMUNITY): Payer: Self-pay | Admitting: Cardiology

## 2024-10-02 ENCOUNTER — Telehealth (HOSPITAL_COMMUNITY): Payer: Self-pay

## 2024-10-02 ENCOUNTER — Encounter (HOSPITAL_COMMUNITY): Admission: RE | Admit: 2024-10-02 | Discharge: 2024-10-02 | Attending: Pulmonary Disease

## 2024-10-02 VITALS — Wt 216.7 lb

## 2024-10-02 DIAGNOSIS — I2729 Other secondary pulmonary hypertension: Secondary | ICD-10-CM | POA: Diagnosis present

## 2024-10-02 NOTE — Telephone Encounter (Signed)
 Stop amlodipine  for now. Difficult to discern if need to hold diuretics without weights or timeline of recent weight loss.

## 2024-10-02 NOTE — Telephone Encounter (Signed)
 Called to confirm/remind patient of their appointment [facility] on 10/03/24 2:30.   Appointment:   [x] Confirmed  [] Left mess   [] No answer/No voice mail  [] VM Full/unable to leave message  [] Phone not in service  Patient reminded to bring all medications and/or complete list.  Confirmed patient has transportation. Gave directions, instructed to utilize valet parking.

## 2024-10-02 NOTE — Telephone Encounter (Signed)
 Patient called shortly after pulm rehab to report B/p readings.  Before therapy 108/80 After therapy 90/50  Patient reports she drove from therapy with no problems. -mild HA and slight woozy in the head  No other b/p readings to report Weight is down,currently on zepbound   Pt is concerned with low reading for the day. Currently scheduled for f/u 12/5 and with pending weather  Please advise

## 2024-10-02 NOTE — Telephone Encounter (Signed)
 Pt aware.

## 2024-10-02 NOTE — Progress Notes (Signed)
 Daily Session Note  Patient Details  Name: Dawn Fowler MRN: [id number] Date of Birth: 1966/09/26 Referring Provider:   Conrad Ports Pulmonary Rehab Walk Test from 08/22/2024 in [facility]  Referring Provider [name]    Encounter Date: 10/02/2024  Check In:  Session Check In - 10/02/24 1137       Check-In   Supervising physician immediately available to respond to emergencies [facility] MD immediately available    Physician(s) Rosaline Bane, NP    Location [facility]    Staff Present Ronal Levin, RN, BSN;Allycia Pitz, Neita Moats, MS, ACSM-CEP, Exercise Physiologist;Randi Midge HECKLE, ACSM-CEP, Exercise Physiologist;Carlette Bernett, RN, BSN    Virtual Visit No    Medication changes reported     No    Fall or balance concerns reported    No    Tobacco Cessation No Change    Warm-up and Cool-down Performed as group-led instruction    Resistance Training Performed Yes    VAD Patient? No    PAD/SET Patient? No      Pain Assessment   Currently in Pain? No/denies    Pain Score 0-No pain    Multiple Pain Sites No          Capillary Blood Glucose: No results found for this or any previous visit (from the past 24 hours).    Social History   Tobacco Use  Smoking Status Never  Smokeless Tobacco Never    Goals Met:  Proper associated with RPD/PD & O2 Sat Independence with exercise equipment Exercise tolerated well No report of concerns or symptoms today Strength training completed today  Goals Unmet:  Not Applicable  Comments: Service time is from 1022 to 1145.    Dr. Slater [name] is Medical Director for Pulmonary Rehab at [facility].

## 2024-10-03 ENCOUNTER — Ambulatory Visit (HOSPITAL_COMMUNITY)

## 2024-10-03 NOTE — Progress Notes (Incomplete)
 ID:  Taraya Steward, DOB 20-Jun-1966, MRN [id number]   Provider location: [facility] Type of Visit: Established patient   PCP:  Burney Darice CROME, MD  Cardiologist:  Dr. Rolan   HPI: Dawn Fowler is a 58 y.o. 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  She is on CPAP at home and 6L home oxygen (says she has been on oxygen for years.).  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  She additionally has significant HTN.  She had been on Opsumit  10 + tadalafil  20 mg daily but ran out of this prior to her 2/21 admission. She also has a history of prior DVT after long train ride, has had 2 negative V/Q scans.  She was hospitalized for COVID-19 at [facility] in 1/21.    She was seen by [facility] after discharge from COVID admission and was noted to be volume overloaded, Lasix  was changed to torsemide  but she was unable to get to pharmacy to pick up. Subsequently, she developed progressive wt gain, nearly 20 lb and increased dyspnea, prompting her to report back to the ED in 2/21, where she was found to be in acute on chronic diastolic HF and readmitted for IV diuresis and AHF consultation. Echo in 2/21 showed LVEF 55-60%, moderate RV dilation/moderate RV dysfunction. RHC 2/21 showed elevated left and right heart filling pressures with severe pulmonary hypertension. PFTs completed showing severe restriction and severely decreased DLCO. High resolution CT was concerning  for small vessel disease, no ILD, and concerning for PAH.  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 She was continued on IV diuretics and had good response, diuresing 32 lb. She was transitioned to torsemide  40 mg/20  mg. Opsumit  and tadalafil  restarted.   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  She is on Eliquis . RHC in 9/21 showed normal RA pressure and PCWP, moderate-severe pulmonary arterial hypertension.   Echo 5/22 showed EF 55% with mildly enlarged RV with mild RV dysfunction, mildly D-shaped septum, PASP 48 with normal IVC.   In 2/23, she had bilateral breast reduction surgery. She developed significant volume overload and her torsemide  was increased.  She moved to [place]  and had RHC there in 2/25; normal PCWP and RA pressure but PA 87/32 mean 54 with PVR 11 WU and PCWP 15, CI 1.8 (thermo). V/Q scan in 2/25 showed no sign of chronic PEs.   Follow up 2/25, recently moved back to [place] from [place] . Sotatercept initiated given ongoing significantly elevated PA pressures.   Echo 5/25 showed EF 55-60%, D-shaped septum with moderate RV dysfunction, PASP 49 mmHg.   Headaches with sotatercept, stopped it.   RHC 9/25: mildly elevated PCWP, normal RA pressure, moderate pulmonary arterial hypertension with PVR 5.2 WU, preserved CO. Riociguat  up-titrated.  Today she returns for HF follow up. Overall feeling fine. Had a couple low oxygen sats recently, down to 78% on 4L, improved when she turned her oxygen flow up to 6L. She is mildly SOB with ADLs and walking short distances on flat ground with her rolling walker. Wears 4-6 L oxygen continuously. She has had occasional dizziness this week, she is concerned  it is related to increase in her [place] . Denies palpitations, abnormal bleeding, CP, edema, or PND/Orthopnea. Appetite ok. Weight at home 218 pounds. Taking all medications. She wears CPAP.  ECG (personally reviewed): NSR 76  Labs (5/25): K 4.3, creatinine 2.04 Labs (6/25): K 4, creatinine 1.84 Labs (7/25): K 3.9, creatinine 1.82, BNP 98, hgb 16.4 Labs (9/25): K 3.9, creatinine 2.36  6 minute walk (9/21): 168 m 6 minute walk (10/21): 122 m 6 minute walk (3/22): 107 m 6  minute walk (2/25): 229 m 6 minute walk (5/25): 152 m, oxygen sats 90%-86% on 4 L 6 minute walk (7/25): 243 m 6 minutes walk today 08/13/24: 167 m, oxygen ranged 80-88% on 6L  PMH: 1. OHS/OSA: Uses home oxygen.  2. CKD stage IV 3. COVID-19 PNA in 12/20 4. Restrictive lung disease: PFTs (2/21) with severe restriction, severely decreased DLCO.  - High resolution CT chest (2/21): no interstitial lung disease, small vessel disease.  - Breast reduction surgery 2/23 5. Gout 6. HTN 7. DVT: Remote, after train ride.  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  8. Pulmonary hypertension: RHC (2/21) with mean RA 14, PA 89/33 mean 54, mean PCWP 34, CI 2.22, PVR 3.87 WU => mixed pulmonary arterial/pulmonary venous hypertension.  - V/Q scan (2/21): No evidence for chronic PE.  - Echo (2/21): EF 55-60%, moderately dilated RV with moderately decreased systolic function.  - HIV and rheumatologic serologies negative.  - RHC (9/21): mean RA 9, PA 68/23 mean 40, mean PCWP 11, PVR 5.6 WU, no step up in oxygen saturation so no evidence for left to right shunting.  - Echo (5/22): EF 55% with mildly enlarged RV with mild RV dysfunction, mildly D-shaped septum, PASP 48 with normal IVC - RHC (2/25): mean RA 7, PA 87/32 mean 54, mean PCWP 15, CI thermo 1.8, PVR 11 WU. - Echo (5/25):  EF 55-60%, D-shaped septum with moderate RV dysfunction, PASP 49 mmHg.  - RHC (9/25): RA 3, PA 64/24 (42), PCWP 17, CO/CI (Fick) 4.81/2.47, PVR 5.2 WU 9. Depression  Current Outpatient Medications  Medication Sig Dispense Refill   acetaminophen  (TYLENOL ) 500 MG tablet Take 1,000 mg by mouth every 6 (six) hours as needed for moderate pain or headache.     albuterol  (VENTOLIN  HFA) 108 (90 Base) MCG/ACT inhaler Inhale 2 puffs into the lungs every 6 (six) hours as needed for wheezing or shortness of breath. 8 g 5   allopurinol  (ZYLOPRIM ) 100 MG tablet TAKE 1 TABLET(100 MG) BY MOUTH DAILY 30 tablet 2   ALYQ  20 MG tablet  Take 20 mg by mouth daily.     amLODipine  (NORVASC ) 5 MG tablet Take 1 tablet (5 mg total) by mouth daily. 90 tablet 3   apixaban  (ELIQUIS ) 5 MG TABS tablet Take 1 tablet (5 mg total) by mouth 2 (two) times daily. 60 tablet 9   Cholecalciferol  (VITAMIN D3) 50 MCG (2000 UT) capsule Take 2,000 Units by mouth in the morning.     colchicine 0.6 MG tablet Take 0.6 mg by mouth daily as needed (gout flare).     diclofenac  Sodium (VOLTAREN ) 1 % GEL Apply 2 g topically 3 (three) times daily as needed (pain.).     empagliflozin  (JARDIANCE ) 10 MG TABS tablet Take 1 tablet (10 mg total) by mouth daily. 30 tablet 6   famotidine  (PEPCID ) 20 MG tablet Take 20 mg by mouth as needed for heartburn or indigestion.     fluticasone  furoate-vilanterol (BREO ELLIPTA )  200-25 MCG/ACT AEPB Inhale 1 puff into the lungs daily. 60 each 11   gabapentin  (NEURONTIN ) 100 MG capsule Take 100 mg by mouth daily as needed (Pain).     ipratropium-albuterol  (DUONEB) 0.5-2.5 (3) MG/3ML SOLN INHALE THE CONTENTS OF 1 VIAL VIA NEBULIZER EVERY 6 HOURS AS NEEDED FOR WHEEZING OR SHORTNESS OF BREATH 1080 mL 2   losartan  (COZAAR ) 25 MG tablet Take 1 tablet (25 mg total) by mouth daily. 90 tablet 3   macitentan  (OPSUMIT ) 10 MG tablet Take 1 tablet (10 mg total) by mouth daily. 90 tablet 3   OXYGEN Inhale 4-8 L into the lungs continuous.     pantoprazole  (PROTONIX ) 40 MG tablet Take 1 tablet (40 mg total) by mouth daily. 30 tablet 11   Riociguat  (ADEMPAS ) 2 MG TABS Take 1 mg by mouth in the morning, at noon, and at bedtime.     Selexipag  (UPTRAVI ) 800 MCG TABS Take 1 tablet (800 mcg total) by mouth 2 (two) times daily. 60 tablet 0   sertraline  (ZOLOFT ) 50 MG tablet Take 1 tablet (50 mg total) by mouth 2 (two) times daily. 60 tablet 0   spironolactone  (ALDACTONE ) 25 MG tablet TAKE 1/2 TABLET(12.5 MG) BY MOUTH DAILY 45 tablet 3   tirzepatide  (ZEPBOUND ) 10 MG/0.5ML Pen Inject 10 mg into the skin once a week. 2 mL 0   torsemide  (DEMADEX ) 20 MG  tablet Take 2 tablets (40 mg total) by mouth daily. 60 tablet 6   No current facility-administered medications for this visit.   Allergies  Allergen Reactions   Adhesive [Tape] Hives    Tolerates paper tape   Latex Dermatitis   Succinylcholine Other (See Comments)    Have trouble waking up   Tizanidine Hives   Social History   Socioeconomic History   Marital status: Single    Spouse name: Not on file   Number of children: 1   Years of education: Not on file   Highest education level: Master's degree (e.g., MA, MS, MEng, MEd, MSW, MBA)  Occupational History   Not on file  Tobacco Use   Smoking status: Never   Smokeless tobacco: Never  Vaping Use   Vaping status: Never Used  Substance and Sexual Activity   Alcohol use: Not Currently   Drug use: Not Currently   Sexual activity: Not Currently    Partners: Male    Birth control/protection: Post-menopausal    Comment: 1st intercourse- 77, partners- more than 5  Other Topics Concern   Not on file  Social History Narrative   Lives with aunt   Social Drivers of Health   Financial Resource Strain: High Risk (07/12/2022)   Overall Financial Resource Strain (CARDIA)    Difficulty of Paying Living Expenses: Very hard  Food Insecurity: No Food Insecurity (09/16/2024)   Hunger Vital Sign    Worried About Running Out of Food in the Last Year: Never true    Ran Out of Food in the Last Year: Never true  Transportation Needs: No Transportation Needs (09/16/2024)   PRAPARE - Administrator, Civil Service (Medical): No    Lack of Transportation (Non-Medical): No  Physical Activity: Not on file  Stress: Not on file  Social Connections: Moderately Isolated (09/16/2024)   Social Connection and Isolation Panel    Frequency of Communication with Friends and Family: Twice a week    Frequency of Social Gatherings with Friends and Family: Twice a week    Attends Religious Services: 1 to 4  times per year    Active Member of  Clubs or Organizations: No    Attends Banker Meetings: Never    Marital Status: Never married  Intimate Partner Violence: Not At Risk (09/16/2024)   Humiliation, Afraid, Rape, and Kick questionnaire    Fear of Current or Ex-Partner: No    Emotionally Abused: No    Physically Abused: No    Sexually Abused: No   Family History  Problem Relation Age of Onset   COPD Father    Cancer Mother        gallbladder and bladder    Hypertension Brother    Breast cancer Other    Diabetes Son    Breast cancer Cousin    Breast cancer Cousin    Pulmonary Hypertension Neg Hx    LMP  (LMP Unknown) Comment: pmb last 2-3 months  Wt Readings from Last 3 Encounters:  09/23/24 99.2 kg (218 lb 11.1 oz)  09/18/24 99.3 kg (218 lb 14.4 oz)  09/09/24 101.2 kg (223 lb 1.7 oz)   PHYSICAL EXAM:  General:  NAD. No resp difficulty, walked into clinic with rolling walker, on oxygen HEENT: Normal Neck: Supple. No JVD. Cor: Regular rate & rhythm. No rubs, gallops or murmurs. Lungs: Clear Abdomen: Soft, nontender, nondistended.  Extremities: No cyanosis, clubbing, rash, edema Neuro: Alert & oriented x 3, moves all 4 extremities w/o difficulty. Affect pleasant.  ASSESSMENT & PLAN: 1. Chronic diastolic CHF with significant RV dysfunction: Echo in 2/21 with LVEF 55-60%, moderate RV dilation/moderate RV dysfunction.  RHC 2/18 showed elevated left and right heart filling pressures with severe pulmonary hypertension.  Suspect mixed pulmonary venous/pulmonary arterial hypertension in setting of significant LV diastolic dysfunction (PVR 3.87 WU).  Repeat RHC in 9/21 with moderate-severe PAH, normal filling pressures, and PVR 5.6 WU.  No evidence for left=>right shunting.  Echo in 5/22 showed EF 55% with mildly enlarged RV with mild RV dysfunction, mildly D-shaped septum, PASP 48 with normal IVC.  Echo in 5/25 showed EF 55-60%, D-shaped septum with moderate RV dysfunction, PASP 49 mmHg.  RHC 9/25 showed  mildly elevated PCWP with normal RA pressures, preserved CO and moderate PAH with PVR 5.2 WU. NYHA class III symptoms, she is not volume overloaded - Continue torsemide  40 mg daily. BMET/BNP today.  - Continue spiro 12.5 mg daily. - Continue Farxiga  10 mg daily.  2. Pulmonary hypertension: Suspect that this is a mixed picture with group 2 and group 3 PH (related to diastolic LV dysfunction and OHS/OSA). However, concerned for component of group 1 PAH as well.  V/Q scan not suggestive of chronic PEs and high resolution CT chest in 2/21 was not suggestive of ILD. HIV and rheumatologic serologies negative.  RHC showed severe mixed pulmonary venous/pulmonary arterial hypertension, repeat RHC after diuresis in 9/21 showed PAH. PFTs completed in 2/21 showed severe restriction, severely decreased DLCO (?restriction due to body habitus).  Echo in 5/25 showed D-shaped septum with moderate RV dysfunction and PASP 49 mmHg.  V/Q scan in 2/25 again showed no evidence for chronic PE, and RHC in 2/25 showed normal RA pressure, severe pulmonary arterial hypertension with PVR 11 WU, and PCWP borderline at 15 with low CI.  She is on Opsumit , riociguat  and Uptravi  800 mcg bid.  She did not tolerate sotatercept due to headache.  Repeat RHC 9/25 showed moderate PAH with PVR 5.2 WU, preserved CO and mildly elevated PCWP of 17, with normal RA pressure. - Continue to maintain oxygen saturation  with home O2 and CPAP. Weight loss also would be helpful.  She has had a breast reduction surgery which may decrease lung restriction.  - reduced today, compared to previous. - RHC as above.  - Continue Opsumit  10 mg daily. - She is on riociguat  1.5 mg tid, continue to increase to goal 2.5 mg tid as tolerated. She would like to continue current dose for now as she is unsure if this is cause of her dizziness. - She is tolerating Uptravi  800 mcg bid now, she has not tolerated uptitration.  3. HTN: BP controlled.  - Continue current  losartan  and amlodipine .  4. CKD stage 3.  - Continue SGLT2-i.  - Followup with nephrology.  5. OHS/OSA: Now s/p breast reduction surgery which hopefully will help with OHS. Compliant w/ CPAP and home oxygen, no change. 6. 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  As she is at baseline sedentary and has had recurrent DVTs, would continue Eliquis  long-term.   7. Restrictive lung disease: Now s/p breast reduction surgery.  8. Obesity: There is no height or weight on file to calculate BMI.  - She is on tirzepatide  and tolerating current dose.  Follow up   Signed, COLLETTA MANUELITA SAILOR, PA-C  10/03/2024  [facility] [address] [facility] [place] [address] [phone] (office) [phone] (fax)

## 2024-10-07 ENCOUNTER — Telehealth (HOSPITAL_COMMUNITY): Payer: Self-pay

## 2024-10-07 ENCOUNTER — Encounter (HOSPITAL_COMMUNITY): Admission: RE | Admit: 2024-10-07

## 2024-10-07 NOTE — Telephone Encounter (Signed)
 Patient called and stated that she wanted to change class time due to weather and grands were on a 3 hr delay for school.  Ok'd by Augustin to change to 1:15 class for Pulmonary Rehab

## 2024-10-08 ENCOUNTER — Encounter (HOSPITAL_BASED_OUTPATIENT_CLINIC_OR_DEPARTMENT_OTHER): Payer: Self-pay | Admitting: Pulmonary Disease

## 2024-10-08 ENCOUNTER — Ambulatory Visit (INDEPENDENT_AMBULATORY_CARE_PROVIDER_SITE_OTHER): Admitting: Pulmonary Disease

## 2024-10-08 VITALS — BP 130/98 | HR 88 | Ht 61.0 in | Wt 216.0 lb

## 2024-10-08 DIAGNOSIS — J45909 Unspecified asthma, uncomplicated: Secondary | ICD-10-CM

## 2024-10-08 DIAGNOSIS — I5032 Chronic diastolic (congestive) heart failure: Secondary | ICD-10-CM | POA: Diagnosis not present

## 2024-10-08 DIAGNOSIS — G4733 Obstructive sleep apnea (adult) (pediatric): Secondary | ICD-10-CM

## 2024-10-08 DIAGNOSIS — I2723 Pulmonary hypertension due to lung diseases and hypoxia: Secondary | ICD-10-CM | POA: Diagnosis not present

## 2024-10-08 DIAGNOSIS — I2722 Pulmonary hypertension due to left heart disease: Secondary | ICD-10-CM

## 2024-10-08 DIAGNOSIS — R29898 Other symptoms and signs involving the musculoskeletal system: Secondary | ICD-10-CM

## 2024-10-08 DIAGNOSIS — J9611 Chronic respiratory failure with hypoxia: Secondary | ICD-10-CM

## 2024-10-08 DIAGNOSIS — Z86718 Personal history of other venous thrombosis and embolism: Secondary | ICD-10-CM | POA: Diagnosis not present

## 2024-10-08 DIAGNOSIS — I272 Pulmonary hypertension, unspecified: Secondary | ICD-10-CM

## 2024-10-08 NOTE — Progress Notes (Signed)
 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  Synopsis: Previously followed for chronic hypoxemic failure 2/2 PH and severe OSA at the [facility] in [place], [place]. Work-up included RHC 12/2016 RA 19 PA 76/40/48 PCWP 20 CO 4.02L/min CI 1.9, V/Q scan (neg), PFTs 06/2018 (mod restrictive defect), CT Chest (no evidence of ILD). Started on Opsumit  and tadalafil . Moved to [place] in 2020.  She was admitted for acute diastolic heart failure for IV diuretics. She underwent repeat work-up for her PH including RHC 12/2019 which demonstrated elevated right and left heart filling pressure with severe PH, V/Q scan (neg), PFT 12/2019 (severe restriction and reduced DLCO), HRCT (no evidence of ILD) and negative serologies.  She wears 4-8L at baseline. She wears a CPAP however sleep study 07/01/20 demonstrated nocturnal hypoxemia. CPAP has demonstrted bened  08/20/20 Since our last visit, she had a sleep study completed however did not sleep without interruption and felt the experience was overall poor.  She did not feel the sleep study was representative of what she did at home.  Otherwise she reports her dyspnea is at baseline and that she has been compliant with therapies including her home oxygen 4 L at rest and 8 L on exertion.  She continues to wear her CPAP which she finds comfortable and improves her dyspnea and quality of sleep.  When she does not wear her CPAP she notices a difference in her breathing. She has been participating PT and noticed her SpO2 to low 80s on 6L via [place]. She recovers within 2 minutes with rest with sats to upper 90s.  Cardiology has added uptravi  to her current regiment of Opsumit  and tadalfil. She has diarrhea that is controlled lomotil. She has been referred to weight loss clinic but not heard from it. She is considering a breast reduction.  Denies dizziness, chest pain, cough, wheezing or leg swelling.  No recent infections.  She also has questions about Covid and shingles vaccines.  08/02/21 Since our last visit, she has had back issues. She has been referred to Plastics to evaluate for breast reduction surgery as she feels this significantly affects her breathing. She has restarted Breo and is compliant with her oxygen 8L on exertion. Compliant with CPAP which provide clinically benefit with use including improved quality of sleep and energy. She is able to ambulate within the house but has difficulty with upper body activities that cause her to be winded. 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 She is compliant with her Uptravi  and Opsumit . Cardiology started her on Farxiga  and she reports now having a yeast infection that began 2-3 days ago with itching and odor.   07/15/24 She has not been seen in clinic since 08/04/22 with NP Cobb. She had briefly moved to [place]  and saw a pulmonologist there. She reports worsening shortness of breath with activity including household chores that require bending over are difficulty for her. Reports cough that is nonproductive throughout the day and worsens at night. Cough worsened by acid reflux. She is wearing oxygen  4-8L O2. She has a CPAP but is not established with DME company yet for supplies. Followed by Cardiology for [facility] and on torsemide , spiro and farxiga , opsumit , riociguat  and Uptravi . Not on sotatercept due to headaches. She is scheduled to see Cardiology this week.   07/28/24 Since our last visit she has had progressive shortness of breath. No wheezing or cough.  Worsens with exertion. She had RHC completed. She has a CPAP and needs more supplies. She had received a CPAP device in [place]  last year. Prior sleep test in [place] with nocturnal hypoxemia. Presents for PFT follow-up with family member.  10/08/24 Since our last visit she had admitted to [facility] long for respiratory failure 2/2 pulmonary edema in setting of recent up titration of Adempas . Discharged on home 4L O2 and Adempas  was resumed at prior dosing and restarted on diuretics. Hospital course reviewed. Sleep study scheduled for 10/15/24. Currently shortness of breath stable with exertion when wearing oxygen. Wanting to discuss her power chair options today.  Social history: Social stressors with her son and daughter-in-law has been contributing to her depression Friend planning to move in for support  Past Medical History:  Diagnosis Date   Acute respiratory failure due to COVID-19 (HCC) 10/30/2019   Anxiety    Arthritis    CHF (congestive heart failure) (HCC)    CKD (chronic kidney disease), stage III (HCC)    Complication of anesthesia    hard to wake up after general anesthesia - had trouble waking up after succinylcholine (consider possibility of pseudocholinesterase deficiency)   COVID-19 virus detected 11/20/2019   Tested positive for Covid in December/2020 Required hospitalization in December/2020 through January/2021 Received Redemsivir   Depression    DVT (deep venous thrombosis) (HCC) 06/09/2020   RLE   Dyspnea    Family history of adverse reaction to anesthesia    hard to wake up after general anesthesia   GERD (gastroesophageal reflux disease)    HLD (hyperlipidemia)    HTN (hypertension)    Pre-diabetes    no meds   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; 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     albuterol  (VENTOLIN  HFA) 108 (90 Base) MCG/ACT inhaler Inhale 2 puffs into the lungs every 6 (six) hours as needed for wheezing or shortness of breath. 8 g 5   allopurinol  (ZYLOPRIM ) 100 MG tablet TAKE 1 TABLET(100 MG) BY MOUTH DAILY 30 tablet 2   ALYQ  20 MG tablet Take 20 mg by mouth daily.     amLODipine  (NORVASC ) 5 MG tablet Take 1 tablet (5 mg total) by mouth daily. 90 tablet 3   apixaban  (ELIQUIS ) 5 MG TABS tablet Take 1 tablet (5 mg total) by mouth 2 (two) times daily. 60 tablet 9   Cholecalciferol  (VITAMIN D3) 50 MCG (2000 UT) capsule Take 2,000 Units by mouth in the morning.     colchicine 0.6 MG tablet Take 0.6 mg by mouth daily as needed (gout flare).     diclofenac  Sodium (VOLTAREN ) 1 % GEL Apply 2 g topically 3 (three) times daily as needed (pain.).  empagliflozin  (JARDIANCE ) 10 MG TABS tablet Take 1 tablet (10 mg total) by mouth daily. 30 tablet 6   famotidine  (PEPCID ) 20 MG tablet Take 20 mg by mouth as needed for heartburn or indigestion.     fluticasone  furoate-vilanterol (BREO ELLIPTA ) 200-25 MCG/ACT AEPB Inhale 1 puff into the lungs daily. 60 each 11   gabapentin  (NEURONTIN ) 100 MG capsule Take 100 mg by mouth daily as needed (Pain).     ipratropium-albuterol  (DUONEB) 0.5-2.5 (3) MG/3ML SOLN INHALE THE CONTENTS OF 1 VIAL VIA NEBULIZER EVERY 6 HOURS AS NEEDED FOR WHEEZING OR SHORTNESS OF BREATH 1080 mL 2   losartan  (COZAAR ) 25 MG tablet Take 1 tablet (25 mg total) by mouth daily. 90 tablet 3   macitentan  (OPSUMIT ) 10 MG tablet Take 1 tablet (10 mg total) by mouth daily. 90 tablet 3   OXYGEN Inhale 4-8 L into the lungs continuous.     pantoprazole  (PROTONIX ) 40 MG tablet Take 1 tablet (40 mg total) by mouth daily. 30 tablet 11   Riociguat  (ADEMPAS ) 2 MG TABS Take 1 mg  by mouth in the morning, at noon, and at bedtime.     Selexipag  (UPTRAVI ) 800 MCG TABS Take 1 tablet (800 mcg total) by mouth 2 (two) times daily. 60 tablet 0   sertraline  (ZOLOFT ) 50 MG tablet Take 1 tablet (50 mg total) by mouth 2 (two) times daily. 60 tablet 0   spironolactone  (ALDACTONE ) 25 MG tablet TAKE 1/2 TABLET(12.5 MG) BY MOUTH DAILY 45 tablet 3   tirzepatide  (ZEPBOUND ) 10 MG/0.5ML Pen Inject 10 mg into the skin once a week. 2 mL 0   torsemide  (DEMADEX ) 20 MG tablet Take 2 tablets (40 mg total) by mouth daily. 60 tablet 6   No facility-administered medications prior to visit.    Review of Systems  Constitutional:  Negative for chills, diaphoresis, fever, malaise/fatigue and weight loss.  HENT:  Negative for congestion.   Respiratory:  Positive for shortness of breath. Negative for cough, hemoptysis, sputum production and wheezing.   Cardiovascular:  Negative for chest pain, palpitations and leg swelling.     Objective:   Vitals:   10/08/24 1014  BP: (!) 130/98  Pulse: 88  SpO2: (!) 89%  Weight: 216 lb (98 kg)  Height: 5' 1 (1.549 m)  Body mass index is 40.81 kg/m.  SpO2: (!) 89 % (on ox at rest)  Physical Exam: General: Well-appearing, no acute distress HENT: [place], AT Eyes: EOMI, no scleral icterus Respiratory: Clear to auscultation bilaterally.  No crackles, wheezing or rales Cardiovascular: RRR, -M/R/G, no JVD Extremities:-Edema,-tenderness Neuro: AAO x4, CNII-XII grossly intact Psych: Normal mood, normal affect  Data Reviewed:  Imaging:  12/25/2014-VQ scan-mild obstructive airways disease, very low probability of PE  12/18/2014-CTA anterior-no evidence of PE, cardiomegaly, dependent airspace trapping which may be related to morbid obesity or small airways disease ^Unable to view images but can read radiology report  07/26/18 CT Chest WO-(report only) enlarged MPA and centroal pulmonary arteries consistent with hx of pulmonary hypertension.  Stable  predominantly basilar mosaic attenuation reflects either small vessel or small airways disease.  Persistent predominant subcarinal, paraesophageal and bilateral infrahilar lymph node enlargement unchanged from 4 months ago.  Stable small perifissural nodule likely representing intranodular lymph node.  Other concerning parenchymal lesions not seen. -Report does not mention any interstitial lung disease.  05/11/2020- CXR cardiomegaly. No pulmonary edema or infiltrate  12/21/19 -VQ scan- no evidence of chronic PE  12/21/19 CT Chest - No parenchymal abnormalities. Mosaic attenuation.  Enlarged PAH  12/04/23 VQ scan -  1. Heterogeneous perfusion is nonspecific can be seen with pulmonary artery hypertension. There is also enlargement of pulmonary trunk and arteries suggesting pulmonary hypertension. No large segmental wedge-shaped defect suggest acute PE.   CXR 09/15/24 Cardiomegaly with vascular congestion  CXR 09/17/24 - Resolved pulmonary congestion/edema  PFT: 02/11/2015-office spirometry-FVC 2.14 L, FEV1 1.78, ratio 83  12/22/19  FVC 1.43 (48%) FEV1 1.27 (54%) Ratio 87  TLC 64% DLCO 32% Interpretation: No evidence of obstructive defect however significant response to bronchodilators suggestive of asthma. Co-comitant severe restrictive lung defect with reduced gas exchange also present.  07/28/24 FVC 1.94 (55%) FEV1 1.65 (60%) Ratio 85  TLC 103% DLCO 36% Interpretation: No obstructive defect present. Reduced FVC and FEV1 with normal TLC is nonspecific. Severely reduced gas exchange. Compared to PFTs in 2021, restrictive defect has normalized. DLCO remains severely low.   Echo 12/21/2014 LV ejection fraction 60 to 65%, moderate pulmonary hypertension, moderate diastolic dysfunction  12/21/19  LV EF 55-60%, moderately dilated RV with decreased systolic function    12/18/19 RHC Procedural Findings: Hemodynamics (mmHg) RA mean 14 RV 87/21 PA 89/33, mean 54 PCWP mean 34 Oxygen saturations: SVC  63% PA 61% AO 94% Cardiac Output (Fick) 5.17  Cardiac Index (Fick) 2.22 PVR 3.87 WU  12/20/23 RHC @ [facility]  Pulmonary Capillary Wedge (PCW) Mean 15 2-12  Pulmonary Artery (PA) 87/32 15-30/6-12  Pulmonary artery mean 54 10-20  Right Ventricle (RV) 85/11 15-30/0-6  Right Atrium (RA) Mean 7 1-5   CONCLUSIONS:  1.  Elevated right ventricular pressure.  2.  Severe pulmonary hypertension with a borderline elevated  pulmonary capillary wedge pressure.  3.  Depressed cardiac index by thermodilution.   07/18/24 RHC @ [facility] Moderate pAH  Assessment & Plan:   Discussion: 58 year old female never smoker with history of of OSA, chronic hypoxemic respiratory failure, severe pulmonary hypertension secondary WHO group 2/3, chronic diastolic heart failure, history recurrent DVT on Eliquis , CKD stage III, BMI 41 who presents for follow-up.  Previously lost to follow-up since 2023 and reestablish care 06/2024.  Has had worsening shortness of breath that is likely multifactorial with mixed PAH, morbid obesity, asthma and history of OSA.  Suspect recurrence of OSA and sleep study pending to determine noninvasive ventilation needs.  For asthma on high-dose ICS/LABA though I suspect her chronic comorbidities are driving her dyspnea including severe deconditioning.  Has not been able to participate in pulmonary rehab due to recent hospitalization.  From a medical standpoint she would benefit from having a mobilized scooter.  Severe Pulmonary Hypertension Group II, III -Unable to rule out group I (neg autoimmune serologies). V/Q neg chronic PE. CT chest neg for ILD. Not a transplant candidate due to BMI Severely reduced DLCO 2/2 above NYHA class III symptoms, unchanged Chronic diastolic heart failure, RV dysfunction --CONTINUE supplemental O2 for goal >90% --Continue PH and diuretic medication per Cardiology.  --Due to difficulty with mobilization due patient requesting mobilized scooter.   >Currently in process for  mobility scooter. [facility] present to discuss options --Pulmonary rehab cancelled due to recent hospitalization --Goal for 5000 steps a day  Chronic hypoxemic respiratory failure 2/2 PH, possible OSA --Continue 4-5L continuous O2 with activity and sleep --Currently wears CPAP nightly --Goal SpO2 >90%   History of severe OSA  OSA on CPAP  --07/01/20 Split night study - Nocturnal hypoxemia --Patient continues to use prior CPAP device with self-reported benefit --Counseled on sleep hygiene --Counseled on weight loss/maintenance  of healthy weight --Counseled NOT to drive if/when sleepy --Sleep study pending 10/15/24  Moderately severe 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 Will re-evaluate with PFTs to determine if restrictive defect still present --CONTINUE Breo 200 ONE puff daily --Continue nebs PRN --Continue weight loss efforts with PCP and Zepbound  --Consider repeat CT chest for surveillance in setting of reduced DLCO to r/o parenchymal involvement  Recurrent DVT Hx of RLE DVT in 05/2020 and 2017 --Continue Eliquis    Health Maintenance Immunization History  Administered Date(s) Administered   Fluad Quad(high Dose 65+) 09/20/2020   Influenza Whole 08/20/2019   Influenza,inj,Quad PF,6+ Mos 06/25/2018   Moderna Covid-19 Fall Seasonal Vaccine 77yrs & older 08/16/2023   PFIZER(Purple Top)SARS-COV-2 Vaccination 02/02/2020, 02/24/2020, 09/27/2020   Pneumococcal Polysaccharide-23 07/17/2014   CT Lung Screen -never smoker, would not qualify  I have spent a total time of 35-minutes on the day of the appointment including chart review, data review, collecting history, coordinating care and discussing medical diagnosis and plan with the patient/family. Past medical history, allergies, medications were reviewed. Pertinent imaging, labs and tests included in this note have  been reviewed and interpreted independently by me.  Jayvyn Haselton Slater Staff, MD [facility] 10/08/2024 10:22 AM

## 2024-10-08 NOTE — Patient Instructions (Addendum)
--  CONTINUE supplemental O2 for goal >90% --Continue PH and diuretic medication per Cardiology.  --Due to difficulty with mobilization due patient requesting mobilized scooter.  >Currently in process for  mobility scooter. [facility] present to discuss options  --Sleep study pending 10/15/24

## 2024-10-09 ENCOUNTER — Encounter (HOSPITAL_COMMUNITY): Admission: RE | Admit: 2024-10-09 | Discharge: 2024-10-09 | Attending: Pulmonary Disease

## 2024-10-09 DIAGNOSIS — I2729 Other secondary pulmonary hypertension: Secondary | ICD-10-CM

## 2024-10-09 NOTE — Progress Notes (Signed)
 Daily Session Note  Patient Details  Name: Dawn Fowler MRN: [id number] Date of Birth: [birth year] Referring Provider:   Conrad Ports Pulmonary Rehab Walk Test from 08/22/2024 in [facility]  Referring Provider [name]    Encounter Date: 10/09/2024  Check In:  Session Check In - 10/09/24 1037       Check-In   Supervising physician immediately available to respond to emergencies [facility] MD immediately available    Physician(s) Lum Louis, NP    Location [facility]    Staff Present Ronal Levin, RN, BSN;Casey Claudene, Neita Moats, MS, ACSM-CEP, Exercise Physiologist;Shakeitha Umbaugh Midge BS, ACSM-CEP, Exercise Physiologist    Virtual Visit No    Medication changes reported     No    Fall or balance concerns reported    No    Tobacco Cessation No Change    Warm-up and Cool-down Performed as group-led instruction    Resistance Training Performed Yes    VAD Patient? No    PAD/SET Patient? No      Pain Assessment   Currently in Pain? No/denies          Capillary Blood Glucose: No results found for this or any previous visit (from the past 24 hours).    Tobacco Use History[1]  Goals Met:  Proper associated with RPD/PD & O2 Sat Independence with exercise equipment Exercise tolerated well No report of concerns or symptoms today Strength training completed today  Goals Unmet:  Not Applicable  Comments: Service time is from 1012 to 1143.    Dr. Slater [name] is Medical Director for Pulmonary Rehab at [facility].     [1]  Social History Tobacco Use  Smoking Status Never  Smokeless Tobacco Never

## 2024-10-10 ENCOUNTER — Other Ambulatory Visit (HOSPITAL_COMMUNITY): Payer: Self-pay | Admitting: Cardiology

## 2024-10-14 ENCOUNTER — Encounter (HOSPITAL_COMMUNITY): Admission: RE | Admit: 2024-10-14 | Discharge: 2024-10-14 | Attending: Pulmonary Disease

## 2024-10-14 ENCOUNTER — Telehealth (HOSPITAL_COMMUNITY): Payer: Self-pay | Admitting: Cardiology

## 2024-10-14 DIAGNOSIS — I2729 Other secondary pulmonary hypertension: Secondary | ICD-10-CM | POA: Diagnosis not present

## 2024-10-14 NOTE — Telephone Encounter (Signed)
 Tirzepatide -Weight Management (Solution Auto-injector) Zepbound  10 MG/0.5ML Inject 10 mg into the skin once a week.  Pt request refill

## 2024-10-14 NOTE — Progress Notes (Signed)
 Daily Session Note  Patient Details  Name: Dawn Fowler MRN: [id number] Date of Birth: Sep 19, 1966 Referring Provider:   Conrad Ports Pulmonary Rehab Walk Test from 08/22/2024 in [facility]  Referring Provider [name]    Encounter Date: 10/14/2024  Check In:  Session Check In - 10/14/24 1109       Check-In   Supervising physician immediately available to respond to emergencies [facility] MD immediately available    Physician(s) Orren Fabry, NP    Location [facility]    Staff Present Ronal Levin, RN, BSN;Neftali Abair Claudene, Neita Moats, MS, ACSM-CEP, Exercise Physiologist;Carlette Bernett, RN, BSN    Virtual Visit No    Medication changes reported     No    Fall or balance concerns reported    No    Tobacco Cessation No Change    Warm-up and Cool-down Performed as group-led instruction    Resistance Training Performed Yes    VAD Patient? No    PAD/SET Patient? No      Pain Assessment   Currently in Pain? No/denies    Multiple Pain Sites No          Capillary Blood Glucose: No results found for this or any previous visit (from the past 24 hours).    Tobacco Use History[1]  Goals Met:  Proper associated with RPD/PD & O2 Sat Independence with exercise equipment Exercise tolerated well No report of concerns or symptoms today Strength training completed today  Goals Unmet:  Not Applicable  Comments: Service time is from 1020 to 1143.    Dr. Slater [name] is Medical Director for Pulmonary Rehab at [facility].     [1]  Social History Tobacco Use  Smoking Status Never  Smokeless Tobacco Never

## 2024-10-15 ENCOUNTER — Ambulatory Visit (HOSPITAL_BASED_OUTPATIENT_CLINIC_OR_DEPARTMENT_OTHER): Attending: Pulmonary Disease | Admitting: Cardiology

## 2024-10-15 VITALS — Ht 60.0 in | Wt 220.0 lb

## 2024-10-15 DIAGNOSIS — R0902 Hypoxemia: Secondary | ICD-10-CM | POA: Insufficient documentation

## 2024-10-15 DIAGNOSIS — G479 Sleep disorder, unspecified: Secondary | ICD-10-CM | POA: Insufficient documentation

## 2024-10-15 DIAGNOSIS — G4733 Obstructive sleep apnea (adult) (pediatric): Secondary | ICD-10-CM

## 2024-10-15 MED ORDER — ZEPBOUND 10 MG/0.5ML ~~LOC~~ SOAJ: 10.0000 mg | SUBCUTANEOUS | 0 refills | Status: DC

## 2024-10-16 ENCOUNTER — Encounter (HOSPITAL_COMMUNITY): Admission: RE | Admit: 2024-10-16 | Discharge: 2024-10-16 | Attending: Pulmonary Disease

## 2024-10-16 ENCOUNTER — Telehealth (HOSPITAL_COMMUNITY): Payer: Self-pay

## 2024-10-16 DIAGNOSIS — I2729 Other secondary pulmonary hypertension: Secondary | ICD-10-CM | POA: Diagnosis not present

## 2024-10-16 NOTE — Progress Notes (Signed)
 Daily Session Note  Patient Details  Name: Dawn Fowler MRN: [id number] Date of Birth: [birth year] Referring Provider:   Conrad Ports Pulmonary Rehab Walk Test from 08/22/2024 in [facility]  Referring Provider [name]    Encounter Date: 10/16/2024  Check In:  Session Check In - 10/16/24 1339       Check-In   Supervising physician immediately available to respond to emergencies [facility] MD immediately available    Physician(s) Barnie Hila, NP    Location [facility]    Staff Present Ronal Levin, RN, BSN;Dante Roudebush Claudene, Neita Moats, MS, ACSM-CEP, Exercise Physiologist;Randi Midge HECKLE, ACSM-CEP, Exercise Physiologist    Virtual Visit No    Medication changes reported     No    Fall or balance concerns reported    No    Tobacco Cessation No Change    Warm-up and Cool-down Performed as group-led instruction    Resistance Training Performed Yes    VAD Patient? No    PAD/SET Patient? No      Pain Assessment   Currently in Pain? No/denies    Pain Score 0-No pain    Multiple Pain Sites No          Capillary Blood Glucose: No results found for this or any previous visit (from the past 24 hours).    Tobacco Use History[1]  Goals Met:  Proper associated with RPD/PD & O2 Sat Independence with exercise equipment Exercise tolerated well No report of concerns or symptoms today Strength training completed today  Goals Unmet:  Not Applicable  Comments: Service time is from 1313 to 1455.    Dr. Slater [name] is Medical Director for Pulmonary Rehab at [facility].     [1]  Social History Tobacco Use  Smoking Status Never  Smokeless Tobacco Never

## 2024-10-16 NOTE — Telephone Encounter (Signed)
 Patient called and wanted to know if she could change her 10:15 Pulmonary session to 1:15 pm .  Ok'd by Pulmonary staff.

## 2024-10-21 ENCOUNTER — Encounter (HOSPITAL_COMMUNITY)
Admission: RE | Admit: 2024-10-21 | Discharge: 2024-10-21 | Disposition: A | Discharge status: Home / Self Care | Source: Ambulatory Visit | Attending: Pulmonary Disease | Admitting: Pulmonary Disease

## 2024-10-21 VITALS — Wt 217.8 lb

## 2024-10-21 DIAGNOSIS — I2729 Other secondary pulmonary hypertension: Secondary | ICD-10-CM | POA: Diagnosis not present

## 2024-10-21 NOTE — Progress Notes (Signed)
 Daily Session Note  Patient Details  Name: Dawn Fowler MRN: [id number] Date of Birth: 03-19-1966 Referring Provider:   Conrad Ports [facility] Walk Test from 08/22/2024 in [facility]  Referring Provider [name]    Encounter Date: 10/21/2024  Check In:  Session Check In - 10/21/24 1059       Check-In   Supervising physician immediately available to respond to emergencies [facility] MD immediately available    Physician(s) Barnie Hila, NP    Location [facility]    Staff Present Ronal Levin, RN, BSN;Genessa Beman Claudene, RT;Randi Reeve BS, ACSM-CEP, Exercise Physiologist    Virtual Visit No    Medication changes reported     No    Fall or balance concerns reported    No    Tobacco Cessation No Change    Warm-up and Cool-down Performed as group-led instruction    Resistance Training Performed Yes    VAD Patient? No    PAD/SET Patient? No      Pain Assessment   Currently in Pain? No/denies    Multiple Pain Sites No          Capillary Blood Glucose: No results found for this or any previous visit (from the past 24 hours).   Exercise Prescription Changes - 10/21/24 1100       Response to Exercise   Blood Pressure (Admit) 108/62    Blood Pressure (Exercise) 142/86    Blood Pressure (Exit) 98/54    Heart Rate (Admit) 78 bpm    Heart Rate (Exercise) 106 bpm    Heart Rate (Exit) 90 bpm    Oxygen Saturation (Admit) 90 %    Oxygen Saturation (Exercise) 97 %    Oxygen Saturation (Exit) 93 %    Rating of Perceived Exertion (Exercise) 13    Perceived Dyspnea (Exercise) 1    Duration Continue with 30 min of aerobic exercise without signs/symptoms of physical distress.    Intensity THRR unchanged      Progression   Progression Continue to progress workloads to maintain intensity without signs/symptoms of physical distress.      Resistance Training   Weight red bands    Reps 10-15    Time 10  Minutes      Oxygen   Oxygen Continuous    Liters 15 L/ NRB      Recumbant Bike   Level 2    RPM 53    Watts 14    Minutes 15    METs 1.9      NuStep   Level 2    SPM 70    Minutes 15    METs 1.7      Oxygen   Maintain Oxygen Saturation 88% or higher          Tobacco Use History[1]  Goals Met:  Proper associated with RPD/PD & O2 Sat Independence with exercise equipment Exercise tolerated well No report of concerns or symptoms today Strength training completed today  Goals Unmet:  Not Applicable  Comments: Service time is from 1011 to 1134.    Dr. Slater [name] is Medical Director for Pulmonary Rehab at [facility].     [1]  Social History Tobacco Use  Smoking Status Never  Smokeless Tobacco Never

## 2024-10-22 NOTE — Progress Notes (Signed)
 Pulmonary Individual Treatment Plan  Patient Details  Name: Dawn Fowler MRN: [id number] Date of Birth: 1966-02-13 Referring Provider:   Conrad Ports Pulmonary Rehab Walk Test from 08/22/2024 in [facility]  Referring Provider [name]    Initial Encounter Date:  Flowsheet Row Pulmonary Rehab Walk Test from 08/22/2024 in [facility]  Date 08/22/24    Visit Diagnosis: Other secondary pulmonary hypertension (HCC)  Patient's Home Medications on Admission:  Current Medications[1]  Past Medical History: Past Medical History:  Diagnosis Date   Acute respiratory failure due to COVID-19 [facility]) 10/30/2019   Anxiety    Arthritis    CHF (congestive heart failure) (HCC)    CKD (chronic kidney disease), stage III (HCC)    Complication of anesthesia    hard to wake up after general anesthesia - had trouble waking up after succinylcholine (consider possibility of pseudocholinesterase deficiency)   COVID-19 virus detected 11/20/2019   Tested positive for Covid in December/2020 Required hospitalization in December/2020 through January/2021 Received Redemsivir   Depression    DVT (deep venous thrombosis) (HCC) 06/09/2020   RLE   Dyspnea    Family history of adverse reaction to anesthesia    hard to wake up after general anesthesia   GERD (gastroesophageal reflux disease)    HLD (hyperlipidemia)    HTN (hypertension)    Pre-diabetes    no meds   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; 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, although unsure if she had confirmation testing done   Pulmonary HTN (HCC)    Sleep apnea    on CPAP   Trichomonas infection     Tobacco Use: Tobacco Use History[2]  Labs: Review Flowsheet  More data may exist      Latest Ref Rng & Units 10/29/2019 12/18/2019 07/12/2020 03/18/2024 07/18/2024   Labs for ITP Cardiac and Pulmonary Rehab  Trlycerides <150 mg/dL 862  - - - -  Hemoglobin A1c 4.8 - 5.6 % - - - 5.3  -  Bicarbonate 20.0 - 28.0 mmol/L 20.0 - 28.0 mmol/L - 23.8  24.8  26.9  25.6  26.4  25.6  28.2  26.7  25.1  26.4  - 21.8  22.4   TCO2 22 - 32 mmol/L 22 - 32 mmol/L - 25  26  28  27  28  27  30  28  26  28  24   - 23  24   Acid-base deficit 0.0 - 2.0 mmol/L 0.0 - 2.0 mmol/L - 3.0  2.0  1.0  - 4.0  4.0   O2 Saturation % % - 61.0  63.0  65.0  65.0  66.0  66.0  70.0  61.0  75.0  72.0  - 69  68     Details       Multiple values from one day are sorted in reverse-chronological order         Capillary Blood Glucose: Lab Results  Component Value Date   GLUCAP 83 09/18/2024   GLUCAP 97 09/17/2024   GLUCAP 94 09/16/2024   GLUCAP 86 07/18/2024   GLUCAP 140 (H) 12/21/2021     Pulmonary Assessment Scores:  Pulmonary Assessment Scores     Row Name 08/22/24 1220         ADL UCSD   ADL Phase Entry     SOB Score total 62  CAT Score   CAT Score 20       mMRC Score   mMRC Score 4       UCSD: Self-administered rating of dyspnea associated with activities of daily living (ADLs) 6-point scale (0 = not at all to 5 = maximal or unable to do because of breathlessness)  Scoring Scores range from 0 to 120.  Minimally important difference is 5 units  CAT: CAT can identify the health impairment of COPD patients and is better correlated with disease progression.  CAT has a scoring range of zero to 40. The CAT score is classified into four groups of low (less than 10), medium (10 - 20), high (21-30) and very high (31-40) based on the impact level of disease on health status. A CAT score over 10 suggests significant symptoms.  A worsening CAT score could be explained by an exacerbation, poor medication adherence, poor inhaler technique, or progression of COPD or comorbid conditions.  CAT MCID is 2 points  mMRC: mMRC (Modified Medical Research Council) Dyspnea  Scale is used to assess the degree of baseline functional disability in patients of respiratory disease due to dyspnea. No minimal important difference is established. A decrease in score of 1 point or greater is considered a positive change.   Pulmonary Function Assessment:  Pulmonary Function Assessment - 08/22/24 1116       Breath   Bilateral Breath Sounds Decreased;Clear    Shortness of Breath Yes;Fear of Shortness of Breath;Limiting activity;Panic with Shortness of Breath          Exercise Target Goals: Exercise Program Goal: Individual exercise prescription set using results from initial 6 min walk test and THRR while considering  patients activity barriers and safety.   Exercise Prescription Goal: Initial exercise prescription builds to 30-45 minutes a day of aerobic activity, 2-3 days per week.  Home exercise guidelines will be given to patient during program as part of exercise prescription that the participant will acknowledge.  Activity Barriers & Risk Stratification:  Activity Barriers & Cardiac Risk Stratification - 08/22/24 1111       Activity Barriers & Cardiac Risk Stratification   Activity Barriers Deconditioning;Muscular Weakness;Shortness of Breath;Arthritis;History of Falls    Cardiac Risk Stratification Moderate          6 Minute Walk:  6 Minute Walk     Row Name 08/22/24 1214         6 Minute Walk   Phase Initial     Distance 421 feet     Walk Time 6 minutes     # of Rest Breaks 2  1:49-2:57; 4:24-5:20     MPH 0.8     METS 1.45     RPE 11     Perceived Dyspnea  1     VO2 Peak 5.09     Symptoms Yes (comment)     Comments right knee pain     Resting HR 82 bpm     Resting BP 134/88     Resting Oxygen Saturation  91 %     Exercise Oxygen Saturation  during 6 min walk 85 %     Max Ex. HR 121 bpm     Max Ex. BP 136/80     2 Minute Post BP 124/80       Interval HR   1 Minute HR 112     2 Minute HR 114     3 Minute HR 103     4 Minute HR  121     5 Minute HR 99     6 Minute HR 113     2 Minute Post HR 88     Interval Heart Rate? Yes       Interval Oxygen   Interval Oxygen? Yes     Baseline Oxygen Saturation % 91 %     1 Minute Oxygen Saturation % 93 %     1 Minute Liters of Oxygen 6 L     2 Minute Oxygen Saturation % 85 %     2 Minute Liters of Oxygen 6 L  increased to 8L     3 Minute Oxygen Saturation % 93 %     3 Minute Liters of Oxygen 8 L     4 Minute Oxygen Saturation % 86 %     4 Minute Liters of Oxygen 8 L  increased to 10L     5 Minute Oxygen Saturation % 88 %     5 Minute Liters of Oxygen 10 L     6 Minute Oxygen Saturation % 92 %     6 Minute Liters of Oxygen 10 L     2 Minute Post Oxygen Saturation % 97 %     2 Minute Post Liters of Oxygen 10 L        Oxygen Initial Assessment:  Oxygen Initial Assessment - 08/22/24 1114       Home Oxygen   Home Oxygen Device Home Concentrator;E-Tanks    Sleep Oxygen Prescription Continuous;CPAP    Liters per minute 4    Home Exercise Oxygen Prescription Continuous    Liters per minute 6    Home Resting Oxygen Prescription Continuous    Liters per minute 4    Compliance with Home Oxygen Use Yes      Initial 6 min Walk   Oxygen Used Continuous    Liters per minute 10      Program Oxygen Prescription   Program Oxygen Prescription Continuous    Liters per minute 10      Intervention   Short Term Goals To learn and exhibit compliance with exercise, home and travel O2 prescription;To learn and understand importance of maintaining oxygen saturations>88%;To learn and demonstrate proper use of respiratory medications;To learn and understand importance of monitoring SPO2 with pulse oximeter and demonstrate accurate use of the pulse oximeter.;To learn and demonstrate proper pursed lip breathing techniques or other breathing techniques.     Long  Term Goals Exhibits compliance with exercise, home  and travel O2 prescription;Maintenance of O2 saturations>88%;Compliance  with respiratory medication;Verbalizes importance of monitoring SPO2 with pulse oximeter and return demonstration;Exhibits proper breathing techniques, such as pursed lip breathing or other method taught during program session;Demonstrates proper use of MDIs          Oxygen Re-Evaluation:  Oxygen Re-Evaluation     Row Name 08/22/24 1114 09/19/24 1024 10/10/24 0926         Program Oxygen Prescription   Program Oxygen Prescription -- Continuous Continuous     Liters per minute -- 15 15     Comments -- NRB NRB       Home Oxygen   Home Oxygen Device -- Home Concentrator;E-Tanks Home Concentrator;E-Tanks     Sleep Oxygen Prescription -- Continuous;CPAP Continuous;CPAP     Liters per minute -- 4 4     Home Exercise Oxygen Prescription -- Continuous Continuous     Liters per minute -- 6 6     Home Resting Oxygen  Prescription -- Continuous Continuous     Liters per minute -- 4 4     Compliance with Home Oxygen Use -- Yes Yes       Goals/Expected Outcomes   Short Term Goals -- To learn and exhibit compliance with exercise, home and travel O2 prescription;To learn and understand importance of maintaining oxygen saturations>88%;To learn and demonstrate proper use of respiratory medications;To learn and understand importance of monitoring SPO2 with pulse oximeter and demonstrate accurate use of the pulse oximeter.;To learn and demonstrate proper pursed lip breathing techniques or other breathing techniques.  To learn and exhibit compliance with exercise, home and travel O2 prescription;To learn and understand importance of maintaining oxygen saturations>88%;To learn and demonstrate proper use of respiratory medications;To learn and understand importance of monitoring SPO2 with pulse oximeter and demonstrate accurate use of the pulse oximeter.;To learn and demonstrate proper pursed lip breathing techniques or other breathing techniques.      Long  Term Goals -- Exhibits compliance with exercise,  home  and travel O2 prescription;Maintenance of O2 saturations>88%;Compliance with respiratory medication;Verbalizes importance of monitoring SPO2 with pulse oximeter and return demonstration;Exhibits proper breathing techniques, such as pursed lip breathing or other method taught during program session;Demonstrates proper use of MDIs Exhibits compliance with exercise, home  and travel O2 prescription;Maintenance of O2 saturations>88%;Compliance with respiratory medication;Verbalizes importance of monitoring SPO2 with pulse oximeter and return demonstration;Exhibits proper breathing techniques, such as pursed lip breathing or other method taught during program session;Demonstrates proper use of MDIs     Goals/Expected Outcomes Compliance and understanding of oxygen saturation monitoring and breathing techniques to decrease shortness of breath. Compliance and understanding of oxygen saturation monitoring and breathing techniques to decrease shortness of breath. Compliance and understanding of oxygen saturation monitoring and breathing techniques to decrease shortness of breath.        Oxygen Discharge (Final Oxygen Re-Evaluation):  Oxygen Re-Evaluation - 10/10/24 0926       Program Oxygen Prescription   Program Oxygen Prescription Continuous    Liters per minute 15    Comments NRB      Home Oxygen   Home Oxygen Device Home Concentrator;E-Tanks    Sleep Oxygen Prescription Continuous;CPAP    Liters per minute 4    Home Exercise Oxygen Prescription Continuous    Liters per minute 6    Home Resting Oxygen Prescription Continuous    Liters per minute 4    Compliance with Home Oxygen Use Yes      Goals/Expected Outcomes   Short Term Goals To learn and exhibit compliance with exercise, home and travel O2 prescription;To learn and understand importance of maintaining oxygen saturations>88%;To learn and demonstrate proper use of respiratory medications;To learn and understand importance of  monitoring SPO2 with pulse oximeter and demonstrate accurate use of the pulse oximeter.;To learn and demonstrate proper pursed lip breathing techniques or other breathing techniques.     Long  Term Goals Exhibits compliance with exercise, home  and travel O2 prescription;Maintenance of O2 saturations>88%;Compliance with respiratory medication;Verbalizes importance of monitoring SPO2 with pulse oximeter and return demonstration;Exhibits proper breathing techniques, such as pursed lip breathing or other method taught during program session;Demonstrates proper use of MDIs    Goals/Expected Outcomes Compliance and understanding of oxygen saturation monitoring and breathing techniques to decrease shortness of breath.          Initial Exercise Prescription:  Initial Exercise Prescription - 08/22/24 1100       Date of Initial Exercise RX and Referring Provider  Date 08/22/24    Referring Provider [name]    Expected Discharge Date 11/27/24      Oxygen   Oxygen Continuous    Liters 10    Maintain Oxygen Saturation 88% or higher      Recumbant Bike   Level 1    RPM 50    Watts 80    Minutes 15    METs 1.8      NuStep   Level 1    SPM 60    Minutes 15    METs 2      Prescription Details   Frequency (times per week) 2    Duration Progress to 30 minutes of continuous aerobic without signs/symptoms of physical distress      Intensity   THRR 40-80% of Max Heartrate 65-130    Ratings of Perceived Exertion 11-13    Perceived Dyspnea 0-4      Progression   Progression Continue progressive overload as per policy without signs/symptoms or physical distress.      Resistance Training   Training Prescription Yes    Weight red bands    Reps 10-15          Perform Capillary Blood Glucose checks as needed.  Exercise Prescription Changes:   Exercise Prescription Changes     Row Name 08/26/24 1200 09/09/24 1200 09/23/24 1200 10/02/24 1512 10/21/24 1100     Response to Exercise    Blood Pressure (Admit) 110/70 124/68 130/62 108/70 108/62   Blood Pressure (Exercise) 142/70 128/72 122/66 -- 142/86   Blood Pressure (Exit) 120/80 120/70 112/72 90/50 98/54    Heart Rate (Admit) 89 bpm 85 bpm 106 bpm 90 bpm 78 bpm   Heart Rate (Exercise) 109 bpm 107 bpm 109 bpm 114 bpm 106 bpm   Heart Rate (Exit) 85 bpm 83 bpm 98 bpm 88 bpm 90 bpm   Oxygen Saturation (Admit) 92 % 87 % 93 % 95 % 90 %   Oxygen Saturation (Exercise) 93 % 91 % 95 % 93 % 97 %   Oxygen Saturation (Exit) 97 % 94 % 94 % 96 % 93 %   Rating of Perceived Exertion (Exercise) 13 15 13 15 13    Perceived Dyspnea (Exercise) 1 2 2 2 1    Duration Continue with 30 min of aerobic exercise without signs/symptoms of physical distress. Continue with 30 min of aerobic exercise without signs/symptoms of physical distress. Continue with 30 min of aerobic exercise without signs/symptoms of physical distress. Continue with 30 min of aerobic exercise without signs/symptoms of physical distress. Continue with 30 min of aerobic exercise without signs/symptoms of physical distress.   Intensity THRR unchanged THRR unchanged THRR unchanged THRR unchanged THRR unchanged     Progression   Progression Continue to progress workloads to maintain intensity without signs/symptoms of physical distress. Continue to progress workloads to maintain intensity without signs/symptoms of physical distress. Continue to progress workloads to maintain intensity without signs/symptoms of physical distress. Continue to progress workloads to maintain intensity without signs/symptoms of physical distress. Continue to progress workloads to maintain intensity without signs/symptoms of physical distress.     Resistance Training   Training Prescription Yes Yes Yes Yes --   Weight red bands red bands red bands red bands red bands   Reps 10-15 10-15 10-15 10-15 10-15   Time 10 Minutes 10 Minutes 10 Minutes 10 Minutes 10 Minutes     Oxygen   Oxygen Continuous  Continuous Continuous -- Continuous   Liters 10 15 10  --  15 L/ NRB     Recumbant Bike   Level 1 1 1 1 2    RPM 50 -- 46 -- 53   Watts 12 -- 10 -- 14   Minutes 15 15 15 15 15    METs 1.9 1.9 1.8 1.9 1.9     NuStep   Level 1 1 1 1 2    SPM 76 -- 74 79 70   Minutes 15 -- 15 15 15    METs 1.9 1.7 1.6 1.7 1.7     Oxygen   Maintain Oxygen Saturation 88% or higher 88% or higher 88% or higher --  90 % or higher 88% or higher      Exercise Comments:   Exercise Goals and Review:   Exercise Goals     Row Name 08/22/24 1041             Exercise Goals   Increase Physical Activity Yes       Intervention Provide advice, education, support and counseling about physical activity/exercise needs.;Develop an individualized exercise prescription for aerobic and resistive training based on initial evaluation findings, risk stratification, comorbidities and participant's personal goals.       Expected Outcomes Short Term: Attend rehab on a regular basis to increase amount of physical activity.;Long Term: Exercising regularly at least 3-5 days a week.;Long Term: Add in home exercise to make exercise part of routine and to increase amount of physical activity.       Increase Strength and Stamina Yes       Intervention Provide advice, education, support and counseling about physical activity/exercise needs.;Develop an individualized exercise prescription for aerobic and resistive training based on initial evaluation findings, risk stratification, comorbidities and participant's personal goals.       Expected Outcomes Short Term: Increase workloads from initial exercise prescription for resistance, speed, and METs.;Short Term: Perform resistance training exercises routinely during rehab and add in resistance training at home;Long Term: Improve cardiorespiratory fitness, muscular endurance and strength as measured by increased METs and functional capacity ( )       Able to understand and use rate of  perceived exertion (RPE) scale Yes       Intervention Provide education and explanation on how to use RPE scale       Expected Outcomes Short Term: Able to use RPE daily in rehab to express subjective intensity level;Long Term:  Able to use RPE to guide intensity level when exercising independently       Able to understand and use Dyspnea scale Yes       Intervention Provide education and explanation on how to use Dyspnea scale       Expected Outcomes Short Term: Able to use Dyspnea scale daily in rehab to express subjective sense of shortness of breath during exertion;Long Term: Able to use Dyspnea scale to guide intensity level when exercising independently       Knowledge and understanding of Target Heart Rate Range (THRR) Yes       Intervention Provide education and explanation of THRR including how the numbers were predicted and where they are located for reference       Expected Outcomes Short Term: Able to state/look up THRR;Long Term: Able to use THRR to govern intensity when exercising independently;Short Term: Able to use daily as guideline for intensity in rehab       Understanding of Exercise Prescription Yes       Intervention Provide education, explanation, and written materials on patient's individual exercise prescription  Expected Outcomes Short Term: Able to explain program exercise prescription;Long Term: Able to explain home exercise prescription to exercise independently          Exercise Goals Re-Evaluation :  Exercise Goals Re-Evaluation     Row Name 08/22/24 1327 09/19/24 1022 10/10/24 0923         Exercise Goal Re-Evaluation   Exercise Goals Review Increase Physical Activity;Able to understand and use Dyspnea scale;Understanding of Exercise Prescription;Increase Strength and Stamina;Knowledge and understanding of Target Heart Rate Range (THRR);Able to understand and use rate of perceived exertion (RPE) scale Increase Physical Activity;Able to understand and use  Dyspnea scale;Understanding of Exercise Prescription;Increase Strength and Stamina;Knowledge and understanding of Target Heart Rate Range (THRR);Able to understand and use rate of perceived exertion (RPE) scale Increase Physical Activity;Able to understand and use Dyspnea scale;Understanding of Exercise Prescription;Increase Strength and Stamina;Knowledge and understanding of Target Heart Rate Range (THRR);Able to understand and use rate of perceived exertion (RPE) scale     Comments Dawn Fowler is scheduled to begin exercise on 10/30. Will continue to monitor and progress as able. Dawn Fowler has completed 5 exercise sessions. She exercises for 15 min on the track and Nsutep. Dawn Fowler averages 1.92 METs on the track and 1.6 METs at level 1 on the Nustep. She performs the warmup and cooldown standing holding onto a rollator for balance. She is currently hospitalized as I am unable to assess progress at this time. Will continue to monitor and progress as able. Dawn Fowler has completed 8 exercise sessions. She exercises for 15 min on the recumbent bike and Nustep. She averages 1.9 METs at level 2 on the recumbent bike and 1.5 METs at level 1 on the Nustep. She performs the warmup and cooldown standing/ seated dependent on her shortness of breath. She has transitioned from the track to the recumbent bike. This is due to an increase need in supplemental oxygen and patient preference. She tolerates the recumbent bike well. Dawn Fowler has been difficult to progress due to a recent hospitalization. Will continue to monitor and progress as able.     Expected Outcomes Through exercise at rehab and home, the patient will decrease shortness of breath with daily activities and feel confident in carrying out an exercise regimen at home. Through exercise at rehab and home, the patient will decrease shortness of breath with daily activities and feel confident in carrying out an exercise regimen at home. Through exercise at rehab and home, the patient will  decrease shortness of breath with daily activities and feel confident in carrying out an exercise regimen at home.        Discharge Exercise Prescription (Final Exercise Prescription Changes):  Exercise Prescription Changes - 10/21/24 1100       Response to Exercise   Blood Pressure (Admit) 108/62    Blood Pressure (Exercise) 142/86    Blood Pressure (Exit) 98/54    Heart Rate (Admit) 78 bpm    Heart Rate (Exercise) 106 bpm    Heart Rate (Exit) 90 bpm    Oxygen Saturation (Admit) 90 %    Oxygen Saturation (Exercise) 97 %    Oxygen Saturation (Exit) 93 %    Rating of Perceived Exertion (Exercise) 13    Perceived Dyspnea (Exercise) 1    Duration Continue with 30 min of aerobic exercise without signs/symptoms of physical distress.    Intensity THRR unchanged      Progression   Progression Continue to progress workloads to maintain intensity without signs/symptoms of physical distress.  Resistance Training   Weight red bands    Reps 10-15    Time 10 Minutes      Oxygen   Oxygen Continuous    Liters 15 L/ NRB      Recumbant Bike   Level 2    RPM 53    Watts 14    Minutes 15    METs 1.9      NuStep   Level 2    SPM 70    Minutes 15    METs 1.7      Oxygen   Maintain Oxygen Saturation 88% or higher          Nutrition:  Target Goals: Understanding of nutrition guidelines, daily intake of sodium 1500mg , cholesterol 200mg , calories 30% from fat and 7% or less from saturated fats, daily to have 5 or more servings of fruits and vegetables.  Biometrics:  Pre Biometrics - 08/22/24 1211       Pre Biometrics   Grip Strength 8 kg           Nutrition Therapy Plan and Nutrition Goals:  Nutrition Therapy & Goals - 09/09/24 1144       Nutrition Therapy   Diet Regular Diet      Personal Nutrition Goals   Nutrition Goal Patient to identify strategies for weight loss with goal of 0.5-2 # per week of weight loss.    Personal Goal #2 Patient to improve diet  quality by using the plate method as a guide for meal planning to include lean protein/plant protein, fruits, vegetables, whole grains, nonfat dairy as part of a well-balanced diet.    Comments Patient with chronic diastolic CHF with significant RV dysfunction, pulmonary hypertension, CKD. Pt reports desire to achieve goal wt of 200 lb by January 2026. Current BMI > 40 kg/m2. Notes improved appetite control since starting tirzepatide . Finding other outlets such as [employer] and crafts to help address emotional needs. Food insecurity noted, though pt reports support from aunt to help fill gap from food stamps. Pt working to make healthy food choices and increase daily activity.      Intervention Plan   Intervention Prescribe, educate and counsel regarding individualized specific dietary modifications aiming towards targeted core components such as weight, hypertension, lipid management, diabetes, heart failure and other comorbidities.    Expected Outcomes Short Term Goal: Understand basic principles of dietary content, such as calories, fat, sodium, cholesterol and nutrients.;Long Term Goal: Adherence to prescribed nutrition plan.          Nutrition Assessments:  MEDIFICTS Score Key: >=70 Need to make dietary changes  40-70 Heart Healthy Diet <= 40 Therapeutic Level Cholesterol Diet   Picture Your Plate Scores: <59 Unhealthy dietary pattern with much room for improvement. 41-50 Dietary pattern unlikely to meet recommendations for good health and room for improvement. 51-60 More healthful dietary pattern, with some room for improvement.  >60 Healthy dietary pattern, although there may be some specific behaviors that could be improved.    Nutrition Goals Re-Evaluation:  Nutrition Goals Re-Evaluation     Row Name 10/02/24 1137             Goals   Current Weight 216 lb 12.8 oz (98.3 kg)       Nutrition Goal Patient to identify strategies for weight loss with goal of 0.5-2 # per  week of weight loss.       Comment (09/18/2024) BUN 43, Cr 2.19, eGFR 26  Expected Outcome Goals in action. Patient with chronic diastolic CHF with significant RV dysfunction, pulmonary hypertension, CKD. Pt reports desire to achieve goal wt of 200 lb by January 2026. Weight trending down with 2 lb wt loss noted over past week; may be impacted by fluid status. Pt notes improved appetite control since starting tirzepatide ; however states intake may consist of 1-2 meals daily. Notes some difficulty reducing portion size based on reduced appetite and increased fullness. RD suggested using smaller plates. Also discussed importance of incorporating 2-3 small meals/snacks daily to maintain adequate caloric intake and protect muscle tissue. Pt reports efforts to limit sodium intake; however occasionally consumes higher sodium foods such as fast food, ramen noodles. Recognizes impact high sodium foods have on fluid retention. RD reviewed sources of high sodium foods and provided suggestions for lower sodium options. Encouraged pt to replace high sodium snacks such as chips with healthier options such as veggies.         Personal Goal #2 Re-Evaluation   Personal Goal #2 Patient to improve diet quality by using the plate method as a guide for meal planning to include lean protein/plant protein, fruits, vegetables, whole grains, nonfat dairy as part of a well-balanced diet.          Nutrition Goals Discharge (Final Nutrition Goals Re-Evaluation):  Nutrition Goals Re-Evaluation - 10/02/24 1137       Goals   Current Weight 216 lb 12.8 oz (98.3 kg)    Nutrition Goal Patient to identify strategies for weight loss with goal of 0.5-2 # per week of weight loss.    Comment (09/18/2024) BUN 43, Cr 2.19, eGFR 26    Expected Outcome Goals in action. Patient with chronic diastolic CHF with significant RV dysfunction, pulmonary hypertension, CKD. Pt reports desire to achieve goal wt of 200 lb by January 2026. Weight  trending down with 2 lb wt loss noted over past week; may be impacted by fluid status. Pt notes improved appetite control since starting tirzepatide ; however states intake may consist of 1-2 meals daily. Notes some difficulty reducing portion size based on reduced appetite and increased fullness. RD suggested using smaller plates. Also discussed importance of incorporating 2-3 small meals/snacks daily to maintain adequate caloric intake and protect muscle tissue. Pt reports efforts to limit sodium intake; however occasionally consumes higher sodium foods such as fast food, ramen noodles. Recognizes impact high sodium foods have on fluid retention. RD reviewed sources of high sodium foods and provided suggestions for lower sodium options. Encouraged pt to replace high sodium snacks such as chips with healthier options such as veggies.      Personal Goal #2 Re-Evaluation   Personal Goal #2 Patient to improve diet quality by using the plate method as a guide for meal planning to include lean protein/plant protein, fruits, vegetables, whole grains, nonfat dairy as part of a well-balanced diet.          Psychosocial: Target Goals: Acknowledge presence or absence of significant depression and/or stress, maximize coping skills, provide positive support system. Participant is able to verbalize types and ability to use techniques and skills needed for reducing stress and depression.  Initial Review & Psychosocial Screening:  Initial Psych Review & Screening - 08/22/24 1109       Initial Review   Current issues with Current Depression;Current Psychotropic Meds;Current Stress Concerns    Source of Stress Concerns Chronic Illness;Financial;Unable to participate in former interests or hobbies    Comments Holley is currently on a fixed income  with her disability check. She also has car trouble and states the money she receives each month is not enough to fix it.      Family Dynamics   Good Support System? Yes     Comments Pt has support from her aunt and friends      Barriers   Psychosocial barriers to participate in program Psychosocial barriers identified (see note)      Screening Interventions   Interventions Encouraged to exercise;Provide feedback about the scores to participant;To provide support and resources with identified psychosocial needs    Expected Outcomes Short Term goal: Utilizing psychosocial counselor, staff and physician to assist with identification of specific Stressors or current issues interfering with healing process. Setting desired goal for each stressor or current issue identified.;Long Term Goal: Stressors or current issues are controlled or eliminated.;Long Term goal: The participant improves quality of Life and PHQ9 Scores as seen by post scores and/or verbalization of changes;Short Term goal: Identification and review with participant of any Quality of Life or Depression concerns found by scoring the questionnaire.          Quality of Life Scores:  Scores of 19 and below usually indicate a poorer quality of life in these areas.  A difference of  2-3 points is a clinically meaningful difference.  A difference of 2-3 points in the total score of the Quality of Life Index has been associated with significant improvement in overall quality of life, self-image, physical symptoms, and general health in studies assessing change in quality of life.  PHQ-9: Review Flowsheet       08/22/2024  Depression screen PHQ 2/9  Decreased Interest 1  Down, Depressed, Hopeless 1  PHQ - 2 Score 2  Altered sleeping 2  Tired, decreased energy 1  Change in appetite 1  Feeling bad or failure about yourself  2  Trouble concentrating 1  Moving slowly or fidgety/restless 0  Suicidal thoughts 0  PHQ-9 Score 9   Difficult doing work/chores Somewhat difficult    Details       Data saved with a previous flowsheet row definition        Interpretation of Total Score  Total Score  Depression Severity:  1-4 = Minimal depression, 5-9 = Mild depression, 10-14 = Moderate depression, 15-19 = Moderately severe depression, 20-27 = Severe depression   Psychosocial Evaluation and Intervention:  Psychosocial Evaluation - 08/22/24 1205       Psychosocial Evaluation & Interventions   Interventions Stress management education;Relaxation education;Encouraged to exercise with the program and follow exercise prescription    Comments Initial PHQ-2/PHQ-9 score 2/9. Dawn Fowler states she is dealing with depression and stress. Her health and financial situation are the main contributors to her mental health barriers. She is taking psychotropic meds and just finished up counseling. Staff will provide education and relaxation techniques to help Dawn Fowler deal with her daily stressors.    Expected Outcomes For Dawn Fowler to have less depression and less stress while participating in the program    Continue Psychosocial Services  Follow up required by staff          Psychosocial Re-Evaluation:  Psychosocial Re-Evaluation     Row Name 08/25/24 0814 09/15/24 1551 09/17/24 0947 10/13/24 1508       Psychosocial Re-Evaluation   Current issues with Current Depression;Current Psychotropic Meds;Current Stress Concerns Current Depression;Current Psychotropic Meds;Current Stress Concerns Current Depression;Current Psychotropic Meds;Current Stress Concerns Current Depression;Current Psychotropic Meds;Current Stress Concerns    Comments Dawn Fowler is scheduled to start  PR class on 08/26/24. No new barriers or concerns since orientation. -- Monthly psy/soc re-eval is as follows: Dawn Fowler stills struggle with depression. She is currently in the hospital and has had to miss a week of class. Staff will provide Dawn Fowler with education on ways to reduce stress. No needs at this time. Monthly psy/soc re-eval is as follows: Dawn Fowler has returned to class since her hospital admission and seems to be in better spirits. She still struggles with  depression, but feels it is stable at this time. She is compliant with taking her psychotropic meds and has good support from her family. No needs at this time.    Expected Outcomes For Dawn Fowler to have less depression and stress while participating in the program. -- For Dawn Fowler to have less depression and stress while participating in the program. For Dawn Fowler to have less depression and stress while participating in the program.    Interventions Encouraged to attend Pulmonary Rehabilitation for the exercise;Stress management education;Relaxation education -- Encouraged to attend Pulmonary Rehabilitation for the exercise;Stress management education;Relaxation education Encouraged to attend Pulmonary Rehabilitation for the exercise;Stress management education;Relaxation education    Continue Psychosocial Services  Follow up required by staff -- Follow up required by staff Follow up required by staff       Psychosocial Discharge (Final Psychosocial Re-Evaluation):  Psychosocial Re-Evaluation - 10/13/24 1508       Psychosocial Re-Evaluation   Current issues with Current Depression;Current Psychotropic Meds;Current Stress Concerns    Comments Monthly psy/soc re-eval is as follows: Dawn Fowler has returned to class since her hospital admission and seems to be in better spirits. She still struggles with depression, but feels it is stable at this time. She is compliant with taking her psychotropic meds and has good support from her family. No needs at this time.    Expected Outcomes For Dawn Fowler to have less depression and stress while participating in the program.    Interventions Encouraged to attend Pulmonary Rehabilitation for the exercise;Stress management education;Relaxation education    Continue Psychosocial Services  Follow up required by staff          Education: Education Goals: Education classes will be provided on a weekly basis, covering required topics. Participant will state understanding/return demonstration  of topics presented.  Learning Barriers/Preferences:  Learning Barriers/Preferences - 08/22/24 1211       Learning Barriers/Preferences   Learning Barriers Sight   wears glasses   Learning Preferences None          Education Topics: Know Your Numbers Group instruction that is supported by a PowerPoint presentation. Instructor discusses importance of knowing and understanding resting, exercise, and post-exercise oxygen saturation, heart rate, and blood pressure. Oxygen saturation, heart rate, blood pressure, rating of perceived exertion, and dyspnea are reviewed along with a normal range for these values.  Flowsheet Row PULMONARY REHAB OTHER RESPIRATORY from 10/16/2024 in [facility] for Heart, Vascular, & Lung Health  Date 10/16/24  Educator EP  Instruction Review Code 1- Verbalizes Understanding    Exercise for the Pulmonary Patient Group instruction that is supported by a PowerPoint presentation. Instructor discusses benefits of exercise, core components of exercise, frequency, duration, and intensity of an exercise routine, importance of utilizing pulse oximetry during exercise, safety while exercising, and options of places to exercise outside of rehab.  Flowsheet Row PULMONARY REHAB OTHER RESPIRATORY from 10/09/2024 in [facility] for Heart, Vascular, & Lung Health  Date 10/09/24  Educator EP  Instruction Review  Code 1- Verbalizes Understanding    MET Level  Group instruction provided by PowerPoint, verbal discussion, and written material to support subject matter. Instructor reviews what METs are and how to increase METs.  Flowsheet Row PULMONARY REHAB OTHER RESPIRATORY from 09/04/2024 in [facility] for Heart, Vascular, & Lung Health  Date 09/04/24  Educator EP  Instruction Review Code 1- Verbalizes Understanding    Pulmonary Medications Verbally interactive group education provided by instructor  with focus on inhaled medications and proper administration. Flowsheet Row PULMONARY REHAB OTHER RESPIRATORY from 10/02/2024 in [facility] for Heart, Vascular, & Lung Health  Date 10/02/24  Educator RT  Instruction Review Code 1- Verbalizes Understanding    Anatomy and Physiology of the Respiratory System Group instruction provided by PowerPoint, verbal discussion, and written material to support subject matter. Instructor reviews respiratory cycle and anatomical components of the respiratory system and their functions. Instructor also reviews differences in obstructive and restrictive respiratory diseases with examples of each.    Oxygen Safety Group instruction provided by PowerPoint, verbal discussion, and written material to support subject matter. There is an overview of What is Oxygen and Why do we need it.  Instructor also reviews how to create a safe environment for oxygen use, the importance of using oxygen as prescribed, and the risks of noncompliance. There is a brief discussion on traveling with oxygen and resources the patient may utilize.   Oxygen Use Group instruction provided by PowerPoint, verbal discussion, and written material to discuss how supplemental oxygen is prescribed and different types of oxygen supply systems. Resources for more information are provided.    Breathing Techniques Group instruction that is supported by demonstration and informational handouts. Instructor discusses the benefits of pursed lip and diaphragmatic breathing and detailed demonstration on how to perform both.     Risk Factor Reduction Group instruction that is supported by a PowerPoint presentation. Instructor discusses the definition of a risk factor, different risk factors for pulmonary disease, and how the heart and lungs work together. Flowsheet Row PULMONARY REHAB OTHER RESPIRATORY from 08/28/2024 in [facility] for Heart, Vascular, &  Lung Health  Date 08/28/24  Educator EP  Instruction Review Code 1- Verbalizes Understanding    Pulmonary Diseases Group instruction provided by PowerPoint, verbal discussion, and written material to support subject matter. Instructor gives an overview of the different type of pulmonary diseases. There is also a discussion on risk factors and symptoms as well as ways to manage the diseases. Flowsheet Row PULMONARY REHAB OTHER RESPIRATORY from 09/11/2024 in [facility] for Heart, Vascular, & Lung Health  Date 09/11/24  Educator RT  Instruction Review Code 1- Verbalizes Understanding    Stress and Energy Conservation Group instruction provided by PowerPoint, verbal discussion, and written material to support subject matter. Instructor gives an overview of stress and the impact it can have on the body. Instructor also reviews ways to reduce stress. There is also a discussion on energy conservation and ways to conserve energy throughout the day.   Warning Signs and Symptoms Group instruction provided by PowerPoint, verbal discussion, and written material to support subject matter. Instructor reviews warning signs and symptoms of stroke, heart attack, cold and flu. Instructor also reviews ways to prevent the spread of infection.   Other Education Group or individual verbal, written, or video instructions that support the educational goals of the pulmonary rehab program.    Knowledge Questionnaire Score:  Knowledge Questionnaire  Score - 08/22/24 1053       Knowledge Questionnaire Score   Pre Score 17/18          Core Components/Risk Factors/Patient Goals at Admission:  Personal Goals and Risk Factors at Admission - 08/22/24 1117       Core Components/Risk Factors/Patient Goals on Admission   Improve shortness of breath with ADL's Yes    Intervention Provide education, individualized exercise plan and daily activity instruction to help decrease symptoms of  SOB with activities of daily living.    Expected Outcomes Short Term: Improve cardiorespiratory fitness to achieve a reduction of symptoms when performing ADLs;Long Term: Be able to perform more ADLs without symptoms or delay the onset of symptoms    Stress Yes    Intervention Offer individual and/or small group education and counseling on adjustment to heart disease, stress management and health-related lifestyle change. Teach and support self-help strategies.;Refer participants experiencing significant psychosocial distress to appropriate mental health specialists for further evaluation and treatment. When possible, include family members and significant others in education/counseling sessions.    Expected Outcomes Short Term: Participant demonstrates changes in health-related behavior, relaxation and other stress management skills, ability to obtain effective social support, and compliance with psychotropic medications if prescribed.;Long Term: Emotional wellbeing is indicated by absence of clinically significant psychosocial distress or social isolation.          Core Components/Risk Factors/Patient Goals Review:   Goals and Risk Factor Review     Row Name 08/25/24 0816 09/17/24 1013 10/13/24 1511         Core Components/Risk Factors/Patient Goals Review   Personal Goals Review Improve shortness of breath with ADL's;Develop more efficient breathing techniques such as purse lipped breathing and diaphragmatic breathing and practicing self-pacing with activity.;Stress Improve shortness of breath with ADL's;Develop more efficient breathing techniques such as purse lipped breathing and diaphragmatic breathing and practicing self-pacing with activity.;Stress Improve shortness of breath with ADL's;Develop more efficient breathing techniques such as purse lipped breathing and diaphragmatic breathing and practicing self-pacing with activity.;Stress     Review Dawn Fowler is scheduled to start the PR program on  08/26/24. Unable to asses her goals at this time. Monthly review of patients Core Components/Risk Factors/Patient Goals are as follows: Goal progressing for improving shortness of breath. Dawn Fowler is currently exercising on 15L- NRB mask to keep sats >90%. She is exercising on the [place] and walking the track. Goal progressing for developing more efficient breathing techniques such as purse lipped breathing and diaphragmatic breathing; and practicing self-pacing with activity. Goal progressing on reducing stress. We will continue to monitor her progress throughout the program. Monthly review of patients Core Components/Risk Factors/Patient Goals are as follows: Goal progressing for improving shortness of breath. Dawn Fowler is currently exercising on 15L- NRB mask to keep sats >90%. She is exercising on the [employer] and recumbant bike. Goal progressing for developing more efficient breathing techniques such as purse lipped breathing and diaphragmatic breathing; and practicing self-pacing with activity. Goal progressing on reducing stress. We will continue to monitor her progress throughout the program.     Expected Outcomes To improve shortness of breath with ADL's, develop more efficient breathing techniques such as purse lipped breathing and diaphragmatic breathing; and practicing self-pacing with activity and have less stress. To improve shortness of breath with ADL's, develop more efficient breathing techniques such as purse lipped breathing and diaphragmatic breathing; and practicing self-pacing with activity and have less stress. To improve shortness of breath with ADL's, develop more efficient breathing  techniques such as purse lipped breathing and diaphragmatic breathing; and practicing self-pacing with activity and have less stress.        Core Components/Risk Factors/Patient Goals at Discharge (Final Review):   Goals and Risk Factor Review - 10/13/24 1511       Core Components/Risk Factors/Patient Goals Review    Personal Goals Review Improve shortness of breath with ADL's;Develop more efficient breathing techniques such as purse lipped breathing and diaphragmatic breathing and practicing self-pacing with activity.;Stress    Review Monthly review of patients Core Components/Risk Factors/Patient Goals are as follows: Goal progressing for improving shortness of breath. Dawn Fowler is currently exercising on 15L- NRB mask to keep sats >90%. She is exercising on the [place] and recumbant bike. Goal progressing for developing more efficient breathing techniques such as purse lipped breathing and diaphragmatic breathing; and practicing self-pacing with activity. Goal progressing on reducing stress. We will continue to monitor her progress throughout the program.    Expected Outcomes To improve shortness of breath with ADL's, develop more efficient breathing techniques such as purse lipped breathing and diaphragmatic breathing; and practicing self-pacing with activity and have less stress.          ITP Comments:Pt is making expected progress toward Pulmonary Rehab goals after completing 11 session(s). Recommend continued exercise, life style modification, education, and utilization of breathing techniques to increase stamina and strength, while also decreasing shortness of breath with exertion.  Dr. Slater Staff is Medical Director for Pulmonary Rehab at [facility].     Comments:      [1]  Current Outpatient Medications:    acetaminophen  (TYLENOL ) 500 MG tablet, Take 1,000 mg by mouth every 6 (six) hours as needed for moderate pain or headache., Disp: , Rfl:    albuterol  (VENTOLIN  HFA) 108 (90 Base) MCG/ACT inhaler, Inhale 2 puffs into the lungs every 6 (six) hours as needed for wheezing or shortness of breath., Disp: 8 g, Rfl: 5   allopurinol  (ZYLOPRIM ) 100 MG tablet, TAKE 1 TABLET(100 MG) BY MOUTH DAILY, Disp: 30 tablet, Rfl: 2   ALYQ  20 MG tablet, Take 20 mg by mouth daily., Disp: , Rfl:    amLODipine   (NORVASC ) 5 MG tablet, Take 1 tablet (5 mg total) by mouth daily., Disp: 90 tablet, Rfl: 3   apixaban  (ELIQUIS ) 5 MG TABS tablet, Take 1 tablet (5 mg total) by mouth 2 (two) times daily., Disp: 60 tablet, Rfl: 9   Cholecalciferol  (VITAMIN D3) 50 MCG (2000 UT) capsule, Take 2,000 Units by mouth in the morning., Disp: , Rfl:    colchicine 0.6 MG tablet, Take 0.6 mg by mouth daily as needed (gout flare)., Disp: , Rfl:    diclofenac  Sodium (VOLTAREN ) 1 % GEL, Apply 2 g topically 3 (three) times daily as needed (pain.)., Disp: , Rfl:    empagliflozin  (JARDIANCE ) 10 MG TABS tablet, Take 1 tablet (10 mg total) by mouth daily., Disp: 30 tablet, Rfl: 6   famotidine  (PEPCID ) 20 MG tablet, Take 20 mg by mouth as needed for heartburn or indigestion., Disp: , Rfl:    fluticasone  furoate-vilanterol (BREO ELLIPTA ) 200-25 MCG/ACT AEPB, Inhale 1 puff into the lungs daily., Disp: 60 each, Rfl: 11   gabapentin  (NEURONTIN ) 100 MG capsule, Take 100 mg by mouth daily as needed (Pain)., Disp: , Rfl:    ipratropium-albuterol  (DUONEB) 0.5-2.5 (3) MG/3ML SOLN, INHALE THE CONTENTS OF 1 VIAL VIA NEBULIZER EVERY 6 HOURS AS NEEDED FOR WHEEZING OR SHORTNESS OF BREATH, Disp: 1080 mL, Rfl: 2  losartan  (COZAAR ) 25 MG tablet, Take 1 tablet (25 mg total) by mouth daily., Disp: 90 tablet, Rfl: 3   macitentan  (OPSUMIT ) 10 MG tablet, Take 1 tablet (10 mg total) by mouth daily., Disp: 90 tablet, Rfl: 3   OXYGEN, Inhale 4-8 L into the lungs continuous., Disp: , Rfl:    pantoprazole  (PROTONIX ) 40 MG tablet, Take 1 tablet (40 mg total) by mouth daily., Disp: 30 tablet, Rfl: 11   Riociguat  (ADEMPAS ) 2 MG TABS, Take 1 mg by mouth in the morning, at noon, and at bedtime., Disp: , Rfl:    Selexipag  (UPTRAVI ) 800 MCG TABS, Take 1 tablet (800 mcg total) by mouth 2 (two) times daily., Disp: 60 tablet, Rfl: 0   sertraline  (ZOLOFT ) 50 MG tablet, Take 1 tablet (50 mg total) by mouth 2 (two) times daily., Disp: 60 tablet, Rfl: 0   spironolactone   (ALDACTONE ) 25 MG tablet, TAKE 1/2 TABLET(12.5 MG) BY MOUTH DAILY, Disp: 45 tablet, Rfl: 3   tirzepatide  (ZEPBOUND ) 10 MG/0.5ML Pen, Inject 10 mg into the skin once a week., Disp: 2 mL, Rfl: 0   torsemide  (DEMADEX ) 20 MG tablet, Take 2 tablets (40 mg total) by mouth daily., Disp: 60 tablet, Rfl: 6 [2]  Social History Tobacco Use  Smoking Status Never  Smokeless Tobacco Never

## 2024-10-28 ENCOUNTER — Encounter (HOSPITAL_COMMUNITY)
Admission: RE | Admit: 2024-10-28 | Discharge: 2024-10-28 | Disposition: A | Discharge status: Home / Self Care | Source: Ambulatory Visit | Attending: Pulmonary Disease | Admitting: Pulmonary Disease

## 2024-10-28 VITALS — Wt 215.4 lb

## 2024-10-28 DIAGNOSIS — I2729 Other secondary pulmonary hypertension: Secondary | ICD-10-CM | POA: Diagnosis not present

## 2024-10-28 NOTE — Procedures (Signed)
" °  Indications for Polysomnography The patient is a 58 year old Female who is 5' 10 and weighs 220.0 lbs. Her BMI equals 31.9.  A full night polysomnogram was performed to evaluate for Obstructive Sleep Apnea.  Patient reported taking her medications at 9:00 pm.UptraviAdempasEliquisMultivitaminMelatonin Polysomnogram Data A full night polysomnogram recorded the standard physiologic parameters including EEG, EOG, EMG, EKG, nasal and oral airflow.  Respiratory parameters of chest and abdominal movements were recorded with Respiratory Inductance Plethysmography belts.   Oxygen saturation was recorded by pulse oximetry.  Sleep Architecture The total recording time of the polysomnogram was 434.9 minutes.  The total sleep time was 131.5 minutes.  The patient spent 62.7% of total sleep time in Stage N1, 37.3% in Stage N2, 0.0% in Stages N3, and 0.0% in REM.  Sleep latency was 74.2 minutes.   REM latency was 0 minutes.  Sleep Efficiency was 30.2%.  Wake after Sleep Onset time was 229.0 minutes.  Respiratory Events The polysomnogram revealed a presence of 0 obstructive, 0 central, and 0 mixed apneas resulting in an Apnea index of 0 events per hour.  There were 8 hypopneas (GreaterEqual to3% desaturation and/or arousal) resulting in an Apnea\Hypopnea Index (AHI  GreaterEqual to3% desaturation and/or arousal) of 3.7 events per hour.  There were 2 hypopneas (GreaterEqual to4% desaturation) resulting in an Apnea\Hypopnea Index (AHI GreaterEqual to4% desaturation) of 0.9 events per hour.  There were 28 Respiratory  Effort Related Arousals resulting in a RERA index of 12.8 events per hour. The Respiratory Disturbance Index is 16.4 events per hour.  The snore index was 0 events per hour.  Mean oxygen saturation was 91.7%.  The lowest oxygen saturation during sleep was 85.0%.  Time spent LessEqual to88% oxygen saturation was  minutes ().  Limb Activity There were 0 total limb movements recorded, of this total, 0  were classified as PLMs.  Cardiac Summary The average pulse rate was 72.5 bpm.  The minimum pulse rate was 64.0 bpm while the maximum pulse rate was 96.0 bpm.  Cardiac rhythm was NSR with occasional PACs and PVCs.  Diagnosis: Nondiagnostic Sleep Study due to inadequate sleep time and no REM sleep Nocturnal Hypoxemia  Recommendations: 1. Recommend Itamar HST on her current home O2 dose. 2. Healthy sleep recommendations include: adequate nightly sleep (normal 7-9 hrs/night), avoidance of caffeine after noon and alcohol near bedtime, and maintaining a sleep environment that is cool, dark and quiet. 3. Weight loss for overweight patients is recommended. 4. Snoring recommendations include: weight loss where appropriate, side sleeping, and avoidance of alcohol before bed. 5. Operation of motor vehicle or dangerous equipment must be avoided when feeling drowsy, excessively sleepy, or mentally fatigued. 6. An ENT consultation which may be useful for specific causes of and possible treatment of bothersome snoring . 7. Weight loss may be of benefit in reducing the severity of snoring    This study was personally reviewed and electronically signed by: Dr. Wilbert Bihari Accredited Board Certified in Sleep Medicine Date/Time: 10/28/2024 4:23PM "

## 2024-10-28 NOTE — Progress Notes (Signed)
 Daily Session Note  Patient Details  Name: Dawn Fowler MRN: [id number] Date of Birth: 16-Jan-1966 Referring Provider:   Conrad Ports Pulmonary Rehab Walk Test from 08/22/2024 in [facility]  Referring Provider [name]    Encounter Date: 10/28/2024  Check In:  Session Check In - 10/28/24 1027       Check-In   Supervising physician immediately available to respond to emergencies [facility] MD immediately available    Physician(s) Orren Fabry, NP    Location [facility]    Staff Present Ronal Levin, RN, BSN;Randi Midge BS, ACSM-CEP, Exercise Physiologist;Kaylee Nicholaus, MS, ACSM-CEP, Exercise Physiologist    Virtual Visit No    Medication changes reported     No    Fall or balance concerns reported    No    Tobacco Cessation No Change    Warm-up and Cool-down Performed as group-led instruction    Resistance Training Performed Yes    VAD Patient? No    PAD/SET Patient? No      Pain Assessment   Currently in Pain? No/denies          Capillary Blood Glucose: No results found for this or any previous visit (from the past 24 hours).    Tobacco Use History[1]  Goals Met:  Independence with exercise equipment Exercise tolerated well Queuing for purse lip breathing No report of concerns or symptoms today Strength training completed today  Goals Unmet:  Not Applicable  Comments: Service time is from 1018 to 1131    Dr. Slater [name] is Medical Director for Pulmonary Rehab at [facility].     [1]  Social History Tobacco Use  Smoking Status Never  Smokeless Tobacco Never

## 2024-10-29 ENCOUNTER — Telehealth: Payer: Self-pay | Admitting: *Deleted

## 2024-10-29 NOTE — Telephone Encounter (Signed)
-----   Message from Wilbert Bihari, MD sent at 10/28/2024  4:28 PM EST ----- Please disregard initial note for normal sleep study.  Her sleep study is non diagnostic due to insufficient sleep time and no REM sleep.  I would like her to have an Itamar HST on her current O2 dose at home.

## 2024-10-29 NOTE — Telephone Encounter (Signed)
 Dawn Wilbert SAUNDERS, MD  P Cv Div Sleep Studies No evidence of OSA.  Continue on current home O2

## 2024-10-31 ENCOUNTER — Other Ambulatory Visit (HOSPITAL_COMMUNITY): Payer: Self-pay | Admitting: *Deleted

## 2024-10-31 ENCOUNTER — Telehealth (HOSPITAL_BASED_OUTPATIENT_CLINIC_OR_DEPARTMENT_OTHER): Payer: Self-pay | Admitting: Pulmonary Disease

## 2024-10-31 DIAGNOSIS — G4734 Idiopathic sleep related nonobstructive alveolar hypoventilation: Secondary | ICD-10-CM

## 2024-10-31 DIAGNOSIS — R1310 Dysphagia, unspecified: Secondary | ICD-10-CM

## 2024-10-31 DIAGNOSIS — R1312 Dysphagia, oropharyngeal phase: Secondary | ICD-10-CM

## 2024-10-31 DIAGNOSIS — J9611 Chronic respiratory failure with hypoxia: Secondary | ICD-10-CM

## 2024-10-31 NOTE — Telephone Encounter (Signed)
"  Pt scheduled 11-07-24  "

## 2024-10-31 NOTE — Telephone Encounter (Signed)
 I reviewed sleep test with patient and due to inadequate sleep time, test was nondiagnostic. Will need home sleep study on current home O2 settings. Sleep study ordered.  Patient also requiring up to 15L O2 at pulmonary rehab. Previously had ordered oxymizer on 09/11/24 however never delivered despite DME receiving order. Will contact patient DME.  Discussed with Cardiology additional work-up regarding her hypoxemia. Discussed if referral to [facility] would be beneficial with her degree of oxygen dependence. Will hold on this action for now. Cardiology will plan to evaluate at next visit and consider repeating RHC.

## 2024-11-03 ENCOUNTER — Telehealth (HOSPITAL_BASED_OUTPATIENT_CLINIC_OR_DEPARTMENT_OTHER): Payer: Self-pay | Admitting: Pulmonary Disease

## 2024-11-03 NOTE — Telephone Encounter (Signed)
 pt called back stated she wants the oxymizer pendant and not the mustache one.

## 2024-11-03 NOTE — Telephone Encounter (Signed)
 Per [employer] The cost would be the same as the other company because that is the price from the insurance...SABRASABRABut our designer, industrial/product is unsure when we would have in stock which is why he told me to refuse this referral  Spoke with patient and she can not afford the $40 for this device. Please advise.

## 2024-11-04 ENCOUNTER — Encounter (HOSPITAL_COMMUNITY)

## 2024-11-04 ENCOUNTER — Telehealth (HOSPITAL_COMMUNITY): Payer: Self-pay

## 2024-11-04 NOTE — Telephone Encounter (Signed)
 Spoke with pt and notified the cost if the same for either one, she asked about billing I told her she can reach out to the DME company to ask about this as I do not know. NFN

## 2024-11-04 NOTE — Telephone Encounter (Signed)
 Patient's daughter called stating her mother was running a temperature last night and won't be in for 10:15 PR class this morning.

## 2024-11-05 NOTE — Progress Notes (Signed)
 "      ID:  Dawn Fowler, DOB 1966-05-31, MRN [id number]   Provider location: [place] [facility] Type of Visit: Established patient   PCP:  [name], MD HF Cardiologist:  Dr. Rolan   HPI: Dawn Fowler is a 59 y.o. 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  She is on CPAP at home and 6L home oxygen (says she has been on oxygen for years.).  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  She additionally has significant HTN.  She had been on Opsumit  10 + tadalafil  20 mg daily but ran out of this prior to her 2/21 admission. She also has a history of prior DVT after long train ride, has had 2 negative V/Q scans.  She was hospitalized for COVID-19 at [facility] in 1/21.    She was seen by [facility] Pul[facility] after discharge from COVID admission and was noted to be volume overloaded, Lasix  was changed to torsemide  but she was unable to get to pharmacy to pick up. Subsequently, she developed progressive wt gain, nearly 20 lb and increased dyspnea, prompting her to report back to the ED in 2/21, where she was found to be in acute on chronic diastolic HF and readmitted for IV diuresis and AHF consultation. Echo in 2/21 showed LVEF 55-60%, moderate RV dilation/moderate RV dysfunction. RHC 2/21 showed elevated left and right heart filling pressures with severe pulmonary hypertension. PFTs completed showing severe restriction and severely decreased DLCO. High resolution CT was concerning  for small vessel disease, no ILD, and concerning for PAH.  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 She was continued on IV diuretics and had good response, diuresing 32 lb. She was transitioned to torsemide  40 mg/20 mg.  Opsumit  and tadalafil  restarted.   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  She is on Eliquis . RHC in 9/21 showed normal RA pressure and PCWP, moderate-severe pulmonary arterial hypertension.   Echo 5/22 showed EF 55% with mildly enlarged RV with mild RV dysfunction, mildly D-shaped septum, PASP 48 with normal IVC.   In 2/23, she had bilateral breast reduction surgery. She developed significant volume overload and her torsemide  was increased.  She moved to [place]  and had RHC there in 2/25; normal PCWP and RA pressure but PA 87/32 mean 54 with PVR 11 WU and PCWP 15, CI 1.8 (thermo). V/Q scan in 2/25 showed no sign of chronic PEs.   Follow up 2/25, recently moved back to [place] from [place] . Sotatercept initiated given ongoing significantly elevated PA pressures.   Echo 5/25 showed EF 55-60%, D-shaped septum with moderate RV dysfunction, PASP 49 mmHg.   Headaches with sotatercept, stopped it.   RHC 9/25: mildly elevated PCWP, normal RA pressure, moderate pulmonary arterial hypertension with PVR 5.2 WU, preserved CO. Riociguat  up-titrated.  Admitted 11/25 for AECOPD. Echo showed EF 55-60%, G1DD, RV moderately reduced, IVC dilated.  Today she returns for HF follow up. Overall feeling fatigued. Multiple non-cardiac complaints today. Having trouble with knee OA. Has GERD at night. Has not received oxymizer yet. Doing Pulmonary Rehab, had to turn up oxygen up to 15 L at a visit. Now on 4-8 L oxygen.  Denies palpitations, abnormal bleeding, CP, dizziness, edema, or PND/Orthopnea. Appetite ok. Weight at home 216 pounds. Taking all medications. Wears CPAP. She was told she may need a heart transplant due to increasing O2 requirements and is understandably anxious.   ECG (personally reviewed): NSR with PVC 89 bpm  Labs (5/25): K 4.3, creatinine 2.04 Labs (6/25): K 4, creatinine 1.84 Labs (7/25): K 3.9, creatinine 1.82, BNP 98, hgb 16.4 Labs (9/25): K 3.9, creatinine  2.36 Labs (11/25): K 3.6, creatinine 2.19  6 minute walk (9/21): 168 m 6 minute walk (10/21): 122 m 6 minute walk (3/22): 107 m 6 minute walk (2/25): 229 m 6 minute walk (5/25): 152 m, oxygen sats 90%-86% on 4 L 6 minute walk (7/25): 243 m 6 minutes (10/25): 167 m, oxygen ranged 80-88% on 6L  PMH: 1. OHS/OSA: Uses home oxygen.  2. CKD stage IV 3. COVID-19 PNA in 12/20 4. Restrictive lung disease: PFTs (2/21) with severe restriction, severely decreased DLCO.  - High resolution CT chest (2/21): no interstitial lung disease, small vessel disease.  - Breast reduction surgery 2/23 5. Gout 6. HTN 7. DVT: Remote, after train ride.  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  8. Pulmonary hypertension: RHC (2/21) with mean RA 14, PA 89/33 mean 54, mean PCWP 34, CI 2.22, PVR 3.87 WU => mixed pulmonary arterial/pulmonary venous hypertension.  - V/Q scan (2/21): No evidence for chronic PE.  - Echo (2/21): EF 55-60%, moderately dilated RV with moderately decreased systolic function.  - HIV and rheumatologic serologies negative.  - RHC (9/21): mean RA 9, PA 68/23 mean 40, mean PCWP 11, PVR 5.6 WU, no step up in oxygen saturation so no evidence for left to right shunting.  - Echo (5/22): EF 55% with mildly enlarged RV with mild RV dysfunction, mildly D-shaped septum, PASP 48 with normal IVC - RHC (2/25): mean RA 7, PA 87/32 mean 54, mean PCWP 15, CI thermo 1.8, PVR 11 WU. - Echo (5/25):  EF 55-60%, D-shaped septum with moderate RV dysfunction, PASP 49 mmHg.  - RHC (9/25): RA 3, PA 64/24 (42), PCWP 17, CO/CI (Fick) 4.81/2.47, PVR 5.2 WU - Echo (11/25): EF 55-60%, G1DD, RV moderately reduced, IVC dilated. 9. Depression  Current Outpatient Medications  Medication Sig Dispense Refill   acetaminophen  (TYLENOL ) 500 MG tablet Take 1,000 mg by mouth every 6 (six) hours as needed for moderate pain or headache.     albuterol  (VENTOLIN  HFA) 108 (90 Base) MCG/ACT inhaler Inhale 2 puffs into  the lungs every 6 (six) hours as needed for wheezing or shortness of breath. 8 g 5   allopurinol  (ZYLOPRIM ) 100 MG tablet TAKE 1 TABLET(100 MG) BY MOUTH DAILY 30 tablet 2   ALYQ  20 MG tablet Take 20 mg by mouth daily.     amLODipine  (NORVASC ) 5 MG tablet Take 1 tablet (5 mg total) by mouth daily. 90 tablet 3   apixaban  (ELIQUIS ) 5 MG TABS tablet Take 1 tablet (5 mg total) by mouth 2 (two) times daily. 60 tablet 9   Cholecalciferol  (VITAMIN D3) 50 MCG (2000 UT) capsule Take 2,000 Units by mouth in the morning.     colchicine 0.6 MG tablet Take 0.6 mg by mouth daily as needed (gout flare).     diclofenac  Sodium (VOLTAREN ) 1 % GEL Apply 2 g topically 3 (three) times daily as needed (pain.).     empagliflozin  (JARDIANCE ) 10 MG TABS tablet Take 1 tablet (10 mg total) by mouth daily.  30 tablet 6   famotidine  (PEPCID ) 20 MG tablet Take 20 mg by mouth as needed for heartburn or indigestion.     fluticasone  furoate-vilanterol (BREO ELLIPTA ) 200-25 MCG/ACT AEPB Inhale 1 puff into the lungs daily. 60 each 11   gabapentin  (NEURONTIN ) 100 MG capsule Take 100 mg by mouth daily as needed (Pain).     ipratropium-albuterol  (DUONEB) 0.5-2.5 (3) MG/3ML SOLN INHALE THE CONTENTS OF 1 VIAL VIA NEBULIZER EVERY 6 HOURS AS NEEDED FOR WHEEZING OR SHORTNESS OF BREATH 1080 mL 2   losartan  (COZAAR ) 25 MG tablet Take 1 tablet (25 mg total) by mouth daily. 90 tablet 3   macitentan  (OPSUMIT ) 10 MG tablet Take 1 tablet (10 mg total) by mouth daily. 90 tablet 3   OXYGEN Inhale 4-8 L into the lungs continuous.     pantoprazole  (PROTONIX ) 40 MG tablet Take 1 tablet (40 mg total) by mouth daily. 30 tablet 11   Riociguat  (ADEMPAS ) 2 MG TABS Take 1 mg by mouth in the morning, at noon, and at bedtime.     Selexipag  (UPTRAVI ) 800 MCG TABS Take 1 tablet (800 mcg total) by mouth 2 (two) times daily. 60 tablet 0   sertraline  (ZOLOFT ) 50 MG tablet Take 1 tablet (50 mg total) by mouth 2 (two) times daily. 60 tablet 0   spironolactone   (ALDACTONE ) 25 MG tablet TAKE 1/2 TABLET(12.5 MG) BY MOUTH DAILY 45 tablet 3   tirzepatide  (ZEPBOUND ) 10 MG/0.5ML Pen Inject 10 mg into the skin once a week. 2 mL 0   torsemide  (DEMADEX ) 20 MG tablet Take 2 tablets (40 mg total) by mouth daily. 60 tablet 6   No current facility-administered medications for this encounter.   Allergies  Allergen Reactions   Adhesive [Tape] Hives    Tolerates paper tape   Latex Dermatitis   Succinylcholine Other (See Comments)    Have trouble waking up   Tizanidine Hives   Social History   Socioeconomic History   Marital status: Single    Spouse name: Not on file   Number of children: 1   Years of education: Not on file   Highest education level: Master's degree (e.g., MA, MS, MEng, MEd, MSW, MBA)  Occupational History   Not on file  Tobacco Use   Smoking status: Never   Smokeless tobacco: Never  Vaping Use   Vaping status: Never Used  Substance and Sexual Activity   Alcohol use: Not Currently   Drug use: Not Currently   Sexual activity: Not Currently    Partners: Male    Birth control/protection: Post-menopausal    Comment: 1st intercourse- 11, partners- more than 5  Other Topics Concern   Not on file  Social History Narrative   Lives with aunt   Social Drivers of Health   Tobacco Use: Low Risk (11/07/2024)   Patient History    Smoking Tobacco Use: Never    Smokeless Tobacco Use: Never    Passive Exposure: Not on file  Financial Resource Strain: High Risk (07/12/2022)   Overall Financial Resource Strain (CARDIA)    Difficulty of Paying Living Expenses: Very hard  Food Insecurity: No Food Insecurity (09/16/2024)   Epic    Worried About Programme Researcher, Broadcasting/film/video in the Last Year: Never true    Ran Out of Food in the Last Year: Never true  Transportation Needs: No Transportation Needs (09/16/2024)   Epic    Lack of Transportation (Medical): No    Lack of Transportation (Non-Medical): No  Physical Activity:  Not on file  Stress: Not on  file  Social Connections: Moderately Isolated (09/16/2024)   Social Connection and Isolation Panel    Frequency of Communication with Friends and Family: Twice a week    Frequency of Social Gatherings with Friends and Family: Twice a week    Attends Religious Services: 1 to 4 times per year    Active Member of [employer] or Organizations: No    Attends Banker Meetings: Never    Marital Status: Never married  Intimate Partner Violence: Not At Risk (09/16/2024)   Epic    Fear of Current or Ex-Partner: No    Emotionally Abused: No    Physically Abused: No    Sexually Abused: No  Depression (PHQ2-9): Medium Risk (08/22/2024)   Depression (PHQ2-9)    PHQ-2 Score: 9  Alcohol Screen: Not on file  Housing: Low Risk (09/16/2024)   Epic    Unable to Pay for Housing in the Last Year: No    Number of Times Moved in the Last Year: 0    Homeless in the Last Year: No  Utilities: Not At Risk (09/16/2024)   Epic    Threatened with loss of utilities: No  Health Literacy: Not on file   Family History  Problem Relation Age of Onset   COPD Father    Cancer Mother        gallbladder and bladder    Hypertension Brother    Breast cancer Other    Diabetes Son    Breast cancer Cousin    Breast cancer Cousin    Pulmonary Hypertension Neg Hx    Wt Readings from Last 3 Encounters:  11/07/24 98.2 kg (216 lb 6.4 oz)  10/28/24 97.7 kg (215 lb 6.2 oz)  10/21/24 98.8 kg (217 lb 13 oz)   BP 118/78   Pulse 85   Ht 5' (1.524 m)   Wt 98.2 kg (216 lb 6.4 oz)   LMP  (LMP Unknown) Comment: pmb last 2-3 months  SpO2 94% Comment: 5 Ls of o2  BMI 42.26 kg/m   PHYSICAL EXAM:  General:  NAD. No resp difficulty, walked into clinic with RW on oxygen, fatigued-appearing HEENT: Normal Neck: Supple. No JVD. Cor: Regular rate & rhythm. No rubs, gallops or murmurs. Lungs: Diminished Abdomen: Soft, obese, nontender, nondistended.  Extremities: No cyanosis, clubbing, rash, edema Neuro: Alert &  oriented x 3, moves all 4 extremities w/o difficulty. Affect pleasant.  ASSESSMENT & PLAN: 1. Chronic diastolic CHF with significant RV dysfunction: Echo in 2/21 with LVEF 55-60%, moderate RV dilation/moderate RV dysfunction.  RHC 2/18 showed elevated left and right heart filling pressures with severe pulmonary hypertension.  Suspect mixed pulmonary venous/pulmonary arterial hypertension in setting of significant LV diastolic dysfunction (PVR 3.87 WU).  Repeat RHC in 9/21 with moderate-severe PAH, normal filling pressures, and PVR 5.6 WU.  No evidence for left=>right shunting.  Echo in 5/22 showed EF 55% with mildly enlarged RV with mild RV dysfunction, mildly D-shaped septum, PASP 48 with normal IVC.  Echo in 5/25 showed EF 55-60%, D-shaped septum with moderate RV dysfunction, PASP 49 mmHg.  RHC 9/25 showed mildly elevated PCWP with normal RA pressures, preserved CO and moderate PAH with PVR 5.2 WU. Stable NYHA class III symptoms, she is not volume overloaded. Suspect symptoms related to physical deconditioning, OSA and body habitus. - Continue torsemide  40 mg daily. BMET/proBNP today.  - Continue spiro 12.5 mg daily. - Continue Farxiga  10 mg daily.  2. Pulmonary hypertension:  Suspect that this is a mixed picture with group 2 and group 3 PH (related to diastolic LV dysfunction and OHS/OSA). However, concerned for component of group 1 PAH as well.  V/Q scan not suggestive of chronic PEs and high resolution CT chest in 2/21 was not suggestive of ILD. HIV and rheumatologic serologies negative.  RHC showed severe mixed pulmonary venous/pulmonary arterial hypertension, repeat RHC after diuresis in 9/21 showed PAH. PFTs completed in 2/21 showed severe restriction, severely decreased DLCO (?restriction due to body habitus).  Echo in 5/25 showed D-shaped septum with moderate RV dysfunction and PASP 49 mmHg.  V/Q scan in 2/25 again showed no evidence for chronic PE, and RHC in 2/25 showed normal RA pressure, severe  pulmonary arterial hypertension with PVR 11 WU, and PCWP borderline at 15 with low CI.  She is on Opsumit , riociguat  and Uptravi  800 mcg bid.  She did not tolerate sotatercept due to headache.  Repeat RHC 9/25 showed moderate PAH with PVR 5.2 WU, preserved CO and mildly elevated PCWP of 17, with normal RA pressure. - Recent increase in her oxygen requirements, however she is back on baseline oxygen requirements. She is on 2 mg tid of riociguat , will attempt to increase to goal dose of 2.5 tid.  - Continue Opsumit  10 mg daily. - She is tolerating Uptravi  800 mcg bid now, she has not tolerated uptitration.  - deferred due to knee OA. - RHC as above.  - Continue to maintain oxygen saturation with home O2 and CPAP. Weight loss also would be helpful.  She has had a breast reduction surgery which may decrease lung restriction.  3. HTN: BP controlled.  - Continue current losartan  and amlodipine .  4. CKD stage 3. Baseline SCr 2 - Continue SGLT2-i.  - Followup with nephrology.  5. OHS/OSA: Now s/p breast reduction surgery which hopefully will help with OHS. - Compliant w/ CPAP and home oxygen 6. 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  As she is at baseline sedentary and has had recurrent DVTs, would continue Eliquis  long-term.   7. Restrictive lung disease: Now s/p breast reduction surgery.  8. Obesity: Body mass index is 42.26 kg/m.  - She is on tirzepatide  and tolerating current dose.  Follow up in 6 weeks with Dr. Rolan. If symptoms not improved or worsen, consider repeat RHC.  Signed, Harlene CHRISTELLA Gainer, FNP  11/07/2024  [facility] [address] [facility] [place] [address] [phone] (office) [phone] (fax) "

## 2024-11-06 ENCOUNTER — Telehealth (HOSPITAL_COMMUNITY): Payer: Self-pay

## 2024-11-06 ENCOUNTER — Encounter (HOSPITAL_COMMUNITY)
Admission: RE | Admit: 2024-11-06 | Discharge: 2024-11-06 | Disposition: A | Discharge status: Home / Self Care | Source: Ambulatory Visit | Attending: Pulmonary Disease | Admitting: Pulmonary Disease

## 2024-11-06 DIAGNOSIS — I2729 Other secondary pulmonary hypertension: Secondary | ICD-10-CM | POA: Insufficient documentation

## 2024-11-06 NOTE — Telephone Encounter (Signed)
 Called to confirm/remind patient of their appointment at the [facility] on 11/07/24.   Appointment:   [x] Confirmed  [] Left mess   [] No answer/No voice mail  [] VM Full/unable to leave message  [] Phone not in service  Patient reminded to bring all medications and/or complete list.  Confirmed patient has transportation. Gave directions, instructed to utilize valet parking.

## 2024-11-06 NOTE — Progress Notes (Signed)
 Daily Session Note  Patient Details  Name: Delaney Perona MRN: [id number] Date of Birth: [birth year] Referring Provider:   Conrad Ports Pulmonary Rehab Walk Test from 08/22/2024 in [facility] for [facility], Vascular, & Lung [facility]  Referring Provider [name]    Encounter Date: 11/06/2024  Check In:  Session Check In - 11/06/24 1031       Check-In   Supervising physician immediately available to respond to emergencies [facility] MD immediately available    Physician(s) Damien Braver, NP    Location [facility]    Staff Present Ronal Levin, RN, BSN;Randi Midge BS, ACSM-CEP, Exercise Physiologist;Cecilia Vancleve Claudene, RT    Virtual Visit No    Medication changes reported     No    Fall or balance concerns reported    No    Tobacco Cessation No Change    Warm-up and Cool-down Performed as group-led instruction    Resistance Training Performed Yes    VAD Patient? No    PAD/SET Patient? No      Pain Assessment   Currently in Pain? No/denies          Capillary Blood Glucose: No results found for this or any previous visit (from the past 24 hours).    Tobacco Use History[1]  Goals Met:  Proper associated with RPD/PD & O2 Sat Independence with exercise equipment Exercise tolerated well No report of concerns or symptoms today Strength training completed today  Goals Unmet:  Not Applicable  Comments: Service time is from 1009 to 1140.    Dr. Slater [name] is Medical Director for Pulmonary Rehab at [facility].     [1]  Social History Tobacco Use  Smoking Status Never  Smokeless Tobacco Never

## 2024-11-07 ENCOUNTER — Other Ambulatory Visit (HOSPITAL_COMMUNITY): Payer: Self-pay

## 2024-11-07 ENCOUNTER — Ambulatory Visit (HOSPITAL_COMMUNITY): Payer: Self-pay | Admitting: Family Medicine

## 2024-11-07 ENCOUNTER — Ambulatory Visit (HOSPITAL_COMMUNITY)
Admission: RE | Admit: 2024-11-07 | Discharge: 2024-11-07 | Disposition: A | Source: Ambulatory Visit | Attending: Family Medicine

## 2024-11-07 ENCOUNTER — Encounter (HOSPITAL_COMMUNITY): Payer: Self-pay

## 2024-11-07 VITALS — BP 118/78 | HR 85 | Ht 60.0 in | Wt 216.4 lb

## 2024-11-07 DIAGNOSIS — K219 Gastro-esophageal reflux disease without esophagitis: Secondary | ICD-10-CM | POA: Diagnosis not present

## 2024-11-07 DIAGNOSIS — Z79899 Other long term (current) drug therapy: Secondary | ICD-10-CM | POA: Insufficient documentation

## 2024-11-07 DIAGNOSIS — Z7901 Long term (current) use of anticoagulants: Secondary | ICD-10-CM | POA: Diagnosis not present

## 2024-11-07 DIAGNOSIS — Z8249 Family history of ischemic heart disease and other diseases of the circulatory system: Secondary | ICD-10-CM | POA: Diagnosis not present

## 2024-11-07 DIAGNOSIS — Z86718 Personal history of other venous thrombosis and embolism: Secondary | ICD-10-CM | POA: Insufficient documentation

## 2024-11-07 DIAGNOSIS — Z7984 Long term (current) use of oral hypoglycemic drugs: Secondary | ICD-10-CM | POA: Diagnosis not present

## 2024-11-07 DIAGNOSIS — N183 Chronic kidney disease, stage 3 unspecified: Secondary | ICD-10-CM | POA: Insufficient documentation

## 2024-11-07 DIAGNOSIS — I272 Pulmonary hypertension, unspecified: Secondary | ICD-10-CM

## 2024-11-07 DIAGNOSIS — Z6841 Body Mass Index (BMI) 40.0 and over, adult: Secondary | ICD-10-CM | POA: Diagnosis not present

## 2024-11-07 DIAGNOSIS — I5032 Chronic diastolic (congestive) heart failure: Secondary | ICD-10-CM | POA: Diagnosis not present

## 2024-11-07 DIAGNOSIS — G4733 Obstructive sleep apnea (adult) (pediatric): Secondary | ICD-10-CM

## 2024-11-07 DIAGNOSIS — M1710 Unilateral primary osteoarthritis, unspecified knee: Secondary | ICD-10-CM | POA: Insufficient documentation

## 2024-11-07 DIAGNOSIS — E662 Morbid (severe) obesity with alveolar hypoventilation: Secondary | ICD-10-CM | POA: Diagnosis not present

## 2024-11-07 DIAGNOSIS — Z9981 Dependence on supplemental oxygen: Secondary | ICD-10-CM | POA: Diagnosis not present

## 2024-11-07 DIAGNOSIS — I130 Hypertensive heart and chronic kidney disease with heart failure and stage 1 through stage 4 chronic kidney disease, or unspecified chronic kidney disease: Secondary | ICD-10-CM | POA: Insufficient documentation

## 2024-11-07 DIAGNOSIS — I10 Essential (primary) hypertension: Secondary | ICD-10-CM | POA: Diagnosis not present

## 2024-11-07 DIAGNOSIS — Z7951 Long term (current) use of inhaled steroids: Secondary | ICD-10-CM | POA: Insufficient documentation

## 2024-11-07 LAB — BASIC METABOLIC PANEL WITH GFR
Anion gap: 13 (ref 5–15)
BUN: 31 mg/dL — ABNORMAL HIGH (ref 6–20)
CO2: 23 mmol/L (ref 22–32)
Calcium: 9.7 mg/dL (ref 8.9–10.3)
Chloride: 104 mmol/L (ref 98–111)
Creatinine, Ser: 2.19 mg/dL — ABNORMAL HIGH (ref 0.44–1.00)
GFR, Estimated: 25 mL/min — ABNORMAL LOW
Glucose, Bld: 95 mg/dL (ref 70–99)
Potassium: 3.6 mmol/L (ref 3.5–5.1)
Sodium: 140 mmol/L (ref 135–145)

## 2024-11-07 LAB — PRO BRAIN NATRIURETIC PEPTIDE: Pro Brain Natriuretic Peptide: 1496 pg/mL — ABNORMAL HIGH

## 2024-11-07 MED ORDER — LOSARTAN POTASSIUM 25 MG PO TABS: 25.0000 mg | ORAL_TABLET | Freq: Every day | ORAL | 3 refills | Status: AC

## 2024-11-07 NOTE — Patient Instructions (Addendum)
 Thank you for coming in today  If you had labs drawn today, any labs that are abnormal the clinic will call you No news is good news  EKG was done today  Medications: No changes  Follow up appointments:  Your physician recommends that you schedule a follow-up appointment in:  2 months With Dr. Shirlee Latch    Do the following things EVERYDAY: Weigh yourself in the morning before breakfast. Write it down and keep it in a log. Take your medicines as prescribed Eat low salt foods--Limit salt (sodium) to 2000 mg per day.  Stay as active as you can everyday Limit all fluids for the day to less than 2 liters   At the [facility], you and your health needs are our priority. As part of our continuing mission to provide you with exceptional heart care, we have created designated Provider Care Teams. These Care Teams include your primary Cardiologist (physician) and Advanced Practice Providers (APPs- Physician Assistants and Nurse Practitioners) who all work together to provide you with the care you need, when you need it.   You may see any of the following providers on your designated Care Team at your next follow up: Dr Arvilla Meres Dr Marca Ancona Dr. Marcos Eke, NP Robbie Lis, [place] [facility] [place], [place] Brynda Peon, NP Karle Plumber, PharmD   Please be sure to bring in all your medications bottles to every appointment.    Thank you for choosing [facility]  If you have any questions or concerns before your next appointment please send [place] a message through [place] or call our office at [phone].    TO LEAVE A MESSAGE FOR THE NURSE SELECT OPTION 2, PLEASE LEAVE A MESSAGE INCLUDING: YOUR NAME DATE OF BIRTH CALL BACK NUMBER REASON FOR CALL**this is important as we prioritize the call backs  YOU WILL RECEIVE A CALL BACK THE SAME DAY AS LONG AS YOU CALL BEFORE 4:00 PM

## 2024-11-07 NOTE — Progress Notes (Signed)
 H&V Care Navigation CSW Progress Note  Pt requested to speak with CSW regarding concerns with utilities.  Brought form from [employer] for physician certification that patient relies on electricity for medical equipment- CSW assisted in having physician fill out- returned to patient to complete patient portion and return to [employer].  Dawn HILARIO Leech, LCSW Clinical Social Worker [facility] Desk#: [phone] Cell#: [phone]

## 2024-11-11 ENCOUNTER — Encounter (HOSPITAL_COMMUNITY)
Admission: RE | Admit: 2024-11-11 | Discharge: 2024-11-11 | Disposition: A | Source: Ambulatory Visit | Attending: Pulmonary Disease

## 2024-11-11 DIAGNOSIS — I2729 Other secondary pulmonary hypertension: Secondary | ICD-10-CM

## 2024-11-11 LAB — GLUCOSE, CAPILLARY: Glucose-Capillary: 113 mg/dL — ABNORMAL HIGH (ref 70–99)

## 2024-11-11 NOTE — Progress Notes (Signed)
 Daily Session Note  Patient Details  Name: Dawn Fowler MRN: [id number] Date of Birth: Sep 23, 1966 Referring Provider:   Conrad Ports Pulmonary Rehab Walk Test from 08/22/2024 in [facility] for Heart, Vascular, & Lung [facility]  Referring Provider [name]    Encounter Date: 11/11/2024  Check In:  Session Check In - 11/11/24 1030       Check-In   Supervising physician immediately available to respond to emergencies [facility] MD immediately available    Physician(s) Rosaline Skains, NP    [facility]    Staff Present Ronal Levin, RN, BSN;Randi Midge HECKLE, ACSM-CEP, Exercise Physiologist;Casey Claudene Adell Music, RN, BSN    Virtual Visit No    Medication changes reported     No    Fall or balance concerns reported    Yes    Comments pt fell on 11/06/24 at home    Tobacco Cessation No Change    Warm-up and Cool-down Performed as group-led instruction    Resistance Training Performed No    VAD Patient? No    PAD/SET Patient? No      Pain Assessment   Currently in Pain? No/denies    Multiple Pain Sites No          Capillary Blood Glucose: Results for orders placed or performed during the hospital encounter of 11/11/24 (from the past 24 hours)  Glucose, capillary     Status: Abnormal   Collection Time: 11/11/24 11:06 AM  Result Value Ref Range   Glucose-Capillary 113 (H) 70 - 99 mg/dL      Tobacco Use Ypdunmb[8]  Goals Met:  Changing diet to healthy choices, watching portion sizes Queuing for purse lip breathing  Goals Unmet:  Pt c/o not feeling well. VS WNL. Pt stated she was tired. Encouraged to drink water  and rest today. Pt sent home after exercising on the first station.   Comments: Service time is from 1017 to 1100    Dr. Slater [name] is Medical Director for Pulmonary Rehab at [facility].     [1]  Social History Tobacco Use  Smoking Status Never  Smokeless Tobacco Never

## 2024-11-13 ENCOUNTER — Telehealth (HOSPITAL_COMMUNITY): Payer: Self-pay

## 2024-11-13 ENCOUNTER — Encounter (HOSPITAL_COMMUNITY)

## 2024-11-13 NOTE — Telephone Encounter (Signed)
 Patient c/o for 10:15 PR class due to car trouble.

## 2024-11-16 ENCOUNTER — Other Ambulatory Visit (HOSPITAL_COMMUNITY): Payer: Self-pay | Admitting: Cardiology

## 2024-11-17 ENCOUNTER — Other Ambulatory Visit (HOSPITAL_COMMUNITY): Payer: Self-pay

## 2024-11-17 DIAGNOSIS — I5032 Chronic diastolic (congestive) heart failure: Secondary | ICD-10-CM

## 2024-11-17 MED ORDER — POTASSIUM CHLORIDE CRYS ER 20 MEQ PO TBCR: 20.0000 meq | EXTENDED_RELEASE_TABLET | Freq: Every day | ORAL | 3 refills | Status: DC

## 2024-11-17 MED ORDER — POTASSIUM CHLORIDE CRYS ER 20 MEQ PO TBCR: 20.0000 meq | EXTENDED_RELEASE_TABLET | Freq: Every day | ORAL | 3 refills | Status: AC

## 2024-11-18 ENCOUNTER — Encounter (HOSPITAL_COMMUNITY)
Admission: RE | Admit: 2024-11-18 | Discharge: 2024-11-18 | Disposition: A | Source: Ambulatory Visit | Attending: Pulmonary Disease

## 2024-11-18 VITALS — Wt 215.6 lb

## 2024-11-18 DIAGNOSIS — I2729 Other secondary pulmonary hypertension: Secondary | ICD-10-CM

## 2024-11-18 NOTE — Progress Notes (Signed)
 Daily Session Note  Patient Details  Name: Dawn Fowler MRN: [id number] Date of Birth: 1966/02/25 Referring Provider:   Conrad Ports Pulmonary Rehab Walk Test from 08/22/2024 in [facility] for Heart, Vascular, & Lung Health  Referring Provider [name]    Encounter Date: 11/18/2024  Check In:  Session Check In - 11/18/24 1105       Check-In   Supervising physician immediately available to respond to emergencies [facility] MD immediately available    Physician(s) Lum Louis, NP    [facility]    Staff Present Ronal Levin, RN, BSN;Kylian Loh Midge BS, ACSM-CEP, Exercise Physiologist;Casey Claudene Neita Moats, MS, ACSM-CEP, Exercise Physiologist    Virtual Visit No    Medication changes reported     No    Fall or balance concerns reported    Yes    Comments pt fell on 11/06/24 at home    Tobacco Cessation No Change    Warm-up and Cool-down Performed as group-led instruction    Resistance Training Performed No    VAD Patient? No    PAD/SET Patient? No      Pain Assessment   Currently in Pain? No/denies    Multiple Pain Sites No          Capillary Blood Glucose: No results found for this or any previous visit (from the past 24 hours).   Exercise Prescription Changes - 11/18/24 1100       Response to Exercise   Blood Pressure (Admit) 100/64    Blood Pressure (Exercise) 128/76    Blood Pressure (Exit) 118/76    Heart Rate (Admit) 97 bpm    Heart Rate (Exercise) 115 bpm    Heart Rate (Exit) 83 bpm    Oxygen Saturation (Admit) 87 %    Oxygen Saturation (Exercise) 94 %    Oxygen Saturation (Exit) 90 %    Rating of Perceived Exertion (Exercise) 12    Perceived Dyspnea (Exercise) 3    Duration Continue with 30 min of aerobic exercise without signs/symptoms of physical distress.    Intensity THRR unchanged      Progression   Progression Continue to progress workloads to maintain intensity without signs/symptoms of physical  distress.      Resistance Training   Weight red bands    Reps 10-15    Time 10 Minutes      Oxygen   Oxygen Continuous    Liters 15 L/ NRB      Recumbant Bike   Level 3    RPM 53    Watts 19    Minutes 15    METs 2.2      NuStep   Level 2    SPM 75    Minutes 15    METs 1.9      Oxygen   Maintain Oxygen Saturation 88% or higher          Tobacco Use History[1]  Goals Met:  Independence with exercise equipment Exercise tolerated well No report of concerns or symptoms today Strength training completed today  Goals Unmet:  Not Applicable  Comments: Service time is from 1021 to 1129.    Dr. Slater [name] is Medical Director for Pulmonary Rehab at [facility].     [1]  Social History Tobacco Use  Smoking Status Never  Smokeless Tobacco Never

## 2024-11-19 ENCOUNTER — Other Ambulatory Visit: Payer: Self-pay | Admitting: Family Medicine

## 2024-11-19 DIAGNOSIS — N950 Postmenopausal bleeding: Secondary | ICD-10-CM

## 2024-11-19 NOTE — Progress Notes (Signed)
 Pulmonary Individual Treatment Plan  Patient Details  Name: Dawn Fowler MRN: [id number] Date of Birth: 01-Aug-1966 Referring Provider:   Conrad Ports Pulmonary Rehab Walk Test from 08/22/2024 in [facility]  Referring Provider [name]    Initial Encounter Date:  Flowsheet Row Pulmonary Rehab Walk Test from 08/22/2024 in [facility] for [facility]  Date 08/22/24    Visit Diagnosis: Other secondary pulmonary hypertension (HCC)  Patient's Home Medications on Admission:  Current Medications[1]  Past Medical History: Past Medical History:  Diagnosis Date   Acute respiratory failure due to COVID-19 [facility]) 10/30/2019   Anxiety    Arthritis    CHF (congestive heart failure) (HCC)    CKD (chronic kidney disease), stage III (HCC)    Complication of anesthesia    hard to wake up after general anesthesia - had trouble waking up after succinylcholine (consider possibility of pseudocholinesterase deficiency)   COVID-19 virus detected 11/20/2019   Tested positive for Covid in December/2020 Required hospitalization in December/2020 through January/2021 Received Redemsivir   Depression    DVT (deep venous thrombosis) (HCC) 06/09/2020   RLE   Dyspnea    Family history of adverse reaction to anesthesia    hard to wake up after general anesthesia   GERD (gastroesophageal reflux disease)    HLD (hyperlipidemia)    HTN (hypertension)    Pre-diabetes    no meds   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; 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, although unsure if she had confirmation testing done   Pulmonary HTN (HCC)    Sleep apnea    on CPAP   Trichomonas infection     Tobacco Use: Tobacco Use History[2]  Labs: Review Flowsheet  More data may exist      Latest Ref Rng & Units 10/29/2019 12/18/2019 07/12/2020 03/18/2024 07/18/2024   Labs for ITP Cardiac and Pulmonary Rehab  Trlycerides <150 mg/dL 862  - - - -  Hemoglobin A1c 4.8 - 5.6 % - - - 5.3  -  Bicarbonate 20.0 - 28.0 mmol/L 20.0 - 28.0 mmol/L - 23.8  24.8  26.9  25.6  26.4  25.6  28.2  26.7  25.1  26.4  - 21.8  22.4   TCO2 22 - 32 mmol/L 22 - 32 mmol/L - 25  26  28  27  28  27  30  28  26  28  24   - 23  24   Acid-base deficit 0.0 - 2.0 mmol/L 0.0 - 2.0 mmol/L - 3.0  2.0  1.0  - 4.0  4.0   O2 Saturation % % - 61.0  63.0  65.0  65.0  66.0  66.0  70.0  61.0  75.0  72.0  - 69  68     Details       Multiple values from one day are sorted in reverse-chronological order         Capillary Blood Glucose: Lab Results  Component Value Date   GLUCAP 113 (H) 11/11/2024   GLUCAP 83 09/18/2024   GLUCAP 97 09/17/2024   GLUCAP 94 09/16/2024   GLUCAP 86 07/18/2024     Pulmonary Assessment Scores:  Pulmonary Assessment Scores     Row Name 08/22/24 1220         ADL UCSD   ADL Phase Entry     SOB Score total 62  CAT Score   CAT Score 20       mMRC Score   mMRC Score 4       UCSD: Self-administered rating of dyspnea associated with activities of daily living (ADLs) 6-point scale (0 = not at all to 5 = maximal or unable to do because of breathlessness)  Scoring Scores range from 0 to 120.  Minimally important difference is 5 units  CAT: CAT can identify the health impairment of COPD patients and is better correlated with disease progression.  CAT has a scoring range of zero to 40. The CAT score is classified into four groups of low (less than 10), medium (10 - 20), high (21-30) and very high (31-40) based on the impact level of disease on health status. A CAT score over 10 suggests significant symptoms.  A worsening CAT score could be explained by an exacerbation, poor medication adherence, poor inhaler technique, or progression of COPD or comorbid conditions.  CAT MCID is 2 points  mMRC: mMRC (Modified Medical Research Council) Dyspnea  Scale is used to assess the degree of baseline functional disability in patients of respiratory disease due to dyspnea. No minimal important difference is established. A decrease in score of 1 point or greater is considered a positive change.   Pulmonary Function Assessment:  Pulmonary Function Assessment - 08/22/24 1116       Breath   Bilateral Breath Sounds Decreased;Clear    Shortness of Breath Yes;Fear of Shortness of Breath;Limiting activity;Panic with Shortness of Breath          Exercise Target Goals: Exercise Program Goal: Individual exercise prescription set using results from initial 6 min walk test and THRR while considering  patients activity barriers and safety.   Exercise Prescription Goal: Initial exercise prescription builds to 30-45 minutes a day of aerobic activity, 2-3 days per week.  Home exercise guidelines will be given to patient during program as part of exercise prescription that the participant will acknowledge.  Activity Barriers & Risk Stratification:  Activity Barriers & Cardiac Risk Stratification - 08/22/24 1111       Activity Barriers & Cardiac Risk Stratification   Activity Barriers Deconditioning;Muscular Weakness;Shortness of Breath;Arthritis;History of Falls    Cardiac Risk Stratification Moderate          6 Minute Walk:  6 Minute Walk     Row Name 08/22/24 1214         6 Minute Walk   Phase Initial     Distance 421 feet     Walk Time 6 minutes     # of Rest Breaks 2  1:49-2:57; 4:24-5:20     MPH 0.8     METS 1.45     RPE 11     Perceived Dyspnea  1     VO2 Peak 5.09     Symptoms Yes (comment)     Comments right knee pain     Resting HR 82 bpm     Resting BP 134/88     Resting Oxygen Saturation  91 %     Exercise Oxygen Saturation  during 6 min walk 85 %     Max Ex. HR 121 bpm     Max Ex. BP 136/80     2 Minute Post BP 124/80       Interval HR   1 Minute HR 112     2 Minute HR 114     3 Minute HR 103     4 Minute HR  121     5 Minute HR 99     6 Minute HR 113     2 Minute Post HR 88     Interval Heart Rate? Yes       Interval Oxygen   Interval Oxygen? Yes     Baseline Oxygen Saturation % 91 %     1 Minute Oxygen Saturation % 93 %     1 Minute Liters of Oxygen 6 L     2 Minute Oxygen Saturation % 85 %     2 Minute Liters of Oxygen 6 L  increased to 8L     3 Minute Oxygen Saturation % 93 %     3 Minute Liters of Oxygen 8 L     4 Minute Oxygen Saturation % 86 %     4 Minute Liters of Oxygen 8 L  increased to 10L     5 Minute Oxygen Saturation % 88 %     5 Minute Liters of Oxygen 10 L     6 Minute Oxygen Saturation % 92 %     6 Minute Liters of Oxygen 10 L     2 Minute Post Oxygen Saturation % 97 %     2 Minute Post Liters of Oxygen 10 L        Oxygen Initial Assessment:  Oxygen Initial Assessment - 08/22/24 1114       Home Oxygen   Home Oxygen Device Home Concentrator;E-Tanks    Sleep Oxygen Prescription Continuous;CPAP    Liters per minute 4    Home Exercise Oxygen Prescription Continuous    Liters per minute 6    Home Resting Oxygen Prescription Continuous    Liters per minute 4    Compliance with Home Oxygen Use Yes      Initial 6 min Walk   Oxygen Used Continuous    Liters per minute 10      Program Oxygen Prescription   Program Oxygen Prescription Continuous    Liters per minute 10      Intervention   Short Term Goals To learn and exhibit compliance with exercise, home and travel O2 prescription;To learn and understand importance of maintaining oxygen saturations>88%;To learn and demonstrate proper use of respiratory medications;To learn and understand importance of monitoring SPO2 with pulse oximeter and demonstrate accurate use of the pulse oximeter.;To learn and demonstrate proper pursed lip breathing techniques or other breathing techniques.     Long  Term Goals Exhibits compliance with exercise, home  and travel O2 prescription;Maintenance of O2 saturations>88%;Compliance  with respiratory medication;Verbalizes importance of monitoring SPO2 with pulse oximeter and return demonstration;Exhibits proper breathing techniques, such as pursed lip breathing or other method taught during program session;Demonstrates proper use of MDIs          Oxygen Re-Evaluation:  Oxygen Re-Evaluation     Row Name 08/22/24 1114 09/19/24 1024 10/10/24 0926 11/11/24 1618       Program Oxygen Prescription   Program Oxygen Prescription -- Continuous Continuous Continuous    Liters per minute -- 15 15 15     Comments -- NRB NRB NRB      Home Oxygen   Home Oxygen Device -- Home Concentrator;E-Tanks Home Concentrator;E-Tanks Home Concentrator;E-Tanks    Sleep Oxygen Prescription -- Continuous;CPAP Continuous;CPAP Continuous;CPAP    Liters per minute -- 4 4 4     Home Exercise Oxygen Prescription -- Continuous Continuous Continuous    Liters per minute -- 6 6 6     Home Resting  Oxygen Prescription -- Continuous Continuous Continuous    Liters per minute -- 4 4 4     Compliance with Home Oxygen Use -- Yes Yes Yes      Goals/Expected Outcomes   Short Term Goals -- To learn and exhibit compliance with exercise, home and travel O2 prescription;To learn and understand importance of maintaining oxygen saturations>88%;To learn and demonstrate proper use of respiratory medications;To learn and understand importance of monitoring SPO2 with pulse oximeter and demonstrate accurate use of the pulse oximeter.;To learn and demonstrate proper pursed lip breathing techniques or other breathing techniques.  To learn and exhibit compliance with exercise, home and travel O2 prescription;To learn and understand importance of maintaining oxygen saturations>88%;To learn and demonstrate proper use of respiratory medications;To learn and understand importance of monitoring SPO2 with pulse oximeter and demonstrate accurate use of the pulse oximeter.;To learn and demonstrate proper pursed lip breathing techniques or  other breathing techniques.  To learn and exhibit compliance with exercise, home and travel O2 prescription;To learn and understand importance of maintaining oxygen saturations>88%;To learn and demonstrate proper use of respiratory medications;To learn and understand importance of monitoring SPO2 with pulse oximeter and demonstrate accurate use of the pulse oximeter.;To learn and demonstrate proper pursed lip breathing techniques or other breathing techniques.     Long  Term Goals -- Exhibits compliance with exercise, home  and travel O2 prescription;Maintenance of O2 saturations>88%;Compliance with respiratory medication;Verbalizes importance of monitoring SPO2 with pulse oximeter and return demonstration;Exhibits proper breathing techniques, such as pursed lip breathing or other method taught during program session;Demonstrates proper use of MDIs Exhibits compliance with exercise, home  and travel O2 prescription;Maintenance of O2 saturations>88%;Compliance with respiratory medication;Verbalizes importance of monitoring SPO2 with pulse oximeter and return demonstration;Exhibits proper breathing techniques, such as pursed lip breathing or other method taught during program session;Demonstrates proper use of MDIs Exhibits compliance with exercise, home  and travel O2 prescription;Maintenance of O2 saturations>88%;Compliance with respiratory medication;Verbalizes importance of monitoring SPO2 with pulse oximeter and return demonstration;Exhibits proper breathing techniques, such as pursed lip breathing or other method taught during program session;Demonstrates proper use of MDIs    Goals/Expected Outcomes Compliance and understanding of oxygen saturation monitoring and breathing techniques to decrease shortness of breath. Compliance and understanding of oxygen saturation monitoring and breathing techniques to decrease shortness of breath. Compliance and understanding of oxygen saturation monitoring and breathing  techniques to decrease shortness of breath. Compliance and understanding of oxygen saturation monitoring and breathing techniques to decrease shortness of breath.       Oxygen Discharge (Final Oxygen Re-Evaluation):  Oxygen Re-Evaluation - 11/11/24 1618       Program Oxygen Prescription   Program Oxygen Prescription Continuous    Liters per minute 15    Comments NRB      Home Oxygen   Home Oxygen Device Home Concentrator;E-Tanks    Sleep Oxygen Prescription Continuous;CPAP    Liters per minute 4    Home Exercise Oxygen Prescription Continuous    Liters per minute 6    Home Resting Oxygen Prescription Continuous    Liters per minute 4    Compliance with Home Oxygen Use Yes      Goals/Expected Outcomes   Short Term Goals To learn and exhibit compliance with exercise, home and travel O2 prescription;To learn and understand importance of maintaining oxygen saturations>88%;To learn and demonstrate proper use of respiratory medications;To learn and understand importance of monitoring SPO2 with pulse oximeter and demonstrate accurate use of the pulse oximeter.;To learn and demonstrate  proper pursed lip breathing techniques or other breathing techniques.     Long  Term Goals Exhibits compliance with exercise, home  and travel O2 prescription;Maintenance of O2 saturations>88%;Compliance with respiratory medication;Verbalizes importance of monitoring SPO2 with pulse oximeter and return demonstration;Exhibits proper breathing techniques, such as pursed lip breathing or other method taught during program session;Demonstrates proper use of MDIs    Goals/Expected Outcomes Compliance and understanding of oxygen saturation monitoring and breathing techniques to decrease shortness of breath.          Initial Exercise Prescription:  Initial Exercise Prescription - 08/22/24 1100       Date of Initial Exercise RX and Referring Provider   Date 08/22/24    Referring Provider [name]    Expected  Discharge Date 11/27/24      Oxygen   Oxygen Continuous    Liters 10    Maintain Oxygen Saturation 88% or higher      Recumbant Bike   Level 1    RPM 50    Watts 80    Minutes 15    METs 1.8      NuStep   Level 1    SPM 60    Minutes 15    METs 2      Prescription Details   Frequency (times per week) 2    Duration Progress to 30 minutes of continuous aerobic without signs/symptoms of physical distress      Intensity   THRR 40-80% of Max Heartrate 65-130    Ratings of Perceived Exertion 11-13    Perceived Dyspnea 0-4      Progression   Progression Continue progressive overload as per policy without signs/symptoms or physical distress.      Resistance Training   Training Prescription Yes    Weight red bands    Reps 10-15          Perform Capillary Blood Glucose checks as needed.  Exercise Prescription Changes:   Exercise Prescription Changes     Row Name 08/26/24 1200 09/09/24 1200 09/23/24 1200 10/02/24 1512 10/21/24 1100     Response to Exercise   Blood Pressure (Admit) 110/70 124/68 130/62 108/70 108/62   Blood Pressure (Exercise) 142/70 128/72 122/66 -- 142/86   Blood Pressure (Exit) 120/80 120/70 112/72 90/50 98/54    Heart Rate (Admit) 89 bpm 85 bpm 106 bpm 90 bpm 78 bpm   Heart Rate (Exercise) 109 bpm 107 bpm 109 bpm 114 bpm 106 bpm   Heart Rate (Exit) 85 bpm 83 bpm 98 bpm 88 bpm 90 bpm   Oxygen Saturation (Admit) 92 % 87 % 93 % 95 % 90 %   Oxygen Saturation (Exercise) 93 % 91 % 95 % 93 % 97 %   Oxygen Saturation (Exit) 97 % 94 % 94 % 96 % 93 %   Rating of Perceived Exertion (Exercise) 13 15 13 15 13    Perceived Dyspnea (Exercise) 1 2 2 2 1    Duration Continue with 30 min of aerobic exercise without signs/symptoms of physical distress. Continue with 30 min of aerobic exercise without signs/symptoms of physical distress. Continue with 30 min of aerobic exercise without signs/symptoms of physical distress. Continue with 30 min of aerobic exercise without  signs/symptoms of physical distress. Continue with 30 min of aerobic exercise without signs/symptoms of physical distress.   Intensity THRR unchanged THRR unchanged THRR unchanged THRR unchanged THRR unchanged     Progression   Progression Continue to progress workloads to maintain intensity without signs/symptoms of physical  distress. Continue to progress workloads to maintain intensity without signs/symptoms of physical distress. Continue to progress workloads to maintain intensity without signs/symptoms of physical distress. Continue to progress workloads to maintain intensity without signs/symptoms of physical distress. Continue to progress workloads to maintain intensity without signs/symptoms of physical distress.     Resistance Training   Training Prescription Yes Yes Yes Yes --   Weight red bands red bands red bands red bands red bands   Reps 10-15 10-15 10-15 10-15 10-15   Time 10 Minutes 10 Minutes 10 Minutes 10 Minutes 10 Minutes     Oxygen   Oxygen Continuous Continuous Continuous -- Continuous   Liters 10 15 10  -- 15 L/ NRB     Recumbant Bike   Level 1 1 1 1 2    RPM 50 -- 46 -- 53   Watts 12 -- 10 -- 14   Minutes 15 15 15 15 15    METs 1.9 1.9 1.8 1.9 1.9     NuStep   Level 1 1 1 1 2    SPM 76 -- 74 79 70   Minutes 15 -- 15 15 15    METs 1.9 1.7 1.6 1.7 1.7     Oxygen   Maintain Oxygen Saturation 88% or higher 88% or higher 88% or higher --  90 % or higher 88% or higher    Row Name 10/28/24 0913 11/18/24 1100           Response to Exercise   Blood Pressure (Admit) 126/70 100/64      Blood Pressure (Exercise) -- 128/76      Blood Pressure (Exit) 102/72 118/76      Heart Rate (Admit) 96 bpm 97 bpm      Heart Rate (Exercise) 104 bpm 115 bpm      Heart Rate (Exit) 77 bpm 83 bpm      Oxygen Saturation (Admit) 93 % 87 %      Oxygen Saturation (Exercise) 93 % 94 %      Oxygen Saturation (Exit) 95 % 90 %      Rating of Perceived Exertion (Exercise) 12 12       Perceived Dyspnea (Exercise) 2 3      Duration Continue with 30 min of aerobic exercise without signs/symptoms of physical distress. Continue with 30 min of aerobic exercise without signs/symptoms of physical distress.      Intensity THRR unchanged THRR unchanged        Progression   Progression Continue to progress workloads to maintain intensity without signs/symptoms of physical distress. Continue to progress workloads to maintain intensity without signs/symptoms of physical distress.        Resistance Training   Weight red bands red bands      Reps 10-15 10-15      Time 10 Minutes 10 Minutes        Oxygen   Oxygen Continuous Continuous      Liters 15 L/ NRB 15 L/ NRB        Recumbant Bike   Level 2 3      RPM 51 53      Watts 14 19      Minutes 15 15      METs 1.9 2.2        NuStep   Level 2 2      SPM 68 75      Minutes 15 15      METs 1.6 1.9  Oxygen   Maintain Oxygen Saturation 88% or higher 88% or higher         Exercise Comments:   Exercise Goals and Review:   Exercise Goals     Row Name 08/22/24 1041             Exercise Goals   Increase Physical Activity Yes       Intervention Provide advice, education, support and counseling about physical activity/exercise needs.;Develop an individualized exercise prescription for aerobic and resistive training based on initial evaluation findings, risk stratification, comorbidities and participant's personal goals.       Expected Outcomes Short Term: Attend rehab on a regular basis to increase amount of physical activity.;Long Term: Exercising regularly at least 3-5 days a week.;Long Term: Add in home exercise to make exercise part of routine and to increase amount of physical activity.       Increase Strength and Stamina Yes       Intervention Provide advice, education, support and counseling about physical activity/exercise needs.;Develop an individualized exercise prescription for aerobic and resistive  training based on initial evaluation findings, risk stratification, comorbidities and participant's personal goals.       Expected Outcomes Short Term: Increase workloads from initial exercise prescription for resistance, speed, and METs.;Short Term: Perform resistance training exercises routinely during rehab and add in resistance training at home;Long Term: Improve cardiorespiratory fitness, muscular endurance and strength as measured by increased METs and functional capacity ( )       Able to understand and use rate of perceived exertion (RPE) scale Yes       Intervention Provide education and explanation on how to use RPE scale       Expected Outcomes Short Term: Able to use RPE daily in rehab to express subjective intensity level;Long Term:  Able to use RPE to guide intensity level when exercising independently       Able to understand and use Dyspnea scale Yes       Intervention Provide education and explanation on how to use Dyspnea scale       Expected Outcomes Short Term: Able to use Dyspnea scale daily in rehab to express subjective sense of shortness of breath during exertion;Long Term: Able to use Dyspnea scale to guide intensity level when exercising independently       Knowledge and understanding of Target Heart Rate Range (THRR) Yes       Intervention Provide education and explanation of THRR including how the numbers were predicted and where they are located for reference       Expected Outcomes Short Term: Able to state/look up THRR;Long Term: Able to use THRR to govern intensity when exercising independently;Short Term: Able to use daily as guideline for intensity in rehab       Understanding of Exercise Prescription Yes       Intervention Provide education, explanation, and written materials on patient's individual exercise prescription       Expected Outcomes Short Term: Able to explain program exercise prescription;Long Term: Able to explain home exercise prescription to exercise  independently          Exercise Goals Re-Evaluation :  Exercise Goals Re-Evaluation     Row Name 08/22/24 1327 09/19/24 1022 10/10/24 0923 11/11/24 1617       Exercise Goal Re-Evaluation   Exercise Goals Review Increase Physical Activity;Able to understand and use Dyspnea scale;Understanding of Exercise Prescription;Increase Strength and Stamina;Knowledge and understanding of Target Heart Rate Range (THRR);Able to understand and use rate  of perceived exertion (RPE) scale Increase Physical Activity;Able to understand and use Dyspnea scale;Understanding of Exercise Prescription;Increase Strength and Stamina;Knowledge and understanding of Target Heart Rate Range (THRR);Able to understand and use rate of perceived exertion (RPE) scale Increase Physical Activity;Able to understand and use Dyspnea scale;Understanding of Exercise Prescription;Increase Strength and Stamina;Knowledge and understanding of Target Heart Rate Range (THRR);Able to understand and use rate of perceived exertion (RPE) scale Increase Physical Activity;Able to understand and use Dyspnea scale;Understanding of Exercise Prescription;Increase Strength and Stamina;Knowledge and understanding of Target Heart Rate Range (THRR);Able to understand and use rate of perceived exertion (RPE) scale    Comments Dawn Fowler is scheduled to begin exercise on 10/30. Will continue to monitor and progress as able. Dawn Fowler has completed 5 exercise sessions. She exercises for 15 min on the track and Nsutep. Dawn Fowler averages 1.92 METs on the track and 1.6 METs at level 1 on the Nustep. She performs the warmup and cooldown standing holding onto a rollator for balance. She is currently hospitalized as I am unable to assess progress at this time. Will continue to monitor and progress as able. Dawn Fowler has completed 8 exercise sessions. She exercises for 15 min on the recumbent bike and Nustep. She averages 1.9 METs at level 2 on the recumbent bike and 1.5 METs at level 1 on the  Nustep. She performs the warmup and cooldown standing/ seated dependent on her shortness of breath. She has transitioned from the track to the recumbent bike. This is due to an increase need in supplemental oxygen and patient preference. She tolerates the recumbent bike well. Dawn Fowler has been difficult to progress due to a recent hospitalization. Will continue to monitor and progress as able. Dawn Fowler has completed 14 exercise sessions. She exercises for 15 min on the recumbent bike and Nustep. She averages 1.9 METs at level 2 on the recumbent bike and 1.6 METs at level 1 on the Nustep. She performs the warmup and cooldown standing/ seated dependent on her shortness of breath. Her level on the recumbent bike and Nustep have remained the same. She has been dificult to progress due to deconditioning. Will continue to monitor and progress as able.    Expected Outcomes Through exercise at rehab and home, the patient will decrease shortness of breath with daily activities and feel confident in carrying out an exercise regimen at home. Through exercise at rehab and home, the patient will decrease shortness of breath with daily activities and feel confident in carrying out an exercise regimen at home. Through exercise at rehab and home, the patient will decrease shortness of breath with daily activities and feel confident in carrying out an exercise regimen at home. Through exercise at rehab and home, the patient will decrease shortness of breath with daily activities and feel confident in carrying out an exercise regimen at home.       Discharge Exercise Prescription (Final Exercise Prescription Changes):  Exercise Prescription Changes - 11/18/24 1100       Response to Exercise   Blood Pressure (Admit) 100/64    Blood Pressure (Exercise) 128/76    Blood Pressure (Exit) 118/76    Heart Rate (Admit) 97 bpm    Heart Rate (Exercise) 115 bpm    Heart Rate (Exit) 83 bpm    Oxygen Saturation (Admit) 87 %    Oxygen  Saturation (Exercise) 94 %    Oxygen Saturation (Exit) 90 %    Rating of Perceived Exertion (Exercise) 12    Perceived Dyspnea (Exercise) 3  Duration Continue with 30 min of aerobic exercise without signs/symptoms of physical distress.    Intensity THRR unchanged      Progression   Progression Continue to progress workloads to maintain intensity without signs/symptoms of physical distress.      Resistance Training   Weight red bands    Reps 10-15    Time 10 Minutes      Oxygen   Oxygen Continuous    Liters 15 L/ NRB      Recumbant Bike   Level 3    RPM 53    Watts 19    Minutes 15    METs 2.2      NuStep   Level 2    SPM 75    Minutes 15    METs 1.9      Oxygen   Maintain Oxygen Saturation 88% or higher          Nutrition:  Target Goals: Understanding of nutrition guidelines, daily intake of sodium 1500mg , cholesterol 200mg , calories 30% from fat and 7% or less from saturated fats, daily to have 5 or more servings of fruits and vegetables.  Biometrics:  Pre Biometrics - 08/22/24 1211       Pre Biometrics   Grip Strength 8 kg           Nutrition Therapy Plan and Nutrition Goals:  Nutrition Therapy & Goals - 09/09/24 1144       Nutrition Therapy   Diet Regular Diet      Personal Nutrition Goals   Nutrition Goal Patient to identify strategies for weight loss with goal of 0.5-2 # per week of weight loss.    Personal Goal #2 Patient to improve diet quality by using the plate method as a guide for meal planning to include lean protein/plant protein, fruits, vegetables, whole grains, nonfat dairy as part of a well-balanced diet.    Comments Patient with chronic diastolic CHF with significant RV dysfunction, pulmonary hypertension, CKD. Pt reports desire to achieve goal wt of 200 lb by January 2026. Current BMI > 40 kg/m2. Notes improved appetite control since starting tirzepatide . Finding other outlets such as [employer] and crafts to help address  emotional needs. Food insecurity noted, though pt reports support from aunt to help fill gap from food stamps. Pt working to make healthy food choices and increase daily activity.      Intervention Plan   Intervention Prescribe, educate and counsel regarding individualized specific dietary modifications aiming towards targeted core components such as weight, hypertension, lipid management, diabetes, heart failure and other comorbidities.    Expected Outcomes Short Term Goal: Understand basic principles of dietary content, such as calories, fat, sodium, cholesterol and nutrients.;Long Term Goal: Adherence to prescribed nutrition plan.          Nutrition Assessments:  MEDIFICTS Score Key: >=70 Need to make dietary changes  40-70 Heart Healthy Diet <= 40 Therapeutic Level Cholesterol Diet   Picture Your Plate Scores: <59 Unhealthy dietary pattern with much room for improvement. 41-50 Dietary pattern unlikely to meet recommendations for good health and room for improvement. 51-60 More healthful dietary pattern, with some room for improvement.  >60 Healthy dietary pattern, although there may be some specific behaviors that could be improved.    Nutrition Goals Re-Evaluation:  Nutrition Goals Re-Evaluation     Row Name 10/02/24 1137 10/28/24 1045           Goals   Current Weight 216 lb 12.8 oz (98.3 kg)  215 lb 6.4 oz (97.7 kg)      Nutrition Goal Patient to identify strategies for weight loss with goal of 0.5-2 # per week of weight loss. Patient to identify strategies for weight loss with goal of 0.5-2 # per week of weight loss.      Comment (09/18/2024) BUN 43, Cr 2.19, eGFR 26 3.3 lb wt loss over past month      Expected Outcome Goals in action. Patient with chronic diastolic CHF with significant RV dysfunction, pulmonary hypertension, CKD. Pt reports desire to achieve goal wt of 200 lb by January 2026. Weight trending down with 2 lb wt loss noted over past week; may be impacted by  fluid status. Pt notes improved appetite control since starting tirzepatide ; however states intake may consist of 1-2 meals daily. Notes some difficulty reducing portion size based on reduced appetite and increased fullness. RD suggested using smaller plates. Also discussed importance of incorporating 2-3 small meals/snacks daily to maintain adequate caloric intake and protect muscle tissue. Pt reports efforts to limit sodium intake; however occasionally consumes higher sodium foods such as fast food, ramen noodles. Recognizes impact high sodium foods have on fluid retention. RD reviewed sources of high sodium foods and provided suggestions for lower sodium options. Encouraged pt to replace high sodium snacks such as chips with healthier options such as veggies. Goals in action. Patient with chronic diastolic CHF with significant RV dysfunction, pulmonary hypertension, CKD. Pt reports desire to achieve goal wt of 200 lb by January 2026. Weight continues to trend down; appropriate rate of weight loss noted. Pt remains on tirzepatide . Pt endorses understanding regarding benefits of well-balanced diet with focus on nutrient dense foods such as fruit and vegetables. Pt reports incorporating plant-based foods into diet for overall health and weight management.        Personal Goal #2 Re-Evaluation   Personal Goal #2 Patient to improve diet quality by using the plate method as a guide for meal planning to include lean protein/plant protein, fruits, vegetables, whole grains, nonfat dairy as part of a well-balanced diet. Patient to improve diet quality by using the plate method as a guide for meal planning to include lean protein/plant protein, fruits, vegetables, whole grains, nonfat dairy as part of a well-balanced diet.         Nutrition Goals Discharge (Final Nutrition Goals Re-Evaluation):  Nutrition Goals Re-Evaluation - 10/28/24 1045       Goals   Current Weight 215 lb 6.4 oz (97.7 kg)    Nutrition Goal  Patient to identify strategies for weight loss with goal of 0.5-2 # per week of weight loss.    Comment 3.3 lb wt loss over past month    Expected Outcome Goals in action. Patient with chronic diastolic CHF with significant RV dysfunction, pulmonary hypertension, CKD. Pt reports desire to achieve goal wt of 200 lb by January 2026. Weight continues to trend down; appropriate rate of weight loss noted. Pt remains on tirzepatide . Pt endorses understanding regarding benefits of well-balanced diet with focus on nutrient dense foods such as fruit and vegetables. Pt reports incorporating plant-based foods into diet for overall health and weight management.      Personal Goal #2 Re-Evaluation   Personal Goal #2 Patient to improve diet quality by using the plate method as a guide for meal planning to include lean protein/plant protein, fruits, vegetables, whole grains, nonfat dairy as part of a well-balanced diet.          Psychosocial: Target  Goals: Acknowledge presence or absence of significant depression and/or stress, maximize coping skills, provide positive support system. Participant is able to verbalize types and ability to use techniques and skills needed for reducing stress and depression.  Initial Review & Psychosocial Screening:  Initial Psych Review & Screening - 08/22/24 1109       Initial Review   Current issues with Current Depression;Current Psychotropic Meds;Current Stress Concerns    Source of Stress Concerns Chronic Illness;Financial;Unable to participate in former interests or hobbies    Comments Holley is currently on a fixed income with her disability check. She also has car trouble and states the money she receives each month is not enough to fix it.      Family Dynamics   Good Support System? Yes    Comments Pt has support from her aunt and friends      Barriers   Psychosocial barriers to participate in program Psychosocial barriers identified (see note)      Screening  Interventions   Interventions Encouraged to exercise;Provide feedback about the scores to participant;To provide support and resources with identified psychosocial needs    Expected Outcomes Short Term goal: Utilizing psychosocial counselor, staff and physician to assist with identification of specific Stressors or current issues interfering with healing process. Setting desired goal for each stressor or current issue identified.;Long Term Goal: Stressors or current issues are controlled or eliminated.;Long Term goal: The participant improves quality of Life and PHQ9 Scores as seen by post scores and/or verbalization of changes;Short Term goal: Identification and review with participant of any Quality of Life or Depression concerns found by scoring the questionnaire.          Quality of Life Scores:  Scores of 19 and below usually indicate a poorer quality of life in these areas.  A difference of  2-3 points is a clinically meaningful difference.  A difference of 2-3 points in the total score of the Quality of Life Index has been associated with significant improvement in overall quality of life, self-image, physical symptoms, and general health in studies assessing change in quality of life.  PHQ-9: Review Flowsheet       08/22/2024  Depression screen PHQ 2/9  Decreased Interest 1  Down, Depressed, Hopeless 1  PHQ - 2 Score 2  Altered sleeping 2  Tired, decreased energy 1  Change in appetite 1  Feeling bad or failure about yourself  2  Trouble concentrating 1  Moving slowly or fidgety/restless 0  Suicidal thoughts 0  PHQ-9 Score 9   Difficult doing work/chores Somewhat difficult    Details       Data saved with a previous flowsheet row definition        Interpretation of Total Score  Total Score Depression Severity:  1-4 = Minimal depression, 5-9 = Mild depression, 10-14 = Moderate depression, 15-19 = Moderately severe depression, 20-27 = Severe depression   Psychosocial  Evaluation and Intervention:  Psychosocial Evaluation - 08/22/24 1205       Psychosocial Evaluation & Interventions   Interventions Stress management education;Relaxation education;Encouraged to exercise with the program and follow exercise prescription    Comments Initial PHQ-2/PHQ-9 score 2/9. Dawn Fowler states she is dealing with depression and stress. Her health and financial situation are the main contributors to her mental health barriers. She is taking psychotropic meds and just finished up counseling. Staff will provide education and relaxation techniques to help Dawn Fowler deal with her daily stressors.    Expected Outcomes For Dawn Fowler to  have less depression and less stress while participating in the program    Continue Psychosocial Services  Follow up required by staff          Psychosocial Re-Evaluation:  Psychosocial Re-Evaluation     Row Name 08/25/24 0814 09/15/24 1551 09/17/24 0947 10/13/24 1508 11/07/24 1412     Psychosocial Re-Evaluation   Current issues with Current Depression;Current Psychotropic Meds;Current Stress Concerns Current Depression;Current Psychotropic Meds;Current Stress Concerns Current Depression;Current Psychotropic Meds;Current Stress Concerns Current Depression;Current Psychotropic Meds;Current Stress Concerns Current Depression;Current Psychotropic Meds;Current Stress Concerns   Comments Dawn Fowler is scheduled to start PR class on 08/26/24. No new barriers or concerns since orientation. -- Monthly psy/soc re-eval is as follows: Dawn Fowler stills struggle with depression. She is currently in the hospital and has had to miss a week of class. Staff will provide Dawn Fowler with education on ways to reduce stress. No needs at this time. Monthly psy/soc re-eval is as follows: Dawn Fowler has returned to class since her hospital admission and seems to be in better spirits. She still struggles with depression, but feels it is stable at this time. She is compliant with taking her psychotropic meds and has good  support from her family. No needs at this time. Monthly psy/soc re-eval is as follows: Dawn Fowler has returned to class since her hospital admission and seems to be in better spirits. She still struggles with depression, but feels it is stable at this time. She is compliant with taking her psychotropic meds and has good support from her family. No needs at this time.   Expected Outcomes For Dawn Fowler to have less depression and stress while participating in the program. -- For Dawn Fowler to have less depression and stress while participating in the program. For Dawn Fowler to have less depression and stress while participating in the program. --   Interventions Encouraged to attend Pulmonary Rehabilitation for the exercise;Stress management education;Relaxation education -- Encouraged to attend Pulmonary Rehabilitation for the exercise;Stress management education;Relaxation education Encouraged to attend Pulmonary Rehabilitation for the exercise;Stress management education;Relaxation education --   Continue Psychosocial Services  Follow up required by staff -- Follow up required by staff Follow up required by staff --    Row Name 11/10/24 1447             Psychosocial Re-Evaluation   Current issues with Current Depression;Current Psychotropic Meds;Current Stress Concerns       Comments Monthly psy/soc re-eval is as follows: Dawn Fowler denies any new psy/soc barriers or concerns at this time. She still struggles with depression, but feels it is stable at this time. She likes to do crafts in her spare time and this is a good outlet for her to release her stress. She is compliant with taking her psychotropic meds and has good support from her family. No needs at this time.       Expected Outcomes For Dawn Fowler to have less depression and stress while participating in the program.       Interventions Encouraged to attend Pulmonary Rehabilitation for the exercise;Stress management education;Relaxation education       Continue Psychosocial  Services  Follow up required by staff          Psychosocial Discharge (Final Psychosocial Re-Evaluation):  Psychosocial Re-Evaluation - 11/10/24 1447       Psychosocial Re-Evaluation   Current issues with Current Depression;Current Psychotropic Meds;Current Stress Concerns    Comments Monthly psy/soc re-eval is as follows: Dawn Fowler denies any new psy/soc barriers or concerns at this time. She still  struggles with depression, but feels it is stable at this time. She likes to do crafts in her spare time and this is a good outlet for her to release her stress. She is compliant with taking her psychotropic meds and has good support from her family. No needs at this time.    Expected Outcomes For Dawn Fowler to have less depression and stress while participating in the program.    Interventions Encouraged to attend Pulmonary Rehabilitation for the exercise;Stress management education;Relaxation education    Continue Psychosocial Services  Follow up required by staff          Education: Education Goals: Education classes will be provided on a weekly basis, covering required topics. Participant will state understanding/return demonstration of topics presented.  Learning Barriers/Preferences:  Learning Barriers/Preferences - 08/22/24 1211       Learning Barriers/Preferences   Learning Barriers Sight   wears glasses   Learning Preferences None          Education Topics: Know Your Numbers Group instruction that is supported by a PowerPoint presentation. Instructor discusses importance of knowing and understanding resting, exercise, and post-exercise oxygen saturation, heart rate, and blood pressure. Oxygen saturation, heart rate, blood pressure, rating of perceived exertion, and dyspnea are reviewed along with a normal range for these values.  Flowsheet Row PULMONARY REHAB OTHER RESPIRATORY from 10/16/2024 in [facility]  Date 10/16/24   Educator EP  Instruction Review Code 1- Verbalizes Understanding    Exercise for the Pulmonary Patient Group instruction that is supported by a PowerPoint presentation. Instructor discusses benefits of exercise, core components of exercise, frequency, duration, and intensity of an exercise routine, importance of utilizing pulse oximetry during exercise, safety while exercising, and options of places to exercise outside of rehab.  Flowsheet Row PULMONARY REHAB OTHER RESPIRATORY from 10/09/2024 in [facility]  Date 10/09/24  Educator EP  Instruction Review Code 1- Verbalizes Understanding    MET Level  Group instruction provided by PowerPoint, verbal discussion, and written material to support subject matter. Instructor reviews what METs are and how to increase METs.  Flowsheet Row PULMONARY REHAB OTHER RESPIRATORY from 09/04/2024 in [facility], [facility]  Date 09/04/24  Educator EP  Instruction Review Code 1- Verbalizes Understanding    Pulmonary Medications Verbally interactive group education provided by instructor with focus on inhaled medications and proper administration. Flowsheet Row PULMONARY REHAB OTHER RESPIRATORY from 10/02/2024 in [facility] for [facility], V[facility], [facility] Lung [facility]  Date 10/02/24  Educator RT  Instruction Review Code 1- Verbalizes Understanding    Anatomy and Physiology of the Respiratory System Group instruction provided by PowerPoint, verbal discussion, and written material to support subject matter. Instructor reviews respiratory cycle and anatomical components of the respiratory system and their functions. Instructor also reviews differences in obstructive and restrictive respiratory diseases with examples of each.    Oxygen Safety Group instruction provided by PowerPoint, verbal discussion, and written material to support  subject matter. There is an overview of What is Oxygen and Why do we need it.  Instructor also reviews how to create a safe environment for oxygen use, the importance of using oxygen as prescribed, and the risks of noncompliance. There is a brief discussion on traveling with oxygen and resources the patient may utilize. Flowsheet Row PULMONARY REHAB OTHER RESPIRATORY from 11/06/2024 in [facility]  Date 11/06/24  Educator RN  Instruction Review Code 1- Verbalizes Understanding    Oxygen Use Group instruction provided by PowerPoint, verbal discussion, and written material to discuss how supplemental oxygen is prescribed and different types of oxygen supply systems. Resources for more information are provided.    Breathing Techniques Group instruction that is supported by demonstration and informational handouts. Instructor discusses the benefits of pursed lip and diaphragmatic breathing and detailed demonstration on how to perform both.     Risk Factor Reduction Group instruction that is supported by a PowerPoint presentation. Instructor discusses the definition of a risk factor, different risk factors for pulmonary disease, and how the heart and lungs work together. Flowsheet Row PULMONARY REHAB OTHER RESPIRATORY from 08/28/2024 in [facility] for Heart, Vascular, & Lung Health  Date 08/28/24  Educator EP  Instruction Review Code 1- Verbalizes Understanding    Pulmonary Diseases Group instruction provided by PowerPoint, verbal discussion, and written material to support subject matter. Instructor gives an overview of the different type of pulmonary diseases. There is also a discussion on risk factors and symptoms as well as ways to manage the diseases. Flowsheet Row PULMONARY REHAB OTHER RESPIRATORY from 09/11/2024 in [facility], [facility]  Date 09/11/24   Educator RT  Instruction Review Code 1- Verbalizes Understanding    Stress and Energy Conservation Group instruction provided by PowerPoint, verbal discussion, and written material to support subject matter. Instructor gives an overview of stress and the impact it can have on the body. Instructor also reviews ways to reduce stress. There is also a discussion on energy conservation and ways to conserve energy throughout the day.   Warning Signs and Symptoms Group instruction provided by PowerPoint, verbal discussion, and written material to support subject matter. Instructor reviews warning signs and symptoms of stroke, heart attack, cold and flu. Instructor also reviews ways to prevent the spread of infection.   Other Education Group or individual verbal, written, or video instructions that support the educational goals of the pulmonary rehab program.    Knowledge Questionnaire Score:  Knowledge Questionnaire Score - 08/22/24 1053       Knowledge Questionnaire Score   Pre Score 17/18          Core Components/Risk Factors/Patient Goals at Admission:  Personal Goals and Risk Factors at Admission - 08/22/24 1117       Core Components/Risk Factors/Patient Goals on Admission   Improve shortness of breath with ADL's Yes    Intervention Provide education, individualized exercise plan and daily activity instruction to help decrease symptoms of SOB with activities of daily living.    Expected Outcomes Short Term: Improve cardiorespiratory fitness to achieve a reduction of symptoms when performing ADLs;Long Term: Be able to perform more ADLs without symptoms or delay the onset of symptoms    Stress Yes    Intervention Offer individual and/or small group education and counseling on adjustment to heart disease, stress management and health-related lifestyle change. Teach and support self-help strategies.;Refer participants experiencing significant psychosocial distress to appropriate mental  health specialists for further evaluation and treatment. When possible, include family members and significant others in education/counseling sessions.    Expected Outcomes Short Term: Participant demonstrates changes in health-related behavior, relaxation and other stress management skills, ability to obtain effective social support, and compliance with psychotropic medications if prescribed.;Long Term: Emotional wellbeing is indicated by absence of clinically significant  psychosocial distress or social isolation.          Core Components/Risk Factors/Patient Goals Review:   Goals and Risk Factor Review     Row Name 08/25/24 0816 09/17/24 1013 10/13/24 1511 11/10/24 1453       Core Components/Risk Factors/Patient Goals Review   Personal Goals Review Improve shortness of breath with ADL's;Develop more efficient breathing techniques such as purse lipped breathing and diaphragmatic breathing and practicing self-pacing with activity.;Stress Improve shortness of breath with ADL's;Develop more efficient breathing techniques such as purse lipped breathing and diaphragmatic breathing and practicing self-pacing with activity.;Stress Improve shortness of breath with ADL's;Develop more efficient breathing techniques such as purse lipped breathing and diaphragmatic breathing and practicing self-pacing with activity.;Stress Improve shortness of breath with ADL's;Stress    Review Dawn Fowler is scheduled to start the PR program on 08/26/24. Unable to asses her goals at this time. Monthly review of patients Core Components/Risk Factors/Patient Goals are as follows: Goal progressing for improving shortness of breath. Dawn Fowler is currently exercising on 15L- NRB mask to keep sats >90%. She is exercising on the [place] and walking the track. Goal progressing for developing more efficient breathing techniques such as purse lipped breathing and diaphragmatic breathing; and practicing self-pacing with activity. Goal progressing on  reducing stress. We will continue to monitor her progress throughout the program. Monthly review of patients Core Components/Risk Factors/Patient Goals are as follows: Goal progressing for improving shortness of breath. Dawn Fowler is currently exercising on 15L- NRB mask to keep sats >90%. She is exercising on the Nustep and recumbant bike. Goal progressing for developing more efficient breathing techniques such as purse lipped breathing and diaphragmatic breathing; and practicing self-pacing with activity. Goal progressing on reducing stress. We will continue to monitor her progress throughout the program. Monthly review of patients Core Components/Risk Factors/Patient Goals are as follows: Goal progressing for improving shortness of breath. Dawn Fowler is currently exercising on 15L- NRB mask to keep sats >90%. She is exercising on the Nustep and recumbant bike. Goal met for developing more efficient breathing techniques such as purse lipped breathing and diaphragmatic breathing; and practicing self-pacing with activity. Dawn Fowler is able to demonstrate PLB when she becomes SOB. She also know's how to self pace based on her RPE/dyspnea scores. Goal progressing on reducing stress. We will continue to monitor her progress throughout the program.    Expected Outcomes To improve shortness of breath with ADL's, develop more efficient breathing techniques such as purse lipped breathing and diaphragmatic breathing; and practicing self-pacing with activity and have less stress. To improve shortness of breath with ADL's, develop more efficient breathing techniques such as purse lipped breathing and diaphragmatic breathing; and practicing self-pacing with activity and have less stress. To improve shortness of breath with ADL's, develop more efficient breathing techniques such as purse lipped breathing and diaphragmatic breathing; and practicing self-pacing with activity and have less stress. To improve shortness of breath with ADL's and have  less stress.       Core Components/Risk Factors/Patient Goals at Discharge (Final Review):   Goals and Risk Factor Review - 11/10/24 1453       Core Components/Risk Factors/Patient Goals Review   Personal Goals Review Improve shortness of breath with ADL's;Stress    Review Monthly review of patients Core Components/Risk Factors/Patient Goals are as follows: Goal progressing for improving shortness of breath. Dawn Fowler is currently exercising on 15L- NRB mask to keep sats >90%. She is exercising on the Nustep and recumbant bike. Goal met for developing more  efficient breathing techniques such as purse lipped breathing and diaphragmatic breathing; and practicing self-pacing with activity. Dawn Fowler is able to demonstrate PLB when she becomes SOB. She also know's how to self pace based on her RPE/dyspnea scores. Goal progressing on reducing stress. We will continue to monitor her progress throughout the program.    Expected Outcomes To improve shortness of breath with ADL's and have less stress.          ITP Comments:Pt is making expected progress toward Pulmonary Rehab goals after completing 15 session(s). Recommend continued exercise, life style modification, education, and utilization of breathing techniques to increase stamina and strength, while also decreasing shortness of breath with exertion.  Dr. Slater Staff is Medical Director for Pulmonary Rehab at [facility].          [1]  Current Outpatient Medications:    acetaminophen  (TYLENOL ) 500 MG tablet, Take 1,000 mg by mouth every 6 (six) hours as needed for moderate pain or headache., Disp: , Rfl:    albuterol  (VENTOLIN  HFA) 108 (90 Base) MCG/ACT inhaler, Inhale 2 puffs into the lungs every 6 (six) hours as needed for wheezing or shortness of breath., Disp: 8 g, Rfl: 5   allopurinol  (ZYLOPRIM ) 100 MG tablet, TAKE 1 TABLET(100 MG) BY MOUTH DAILY, Disp: 30 tablet, Rfl: 2   ALYQ  20 MG tablet, Take 20 mg by mouth daily., Disp: , Rfl:     amLODipine  (NORVASC ) 5 MG tablet, Take 1 tablet (5 mg total) by mouth daily., Disp: 90 tablet, Rfl: 3   apixaban  (ELIQUIS ) 5 MG TABS tablet, Take 1 tablet (5 mg total) by mouth 2 (two) times daily., Disp: 60 tablet, Rfl: 9   Cholecalciferol  (VITAMIN D3) 50 MCG (2000 UT) capsule, Take 2,000 Units by mouth in the morning., Disp: , Rfl:    colchicine 0.6 MG tablet, Take 0.6 mg by mouth daily as needed (gout flare)., Disp: , Rfl:    diclofenac  Sodium (VOLTAREN ) 1 % GEL, Apply 2 g topically 3 (three) times daily as needed (pain.)., Disp: , Rfl:    empagliflozin  (JARDIANCE ) 10 MG TABS tablet, Take 1 tablet (10 mg total) by mouth daily., Disp: 30 tablet, Rfl: 6   famotidine  (PEPCID ) 20 MG tablet, Take 20 mg by mouth as needed for heartburn or indigestion., Disp: , Rfl:    fluticasone  furoate-vilanterol (BREO ELLIPTA ) 200-25 MCG/ACT AEPB, Inhale 1 puff into the lungs daily., Disp: 60 each, Rfl: 11   gabapentin  (NEURONTIN ) 100 MG capsule, Take 100 mg by mouth daily as needed (Pain)., Disp: , Rfl:    ipratropium-albuterol  (DUONEB) 0.5-2.5 (3) MG/3ML SOLN, INHALE THE CONTENTS OF 1 VIAL VIA NEBULIZER EVERY 6 HOURS AS NEEDED FOR WHEEZING OR SHORTNESS OF BREATH, Disp: 1080 mL, Rfl: 2   losartan  (COZAAR ) 25 MG tablet, Take 1 tablet (25 mg total) by mouth daily., Disp: 90 tablet, Rfl: 3   macitentan  (OPSUMIT ) 10 MG tablet, Take 1 tablet (10 mg total) by mouth daily., Disp: 90 tablet, Rfl: 3   OXYGEN, Inhale 4-8 L into the lungs continuous., Disp: , Rfl:    pantoprazole  (PROTONIX ) 40 MG tablet, Take 1 tablet (40 mg total) by mouth daily., Disp: 30 tablet, Rfl: 11   potassium chloride  SA (KLOR-CON  M) 20 MEQ tablet, Take 1 tablet (20 mEq total) by mouth daily., Disp: 90 tablet, Rfl: 3   Riociguat  (ADEMPAS ) 2 MG TABS, Take 1 mg by mouth in the morning, at noon, and at bedtime., Disp: , Rfl:    Selexipag  (UPTRAVI ) 800  MCG TABS, Take 1 tablet (800 mcg total) by mouth 2 (two) times daily., Disp: 60 tablet, Rfl: 0    sertraline  (ZOLOFT ) 50 MG tablet, Take 1 tablet (50 mg total) by mouth 2 (two) times daily., Disp: 60 tablet, Rfl: 0   spironolactone  (ALDACTONE ) 25 MG tablet, TAKE 1/2 TABLET(12.5 MG) BY MOUTH DAILY, Disp: 45 tablet, Rfl: 3   torsemide  (DEMADEX ) 20 MG tablet, Take 2 tablets (40 mg total) by mouth daily., Disp: 60 tablet, Rfl: 6   ZEPBOUND  10 MG/0.5ML Pen, ADMINISTER 10 MG UNDER THE SKIN 1 TIME A WEEK, Disp: 2 mL, Rfl: 0 [2]  Social History Tobacco Use  Smoking Status Never  Smokeless Tobacco Never

## 2024-11-20 ENCOUNTER — Telehealth (HOSPITAL_COMMUNITY): Payer: Self-pay

## 2024-11-20 ENCOUNTER — Other Ambulatory Visit (HOSPITAL_COMMUNITY): Payer: Self-pay

## 2024-11-20 ENCOUNTER — Encounter (HOSPITAL_COMMUNITY)
Admission: RE | Admit: 2024-11-20 | Discharge: 2024-11-20 | Disposition: A | Discharge status: Home / Self Care | Source: Ambulatory Visit | Attending: Pulmonary Disease | Admitting: Pulmonary Disease

## 2024-11-20 VITALS — Wt 215.8 lb

## 2024-11-20 DIAGNOSIS — I2729 Other secondary pulmonary hypertension: Secondary | ICD-10-CM | POA: Diagnosis not present

## 2024-11-20 NOTE — Telephone Encounter (Signed)
 Advanced Heart Failure Triage Encounter  Patient Name: Dawn Fowler  Date of Call: 11/20/24  Problem:  Patients Torsemide  was increased to 60 mg daily on 11/07/24. Patient states she needs Torsemide  decreased back to 40 mg daily states she is having too many accidents on herself when she is out.  Plan:  Sent message to provider for further review.  Bellatrix Devonshire N Diann Bangerter, CMA

## 2024-11-20 NOTE — Telephone Encounter (Signed)
 Notified patient. Patient wants to know if she can do Torsemide  20 mg in the morning and Torsemide  40 mg at night.

## 2024-11-20 NOTE — Progress Notes (Signed)
 Daily Session Note  Patient Details  Name: Dawn Fowler MRN: [id number] Date of Birth: June 22, 1966 Referring Provider:   Conrad Ports Pulmonary Rehab Walk Test from 08/22/2024 in [facility]  Referring Provider [name]    Encounter Date: 11/20/2024  Check In:  Session Check In - 11/20/24 1036       Check-In   Supervising physician immediately available to respond to emergencies [facility] MD immediately available    Physician(s) Barnie Press, NP    Location [facility]    Staff Present Ronal Levin, RN, BSN;Randi Midge HECKLE, ACSM-CEP, Exercise Physiologist;Casey Claudene Neita Moats, MS, ACSM-CEP, Exercise Physiologist    Virtual Visit No    Medication changes reported     No    Fall or balance concerns reported    Yes    Comments pt fell on 11/06/24 at home    Tobacco Cessation No Change    Warm-up and Cool-down Performed as group-led instruction    Resistance Training Performed Yes    VAD Patient? No    PAD/SET Patient? No      Pain Assessment   Currently in Pain? No/denies    Multiple Pain Sites No          Capillary Blood Glucose: No results found for this or any previous visit (from the past 24 hours).    Tobacco Use History[1]  Goals Met:  Proper associated with RPD/PD & O2 Sat Exercise tolerated well No report of concerns or symptoms today Strength training completed today  Goals Unmet:  Not Applicable  Comments: Service time is from 1024 to 1142.    Dr. Slater [name] is Medical Director for Pulmonary Rehab at [facility].     [1]  Social History Tobacco Use  Smoking Status Never  Smokeless Tobacco Never

## 2024-11-20 NOTE — Telephone Encounter (Signed)
 Patient notified

## 2024-11-21 MED ORDER — ZEPBOUND 10 MG/0.5ML ~~LOC~~ SOAJ: 10.0000 mg | SUBCUTANEOUS | 0 refills | Status: DC

## 2024-11-25 ENCOUNTER — Telehealth (HOSPITAL_COMMUNITY): Payer: Self-pay | Admitting: *Deleted

## 2024-11-25 ENCOUNTER — Telehealth: Payer: Self-pay | Admitting: *Deleted

## 2024-11-25 ENCOUNTER — Encounter (HOSPITAL_COMMUNITY)

## 2024-11-25 NOTE — Telephone Encounter (Signed)
 Pt LVM stating she would not make it to class today due to the icy road conditions. Aliene Aris BS, ACSM-CEP 11/25/2024 10:50 AM

## 2024-11-25 NOTE — Telephone Encounter (Signed)
 The patient has been notified of the result and verbalized understanding.  All questions (if any) were answered. Dawn Fowler, CMA 11/25/2024 5:18 PM    Patient states she is already doing a HST called [employer] diagnostic for Slater Acquanetta Staff (pulmonary). I am not sure how she is seeing two sleep specialist at the same time.

## 2024-11-25 NOTE — Telephone Encounter (Signed)
-----   Message from Wilbert Bihari, MD sent at 10/28/2024  4:28 PM EST ----- Please disregard initial note for normal sleep study.  Her sleep study is non diagnostic due to insufficient sleep time and no REM sleep.  I would like her to have an Itamar HST on her current O2 dose at home.

## 2024-11-26 ENCOUNTER — Telehealth (HOSPITAL_BASED_OUTPATIENT_CLINIC_OR_DEPARTMENT_OTHER): Payer: Self-pay

## 2024-11-26 ENCOUNTER — Other Ambulatory Visit

## 2024-11-26 MED ORDER — ZEPBOUND 10 MG/0.5ML ~~LOC~~ SOAJ: 10.0000 mg | SUBCUTANEOUS | 0 refills | Status: AC

## 2024-11-26 NOTE — Addendum Note (Signed)
 Addended by: DARRELL BAREFOOT on: 11/26/2024 12:00 PM   Modules accepted: Orders

## 2024-11-26 NOTE — Telephone Encounter (Signed)
 Rx sent to local pharmacy. Pt can restart once she picks up medication

## 2024-11-26 NOTE — Therapy (Signed)
 Patient Name: Dawn Fowler MRN: [id number] DOB:[birth year], 59 y.o., female Today's Date: 11/26/2024  To whom this may concern,  Patient was recently evaluated on 09/24/24 for power mobility assessment. During the assessment, Group 2 power wheelchair with power tilt accessory was recommended for patient considering her LE edema, decreased respiratory function and provide rest breaks.  Per careers adviser, patient does not qualify power tilt function on group 2 power wheelchair. Therefore, I am making recommendation that a standard group 2 power wheelchair should be qualified for patient to improve her independent mobility within her home and reduce fall risk.    [name], PT 11/26/2024, 10:10 AM

## 2024-11-26 NOTE — Telephone Encounter (Signed)
 CMN received for wheelchair [facility] to [facility] signed by provider and faxed confirmation received

## 2024-11-26 NOTE — Telephone Encounter (Signed)
 Patient called to report she has not received zepbound  dose for this week. Reports she requested refill last week   Advised medication was sent to mail order  Pt requests that medication be sent to [facility]- [facility]   Patient also questioned if she should just jump into this dosage since there has been a delay

## 2024-11-27 ENCOUNTER — Telehealth (HOSPITAL_COMMUNITY): Payer: Self-pay

## 2024-11-27 ENCOUNTER — Encounter (HOSPITAL_COMMUNITY)

## 2024-11-27 NOTE — Telephone Encounter (Signed)
 Called pt to inform her that our elevator is broken. She is unable to take the stairs and wanted to cancel today's session.

## 2024-11-28 ENCOUNTER — Ambulatory Visit (HOSPITAL_COMMUNITY)
Admission: RE | Admit: 2024-11-28 | Discharge: 2024-11-28 | Disposition: A | Source: Ambulatory Visit | Attending: Family Medicine

## 2024-11-28 ENCOUNTER — Ambulatory Visit (HOSPITAL_COMMUNITY)
Admission: RE | Admit: 2024-11-28 | Discharge: 2024-11-28 | Disposition: A | Source: Ambulatory Visit | Attending: Family Medicine | Admitting: Family Medicine

## 2024-11-28 DIAGNOSIS — R1310 Dysphagia, unspecified: Secondary | ICD-10-CM | POA: Diagnosis present

## 2024-11-28 DIAGNOSIS — R1319 Other dysphagia: Secondary | ICD-10-CM | POA: Diagnosis not present

## 2024-11-28 DIAGNOSIS — R1312 Dysphagia, oropharyngeal phase: Secondary | ICD-10-CM

## 2024-11-28 NOTE — Procedures (Addendum)
 " Modified Barium Swallow Study  Patient Details  Name: Dawn Fowler MRN: [id number] Date of Birth: [birth year]  Today's Date: 11/28/2024  History of Present Illness Dawn Fowler is a 59 yo female who presented today for an outpatient MBSS per referal of her pulmonologist. 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 Pt does not have a hx of smoking.    Clinical Impression  Pt presents with a functional oropharyngeal swallow per MBSS completed today. No penetration or aspiration observed across trials. Oral transit observed to be prompt with no anterior or posterior spillage observed. Pharyngeal swallow initiation was timely with complete epiglottic inversion and adequate pharyngeal clearance.   There was retention of thin liquids and pudding in the distal esophagus observed during esophageal sweep. This matches pt's reported symptoms of globus sensation and regurgitation. There was some backflow observed, though remained well below the UES.   Recommend continue regular diet and thin liquids as tolerated with reflux precautions. We discussed follow with GI for further esophageal assessment as indicated. SLP reviewed alginate-based therapies such as Reflux Gourmet and RefluxRaft. No SLP follow up is indicated at this time.  Factors that may increase risk of adverse event in presence of aspiration Noe & Lianne 2021): Factors that may increase risk of adverse event in presence of aspiration Noe & Lianne 2021): Respiratory or GI disease   Recommendations/Plan: Swallowing Evaluation Recommendations Swallowing Evaluation Recommendations Recommendations: PO diet PO Diet Recommendation: Regular Liquid Administration via: Cup; Straw Medication Administration: -- (as tolerated) Supervision: Patient able to self-feed Swallowing strategies  : Small bites/sips; Slow  rate Postural changes: Position pt fully upright for meals; Stay upright 30-60 min after meals Oral care recommendations: Oral care BID (2x/day) Recommended consults: Consider GI consultation; Consider esophageal assessment    Treatment Plan Treatment Plan Treatment recommendations: Therapy as outlined in treatment plan below Follow-up recommendations: No SLP follow up Functional status assessment: Patient has not had a recent decline in their functional status.     Recommendations Recommendations for follow up therapy are one component of a multi-disciplinary discharge planning process, led by the attending physician.  Recommendations may be updated based on patient status, additional functional criteria and insurance authorization.  Assessment: Orofacial Exam: Orofacial Exam Oral Cavity: Oral Hygiene: WFL Oral Cavity - Dentition: Missing dentition; Adequate natural dentition Orofacial Anatomy: WFL Oral Motor/Sensory Function: WFL    Anatomy:  No data recorded  Boluses Administered: Boluses Administered Boluses Administered: Thin liquids (Level 0); Mildly thick liquids (Level 2, nectar thick); Puree; Solid     Oral Impairment Domain: Oral Impairment Domain Lip Closure: No labial escape Tongue control during bolus hold: Cohesive bolus between tongue to palatal seal Bolus preparation/mastication: Timely and efficient chewing and mashing Bolus transport/lingual motion: Brisk tongue motion Oral residue: Complete oral clearance Location of oral residue : N/A Initiation of pharyngeal swallow : Posterior angle of the ramus     Pharyngeal Impairment Domain: Pharyngeal Impairment Domain Soft palate elevation: No bolus between soft palate (SP)/pharyngeal wall (PW) Laryngeal elevation: Complete superior movement of thyroid cartilage with complete approximation of arytenoids to epiglottic petiole Anterior hyoid excursion: Complete anterior movement Epiglottic movement:  Complete inversion Laryngeal vestibule closure: Complete, no air/contrast in laryngeal vestibule Pharyngeal stripping wave : Present - complete Pharyngeal contraction (A/P view only): N/A Pharyngoesophageal segment opening: Complete distension and complete duration, no obstruction of flow Tongue base retraction: No contrast between tongue base and  posterior pharyngeal wall (PPW) Pharyngeal residue: Complete pharyngeal clearance Location of pharyngeal residue: N/A     Esophageal Impairment Domain: Esophageal Impairment Domain Esophageal clearance upright position: Esophageal retention with retrograde flow below pharyngoesophageal segment (PES)    Pill: Pill Consistency administered: Puree Puree: WFL    Penetration/Aspiration Scale Score: Penetration/Aspiration Scale Score 1.  Material does not enter airway: Thin liquids (Level 0); Mildly thick liquids (Level 2, nectar thick); Puree; Solid; Pill    Compensatory Strategies: Compensatory Strategies Compensatory strategies: No       General Information: Caregiver present: No   Diet Prior to this Study: Regular; Thin liquids (Level 0)    Temperature : Normal    Respiratory Status: WFL    Supplemental O2: High flow nasal cannula (4L)    History of Recent Intubation: No   Behavior/Cognition: Alert; Cooperative; Pleasant mood  Self-Feeding Abilities: Able to self-feed  Baseline vocal quality/speech: Normal  Volitional Cough: Able to elicit  Volitional Swallow: Able to elicit  Exam Limitations: No limitations   Goal Planning: Prognosis for improved oropharyngeal function: Good  No data recorded No data recorded No data recorded Consulted and agree with results and recommendations: Patient   Pain: Pain Assessment Pain Assessment: No/denies pain    End of Session: Start Time:SLP Start Time (ACUTE ONLY): 1155  Stop Time: SLP Stop Time (ACUTE ONLY): 1220  Time Calculation:SLP Time Calculation (min)  (ACUTE ONLY): 25 min  Charges: SLP Evaluations $ SLP Speech Visit: 1 Visit  SLP Evaluations $MBS Swallow: 1 Procedure   SLP visit diagnosis: SLP Visit Diagnosis: Other (comment) (primary esophageal dysphagia)    Past Medical History:  Past Medical History:  Diagnosis Date   Acute respiratory failure due to COVID-19 (HCC) 10/30/2019   Anxiety    Arthritis    CHF (congestive heart failure) (HCC)    CKD (chronic kidney disease), stage III (HCC)    Complication of anesthesia    hard to wake up after general anesthesia - had trouble waking up after succinylcholine (consider possibility of pseudocholinesterase deficiency)   COVID-19 virus detected 11/20/2019   Tested positive for Covid in December/2020 Required hospitalization in December/2020 through January/2021 Received Redemsivir   Depression    DVT (deep venous thrombosis) (HCC) 06/09/2020   RLE   Dyspnea    Family history of adverse reaction to anesthesia    hard to wake up after general anesthesia   GERD (gastroesophageal reflux disease)    HLD (hyperlipidemia)    HTN (hypertension)    Pre-diabetes    no meds   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; 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, although unsure if she had confirmation testing done   Pulmonary HTN (HCC)    Sleep apnea    on CPAP   Trichomonas infection    Past Surgical History:  Past Surgical History:  Procedure Laterality Date   BREAST REDUCTION SURGERY Bilateral 12/20/2021   Procedure: bilateral breast reduction with free nipple graft;  Surgeon: Marene Sieving, MD;  Location: [facility] OR;  Service: Plastics;  Laterality: Bilateral;  4 hours   CARPAL TUNNEL RELEASE Bilateral    CESAREAN SECTION     COLONOSCOPY WITH PROPOFOL  N/A 03/12/2020   Procedure: COLONOSCOPY WITH PROPOFOL ;  Surgeon: Rollin Dover, MD;  Location: [facility];  Service: Endoscopy;  Laterality: N/A;   DILATATION & CURETTAGE/HYSTEROSCOPY  WITH MYOSURE N/A 04/08/2020   Procedure: DILATATION & CURETTAGE/HYSTEROSCOPY WITH MYOSURE;  Surgeon: Lavoie, Marie-Lyne, MD;  Location: [facility]  OR;  Service: Gynecology;  Laterality: N/A;  request 8:30am OR start-requested through [employer] for [facility] Gyn requests one hour   HEMOSTASIS CLIP PLACEMENT  03/12/2020   Procedure: HEMOSTASIS CLIP PLACEMENT;  Surgeon: Rollin Dover, MD;  Location: [facility];  Service: Endoscopy;;   LEG SURGERY Right 1988   metal rod   lumbar surgery     POLYPECTOMY  03/12/2020   Procedure: POLYPECTOMY;  Surgeon: Rollin Dover, MD;  Location: [facility];  Service: Endoscopy;;   RIGHT HEART CATH N/A 12/18/2019   Procedure: RIGHT HEART CATH;  Surgeon: Rolan Ezra RAMAN, MD;  Location: [facility] INVASIVE CV LAB;  Service: Cardiovascular;  Laterality: N/A;   RIGHT HEART CATH N/A 07/12/2020   Procedure: RIGHT HEART CATH;  Surgeon: Rolan Ezra RAMAN, MD;  Location: [facility] INVASIVE CV LAB;  Service: Cardiovascular;  Laterality: N/A;   RIGHT HEART CATH N/A 07/18/2024   Procedure: RIGHT HEART CATH;  Surgeon: Rolan Ezra RAMAN, MD;  Location: [facility] INVASIVE CV LAB;  Service: Cardiovascular;  Laterality: N/A;    Dawn Fowler 11/28/2024, 2:08 PM       Dawn Fowler 11/28/2024,1:14 PM  "

## 2024-12-01 ENCOUNTER — Ambulatory Visit (HOSPITAL_BASED_OUTPATIENT_CLINIC_OR_DEPARTMENT_OTHER): Payer: Self-pay | Admitting: Pulmonary Disease

## 2024-12-01 DIAGNOSIS — K222 Esophageal obstruction: Secondary | ICD-10-CM

## 2024-12-02 ENCOUNTER — Encounter (HOSPITAL_COMMUNITY)

## 2024-12-04 ENCOUNTER — Encounter (HOSPITAL_COMMUNITY)

## 2024-12-09 ENCOUNTER — Encounter (HOSPITAL_COMMUNITY)

## 2024-12-10 ENCOUNTER — Other Ambulatory Visit

## 2024-12-11 ENCOUNTER — Encounter (HOSPITAL_COMMUNITY)

## 2024-12-16 ENCOUNTER — Encounter (HOSPITAL_COMMUNITY)

## 2024-12-18 ENCOUNTER — Encounter (HOSPITAL_COMMUNITY)

## 2024-12-23 ENCOUNTER — Encounter (HOSPITAL_COMMUNITY)

## 2024-12-25 ENCOUNTER — Encounter (HOSPITAL_COMMUNITY)

## 2024-12-25 ENCOUNTER — Ambulatory Visit (HOSPITAL_COMMUNITY): Admitting: Cardiology

## 2024-12-30 ENCOUNTER — Encounter (HOSPITAL_COMMUNITY)

## 2025-01-01 ENCOUNTER — Encounter (HOSPITAL_COMMUNITY)

## 2025-01-06 ENCOUNTER — Encounter (HOSPITAL_COMMUNITY)

## 2025-01-06 ENCOUNTER — Ambulatory Visit (HOSPITAL_BASED_OUTPATIENT_CLINIC_OR_DEPARTMENT_OTHER): Admitting: Pulmonary Disease

## 2025-01-08 ENCOUNTER — Encounter (HOSPITAL_COMMUNITY)

## 2025-01-13 ENCOUNTER — Encounter (HOSPITAL_COMMUNITY)
